# Patient Record
Sex: Female | Born: 1935
Health system: Southern US, Community
[De-identification: ages and names within clinical notes are randomized; demographics above are authoritative.]

## PROBLEM LIST (undated history)

## (undated) DIAGNOSIS — I4891 Unspecified atrial fibrillation: Secondary | ICD-10-CM

## (undated) DIAGNOSIS — M159 Polyosteoarthritis, unspecified: Secondary | ICD-10-CM

## (undated) DIAGNOSIS — F329 Major depressive disorder, single episode, unspecified: Secondary | ICD-10-CM

## (undated) DIAGNOSIS — M069 Rheumatoid arthritis, unspecified: Secondary | ICD-10-CM

## (undated) DIAGNOSIS — K8020 Calculus of gallbladder without cholecystitis without obstruction: Secondary | ICD-10-CM

## (undated) DIAGNOSIS — I10 Essential (primary) hypertension: Secondary | ICD-10-CM

## (undated) DIAGNOSIS — R9431 Abnormal electrocardiogram [ECG] [EKG]: Secondary | ICD-10-CM

## (undated) DIAGNOSIS — I350 Nonrheumatic aortic (valve) stenosis: Secondary | ICD-10-CM

## (undated) DIAGNOSIS — J189 Pneumonia, unspecified organism: Secondary | ICD-10-CM

## (undated) DIAGNOSIS — R7301 Impaired fasting glucose: Secondary | ICD-10-CM

## (undated) DIAGNOSIS — E785 Hyperlipidemia, unspecified: Secondary | ICD-10-CM

## (undated) DIAGNOSIS — R079 Chest pain, unspecified: Secondary | ICD-10-CM

## (undated) DIAGNOSIS — R609 Edema, unspecified: Secondary | ICD-10-CM

## (undated) DIAGNOSIS — F0390 Unspecified dementia without behavioral disturbance: Secondary | ICD-10-CM

## (undated) DIAGNOSIS — R42 Dizziness and giddiness: Secondary | ICD-10-CM

## (undated) HISTORY — DX: Nonrheumatic aortic (valve) stenosis: I35.0

## (undated) HISTORY — DX: Impaired fasting glucose: R73.01

## (undated) HISTORY — DX: Polyosteoarthritis, unspecified: M15.9

## (undated) HISTORY — PX: CHOLECYSTECTOMY: SHX55

## (undated) HISTORY — DX: Hyperlipidemia, unspecified: E78.5

## (undated) HISTORY — PX: BUNIONECTOMY: SHX129

## (undated) HISTORY — PX: OTHER SURGICAL HISTORY: SHX169

## (undated) HISTORY — DX: Essential (primary) hypertension: I10

## (undated) HISTORY — DX: Abnormal electrocardiogram (ECG) (EKG): R94.31

## (undated) HISTORY — DX: Unspecified atrial fibrillation: I48.91

## (undated) HISTORY — DX: Dizziness and giddiness: R42

## (undated) HISTORY — PX: APPENDECTOMY: SHX54

## (undated) HISTORY — DX: Edema, unspecified: R60.9

## (undated) HISTORY — DX: Chest pain, unspecified: R07.9

## (undated) HISTORY — PX: TONSILLECTOMY: SUR1361

---
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
## 1997-06-08 ENCOUNTER — Ambulatory Visit (HOSPITAL_COMMUNITY): Admission: RE | Admit: 1997-06-08 | Discharge: 1997-06-08 | Payer: Self-pay | Admitting: Family Medicine

## 1998-06-02 ENCOUNTER — Other Ambulatory Visit: Admission: RE | Admit: 1998-06-02 | Discharge: 1998-06-02 | Payer: Self-pay | Admitting: Family Medicine

## 1998-06-21 ENCOUNTER — Ambulatory Visit (HOSPITAL_COMMUNITY): Admission: RE | Admit: 1998-06-21 | Discharge: 1998-06-21 | Payer: Self-pay | Admitting: Family Medicine

## 1998-06-21 ENCOUNTER — Encounter: Payer: Self-pay | Admitting: Family Medicine

## 1999-06-22 ENCOUNTER — Encounter: Payer: Self-pay | Admitting: Family Medicine

## 1999-06-22 ENCOUNTER — Ambulatory Visit (HOSPITAL_COMMUNITY): Admission: RE | Admit: 1999-06-22 | Discharge: 1999-06-22 | Payer: Self-pay | Admitting: Family Medicine

## 1999-08-11 ENCOUNTER — Emergency Department (HOSPITAL_COMMUNITY): Admission: EM | Admit: 1999-08-11 | Discharge: 1999-08-11 | Payer: Self-pay | Admitting: *Deleted

## 1999-08-15 ENCOUNTER — Other Ambulatory Visit: Admission: RE | Admit: 1999-08-15 | Discharge: 1999-08-15 | Payer: Self-pay | Admitting: Family Medicine

## 2001-08-25 ENCOUNTER — Encounter: Payer: Self-pay | Admitting: Internal Medicine

## 2001-08-25 ENCOUNTER — Ambulatory Visit (HOSPITAL_COMMUNITY): Admission: RE | Admit: 2001-08-25 | Discharge: 2001-08-25 | Payer: Self-pay | Admitting: Internal Medicine

## 2002-10-09 ENCOUNTER — Encounter: Payer: Self-pay | Admitting: Internal Medicine

## 2002-10-09 ENCOUNTER — Ambulatory Visit (HOSPITAL_COMMUNITY): Admission: RE | Admit: 2002-10-09 | Discharge: 2002-10-09 | Payer: Self-pay | Admitting: Internal Medicine

## 2003-03-11 ENCOUNTER — Emergency Department (HOSPITAL_COMMUNITY): Admission: EM | Admit: 2003-03-11 | Discharge: 2003-03-11 | Payer: Self-pay | Admitting: Emergency Medicine

## 2003-10-11 ENCOUNTER — Ambulatory Visit (HOSPITAL_COMMUNITY): Admission: RE | Admit: 2003-10-11 | Discharge: 2003-10-11 | Payer: Self-pay | Admitting: Internal Medicine

## 2004-11-03 ENCOUNTER — Ambulatory Visit (HOSPITAL_COMMUNITY): Admission: RE | Admit: 2004-11-03 | Discharge: 2004-11-03 | Payer: Self-pay | Admitting: Internal Medicine

## 2005-11-14 ENCOUNTER — Ambulatory Visit (HOSPITAL_COMMUNITY): Admission: RE | Admit: 2005-11-14 | Discharge: 2005-11-14 | Payer: Self-pay | Admitting: Internal Medicine

## 2006-12-18 ENCOUNTER — Ambulatory Visit (HOSPITAL_COMMUNITY): Admission: RE | Admit: 2006-12-18 | Discharge: 2006-12-18 | Payer: Self-pay | Admitting: Internal Medicine

## 2007-01-24 ENCOUNTER — Encounter: Admission: RE | Admit: 2007-01-24 | Discharge: 2007-01-24 | Payer: Self-pay | Admitting: Internal Medicine

## 2007-02-05 ENCOUNTER — Ambulatory Visit: Payer: Self-pay | Admitting: Vascular Surgery

## 2007-04-03 ENCOUNTER — Inpatient Hospital Stay (HOSPITAL_COMMUNITY): Admission: EM | Admit: 2007-04-03 | Discharge: 2007-04-04 | Payer: Self-pay | Admitting: Emergency Medicine

## 2007-12-22 ENCOUNTER — Ambulatory Visit (HOSPITAL_COMMUNITY): Admission: RE | Admit: 2007-12-22 | Discharge: 2007-12-22 | Payer: Self-pay | Admitting: Internal Medicine

## 2009-01-12 ENCOUNTER — Ambulatory Visit (HOSPITAL_COMMUNITY): Admission: RE | Admit: 2009-01-12 | Discharge: 2009-01-12 | Payer: Self-pay | Admitting: Pediatrics

## 2010-01-13 ENCOUNTER — Ambulatory Visit (HOSPITAL_COMMUNITY)
Admission: RE | Admit: 2010-01-13 | Discharge: 2010-01-13 | Payer: Self-pay | Source: Home / Self Care | Admitting: Internal Medicine

## 2010-07-04 NOTE — H&P (Signed)
NAMEMAKINI, DEMAINE NO.:  [id number]   MEDICAL RECORD NO.:  [id number]          PATIENT TYPE:  INP   LOCATION:  [id number]                         FACILITY:  [facility]   PHYSICIAN:  Bartholomew Boards, MD      DATE OF BIRTH:  1928-12-07   DATE OF ADMISSION:  04/02/2007  DATE OF DISCHARGE:                              HISTORY & PHYSICAL   PRIMARY CARE PHYSICIAN:  Merrilee Seashore.   ADMITTING SERVICE:  [employer] [facility] Team [facility]   CHIEF COMPLAINT:  Chest pain.   HISTORY OF PRESENT ILLNESS:  Ms. Coverdale is a 77 year old African American  female with history of hypertension who presents to the [facility]  Emergency Department this morning complaining of chest pain.  She  reports the pain is a pressure-like pain of her left anterior chest.  She reports this is associated with exertion and resolves spontaneously  and at rest.  She says it also has occurred at rest and resolves  spontaneously.  When it does recur, it reports it has been moderate and  not associated with shortness of breath, nausea, vomiting, diaphoresis,  or paresthesia of her extremities.  She currently has no chest pain.  She denies ever having symptoms similar to this previously.  Furthermore, she denies having had any history of heart disease to her  knowledge.  Additionally, she denies fever, chills, dysuria, or cough  but she reports she has had left lower extremity edema for over 4 weeks  now since having broken out with a rash in her left leg that has been  followed by dermatology.  Currently, she has no rash but continued  edema.   PAST MEDICAL HISTORY:  1. Hypertension.  2. Gallstones.   FAMILY HISTORY:  Positive for hypertension.  Negative for coronary  artery disease.   SOCIAL HISTORY:  Patient lives alone in [place], [place].  Denies tobacco, alcohol, or drug use.   ALLERGIES:  CODEINE.   MEDICATIONS:  1. Hydrochlorothiazide 25 mg p.o. daily.  2. Fish oil.  3.  Os-Cal.  4. Aspirin 81 mg daily.   REVIEW OF SYSTEMS:  As per history of present illness.  Also, a 14-point  review of systems was obtained and negative.   PHYSICAL EXAM:  VITAL SIGNS:  Temperature 97.2.  Pulse 87.  Respirations  18.  Blood pressure 134/32.  GENERAL:  Well-nourished, well-developed [place] American female.  Appears stated age, no acute distress.  HEENT:  Extraocular movements intact.  Oropharynx clear.  Normocephalic,  atraumatic.  NECK:  Supple without jugular venous distention.  CHEST:  Clear to auscultation bilaterally without rales, rhonchi, or  wheeze.  HEART:  Regular rate and rhythm with systolic ejection murmur but no  rubs or gallops appreciated.  ABDOMEN:  Obese, nontender.  Positive bowel sounds.  No  hepatosplenomegaly.  No masses appreciated.  EXTREMITIES:  One-plus left lower extremity pretibial edema without  wound visualized on the left lower extremity.  No right lower extremity,  no other edema, normal range of motion.  SKIN:  No rashes apparent on exam.  NEUROLOGIC:  Grossly intact.  LABS AND STUDIES:  EKG shows normal sinus rhythm at 84 with occasional  PACs, but no ST or T-wave changes.   Chest x-ray shows shallow inspirations with left greater than right  bibasilar opacities concerning for atelectasis versus aspiration versus  pneumonia.  Chest x-ray shows cardiomegaly without pulmonary edema.  Metabolic panel and complete blood count blood tests are within normal  limits.  Troponin I is less than 0.05.   IMPRESSION:  A 77 year old African American female with chest pain.   PLAN:  We will admit the patient to [facility].  We will place on telemetry bed.  Rule out myocardial infarction with  serial cardiac enzymes.  Treat with primary prevention with baby  aspirin.  Check fasting lipid panel.  Repeat labs later this morning.  We will control blood pressure with her home hydrochlorothiazide dose  and add additional  medications as indicated.      Bartholomew Boards, MD  Electronically Signed     EWR/MEDQ  D:  04/03/2007  T:  04/04/2007  Job:  [id number]

## 2010-07-04 NOTE — Discharge Summary (Signed)
[name] [name], [name] [name]                ACCOUNT NO.:  [id number]   MEDICAL RECORD NO.:  [id number]          PATIENT TYPE:  INP   LOCATION:  [id number]                         FACILITY:  [facility]   PHYSICIAN:  Rexene Alberts, M.D.    DATE OF BIRTH:  07/04/1928   DATE OF ADMISSION:  04/02/2007  DATE OF DISCHARGE:  04/04/2007                               DISCHARGE SUMMARY   DISCHARGE DIAGNOSES:  1. Atypical chest pain.  Myocardial infarction ruled out.  2. Sinus bradycardia with a heart rate of 54 beats per minute.  3. Hypertension.   DISCHARGE MEDICATIONS:  1. Hydrochlorothiazide 25 mg daily.  2. Fish oil capsule once or twice daily.  3. Os-Cal 500 mg b.i.d.  4. Aspirin 81 mg daily.   DISCHARGE DISPOSITION:  The patient is being discharged to home in  improved and stable condition.  She was advised to follow up with her  primary care physician, Dr. Ashby Dawes, in 1 week.   CONSULTATIONS:  None.   PROCEDURE PERFORMED:  Chest x-ray on April 03, 2007.  The results  revealed shallow lung volumes with bibasilar opacity, left greater than  right.  Cardiomegaly without acute pulmonary edema.   HISTORY OF PRESENT ILLNESS:  The patient is a 77 year old woman with a  past medical history significant for hypertension who presented to the  emergency department on January 31, 2008 with a chief complaint of  chest pain.  The chest pain was not associated with shortness of breath,  nausea, vomiting, diaphoresis, or paresthesias of her extremities.  When  she was evaluated in the emergency department she had become chest pain  free.  Her EKG on admission revealed normal sinus rhythm with PAC and a  heart rate of 84 beats per minute.  Her initial cardiac markers were  negative.  The patient, however, was admitted for further evaluation and  management.   For additional details, please see the dictated history and physical.   HOSPITAL COURSE:  Problem 1.  CHEST PAIN.  The the patient was continued  on hydrochlorothiazide and aspirin.  Pain management was started with as-  needed Tylenol and as-needed morphine.  Prophylactic Protonix and  Lovenox were given as well.  Cardiac enzymes were ordered every 8 hours  x24 hours for further evaluation.  The patient's cardiac enzymes were  completely within normal limits during the hospital course.  A followup  EKG was ordered as well and revealed sinus bradycardia with a heart rate  of 54 beats per minute but no ST or T-wave abnormalities.  Her thyroid  function and fasting lipid panel were assessed as well.  The TSH was  within normal limits at 4.8.  Her fasting lipid panel revealed a total  cholesterol of 179, HDL of 46, LDL of 117, and triglycerides of 79.   The patient experienced no chest pain during the entire hospitalization.  I discussed her disposition with her primary care physician Dr.  Ashby Dawes.  I advised further evaluation with an outpatient  Cardiolite or Myoveiw stress test,  given that the patient is now asymptomatic and her cardiac enzymes are  normal.  He agreed.  I discussed this with the patient and the patient's  family, and they are all in agreement with further outpatient  evaluation.  The patient was advised to continue her chronic medications  including hydrochlorothiazide, fish oil, and aspirin.      Rexene Alberts, M.D.  Electronically Signed     DF/MEDQ  D:  04/04/2007  T:  04/06/2007  Job:  [id number]   cc:   Merrilee Seashore, M.D.  Fax: [phone]

## 2010-11-10 LAB — BASIC METABOLIC PANEL
BUN: 21
CO2: 28
Calcium: 8.9
Chloride: 98
Creatinine, Ser: 0.88
GFR calc Af Amer: >60
GFR calc non Af Amer: >60
Glucose, Bld: 117 — ABNORMAL HIGH
Potassium: 3.9
Sodium: 133 — ABNORMAL LOW

## 2010-11-10 LAB — TROPONIN I: Troponin I: 0.04

## 2010-11-10 LAB — COMPREHENSIVE METABOLIC PANEL
ALT: 15
AST: 21
Albumin: 3 — ABNORMAL LOW
Alkaline Phosphatase: 35 — ABNORMAL LOW
BUN: 17
CO2: 30
Calcium: 9.1
Chloride: 102
Creatinine, Ser: 0.97
GFR calc Af Amer: >60
GFR calc non Af Amer: 56 — ABNORMAL LOW
Glucose, Bld: 115 — ABNORMAL HIGH
Potassium: 4.1
Sodium: 138
Total Bilirubin: 0.6
Total Protein: 6.8

## 2010-11-10 LAB — CBC
HCT: 31.9 — ABNORMAL LOW
HCT: 32.6 — ABNORMAL LOW
Hemoglobin: 11.1 — ABNORMAL LOW
Hemoglobin: 11.4 — ABNORMAL LOW
MCHC: 34.9
MCHC: 34.9
MCV: 90.9
MCV: 91.3
Platelets: 291
Platelets: 301
RBC: 3.51 — ABNORMAL LOW
RBC: 3.57 — ABNORMAL LOW
RDW: 13.5
RDW: 13.6
WBC: 5.8
WBC: 7.4

## 2010-11-10 LAB — DIFFERENTIAL
Basophils Absolute: 0
Basophils Relative: 0
Eosinophils Absolute: 0.2
Eosinophils Relative: 3
Lymphocytes Relative: 25
Lymphs Abs: 1.9
Monocytes Absolute: 0.8
Monocytes Relative: 10
Neutro Abs: 4.5
Neutrophils Relative %: 61

## 2010-11-10 LAB — CARDIAC PANEL(CRET KIN+CKTOT+MB+TROPI)
CK, MB: 1.9
CK, MB: 2.3
Relative Index: 1
Relative Index: 1.4
Total CK: 159
Total CK: 198 — ABNORMAL HIGH
Troponin I: 0.03
Troponin I: 0.04

## 2010-11-10 LAB — LIPID PANEL
Cholesterol: 179
HDL: 46
LDL Cholesterol: 117 — ABNORMAL HIGH
Total CHOL/HDL Ratio: 3.9
Triglycerides: 79
VLDL: 16

## 2010-11-10 LAB — TSH: TSH: 4.831

## 2010-11-10 LAB — POCT CARDIAC MARKERS
CKMB, poc: 2.6
Myoglobin, poc: 88.5
Operator id: 4531
Troponin i, poc: <0.05

## 2010-11-10 LAB — CK TOTAL AND CKMB (NOT AT ARMC)
CK, MB: 2.8
Relative Index: 1.4
Total CK: 199 — ABNORMAL HIGH

## 2010-11-10 LAB — B-NATRIURETIC PEPTIDE (CONVERTED LAB): Pro B Natriuretic peptide (BNP): <30

## 2010-12-19 ENCOUNTER — Other Ambulatory Visit (HOSPITAL_COMMUNITY): Payer: Self-pay | Admitting: Internal Medicine

## 2010-12-19 DIAGNOSIS — Z1231 Encounter for screening mammogram for malignant neoplasm of breast: Secondary | ICD-10-CM

## 2011-01-17 ENCOUNTER — Ambulatory Visit (HOSPITAL_COMMUNITY)
Admission: RE | Admit: 2011-01-17 | Discharge: 2011-01-17 | Disposition: A | Discharge status: Home / Self Care | Payer: Medicare Other | Source: Ambulatory Visit | Attending: Internal Medicine | Admitting: Internal Medicine

## 2011-01-17 DIAGNOSIS — Z1231 Encounter for screening mammogram for malignant neoplasm of breast: Secondary | ICD-10-CM | POA: Insufficient documentation

## 2011-04-11 DIAGNOSIS — M069 Rheumatoid arthritis, unspecified: Secondary | ICD-10-CM | POA: Diagnosis not present

## 2011-06-05 DIAGNOSIS — R609 Edema, unspecified: Secondary | ICD-10-CM | POA: Diagnosis not present

## 2011-06-05 DIAGNOSIS — I10 Essential (primary) hypertension: Secondary | ICD-10-CM | POA: Diagnosis not present

## 2011-06-05 DIAGNOSIS — E669 Obesity, unspecified: Secondary | ICD-10-CM | POA: Diagnosis not present

## 2011-06-05 DIAGNOSIS — E785 Hyperlipidemia, unspecified: Secondary | ICD-10-CM | POA: Diagnosis not present

## 2011-06-05 DIAGNOSIS — M069 Rheumatoid arthritis, unspecified: Secondary | ICD-10-CM | POA: Diagnosis not present

## 2011-06-12 DIAGNOSIS — E782 Mixed hyperlipidemia: Secondary | ICD-10-CM | POA: Diagnosis not present

## 2011-06-12 DIAGNOSIS — M159 Polyosteoarthritis, unspecified: Secondary | ICD-10-CM | POA: Diagnosis not present

## 2011-06-12 DIAGNOSIS — I359 Nonrheumatic aortic valve disorder, unspecified: Secondary | ICD-10-CM | POA: Diagnosis not present

## 2011-06-12 DIAGNOSIS — H908 Mixed conductive and sensorineural hearing loss, unspecified: Secondary | ICD-10-CM | POA: Diagnosis not present

## 2011-06-12 DIAGNOSIS — I10 Essential (primary) hypertension: Secondary | ICD-10-CM | POA: Diagnosis not present

## 2011-06-12 DIAGNOSIS — R609 Edema, unspecified: Secondary | ICD-10-CM | POA: Diagnosis not present

## 2011-07-31 DIAGNOSIS — M069 Rheumatoid arthritis, unspecified: Secondary | ICD-10-CM | POA: Diagnosis not present

## 2011-07-31 DIAGNOSIS — M79609 Pain in unspecified limb: Secondary | ICD-10-CM | POA: Diagnosis not present

## 2011-07-31 DIAGNOSIS — M159 Polyosteoarthritis, unspecified: Secondary | ICD-10-CM | POA: Diagnosis not present

## 2011-10-30 DIAGNOSIS — M159 Polyosteoarthritis, unspecified: Secondary | ICD-10-CM | POA: Diagnosis not present

## 2011-10-30 DIAGNOSIS — M069 Rheumatoid arthritis, unspecified: Secondary | ICD-10-CM | POA: Diagnosis not present

## 2011-12-10 DIAGNOSIS — M159 Polyosteoarthritis, unspecified: Secondary | ICD-10-CM | POA: Diagnosis not present

## 2011-12-10 DIAGNOSIS — R609 Edema, unspecified: Secondary | ICD-10-CM | POA: Diagnosis not present

## 2011-12-10 DIAGNOSIS — E782 Mixed hyperlipidemia: Secondary | ICD-10-CM | POA: Diagnosis not present

## 2011-12-10 DIAGNOSIS — I10 Essential (primary) hypertension: Secondary | ICD-10-CM | POA: Diagnosis not present

## 2011-12-17 DIAGNOSIS — E782 Mixed hyperlipidemia: Secondary | ICD-10-CM | POA: Diagnosis not present

## 2011-12-17 DIAGNOSIS — M159 Polyosteoarthritis, unspecified: Secondary | ICD-10-CM | POA: Diagnosis not present

## 2011-12-17 DIAGNOSIS — R9431 Abnormal electrocardiogram [ECG] [EKG]: Secondary | ICD-10-CM | POA: Diagnosis not present

## 2011-12-17 DIAGNOSIS — I10 Essential (primary) hypertension: Secondary | ICD-10-CM | POA: Diagnosis not present

## 2011-12-17 DIAGNOSIS — Z23 Encounter for immunization: Secondary | ICD-10-CM | POA: Diagnosis not present

## 2011-12-17 DIAGNOSIS — H2510 Age-related nuclear cataract, unspecified eye: Secondary | ICD-10-CM | POA: Diagnosis not present

## 2011-12-28 ENCOUNTER — Other Ambulatory Visit (HOSPITAL_COMMUNITY): Payer: Self-pay | Admitting: Internal Medicine

## 2011-12-28 DIAGNOSIS — Z1231 Encounter for screening mammogram for malignant neoplasm of breast: Secondary | ICD-10-CM

## 2012-01-18 ENCOUNTER — Ambulatory Visit (HOSPITAL_COMMUNITY)
Admission: RE | Admit: 2012-01-18 | Discharge: 2012-01-18 | Disposition: A | Discharge status: Home / Self Care | Payer: Medicare Other | Source: Ambulatory Visit | Attending: Internal Medicine | Admitting: Internal Medicine

## 2012-01-18 DIAGNOSIS — Z1231 Encounter for screening mammogram for malignant neoplasm of breast: Secondary | ICD-10-CM | POA: Insufficient documentation

## 2012-01-29 DIAGNOSIS — M159 Polyosteoarthritis, unspecified: Secondary | ICD-10-CM | POA: Diagnosis not present

## 2012-01-29 DIAGNOSIS — M069 Rheumatoid arthritis, unspecified: Secondary | ICD-10-CM | POA: Diagnosis not present

## 2012-06-09 DIAGNOSIS — Z780 Asymptomatic menopausal state: Secondary | ICD-10-CM | POA: Diagnosis not present

## 2012-06-09 DIAGNOSIS — M159 Polyosteoarthritis, unspecified: Secondary | ICD-10-CM | POA: Diagnosis not present

## 2012-06-09 DIAGNOSIS — I10 Essential (primary) hypertension: Secondary | ICD-10-CM | POA: Diagnosis not present

## 2012-06-09 DIAGNOSIS — E782 Mixed hyperlipidemia: Secondary | ICD-10-CM | POA: Diagnosis not present

## 2012-06-09 DIAGNOSIS — Z0000 Encounter for general adult medical examination without abnormal findings: Secondary | ICD-10-CM | POA: Diagnosis not present

## 2012-06-17 DIAGNOSIS — M069 Rheumatoid arthritis, unspecified: Secondary | ICD-10-CM | POA: Diagnosis not present

## 2012-06-17 DIAGNOSIS — M159 Polyosteoarthritis, unspecified: Secondary | ICD-10-CM | POA: Diagnosis not present

## 2012-06-23 DIAGNOSIS — I359 Nonrheumatic aortic valve disorder, unspecified: Secondary | ICD-10-CM | POA: Diagnosis not present

## 2012-06-23 DIAGNOSIS — R7301 Impaired fasting glucose: Secondary | ICD-10-CM | POA: Diagnosis not present

## 2012-06-23 DIAGNOSIS — Z780 Asymptomatic menopausal state: Secondary | ICD-10-CM | POA: Diagnosis not present

## 2012-06-23 DIAGNOSIS — I10 Essential (primary) hypertension: Secondary | ICD-10-CM | POA: Diagnosis not present

## 2012-06-23 DIAGNOSIS — R9431 Abnormal electrocardiogram [ECG] [EKG]: Secondary | ICD-10-CM | POA: Diagnosis not present

## 2012-06-23 DIAGNOSIS — H908 Mixed conductive and sensorineural hearing loss, unspecified: Secondary | ICD-10-CM | POA: Diagnosis not present

## 2012-07-13 ENCOUNTER — Encounter: Payer: Self-pay | Admitting: *Deleted

## 2012-07-19 ENCOUNTER — Encounter: Payer: Self-pay | Admitting: Cardiovascular Disease

## 2012-07-21 ENCOUNTER — Ambulatory Visit (HOSPITAL_COMMUNITY)
Admission: RE | Admit: 2012-07-21 | Discharge: 2012-07-21 | Disposition: A | Discharge status: Home / Self Care | Payer: Medicare Other | Source: Ambulatory Visit | Attending: Cardiovascular Disease | Admitting: Cardiovascular Disease

## 2012-07-21 ENCOUNTER — Ambulatory Visit (HOSPITAL_BASED_OUTPATIENT_CLINIC_OR_DEPARTMENT_OTHER)
Admission: RE | Admit: 2012-07-21 | Discharge: 2012-07-21 | Disposition: A | Discharge status: Home / Self Care | Payer: Medicare Other | Source: Ambulatory Visit | Attending: Cardiovascular Disease | Admitting: Cardiovascular Disease

## 2012-07-21 ENCOUNTER — Encounter: Payer: Self-pay | Admitting: Cardiovascular Disease

## 2012-07-21 ENCOUNTER — Ambulatory Visit (INDEPENDENT_AMBULATORY_CARE_PROVIDER_SITE_OTHER): Payer: Medicare Other | Admitting: Cardiovascular Disease

## 2012-07-21 VITALS — BP 138/78 | HR 67 | Ht 66.0 in | Wt 227.0 lb

## 2012-07-21 DIAGNOSIS — I379 Nonrheumatic pulmonary valve disorder, unspecified: Secondary | ICD-10-CM | POA: Insufficient documentation

## 2012-07-21 DIAGNOSIS — R079 Chest pain, unspecified: Secondary | ICD-10-CM

## 2012-07-21 DIAGNOSIS — R0989 Other specified symptoms and signs involving the circulatory and respiratory systems: Secondary | ICD-10-CM

## 2012-07-21 DIAGNOSIS — I10 Essential (primary) hypertension: Secondary | ICD-10-CM

## 2012-07-21 DIAGNOSIS — I491 Atrial premature depolarization: Secondary | ICD-10-CM

## 2012-07-21 DIAGNOSIS — I359 Nonrheumatic aortic valve disorder, unspecified: Secondary | ICD-10-CM | POA: Insufficient documentation

## 2012-07-21 DIAGNOSIS — R011 Cardiac murmur, unspecified: Secondary | ICD-10-CM | POA: Insufficient documentation

## 2012-07-21 DIAGNOSIS — I079 Rheumatic tricuspid valve disease, unspecified: Secondary | ICD-10-CM | POA: Insufficient documentation

## 2012-07-21 DIAGNOSIS — I517 Cardiomegaly: Secondary | ICD-10-CM | POA: Diagnosis not present

## 2012-07-21 DIAGNOSIS — E785 Hyperlipidemia, unspecified: Secondary | ICD-10-CM

## 2012-07-21 DIAGNOSIS — I059 Rheumatic mitral valve disease, unspecified: Secondary | ICD-10-CM | POA: Diagnosis not present

## 2012-07-21 DIAGNOSIS — I350 Nonrheumatic aortic (valve) stenosis: Secondary | ICD-10-CM

## 2012-07-21 NOTE — Progress Notes (Signed)
Carotid Duplex Completed. Maddax Palinkas, RDMS, RVT  

## 2012-07-21 NOTE — Progress Notes (Signed)
2D Echo Performed 07/21/2012    Marygrace Drought, RCS

## 2012-07-21 NOTE — Patient Instructions (Signed)
Your physician has requested that you have an echocardiogram. Echocardiography is a painless test that uses sound waves to create images of your heart. It provides your doctor with information about the size and shape of your heart and how well your heart's chambers and valves are working. This procedure takes approximately one hour. There are no restrictions for this procedure.  Your physician has requested that you have a carotid duplex. This test is an ultrasound of the carotid arteries in your neck. It looks at blood flow through these arteries that supply the brain with blood. Allow one hour for this exam. There are no restrictions or special instructions.  Your physician recommends that you schedule a follow-up appointment in: 1-2 months, after tests are completed

## 2012-07-22 ENCOUNTER — Encounter: Payer: Self-pay | Admitting: Cardiovascular Disease

## 2012-07-22 DIAGNOSIS — R079 Chest pain, unspecified: Secondary | ICD-10-CM | POA: Insufficient documentation

## 2012-07-22 DIAGNOSIS — I491 Atrial premature depolarization: Secondary | ICD-10-CM | POA: Insufficient documentation

## 2012-07-22 DIAGNOSIS — R0989 Other specified symptoms and signs involving the circulatory and respiratory systems: Secondary | ICD-10-CM | POA: Insufficient documentation

## 2012-07-22 DIAGNOSIS — E785 Hyperlipidemia, unspecified: Secondary | ICD-10-CM | POA: Insufficient documentation

## 2012-07-22 DIAGNOSIS — I350 Nonrheumatic aortic (valve) stenosis: Secondary | ICD-10-CM | POA: Insufficient documentation

## 2012-07-22 DIAGNOSIS — I10 Essential (primary) hypertension: Secondary | ICD-10-CM | POA: Insufficient documentation

## 2012-07-22 HISTORY — DX: Atrial premature depolarization: I49.1

## 2012-07-22 HISTORY — DX: Other specified symptoms and signs involving the circulatory and respiratory systems: R09.89

## 2012-07-22 NOTE — Assessment & Plan Note (Signed)
Bilateral carotid bruits are very loud. Although they may be radiating from the chest I believe it is important to screen for significant carotid stenosis at least once. She scheduled for duplex ultrasonography.

## 2012-07-22 NOTE — Assessment & Plan Note (Signed)
These are asymptomatic and likely to be benign, but it is important to ensure that she has not developed progressive structural heart disease causing these. Recommendation of a followup echocardiogram. At least by physical exam her aortic stenosis sounds more than mild. It is hard to be very confident that she's truly asymptomatic because of her memory problems. We may have to confer with her family.

## 2012-07-22 NOTE — Assessment & Plan Note (Signed)
Physical exam this is at least moderate. She has not had an echocardiogram in over 5 years and I believe this should be reviewed. She will followup after undergoing the echocardiogram and carotid artery ultrasound.

## 2012-07-22 NOTE — Progress Notes (Signed)
Patient ID: [name] [name], female   DOB: 1928-09-29, 79 y.o.   MRN: [id number]  Reason for office visit [name] [name] is referred in consultation by Dr.Ramachandran for new onset very frequent premature atrial contractions and aortic stenosis  Mrs. history he is a very pleasant and cooperative but I believe she may have some memory problems. The history is obtained from the patient also from the chart. Actually her chart does document a history of mild dementia. I believe this etc. report regarding her symptoms and previous medical problems less reliable.  She saw her primary care physician couple of weeks ago and was noted to have very frequent premature short complexes. She is reasonably active and does not describe any complaints of chest pain, dyspnea or dizziness with activity. She denies problems of lower showed edema and is unaware of palpitations. She has treated hyperlipidemia hypertension and has an elevated hemoglobin A1c at 6.6%. She is not on any medications for diabetes. She takes chronic treatment with methotrexate (I believe for rheumatoid arthritis, but the patient cannot tell me the exact diagnosis). She does see Dr. Amil Amen.  In 2009 an echocardiogram was performed that showed normal left ventricular systolic function and mild aortic valve stenosis the aortic valve peak gradient was reported as 38 mm of mercury and the estimated rebound area was 1.1 cm square. Of note there was no evidence of left ventricular hypertrophy or left atrial enlargement. A nuclear stress test performed around the same time actually showed a reversible inferior and inferolateral defect but to my knowledge followup angiography was never performed. She denies chest pain.  Allergies  Allergen Reactions  . Codeine Nausea And Vomiting and Other (See Comments)    Dizziness    Current Outpatient Prescriptions  Medication Sig Dispense Refill  . calcium-vitamin D (OSCAL WITH D) 500-200 MG-UNIT per tablet Take 1 tablet  by mouth daily.      . fish oil-omega-3 fatty acids 1000 MG capsule Take 1 g by mouth daily.      . folic acid (FOLVITE) 1 MG tablet Take 1 mg by mouth daily.      . hydrochlorothiazide (HYDRODIURIL) 25 MG tablet Take 25 mg by mouth daily.      . methotrexate (RHEUMATREX) 2.5 MG tablet Take 2.5 mg by mouth once a week. 6 tablet all at once Caution:Chemotherapy. Protect from light.      . lovastatin (MEVACOR) 40 MG tablet Take 40 mg by mouth daily.       No current facility-administered medications for this visit.    Past Medical History  Diagnosis Date  . HTN (hypertension)   . Chest pain     myoview 04/24/07-mild-mod perfusion defect with mild-mod superimposed ischemiain the mid inferior, apicla inferior, basal inferolateral and mid inferolateral regions    Past Surgical History  Procedure Laterality Date  . Cholecystectomy  1970s    Family History  Problem Relation Age of Onset  . Cancer Sister   . Cancer Sister   . Diabetes Father     History   Social History  . Marital Status: Divorced    Spouse Name: N/A    Number of Children: N/A  . Years of Education: N/A   Occupational History  . Not on file.   Social History Main Topics  . Smoking status: Never Smoker   . Smokeless tobacco: Never Used  . Alcohol Use: No  . Drug Use: No  . Sexually Active: Not on file   Other Topics Concern  .  Not on file   Social History Narrative  . No narrative on file    Review of systems: The patient specifically denies any chest pain at rest or exertion, dyspnea at rest or with exertion, orthopnea, paroxysmal nocturnal dyspnea, syncope, palpitations, focal neurological deficits, intermittent claudication, lower extremity edema, unexplained weight gain, cough, hemoptysis or wheezing. She also denies fever chills night sweats or major weight changes. No complaints of abdominal pain, dysphagia, nausea, vomiting, diarrhea, constipation, melena or bright red blood per rectum. No  complaints of hematuria dysuria polyuria polydipsia, frequency, urgency, intolerance to heat or cold, change in mood, change in skin texture, rashes or allergic reactions. Denies easy bleeding or bruising  PHYSICAL EXAM BP 138/78  Pulse 67  Ht 5\' 6"  (1.676 m)  Wt 227 lb (102.967 kg)  BMI 36.66 kg/m2 General appearance: alert, cooperative and no distress Neck: no adenopathy, no JVD, supple, symmetrical, trachea midline, thyroid not enlarged, symmetric, no tenderness/mass/nodules and Very loud bilateral carotid bruits Lungs: clear to auscultation bilaterally Heart: normal apical impulse, regular rate and rhythm, S1, S2 normal, S4 present and systolic murmur: systolic ejection 3/6, crescendo and decrescendo at 2nd right intercostal space Abdomen: soft, non-tender; bowel sounds normal; no masses,  no organomegaly Extremities: extremities normal, atraumatic, no cyanosis or edema Pulses: 2+ and symmetric Skin: Skin color, texture, turgor normal. No rashes or lesions Neurologic: Grossly normal obvious short-term memory problems   EKG: Normal sinus rhythm with very frequent premature contractions and first-degree atrioventricular block no significant repolarization abnormalities, no signs of chamber enlargement    Lipid Panel per Dr. Mathis Fare notes cholesterol 155, triglycerides 99, HDL 56, LDL 79 Also noted are hemoglobin A1c of 6.6, fasting glucose of 110, creatinine of 1.2, BUN of 22, "liver function tests are within normal limits"   ASSESSMENT AND PLAN  Premature atrial contractions These are asymptomatic and likely to be benign, but it is important to ensure that she has not developed progressive structural heart disease causing these. Recommendation of a followup echocardiogram. At least by physical exam her aortic stenosis sounds more than mild. It is hard to be very confident that she's truly asymptomatic because of her memory problems. We may have to confer with her  family.  Carotid artery bruit Bilateral carotid bruits are very loud. Although they may be radiating from the chest I believe it is important to screen for significant carotid stenosis at least once. She scheduled for duplex ultrasonography.      Holli Humbles, MD, [facility] [phone] office [phone] pager

## 2012-09-16 DIAGNOSIS — M069 Rheumatoid arthritis, unspecified: Secondary | ICD-10-CM | POA: Diagnosis not present

## 2012-09-16 DIAGNOSIS — M159 Polyosteoarthritis, unspecified: Secondary | ICD-10-CM | POA: Diagnosis not present

## 2012-10-06 DIAGNOSIS — I10 Essential (primary) hypertension: Secondary | ICD-10-CM | POA: Diagnosis not present

## 2012-10-06 DIAGNOSIS — R7301 Impaired fasting glucose: Secondary | ICD-10-CM | POA: Diagnosis not present

## 2012-10-06 DIAGNOSIS — M069 Rheumatoid arthritis, unspecified: Secondary | ICD-10-CM | POA: Diagnosis not present

## 2012-10-13 DIAGNOSIS — E782 Mixed hyperlipidemia: Secondary | ICD-10-CM | POA: Diagnosis not present

## 2012-10-13 DIAGNOSIS — I10 Essential (primary) hypertension: Secondary | ICD-10-CM | POA: Diagnosis not present

## 2012-10-13 DIAGNOSIS — R7301 Impaired fasting glucose: Secondary | ICD-10-CM | POA: Diagnosis not present

## 2012-10-14 DIAGNOSIS — K5900 Constipation, unspecified: Secondary | ICD-10-CM | POA: Diagnosis not present

## 2012-10-14 DIAGNOSIS — Z800 Family history of malignant neoplasm of digestive organs: Secondary | ICD-10-CM | POA: Diagnosis not present

## 2012-10-14 DIAGNOSIS — Z1211 Encounter for screening for malignant neoplasm of colon: Secondary | ICD-10-CM | POA: Diagnosis not present

## 2012-10-14 DIAGNOSIS — R635 Abnormal weight gain: Secondary | ICD-10-CM | POA: Diagnosis not present

## 2012-10-27 DIAGNOSIS — Z86010 Personal history of colonic polyps: Secondary | ICD-10-CM | POA: Diagnosis not present

## 2012-10-27 DIAGNOSIS — Z800 Family history of malignant neoplasm of digestive organs: Secondary | ICD-10-CM | POA: Diagnosis not present

## 2012-10-27 DIAGNOSIS — Z1211 Encounter for screening for malignant neoplasm of colon: Secondary | ICD-10-CM | POA: Diagnosis not present

## 2012-10-27 DIAGNOSIS — D126 Benign neoplasm of colon, unspecified: Secondary | ICD-10-CM | POA: Diagnosis not present

## 2012-12-16 ENCOUNTER — Other Ambulatory Visit (HOSPITAL_COMMUNITY): Payer: Self-pay | Admitting: Internal Medicine

## 2012-12-16 DIAGNOSIS — Z1231 Encounter for screening mammogram for malignant neoplasm of breast: Secondary | ICD-10-CM

## 2013-01-12 DIAGNOSIS — H2510 Age-related nuclear cataract, unspecified eye: Secondary | ICD-10-CM | POA: Diagnosis not present

## 2013-01-13 ENCOUNTER — Emergency Department (INDEPENDENT_AMBULATORY_CARE_PROVIDER_SITE_OTHER)
Admission: EM | Admit: 2013-01-13 | Discharge: 2013-01-13 | Disposition: A | Discharge status: Nursing Home (Includes ICF) | Payer: Medicare Other | Source: Home / Self Care | Attending: Emergency Medicine | Admitting: Emergency Medicine

## 2013-01-13 ENCOUNTER — Encounter (HOSPITAL_COMMUNITY): Payer: Self-pay | Admitting: Emergency Medicine

## 2013-01-13 ENCOUNTER — Inpatient Hospital Stay (HOSPITAL_COMMUNITY)
Admission: EM | Admit: 2013-01-13 | Discharge: 2013-01-16 | DRG: 871 | Disposition: A | Discharge status: Home / Self Care | Payer: Medicare Other | Attending: Internal Medicine | Admitting: Internal Medicine

## 2013-01-13 ENCOUNTER — Emergency Department (HOSPITAL_COMMUNITY): Payer: Medicare Other

## 2013-01-13 DIAGNOSIS — J984 Other disorders of lung: Secondary | ICD-10-CM | POA: Diagnosis present

## 2013-01-13 DIAGNOSIS — M069 Rheumatoid arthritis, unspecified: Secondary | ICD-10-CM

## 2013-01-13 DIAGNOSIS — Z9089 Acquired absence of other organs: Secondary | ICD-10-CM | POA: Diagnosis not present

## 2013-01-13 DIAGNOSIS — R0902 Hypoxemia: Secondary | ICD-10-CM | POA: Diagnosis present

## 2013-01-13 DIAGNOSIS — J189 Pneumonia, unspecified organism: Secondary | ICD-10-CM

## 2013-01-13 DIAGNOSIS — I359 Nonrheumatic aortic valve disorder, unspecified: Secondary | ICD-10-CM | POA: Diagnosis not present

## 2013-01-13 DIAGNOSIS — M159 Polyosteoarthritis, unspecified: Secondary | ICD-10-CM | POA: Diagnosis not present

## 2013-01-13 DIAGNOSIS — N390 Urinary tract infection, site not specified: Secondary | ICD-10-CM | POA: Diagnosis present

## 2013-01-13 DIAGNOSIS — Z833 Family history of diabetes mellitus: Secondary | ICD-10-CM | POA: Diagnosis not present

## 2013-01-13 DIAGNOSIS — Z23 Encounter for immunization: Secondary | ICD-10-CM | POA: Diagnosis not present

## 2013-01-13 DIAGNOSIS — A419 Sepsis, unspecified organism: Secondary | ICD-10-CM | POA: Diagnosis not present

## 2013-01-13 DIAGNOSIS — R509 Fever, unspecified: Secondary | ICD-10-CM | POA: Diagnosis not present

## 2013-01-13 DIAGNOSIS — E878 Other disorders of electrolyte and fluid balance, not elsewhere classified: Secondary | ICD-10-CM

## 2013-01-13 DIAGNOSIS — E785 Hyperlipidemia, unspecified: Secondary | ICD-10-CM | POA: Diagnosis not present

## 2013-01-13 DIAGNOSIS — I10 Essential (primary) hypertension: Secondary | ICD-10-CM | POA: Diagnosis present

## 2013-01-13 DIAGNOSIS — J020 Streptococcal pharyngitis: Secondary | ICD-10-CM | POA: Diagnosis present

## 2013-01-13 DIAGNOSIS — E871 Hypo-osmolality and hyponatremia: Secondary | ICD-10-CM | POA: Diagnosis not present

## 2013-01-13 DIAGNOSIS — R112 Nausea with vomiting, unspecified: Secondary | ICD-10-CM

## 2013-01-13 DIAGNOSIS — I350 Nonrheumatic aortic (valve) stenosis: Secondary | ICD-10-CM

## 2013-01-13 HISTORY — DX: Other disorders of electrolyte and fluid balance, not elsewhere classified: E87.8

## 2013-01-13 HISTORY — DX: Calculus of gallbladder without cholecystitis without obstruction: K80.20

## 2013-01-13 HISTORY — DX: Pneumonia, unspecified organism: J18.9

## 2013-01-13 HISTORY — DX: Hypo-osmolality and hyponatremia: E87.1

## 2013-01-13 HISTORY — DX: Rheumatoid arthritis, unspecified: M06.9

## 2013-01-13 LAB — URINALYSIS, ROUTINE W REFLEX MICROSCOPIC
Bilirubin Urine: NEGATIVE
Glucose, UA: NEGATIVE mg/dL
Hgb urine dipstick: NEGATIVE
Ketones, ur: NEGATIVE mg/dL
Nitrite: NEGATIVE
Protein, ur: NEGATIVE mg/dL
Specific Gravity, Urine: 1.018 (ref 1.005–1.030)
Urobilinogen, UA: 0.2 mg/dL (ref 0.0–1.0)
pH: 6 (ref 5.0–8.0)

## 2013-01-13 LAB — CBC WITH DIFFERENTIAL/PLATELET
Basophils Absolute: 0 10*3/uL (ref 0.0–0.1)
Basophils Relative: 0 % (ref 0–1)
Eosinophils Absolute: 0 10*3/uL (ref 0.0–0.7)
Eosinophils Relative: 0 % (ref 0–5)
HCT: 34 % — ABNORMAL LOW (ref 36.0–46.0)
Hemoglobin: 11.7 g/dL — ABNORMAL LOW (ref 12.0–15.0)
Lymphocytes Relative: 8 % — ABNORMAL LOW (ref 12–46)
Lymphs Abs: 0.7 10*3/uL (ref 0.7–4.0)
MCH: 33.3 pg (ref 26.0–34.0)
MCHC: 34.4 g/dL (ref 30.0–36.0)
MCV: 96.9 fL (ref 78.0–100.0)
Monocytes Absolute: 0.7 10*3/uL (ref 0.1–1.0)
Monocytes Relative: 8 % (ref 3–12)
Neutro Abs: 7.8 10*3/uL — ABNORMAL HIGH (ref 1.7–7.7)
Neutrophils Relative %: 84 % — ABNORMAL HIGH (ref 43–77)
Platelets: 195 10*3/uL (ref 150–400)
RBC: 3.51 MIL/uL — ABNORMAL LOW (ref 3.87–5.11)
RDW: 14.3 % (ref 11.5–15.5)
WBC: 9.2 10*3/uL (ref 4.0–10.5)

## 2013-01-13 LAB — COMPREHENSIVE METABOLIC PANEL
ALT: 16 U/L (ref 0–35)
AST: 27 U/L (ref 0–37)
Albumin: 3.4 g/dL — ABNORMAL LOW (ref 3.5–5.2)
Alkaline Phosphatase: 35 U/L — ABNORMAL LOW (ref 39–117)
BUN: 17 mg/dL (ref 6–23)
CO2: 27 mEq/L (ref 19–32)
Calcium: 8.6 mg/dL (ref 8.4–10.5)
Chloride: 92 mEq/L — ABNORMAL LOW (ref 96–112)
Creatinine, Ser: 0.92 mg/dL (ref 0.50–1.10)
GFR calc Af Amer: 65 mL/min — ABNORMAL LOW (ref >90)
GFR calc non Af Amer: 56 mL/min — ABNORMAL LOW (ref >90)
Glucose, Bld: 132 mg/dL — ABNORMAL HIGH (ref 70–99)
Potassium: 4.1 mEq/L (ref 3.5–5.1)
Sodium: 129 mEq/L — ABNORMAL LOW (ref 135–145)
Total Bilirubin: 0.4 mg/dL (ref 0.3–1.2)
Total Protein: 6.9 g/dL (ref 6.0–8.3)

## 2013-01-13 LAB — URINE MICROSCOPIC-ADD ON

## 2013-01-13 LAB — RAPID STREP SCREEN (MED CTR MEBANE ONLY): Streptococcus, Group A Screen (Direct): POSITIVE — AB

## 2013-01-13 LAB — MAGNESIUM: Magnesium: 1.5 mg/dL (ref 1.5–2.5)

## 2013-01-13 LAB — TROPONIN I: Troponin I: <0.3 ng/mL (ref <0.30)

## 2013-01-13 MED ORDER — OMEGA-3 FATTY ACIDS 1000 MG PO CAPS: 1.0000 g | ORAL_CAPSULE | Freq: Every day | ORAL | Status: DC

## 2013-01-13 MED ORDER — PENICILLIN G BENZATHINE 1200000 UNIT/2ML IM SUSP
1.2000 10*6.[IU] | Freq: Once | INTRAMUSCULAR | Status: AC
  Administered 2013-01-13: 1.2 10*6.[IU] via INTRAMUSCULAR
  Filled 2013-01-13: qty 2

## 2013-01-13 MED ORDER — ONDANSETRON HCL 4 MG/2ML IJ SOLN
INTRAMUSCULAR | Status: AC
  Filled 2013-01-13: qty 2

## 2013-01-13 MED ORDER — ONDANSETRON HCL 4 MG/2ML IJ SOLN
4.0000 mg | Freq: Once | INTRAMUSCULAR | Status: AC
  Administered 2013-01-13: 4 mg via INTRAMUSCULAR

## 2013-01-13 MED ORDER — CALCIUM CARBONATE-VITAMIN D 500-200 MG-UNIT PO TABS
1.0000 | ORAL_TABLET | Freq: Every day | ORAL | Status: DC
  Administered 2013-01-14 – 2013-01-16 (×3): 1 via ORAL
  Filled 2013-01-13 (×3): qty 1

## 2013-01-13 MED ORDER — DEXTROSE 5 % IV SOLN
500.0000 mg | Freq: Once | INTRAVENOUS | Status: AC
  Administered 2013-01-13: 500 mg via INTRAVENOUS

## 2013-01-13 MED ORDER — SODIUM CHLORIDE 0.9 % IV BOLUS (SEPSIS)
1000.0000 mL | Freq: Once | INTRAVENOUS | Status: AC
  Administered 2013-01-13: 1000 mL via INTRAVENOUS

## 2013-01-13 MED ORDER — SODIUM CHLORIDE 0.9 % IV SOLN
INTRAVENOUS | Status: AC
  Administered 2013-01-13 (×2): via INTRAVENOUS

## 2013-01-13 MED ORDER — SODIUM CHLORIDE 0.9 % IV SOLN: INTRAVENOUS | Status: DC

## 2013-01-13 MED ORDER — SIMVASTATIN 20 MG PO TABS
20.0000 mg | ORAL_TABLET | Freq: Every day | ORAL | Status: DC
  Administered 2013-01-14 – 2013-01-15 (×2): 20 mg via ORAL
  Filled 2013-01-13 (×3): qty 1

## 2013-01-13 MED ORDER — ENOXAPARIN SODIUM 40 MG/0.4ML ~~LOC~~ SOLN
40.0000 mg | SUBCUTANEOUS | Status: DC
  Administered 2013-01-13 – 2013-01-15 (×3): 40 mg via SUBCUTANEOUS
  Filled 2013-01-13 (×4): qty 0.4

## 2013-01-13 MED ORDER — ACETAMINOPHEN 325 MG PO TABS
ORAL_TABLET | ORAL | Status: AC
  Filled 2013-01-13: qty 2

## 2013-01-13 MED ORDER — ONDANSETRON HCL 4 MG/2ML IJ SOLN: 4.0000 mg | Freq: Three times a day (TID) | INTRAMUSCULAR | Status: AC | PRN

## 2013-01-13 MED ORDER — FOLIC ACID 1 MG PO TABS
1.0000 mg | ORAL_TABLET | Freq: Every day | ORAL | Status: DC
  Administered 2013-01-14 – 2013-01-16 (×3): 1 mg via ORAL
  Filled 2013-01-13 (×4): qty 1

## 2013-01-13 MED ORDER — DEXTROSE 5 % IV SOLN
1.0000 g | INTRAVENOUS | Status: DC
  Administered 2013-01-14 – 2013-01-15 (×2): 1 g via INTRAVENOUS
  Filled 2013-01-13 (×3): qty 10

## 2013-01-13 MED ORDER — OMEGA-3-ACID ETHYL ESTERS 1 G PO CAPS
1.0000 g | ORAL_CAPSULE | Freq: Every day | ORAL | Status: DC
  Administered 2013-01-14 – 2013-01-16 (×3): 1 g via ORAL
  Filled 2013-01-13 (×3): qty 1

## 2013-01-13 MED ORDER — METHOTREXATE 2.5 MG PO TABS
15.0000 mg | ORAL_TABLET | ORAL | Status: DC
  Administered 2013-01-14: 15 mg via ORAL
  Filled 2013-01-13: qty 6

## 2013-01-13 MED ORDER — ACETAMINOPHEN 325 MG PO TABS
650.0000 mg | ORAL_TABLET | Freq: Once | ORAL | Status: AC
  Administered 2013-01-13: 650 mg via ORAL

## 2013-01-13 MED ORDER — DEXTROSE 5 % IV SOLN
500.0000 mg | INTRAVENOUS | Status: DC
  Administered 2013-01-13 – 2013-01-14 (×2): 500 mg via INTRAVENOUS
  Filled 2013-01-13 (×2): qty 500

## 2013-01-13 NOTE — ED Notes (Signed)
Family at bedside. 

## 2013-01-13 NOTE — ED Notes (Signed)
MD at bedside. 

## 2013-01-13 NOTE — ED Notes (Signed)
Pt presents via [facility] where she was transported from the Urgent Care on the campus of [facility].  Patient states she was her doctors office at 8:30 where she received a flu shot.  Pt c/o of nauseated and vomited at [facility] where she was sent to Urgent Care to be evaluated.  At Urgent Care pt had a temperature of 103 Oral and vomiting x2.  Pt given 2mg  Zogran and 2-325 PO Tylenol

## 2013-01-13 NOTE — ED Notes (Signed)
Notified [facility] 

## 2013-01-13 NOTE — H&P (Signed)
[facility] History and Physical  [name] [name] [id number] DOB: 03-Dec-1928 DOA: 01/13/2013  Referring physician:  PCP: Rama (Stacie Acres, MD  Specialists:   Chief Complaint: Nausea vomiting and fever  HPI: [name] [name] is a 79 y.o. female  With a history of hypertension, rheumatoid arthritis, hyperlipidemia that presents emergency department with complaints of fever, myalgias, nausea and vomiting. Patient states that this recently started. She also received her influenza vaccination on 01/12/2013. Patient was not feeling well this morning so she went to an urgent care and was sent to emergency department. She is found to be strep positive in the emergency department with an x-ray showing a left basilar infiltrate. Patient had been complaining of rhinorrhea as well as nausea vomiting with chills for approximately a day. Upon arrival to emergency department she was found be hypoxic. Patient also complains of some shortness of breath however no chest pain. She denies any dizziness. Patient denies any recent travel, sick contacts, she also denies any diarrhea. Patient also complains of some arthralgias and myalgias. Nothing has seemed to make her her symptoms better. Patient does deny cough at this time.  Review of Systems:  Constitutional: Denies diaphoresis.  Positive for fever, chills, appetite change and fatigue HEENT: Denies photophobia, eye pain, redness, hearing loss, ear pain, sore throat, sneezing, mouth sores, trouble swallowing, neck pain, neck stiffness and tinnitus. Complains of congestion and rhinorrhea.  Respiratory: Denies  DOE, cough, chest tightness,  and wheezing.   complains of shortness of breath. Cardiovascular: Denies chest pain, palpitations and leg swelling.  Gastrointestinal: Denies abdominal pain, diarrhea, constipation, blood in stool and abdominal distention.  complains of nausea and vomiting. Genitourinary: Denies dysuria, urgency, frequency,  hematuria, flank pain and difficulty urinating.  Musculoskeletal: Deniesback pain, joint swelling,and gait problem.  complains of myalgias and arthralgias. Skin: Denies pallor, rash and wound.  Neurological: Denies dizziness, seizures, syncope, weakness, light-headedness, numbness and headaches.  Hematological: Denies adenopathy. Easy bruising, personal or family bleeding history  Psychiatric/Behavioral: Denies suicidal ideation, mood changes, confusion, nervousness, sleep disturbance and agitation  Past Medical History  Diagnosis Date  . HTN (hypertension)   . Chest pain     myoview 04/24/07-mild-mod perfusion defect with mild-mod superimposed ischemiain the mid inferior, apicla inferior, basal inferolateral and mid inferolateral regions  . Rheumatoid arthritis of hand    Past Surgical History  Procedure Laterality Date  . Cholecystectomy  1970s   Social History:  reports that she has never smoked. She has never used smokeless tobacco. She reports that she does not drink alcohol or use illicit drugs. Patient lives at home is able to carry on her daily activities.  Allergies  Allergen Reactions  . Codeine Nausea And Vomiting and Other (See Comments)    Dizziness    Family History  Problem Relation Age of Onset  . Cancer Sister   . Cancer Sister   . Diabetes Father     Prior to Admission medications   Medication Sig Start Date End Date Taking? Authorizing Provider  calcium-vitamin D (OSCAL WITH D) 500-200 MG-UNIT per tablet Take 1 tablet by mouth daily.   Yes Historical Provider, MD  fish oil-omega-3 fatty acids 1000 MG capsule Take 1 g by mouth daily.   Yes Historical Provider, MD  folic acid (FOLVITE) 1 MG tablet Take 1 mg by mouth daily.   Yes Historical Provider, MD  hydrochlorothiazide (HYDRODIURIL) 25 MG tablet Take 25 mg by mouth daily.   Yes Historical Provider, MD  lovastatin (MEVACOR) 40 MG  tablet Take 40 mg by mouth daily. 05/01/12  Yes Historical Provider, MD   methotrexate (RHEUMATREX) 2.5 MG tablet Take 15 mg by mouth once a week. 6 tablet all at once Caution:Chemotherapy. Protect from light. Pt takes on Wednesday.   Yes Historical Provider, MD   Physical Exam: Filed Vitals:   01/13/13 1429  BP: 139/57  Pulse: 82  Temp: 99.8 F (37.7 C)     General: Well developed, well nourished, NAD, appears stated age  HEENT: NCAT, PERRLA, EOMI, Anicteic Sclera, mucous membranes moist. No pharyngeal erythema or exudates  Neck: Supple, no JVD, no masses  Cardiovascular: S1 S2 auscultated, 1/6 SEM. Regular rate and rhythm.  Respiratory: Clear to auscultation bilaterally with equal chest rise  Abdomen: Soft, nontender, nondistended, + bowel sounds  Extremities: warm dry without cyanosis clubbing or edema  Neuro: AAOx3, cranial nerves grossly intact. Strength 5/5 in patient's upper and lower extremities bilaterally  Skin: Without rashes exudates or nodules  Psych: Normal affect and demeanor with intact judgement and insight  Labs on Admission:  Basic Metabolic Panel:  Recent Labs Lab 01/13/13 1504  NA 129*  K 4.1  CL 92*  CO2 27  GLUCOSE 132*  BUN 17  CREATININE 0.92  CALCIUM 8.6  MG 1.5   Liver Function Tests:  Recent Labs Lab 01/13/13 1504  AST 27  ALT 16  ALKPHOS 35*  BILITOT 0.4  PROT 6.9  ALBUMIN 3.4*   No results found for this basename: LIPASE, AMYLASE,  in the last 168 hours No results found for this basename: AMMONIA,  in the last 168 hours CBC:  Recent Labs Lab 01/13/13 1504  WBC 9.2  NEUTROABS 7.8*  HGB 11.7*  HCT 34.0*  MCV 96.9  PLT 195   Cardiac Enzymes:  Recent Labs Lab 01/13/13 1504  TROPONINI <0.30    BNP (last 3 results) No results found for this basename: PROBNP,  in the last 8760 hours CBG: No results found for this basename: GLUCAP,  in the last 168 hours  Radiological Exams on Admission: Dg Chest 2 View  01/13/2013   CLINICAL DATA:  Nausea, vomiting, fever  EXAM: CHEST  2 VIEW   COMPARISON:  04/03/2007  FINDINGS: Cardiomegaly again noted. Central mild vascular congestion without convincing pulmonary edema. Mild elevation of the right hemidiaphragm. Hazy left basilar atelectasis or infiltrate.  IMPRESSION: No pulmonary edema.  Hazy left basilar atelectasis or infiltrate.   Electronically Signed   By: Lahoma Crocker M.D.   On: 01/13/2013 15:56   Dg Abd 1 View  01/13/2013   CLINICAL DATA:  Nausea, vomiting, fever, shortness of breath  EXAM: ABDOMEN - 1 VIEW  COMPARISON:  None.  FINDINGS: The bowel gas pattern is normal. No radio-opaque calculi or other significant radiographic abnormality are seen. Moderate to large amount of fecal retention is identified within the colon.  IMPRESSION: Nonobstructive bowel gas pattern. Moderate-to-large amount of fecal retention.   Electronically Signed   By: Margaree Mackintosh M.D.   On: 01/13/2013 16:03    EKG: Independently reviewed. Sinus rhythm, rate 88, normal axis, PR segment to a 206, first degree AV block  Assessment/Plan Principal Problem:   Sepsis Active Problems:   HTN (hypertension)   Hyperlipidemia   Community acquired pneumonia   CAP (community acquired pneumonia)   Sepsis secondary to community acquired pneumonia Patient will be admitted to inpatient. She'll be admitted to the medical floor and placed on IV azithromycin as well as Rocephin. Patient's strep positive. She also be  placed on continuous oxygen monitoring and on 2 L nasal cannula to maintain her saturations above 90%. Will also obtain a sputum culture and Gram stain. Patient currently has no leukocytosis however has been febrile with tachypnea and somewhat hypoxic.  Acute hypoxic respiratory insufficiency secondary to community-acquired pneumonia Patient was noted to be hypoxic at 89% on room air. Treatment as stated above.  Hypertension Currently stable.  Will hold her home medication of hydrochlorothiazide do to her low sodium. Will place on when necessary  medications.  Hyperlipidemia Will continue her statin as well as fish oil  Rheumatoid arthritis Will continue methotrexate.  Nausea/Vomiting Possibly secondary to pneumonia. Place patient on Zofran when necessary.  Hyponatremia and hypochloremia Secondary to nausea and vomiting. Will place patient on normal saline at 100 mL's per hour for 10 hours and follow BMPs.  Will also hold diuretics.  DVT prophylaxis: Lovenox  Code Status: Full  Condition: Guarded  Family Communication:  Son at bedside. Admission, patients condition and plan of care including tests being ordered have been discussed with the patient and son who indicate understanding and agree with the plan and Code Status.  Disposition Plan: Admitted.   Time spent: 45 minutes  Hiawatha Dressel D.O. [facility] Pager [phone]  If 7PM-7AM, please contact night-coverage [email or web address] Password [facility] 01/13/2013, 4:44 PM

## 2013-01-13 NOTE — ED Notes (Signed)
Patient had sudden onset of vomiting, fever, feeling weak this am.  Patient received a flu shot this am.

## 2013-01-13 NOTE — ED Provider Notes (Signed)
Chief Complaint:  No chief complaint on file.   History of Present Illness:   [name] [name] is an 79 year old female with hypertension and rheumatoid arthritis who has had a two-day history of upper respiratory symptoms with nasal congestion, rhinorrhea, and a croupy cough. She denies any sore throat or fever previously. Today around 8 AM she received the influenza vaccine. Within hours she felt a lot worse with generalized weakness, nausea, bilious vomiting, she felt hot, had a chill, felt dizzy, weak, and was noted to have tachypnea. She denies any chest pain or shortness of breath. She denies abdominal pain or diarrhea. Her vomitus has been green. She has a history of a cholecystectomy years ago. She's never had any kind of reaction to a flu vaccine in the past. Her temperature was 103.1 her oxygen saturation on room air was 88 %.  Review of Systems:  Other than noted above, the patient denies any of the following symptoms. Systemic:  No fever, chills, sweats, fatigue, myalgias, headache, or anorexia. Eye:  No redness, pain or drainage. ENT:  No earache, nasal congestion, rhinorrhea, sinus pressure, or sore throat. Lungs:  No cough, sputum production, wheezing, shortness of breath.  Cardiovascular:  No chest pain, palpitations, or syncope. GI:  No nausea, vomiting, abdominal pain or diarrhea. GU:  No dysuria, frequency, or hematuria. Skin:  No rash or pruritis.  Twain:  Past medical history, family history, social history, meds, and allergies were reviewed.  She takes Os-Cal, folic acid, hydrochlorothiazide, Mevacor, and methotrexate. She has a history of high blood pressure and rheumatoid arthritis. She is allergic to codeine.  Physical Exam:   Vital signs:  There were no vitals taken for this visit. General:  Alert, she appears very uncomfortable, she is tachypnea, and chilling continuously. She is vomiting intermittently with bright green vomitus without blood. She also has intermittent,  croupy, rattly sounding cough. Eye:  PERRL, full EOMs.  Lids and conjunctivas were normal. ENT:  TMs and canals were normal, without erythema or inflammation.  Nasal mucosa was clear and uncongested, without drainage.  Mucous membranes were moist.  Pharynx was clear, without exudate or drainage.  There were no oral ulcerations or lesions. Neck:  Supple, no adenopathy, tenderness or mass. Thyroid was normal. Lungs:  No respiratory distress.  Lungs were clear to auscultation, without wheezes, rales or rhonchi.  Breath sounds were clear and equal bilaterally. Heart:  Regular rhythm, without gallops, murmers or rubs. Abdomen:  Soft, flat, and non-tender to palpation.  No hepatosplenomagaly or mass. Skin:  Clear, warm, and dry, without rash or lesions.  Course in Urgent Care Center:   She was given Zofran 4 mg IM and started on IV normal saline at 50 mL per hour and given oxygen at 2 L per minute via nasal cannula.  Assessment:  The primary encounter diagnosis was Fever. A diagnosis of Nausea and vomiting was also pertinent to this visit.  This could be a reaction to the flu vaccine or an intercurrent illness such as a virus or bacterial infection with pneumonia and urinary tract infection being the 2 most likely culprits. She will need further workup.  Plan:  The patient was transferred to the ED via [facility] in stable condition.  Medical Decision Making:  79 year old female had mild URI Sx past 2 days.  Received flu vaccine at 8:00 a.m. Today.  Within hours felt weak all over, had shaking chills, nausea, vomited bilious fluid and noted to be tachypneic.  On exam she  appears  Uncomfortable, chilling and with tachypnea.  Had episode of green, bilious vomiting here.  We gave Zofran 4 mg IM and will transfer by [facility] with IV NS.  DDx is reaction to influenza vaccine, gastroenteritis, or other source of infection such as pneumonia or UTI.  Temp was 103.1.            Harden Mo, MD 01/13/13  [phone]

## 2013-01-13 NOTE — ED Notes (Signed)
Patient transported to X-ray 

## 2013-01-13 NOTE — ED Notes (Signed)
Ambulating pulse ox stayed around 93-94 % on RA. Dr. Wyvonnia Dusky made aware. Sats dropped to 89-91% initially after getting oob.

## 2013-01-13 NOTE — ED Provider Notes (Signed)
CSN: [id number]     Arrival date & time 01/13/13  1419 History   First MD Initiated Contact with Patient 01/13/13 1427     Chief Complaint  Patient presents with  . Nausea  . Fever   (Consider location/radiation/quality/duration/timing/severity/associated sxs/prior Treatment) HPI Comments: Patient presents from urgent care with upper respiratory symptoms including cough congestion and rhinorrhea. She received a flu shot this morning at 8 AM. She began to feel worse afterwards with generalized weakness, nausea, 2 episodes of vomiting and chills. No chest pain or shortness of breath. No documented fevers at home. No abdominal pain or diarrhea. Family states it was brown. She's had a previous cholecystectomy. No previous reaction to flu vaccine. She is febrile hypoxic at urgent care. She denies any chest pain or shortness of breath.  The history is provided by the patient and a caregiver.    Past Medical History  Diagnosis Date  . HTN (hypertension)   . Chest pain     myoview 04/24/07-mild-mod perfusion defect with mild-mod superimposed ischemiain the mid inferior, apicla inferior, basal inferolateral and mid inferolateral regions  . Rheumatoid arthritis of hand    Past Surgical History  Procedure Laterality Date  . Cholecystectomy  1970s   Family History  Problem Relation Age of Onset  . Cancer Sister   . Cancer Sister   . Diabetes Father    History  Substance Use Topics  . Smoking status: Never Smoker   . Smokeless tobacco: Never Used  . Alcohol Use: No   OB History   Grav Para Term Preterm Abortions TAB SAB Ect Mult Living                 Review of Systems  Constitutional: Positive for fever, chills, activity change and appetite change.  HENT: Positive for congestion, rhinorrhea and sore throat.   Eyes: Negative for visual disturbance.  Respiratory: Negative for cough and shortness of breath.   Cardiovascular: Negative for chest pain.  Gastrointestinal: Positive for  nausea and vomiting. Negative for abdominal pain and diarrhea.  Genitourinary: Negative for vaginal bleeding and vaginal discharge.  Musculoskeletal: Positive for arthralgias and myalgias.  Skin: Negative for rash.  Neurological: Positive for weakness. Negative for dizziness, light-headedness and headaches.  A complete 10 system review of systems was obtained and all systems are negative except as noted in the HPI and PMH.    Allergies  Codeine  Home Medications   No current outpatient prescriptions on file. BP 110/85  Pulse 74  Temp(Src) 98.8 F (37.1 C) (Oral)  Resp 20  Ht 5\' 7"  (1.702 m)  Wt 229 lb 11.5 oz (104.2 kg)  BMI 35.97 kg/m2  SpO2 94% Physical Exam  Constitutional: She is oriented to person, place, and time. She appears well-developed and well-nourished. No distress.  Dry cough  HENT:  Head: Normocephalic and atraumatic.  Mouth/Throat: Oropharynx is clear and moist. No oropharyngeal exudate.  Erythematous oropharynx without exudate or asymmetry  Eyes: Conjunctivae and EOM are normal. Pupils are equal, round, and reactive to light.  Neck: Normal range of motion. Neck supple.  No meningismus  Cardiovascular: Normal rate and regular rhythm.   No murmur heard. Pulmonary/Chest: Effort normal and breath sounds normal. No respiratory distress. She has no wheezes.  Abdominal: Soft. There is no tenderness. There is no rebound and no guarding.  Musculoskeletal: Normal range of motion. She exhibits no edema and no tenderness.  Neurological: She is alert and oriented to person, place, and time. No cranial nerve  deficit. She exhibits normal muscle tone. Coordination normal.  Skin: Skin is warm. No rash noted.    ED Course  Procedures (including critical care time) Labs Review Labs Reviewed  RAPID STREP SCREEN - Abnormal; Notable for the following:    Streptococcus, Group A Screen (Direct) POSITIVE (*)    All other components within normal limits  CBC WITH DIFFERENTIAL  - Abnormal; Notable for the following:    RBC 3.51 (*)    Hemoglobin 11.7 (*)    HCT 34.0 (*)    Neutrophils Relative % 84 (*)    Neutro Abs 7.8 (*)    Lymphocytes Relative 8 (*)    All other components within normal limits  COMPREHENSIVE METABOLIC PANEL - Abnormal; Notable for the following:    Sodium 129 (*)    Chloride 92 (*)    Glucose, Bld 132 (*)    Albumin 3.4 (*)    Alkaline Phosphatase 35 (*)    GFR calc non Af Amer 56 (*)    GFR calc Af Amer 65 (*)    All other components within normal limits  URINALYSIS, ROUTINE W REFLEX MICROSCOPIC - Abnormal; Notable for the following:    APPearance HAZY (*)    Leukocytes, UA MODERATE (*)    All other components within normal limits  URINE MICROSCOPIC-ADD ON - Abnormal; Notable for the following:    Squamous Epithelial / LPF FEW (*)    Bacteria, UA FEW (*)    All other components within normal limits  CULTURE, BLOOD (ROUTINE X 2)  CULTURE, BLOOD (ROUTINE X 2)  URINE CULTURE  CULTURE, BLOOD (ROUTINE X 2)  CULTURE, BLOOD (ROUTINE X 2)  CULTURE, EXPECTORATED SPUTUM-ASSESSMENT  GRAM STAIN  TROPONIN I  MAGNESIUM  LEGIONELLA ANTIGEN, URINE  STREP PNEUMONIAE URINARY ANTIGEN  CBC  CREATININE, SERUM  CBC  BASIC METABOLIC PANEL   Imaging Review Dg Chest 2 View  01/13/2013   CLINICAL DATA:  Nausea, vomiting, fever  EXAM: CHEST  2 VIEW  COMPARISON:  04/03/2007  FINDINGS: Cardiomegaly again noted. Central mild vascular congestion without convincing pulmonary edema. Mild elevation of the right hemidiaphragm. Hazy left basilar atelectasis or infiltrate.  IMPRESSION: No pulmonary edema.  Hazy left basilar atelectasis or infiltrate.   Electronically Signed   By: Lahoma Crocker M.D.   On: 01/13/2013 15:56   Dg Abd 1 View  01/13/2013   CLINICAL DATA:  Nausea, vomiting, fever, shortness of breath  EXAM: ABDOMEN - 1 VIEW  COMPARISON:  None.  FINDINGS: The bowel gas pattern is normal. No radio-opaque calculi or other significant radiographic  abnormality are seen. Moderate to large amount of fecal retention is identified within the colon.  IMPRESSION: Nonobstructive bowel gas pattern. Moderate-to-large amount of fecal retention.   Electronically Signed   By: Margaree Mackintosh M.D.   On: 01/13/2013 16:03    EKG Interpretation    Date/Time:  Tuesday January 13 2013 14:47:25 EST Ventricular Rate:  88 PR Interval:  206 QRS Duration: 75 QT Interval:  417 QTC Calculation: 505 R Axis:   45 Text Interpretation:  Sinus rhythm Borderline T abnormalities, anterior leads Prolonged QT interval Nonspecific T wave abnormality Confirmed by Wyvonnia Dusky  MD, Creston Klas ([id number]) on 01/13/2013 3:07:11 PM            MDM   1. CAP (community acquired pneumonia)   2. Hyponatremia   3. Community acquired pneumonia   4. HTN (hypertension)   5. Hyperlipidemia   6. Sepsis    2 day  history of upper respiratory symptoms with recent flu vaccine. Now with shaking chills, nausea and vomiting. Vomiting has since resolved. No abdominal pain or chest pain.  Rapid strep is positive.  Bicillin given. CXR with L basilar infiltrate, azithromycin added.  O2 saturations 100% on 2L. Temp down to 99. On ambulation, saturation decreased to 91%.   Patient with no increased work of breathing at rest.  Hyponatremia likely 2/2 dehydration. Will admit for IV antibiotics and IV fluids.  Ezequiel Essex, MD 01/13/13 [phone]

## 2013-01-14 DIAGNOSIS — A419 Sepsis, unspecified organism: Secondary | ICD-10-CM | POA: Diagnosis not present

## 2013-01-14 DIAGNOSIS — I10 Essential (primary) hypertension: Secondary | ICD-10-CM | POA: Diagnosis not present

## 2013-01-14 DIAGNOSIS — J189 Pneumonia, unspecified organism: Secondary | ICD-10-CM | POA: Diagnosis not present

## 2013-01-14 LAB — BASIC METABOLIC PANEL
BUN: 16 mg/dL (ref 6–23)
CO2: 29 mEq/L (ref 19–32)
Calcium: 8.3 mg/dL — ABNORMAL LOW (ref 8.4–10.5)
Chloride: 99 mEq/L (ref 96–112)
Creatinine, Ser: 1.08 mg/dL (ref 0.50–1.10)
GFR calc Af Amer: 54 mL/min — ABNORMAL LOW (ref >90)
GFR calc non Af Amer: 46 mL/min — ABNORMAL LOW (ref >90)
Glucose, Bld: 102 mg/dL — ABNORMAL HIGH (ref 70–99)
Potassium: 4.4 mEq/L (ref 3.5–5.1)
Sodium: 132 mEq/L — ABNORMAL LOW (ref 135–145)

## 2013-01-14 LAB — CBC
HCT: 32.1 % — ABNORMAL LOW (ref 36.0–46.0)
Hemoglobin: 10.8 g/dL — ABNORMAL LOW (ref 12.0–15.0)
MCH: 33.4 pg (ref 26.0–34.0)
MCHC: 33.6 g/dL (ref 30.0–36.0)
MCV: 99.4 fL (ref 78.0–100.0)
Platelets: 174 10*3/uL (ref 150–400)
RBC: 3.23 MIL/uL — ABNORMAL LOW (ref 3.87–5.11)
RDW: 14.6 % (ref 11.5–15.5)
WBC: 7.2 10*3/uL (ref 4.0–10.5)

## 2013-01-14 LAB — LEGIONELLA ANTIGEN, URINE: Legionella Antigen, Urine: NEGATIVE

## 2013-01-14 LAB — URINE CULTURE
Colony Count: NO GROWTH
Culture: NO GROWTH

## 2013-01-14 LAB — STREP PNEUMONIAE URINARY ANTIGEN: Strep Pneumo Urinary Antigen: NEGATIVE

## 2013-01-14 MED ORDER — PHENOL 1.4 % MT LIQD
1.0000 | OROMUCOSAL | Status: DC | PRN
  Filled 2013-01-14: qty 177

## 2013-01-14 MED ORDER — MENTHOL 3 MG MT LOZG
1.0000 | LOZENGE | OROMUCOSAL | Status: DC | PRN
  Administered 2013-01-14 (×2): 3 mg via ORAL
  Filled 2013-01-14: qty 9

## 2013-01-14 MED ORDER — DOCUSATE SODIUM 100 MG PO CAPS
100.0000 mg | ORAL_CAPSULE | Freq: Two times a day (BID) | ORAL | Status: DC
  Administered 2013-01-14 – 2013-01-16 (×5): 100 mg via ORAL
  Filled 2013-01-14 (×6): qty 1

## 2013-01-14 NOTE — Progress Notes (Signed)
Nutrition Brief Note  Patient identified on the Malnutrition Screening Tool (MST) Report  Wt Readings from Last 15 Encounters:  01/13/13 229 lb 11.5 oz (104.2 kg)  07/21/12 227 lb (102.967 kg)    Body mass index is 35.97 kg/(m^2). Patient meets criteria for Obesity, Class II based on current BMI.   Current diet order is Heart Healthy, patient is consuming approximately 75% of meals at this time. Labs and medications reviewed.   Patient reports a good appetite with no recent weight loss.   No nutrition interventions warranted at this time. If nutrition issues arise, please consult RD.   Larey Seat, RD, LDN Pager #: [phone] After-Hours Pager #: [phone]

## 2013-01-14 NOTE — Evaluation (Signed)
Physical Therapy Evaluation Patient Details Name: [name] [name] MRN: [id number] DOB: 1928-03-05 Today's Date: 01/14/2013 Time: [id number] PT Time Calculation (min): 20 min  PT Assessment / Plan / Recommendation History of Present Illness   Pt admitted w Nausea vomiting and fever   Clinical Impression  Admitted w above complaints.  Per pt and daughter pt ambulated at baseline level today with PT.  No further PT needs  Identified. Recommend home with family when medically ready.  O2 sats as below. Advised pt to ambulate with nursing several times a day and to not get up on her own.  Pt and daughter in agreement with plan.      PT Assessment  Patent does not need any further PT services    Follow Up Recommendations  No PT follow up    Does the patient have the potential to tolerate intense rehabilitation      Barriers to Discharge        Equipment Recommendations  None recommended by PT    Recommendations for Other Services     Frequency      Precautions / Restrictions     Pertinent Vitals/Pain Pt does not report any pain  SATURATION QUALIFICATIONS: (This note is used to comply with regulatory documentation for home oxygen)  Patient Saturations on Room Air at Rest = 95%  Patient Saturations on Room Air while Ambulating = 90%  Patient Saturations on Liters of oxygen while Ambulating = %  Not tested With about 1 minute rest after ambulation sats climbed to 94%.         Mobility  Bed Mobility Bed Mobility: Supine to Sit Supine to Sit: 4: Min assist;Other (comment) (daughter helped her sit up) Transfers Transfers: Sit to Stand;Stand to Sit Sit to Stand: 5: Supervision;From bed Stand to Sit: 5: Supervision;To bed;To chair/3-in-1 Details for Transfer Assistance: no cues needed Ambulation/Gait Ambulation/Gait Assistance: 5: Supervision Ambulation Distance (Feet): 100 Feet (also ambulated 150  On second attempt). Second attempt to test endurance. Pt does not report SOB  with ambulation.  Did spit up sputum after first walk.   Assistive device: None Gait Pattern: Step-through pattern;Wide base of support;Decreased stride length Gait velocity:  WFL for household ambulation    Exercises     PT Diagnosis:    PT Problem List:   PT Treatment Interventions:       PT Goals(Current goals can be found in the care plan section)    Visit Information  Last PT Received On: 01/14/13 Assistance Needed: +1       Prior [place] expects to be discharged to:: Private residence Living Arrangements: Children Available Help at Discharge: Available PRN/intermittently Type of Home: House Home Access: Level entry Home Layout: One [place]: None Prior Function Level of Independence: Independent Communication Communication: No difficulties    Cognition  Cognition Arousal/Alertness: Awake/alert Behavior During Therapy: WFL for tasks assessed/performed Overall Cognitive Status: Within Functional Limits for tasks assessed    Extremity/Trunk Assessment Upper Extremity Assessment Upper Extremity Assessment: Overall WFL for tasks assessed Lower Extremity Assessment Lower Extremity Assessment: Overall WFL for tasks assessed Cervical / Trunk Assessment Cervical / Trunk Assessment: Normal   Balance High Level Balance High Level Balance Comments: no balance losses with all of mobility  End of Session PT - End of Session Equipment Utilized During Treatment: Gait belt Activity Tolerance: Patient tolerated treatment well Patient left: in chair;with family/visitor present Nurse Communication: Mobility status;Other (comment) (oxygen saturation)  GP  Melvern Banker 01/14/2013, 12:42 PM Lavonia Dana, [place] 01/14/2013

## 2013-01-14 NOTE — Progress Notes (Signed)
Addendum  Patient seen and examined, chart and data base reviewed.  I agree with the above assessment and plan.  For full details please see Mrs. Imogene Burn PA note.  CAP and group A Strep pharyngitis on Azithromycin and Rocephin.   Birdie Hopes, MD [facility] Pager: [phone] 01/14/2013, 2:17 PM

## 2013-01-14 NOTE — Progress Notes (Signed)
[facility] PROGRESS NOTE  [name] [name] [id number] DOB: 1928/06/23 DOA: 01/13/2013 PCP: Rama (Merrilee Seashore) Chryl Heck, MD  Assessment/Plan:  Community acquired pneumonia Fever of 103.1 overnight. Urine for legionella and Strep antigen are negative Bld cx still pending Started on azith and rocephin at the time of admission Oxygen PRN.  Group A strep infection Tested positive.   Received Penicillin g injection on admission.   Supportive care  UTI U/A is border line.  Cultures pending Already on appropriate antibiotics.  Rheumatoid Arthritis Continue methotrexate  Hyperlipidemia Continue Mevacor  HTN HCTZ currently held. BP stable.  DVT Prophylaxis:  lovenox  Code Status: full Family Communication: daughter at bedside Disposition Plan: inpatient.  PT evaluated.  Patient has no PT needs.   HPI/Subjective: Feels tired.  Objective: Filed Vitals:   01/13/13 2225 01/14/13 0524 01/14/13 1032 01/14/13 1033  BP:  135/71    Pulse:  65    Temp:  98.8 F (37.1 C)    TempSrc:  Oral    Resp:  20    Height:      Weight:      SpO2: 99% 97% 91% 95%    Intake/Output Summary (Last 24 hours) at 01/14/13 1318 Last data filed at 01/14/13 0549  Gross per 24 hour  Intake   1162 ml  Output      0 ml  Net   1162 ml   Filed Weights   01/13/13 1429 01/13/13 1747  Weight: 102.967 kg (227 lb) 104.2 kg (229 lb 11.5 oz)    Exam:   General:  A&O, NAD, Appears fatigued, Lying comfortably in bed  Cardiovascular: RRR, no murmurs, rubs or gallops, no lower extremity edema  Respiratory:  Mild expiratory wheeze.  No increased work of breathing.  Abdomen: Soft, non-tender, non-distended, + bowel sounds, no masses  Musculoskeletal: Able to move all 4 extremities, 5/5 strength in each  Data Reviewed: Basic Metabolic Panel:  Recent Labs Lab 01/13/13 1504 01/14/13 0540  NA 129* 132*  K 4.1 4.4  CL 92* 99  CO2 27 29  GLUCOSE 132* 102*  BUN 17 16   CREATININE 0.92 1.08  CALCIUM 8.6 8.3*  MG 1.5  --    Liver Function Tests:  Recent Labs Lab 01/13/13 1504  AST 27  ALT 16  ALKPHOS 35*  BILITOT 0.4  PROT 6.9  ALBUMIN 3.4*     CBC:  Recent Labs Lab 01/13/13 1504 01/14/13 0540  WBC 9.2 7.2  NEUTROABS 7.8*  --   HGB 11.7* 10.8*  HCT 34.0* 32.1*  MCV 96.9 99.4  PLT 195 174   Cardiac Enzymes:  Recent Labs Lab 01/13/13 1504  TROPONINI <0.30       Recent Results (from the past 240 hour(s))  RAPID STREP SCREEN     Status: Abnormal   Collection Time    01/13/13  2:52 PM      Result Value Range Status   Streptococcus, Group A Screen (Direct) POSITIVE (*) NEGATIVE Final     Studies: Dg Chest 2 View  01/13/2013   CLINICAL DATA:  Nausea, vomiting, fever  EXAM: CHEST  2 VIEW  COMPARISON:  04/03/2007  FINDINGS: Cardiomegaly again noted. Central mild vascular congestion without convincing pulmonary edema. Mild elevation of the right hemidiaphragm. Hazy left basilar atelectasis or infiltrate.  IMPRESSION: No pulmonary edema.  Hazy left basilar atelectasis or infiltrate.   Electronically Signed   By: Lahoma Crocker M.D.   On: 01/13/2013 15:56   Dg Abd 1 View  01/13/2013   CLINICAL DATA:  Nausea, vomiting, fever, shortness of breath  EXAM: ABDOMEN - 1 VIEW  COMPARISON:  None.  FINDINGS: The bowel gas pattern is normal. No radio-opaque calculi or other significant radiographic abnormality are seen. Moderate to large amount of fecal retention is identified within the colon.  IMPRESSION: Nonobstructive bowel gas pattern. Moderate-to-large amount of fecal retention.   Electronically Signed   By: Margaree Mackintosh M.D.   On: 01/13/2013 16:03    Scheduled Meds: . azithromycin  500 mg Intravenous Q24H  . calcium-vitamin D  1 tablet Oral Daily  . cefTRIAXone (ROCEPHIN)  IV  1 g Intravenous Q24H  . docusate sodium  100 mg Oral BID  . enoxaparin (LOVENOX) injection  40 mg Subcutaneous Q24H  . folic acid  1 mg Oral Daily  .  methotrexate  15 mg Oral Q Wed  . omega-3 acid ethyl esters  1 g Oral Daily  . simvastatin  20 mg Oral q1800   Continuous Infusions:   Principal Problem:   Sepsis Active Problems:   HTN (hypertension)   Hyperlipidemia   Community acquired pneumonia   CAP (community acquired pneumonia)   Hyponatremia   Hypochloremia   Rheumatoid arthritis    Karen Kitchens  [facility] Pager [phone]. If 7PM-7AM, please contact night-coverage at [email or web address], password [facility] 01/14/2013, 1:18 PM  LOS: 1 day

## 2013-01-14 NOTE — Progress Notes (Signed)
Chaplain visited with pt and pt's daughter.  Daughter spoke of the grief from death of her nephew who died of a gunshot wound.  Chaplain offered compassionate presence, empathic listening and prayer.  Pt and pt's daughter expressed appreciation for chaplain's visit.     01/14/13 1000  Clinical Encounter Type  Visited With Patient and family together  Visit Type Initial;Spiritual support  Referral From Patient;Nurse  Spiritual Encounters  Spiritual Needs Prayer  Stress Factors  Patient Stress Factors Loss  Family Stress Factors Loss    [name] [name]

## 2013-01-14 NOTE — Progress Notes (Signed)
Utilization review completed. Aalyiah Camberos, RN, BSN. 

## 2013-01-14 NOTE — Care Management Note (Unsigned)
    Page 1 of 1   01/14/2013     4:01:47 PM   CARE MANAGEMENT NOTE 01/14/2013  Patient:  [name] [name]   Account Number:  [id number]  Date Initiated:  01/14/2013  Documentation initiated by:  Tomi Bamberger  Subjective/Objective Assessment:   dx sepsis  admit-- lives with daughter.     Action/Plan:   Anticipated DC Date:  01/17/2013   Anticipated DC Plan:  [name]         Choice offered to / List presented to:             Status of service:  In process, will continue to follow Medicare Important Message given?   (If response is "NO", the following Medicare IM given date fields will be blank) Date Medicare IM given:   Date Additional Medicare IM given:    Discharge Disposition:    Per UR Regulation:  Reviewed for med. necessity/level of care/duration of stay  If discussed at [place] of Stay Meetings, dates discussed:    Comments:  01/14/13 16:00 Tomi Bamberger RN, BSN [phone] patient lives with daughter, NCM will continue to follow for dc needs.

## 2013-01-15 DIAGNOSIS — M069 Rheumatoid arthritis, unspecified: Secondary | ICD-10-CM

## 2013-01-15 DIAGNOSIS — J189 Pneumonia, unspecified organism: Secondary | ICD-10-CM | POA: Diagnosis not present

## 2013-01-15 DIAGNOSIS — E871 Hypo-osmolality and hyponatremia: Secondary | ICD-10-CM

## 2013-01-15 DIAGNOSIS — I10 Essential (primary) hypertension: Secondary | ICD-10-CM | POA: Diagnosis not present

## 2013-01-15 LAB — CBC
HCT: 31 % — ABNORMAL LOW (ref 36.0–46.0)
Hemoglobin: 10.7 g/dL — ABNORMAL LOW (ref 12.0–15.0)
MCH: 33.5 pg (ref 26.0–34.0)
MCHC: 34.5 g/dL (ref 30.0–36.0)
MCV: 97.2 fL (ref 78.0–100.0)
Platelets: 170 10*3/uL (ref 150–400)
RBC: 3.19 MIL/uL — ABNORMAL LOW (ref 3.87–5.11)
RDW: 14.5 % (ref 11.5–15.5)
WBC: 6.5 10*3/uL (ref 4.0–10.5)

## 2013-01-15 MED ORDER — AZITHROMYCIN 500 MG PO TABS
500.0000 mg | ORAL_TABLET | Freq: Every day | ORAL | Status: DC
  Administered 2013-01-15 – 2013-01-16 (×2): 500 mg via ORAL
  Filled 2013-01-15 (×2): qty 1

## 2013-01-15 NOTE — Progress Notes (Signed)
[facility] PROGRESS NOTE  [name] [name] [id number] DOB: September 21, 1928 DOA: 01/13/2013 PCP: Rama Merrilee Seashore) Chryl Heck, MD   HPI/Subjective: Feels better has more energy but still not back to her baseline. 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 Urine for legionella and Strep antigen are negative Bld cx still pending Started on azith and rocephin at the time of admission Oxygen PRN.  Group A strep infection Tested positive.   Received Penicillin g injection on admission.  Now on Rocephin and azithromycin Supportive care  UTI U/A is border line.  Cultures pending Already on appropriate antibiotics.  Rheumatoid Arthritis Continue methotrexate  Hyperlipidemia Continue Mevacor  HTN HCTZ currently held. BP stable.  DVT Prophylaxis:  lovenox  Code Status: full Family Communication:  Disposition Plan: inpatient.  PT evaluated.  Patient has no PT needs.   Objective: Filed Vitals:   01/14/13 1430 01/14/13 1824 01/14/13 1953 01/15/13 0603  BP: 151/78  138/80 136/74  Pulse: 66  66 83  Temp: 99.3 F (37.4 C)  99.4 F (37.4 C) 98 F (36.7 C)  TempSrc: Oral  Oral Oral  Resp: 20  20 20   Height:      Weight:      SpO2: 94% 95% 93% 90%    Intake/Output Summary (Last 24 hours) at 01/15/13 1104 Last data filed at 01/15/13 0858  Gross per 24 hour  Intake   1440 ml  Output      0 ml  Net   1440 ml   Filed Weights   01/13/13 1429 01/13/13 1747  Weight: 102.967 kg (227 lb) 104.2 kg (229 lb 11.5 oz)    Exam:   General:  A&O, NAD, Appears fatigued, Lying comfortably in bed  Cardiovascular: RRR, no murmurs, rubs or gallops, no lower extremity edema  Respiratory:  Mild expiratory wheeze.  No increased work of breathing.  Abdomen: Soft, non-tender, non-distended, + bowel sounds, no masses  Musculoskeletal: Able to move all 4  extremities, 5/5 strength in each  Data Reviewed: Basic Metabolic Panel:  Recent Labs Lab 01/13/13 1504 01/14/13 0540  NA 129* 132*  K 4.1 4.4  CL 92* 99  CO2 27 29  GLUCOSE 132* 102*  BUN 17 16  CREATININE 0.92 1.08  CALCIUM 8.6 8.3*  MG 1.5  --    Liver Function Tests:  Recent Labs Lab 01/13/13 1504  AST 27  ALT 16  ALKPHOS 35*  BILITOT 0.4  PROT 6.9  ALBUMIN 3.4*     CBC:  Recent Labs Lab 01/13/13 1504 01/14/13 0540 01/15/13 0440  WBC 9.2 7.2 6.5  NEUTROABS 7.8*  --   --   HGB 11.7* 10.8* 10.7*  HCT 34.0* 32.1* 31.0*  MCV 96.9 99.4 97.2  PLT 195 174 170   Cardiac Enzymes:  Recent Labs Lab 01/13/13 1504  TROPONINI <0.30       Recent Results (from the past 240 hour(s))  RAPID STREP SCREEN     Status: Abnormal   Collection Time    01/13/13  2:52 PM      Result Value Range Status   Streptococcus, Group A Screen (Direct) POSITIVE (*) NEGATIVE Final  URINE CULTURE     Status: None   Collection Time    01/13/13  5:12 PM      Result Value Range Status   Specimen Description URINE, CLEAN CATCH   Final   Special Requests NONE  Final   Culture  Setup Time     Final   Value: 01/13/2013 21:56     Performed at [facility]     Final   Value: NO GROWTH     Performed at [facility]   Culture     Final   Value: NO GROWTH     Performed at [facility]   Report Status 01/14/2013 FINAL   Final  CULTURE, BLOOD (ROUTINE X 2)     Status: None   Collection Time    01/13/13  8:30 PM      Result Value Range Status   Specimen Description BLOOD RIGHT ARM   Final   Special Requests BOTTLES DRAWN AEROBIC AND ANAEROBIC 10CC   Final   Culture  Setup Time     Final   Value: 01/14/2013 01:08     Performed at [facility]   Culture     Final   Value:        BLOOD CULTURE RECEIVED NO GROWTH TO DATE CULTURE WILL BE HELD FOR 5 DAYS BEFORE ISSUING A FINAL NEGATIVE REPORT     Performed at [facility]    Report Status PENDING   Incomplete  CULTURE, BLOOD (ROUTINE X 2)     Status: None   Collection Time    01/13/13  8:40 PM      Result Value Range Status   Specimen Description BLOOD RIGHT HAND   Final   Special Requests BOTTLES DRAWN AEROBIC AND ANAEROBIC 10CC   Final   Culture  Setup Time     Final   Value: 01/14/2013 01:07     Performed at [facility]   Culture     Final   Value:        BLOOD CULTURE RECEIVED NO GROWTH TO DATE CULTURE WILL BE HELD FOR 5 DAYS BEFORE ISSUING A FINAL NEGATIVE REPORT     Performed at [facility]   Report Status PENDING   Incomplete     Studies: Dg Chest 2 View  01/13/2013   CLINICAL DATA:  Nausea, vomiting, fever  EXAM: CHEST  2 VIEW  COMPARISON:  04/03/2007  FINDINGS: Cardiomegaly again noted. Central mild vascular congestion without convincing pulmonary edema. Mild elevation of the right hemidiaphragm. Hazy left basilar atelectasis or infiltrate.  IMPRESSION: No pulmonary edema.  Hazy left basilar atelectasis or infiltrate.   Electronically Signed   By: Lahoma Crocker M.D.   On: 01/13/2013 15:56   Dg Abd 1 View  01/13/2013   CLINICAL DATA:  Nausea, vomiting, fever, shortness of breath  EXAM: ABDOMEN - 1 VIEW  COMPARISON:  None.  FINDINGS: The bowel gas pattern is normal. No radio-opaque calculi or other significant radiographic abnormality are seen. Moderate to large amount of fecal retention is identified within the colon.  IMPRESSION: Nonobstructive bowel gas pattern. Moderate-to-large amount of fecal retention.   Electronically Signed   By: Margaree Mackintosh M.D.   On: 01/13/2013 16:03    Scheduled Meds: . azithromycin  500 mg Intravenous Q24H  . calcium-vitamin D  1 tablet Oral Daily  . cefTRIAXone (ROCEPHIN)  IV  1 g Intravenous Q24H  . docusate sodium  100 mg Oral BID  . enoxaparin (LOVENOX) injection  40 mg Subcutaneous Q24H  . folic acid  1 mg Oral Daily  . methotrexate  15 mg Oral Q Wed  . omega-3 acid ethyl esters  1 g Oral Daily   .  simvastatin  20 mg Oral q1800   Continuous Infusions:   Principal Problem:   Sepsis Active Problems:   HTN (hypertension)   Hyperlipidemia   Community acquired pneumonia   CAP (community acquired pneumonia)   Hyponatremia   Hypochloremia   Rheumatoid arthritis    Arleigh Dicola A [facility] Pager [phone] If 7PM-7AM, please contact night-coverage at [email or web address], password [facility] 01/15/2013, 11:04 AM  LOS: 2 days

## 2013-01-16 DIAGNOSIS — I359 Nonrheumatic aortic valve disorder, unspecified: Secondary | ICD-10-CM

## 2013-01-16 DIAGNOSIS — J189 Pneumonia, unspecified organism: Secondary | ICD-10-CM | POA: Diagnosis not present

## 2013-01-16 DIAGNOSIS — I10 Essential (primary) hypertension: Secondary | ICD-10-CM | POA: Diagnosis not present

## 2013-01-16 DIAGNOSIS — M069 Rheumatoid arthritis, unspecified: Secondary | ICD-10-CM | POA: Diagnosis not present

## 2013-01-16 LAB — BASIC METABOLIC PANEL
BUN: 17 mg/dL (ref 6–23)
CO2: 30 mEq/L (ref 19–32)
Calcium: 8.6 mg/dL (ref 8.4–10.5)
Chloride: 98 mEq/L (ref 96–112)
Creatinine, Ser: 0.93 mg/dL (ref 0.50–1.10)
GFR calc Af Amer: 65 mL/min — ABNORMAL LOW (ref >90)
GFR calc non Af Amer: 56 mL/min — ABNORMAL LOW (ref >90)
Glucose, Bld: 112 mg/dL — ABNORMAL HIGH (ref 70–99)
Potassium: 4.2 mEq/L (ref 3.5–5.1)
Sodium: 136 mEq/L (ref 135–145)

## 2013-01-16 LAB — CBC
HCT: 31.2 % — ABNORMAL LOW (ref 36.0–46.0)
Hemoglobin: 10.6 g/dL — ABNORMAL LOW (ref 12.0–15.0)
MCH: 33.1 pg (ref 26.0–34.0)
MCHC: 34 g/dL (ref 30.0–36.0)
MCV: 97.5 fL (ref 78.0–100.0)
Platelets: 182 10*3/uL (ref 150–400)
RBC: 3.2 MIL/uL — ABNORMAL LOW (ref 3.87–5.11)
RDW: 14.4 % (ref 11.5–15.5)
WBC: 6 10*3/uL (ref 4.0–10.5)

## 2013-01-16 MED ORDER — GUAIFENESIN ER 600 MG PO TB12: 1200.0000 mg | ORAL_TABLET | Freq: Two times a day (BID) | ORAL | Status: DC

## 2013-01-16 MED ORDER — LEVOFLOXACIN 750 MG PO TABS: 750.0000 mg | ORAL_TABLET | Freq: Every day | ORAL | Status: DC

## 2013-01-16 NOTE — Discharge Summary (Signed)
Physician Discharge Summary  [name] [name] [id number] DOB: 05/27/1928 DOA: 01/13/2013  PCP: Rama Merrilee Seashore) Chryl Heck, MD  Admit date: 01/13/2013 Discharge date: 01/16/2013  Time spent: 40 minutes  Recommendations for Outpatient Follow-up:  1. Followup with primary care physician within one week  Discharge Diagnoses:  Principal Problem:   Sepsis Active Problems:   HTN (hypertension)   Hyperlipidemia   Community acquired pneumonia   CAP (community acquired pneumonia)   Hyponatremia   Hypochloremia   Rheumatoid arthritis   Discharge Condition: Stable  Diet recommendation: Heart healthy  Filed Weights   01/13/13 1429 01/13/13 1747  Weight: 102.967 kg (227 lb) 104.2 kg (229 lb 11.5 oz)    History of present illness:  [name] [name] is a 79 y.o. female  With a history of hypertension, rheumatoid arthritis, hyperlipidemia that presents emergency department with complaints of fever, myalgias, nausea and vomiting. Patient states that this recently started. She also received her influenza vaccination on 01/12/2013. Patient was not feeling well this morning so she went to an urgent care and was sent to emergency department. She is found to be strep positive in the emergency department with an x-ray showing a left basilar infiltrate. Patient had been complaining of rhinorrhea as well as nausea vomiting with chills for approximately a day. Upon arrival to emergency department she was found be hypoxic. Patient also complains of some shortness of breath however no chest pain. She denies any dizziness. Patient denies any recent travel, sick contacts, she also denies any diarrhea. Patient also complains of some arthralgias and myalgias. Nothing has seemed to make her her symptoms better. Patient does deny cough at this time.  Hospital Course:   1. Community acquired pneumonia: Patient presented to the hospital with fever of 103.1, chest x-ray showed left basilar infiltrate. Patient  started empirically on Rocephin and azithromycin. Urine is negative for Legionella and strep antigen. Blood culture no growth to date, oxygen was provided as when necessary as well as antitussives and mucolytics. Patient did very well and discharge on Levaquin for 5 more days.  2. Group A strep infection: Tested positive, patient received penicillin G injection in the emergency department, she was on Rocephin and azithromycin for her pneumonia and now Levaquin.  3. UTI: Urinalysis was consistent with UTI, patient was on Rocephin, cultures negative at the time of discharge.   4. Rheumatoid arthritis: Patient is on methotrexate, that was continued throughout the hospital stay. Patient to followup with her primary care physician.  5. Hypertension/hyperlipidemia: Chronic and stable medical conditions. Patient is on HCTZ and Mevacor. Medications continued throughout the hospital stay without any changes.    Procedures:  None  Consultations:  None  Discharge Exam: Filed Vitals:   01/16/13 0450  BP: 124/77  Pulse: 62  Temp: 97.1 F (36.2 C)  Resp: 18   General: Alert and awake, oriented x3, not in any acute distress. HEENT: anicteric sclera, pupils reactive to light and accommodation, EOMI CVS: S1-S2 clear, no murmur rubs or gallops Chest: clear to auscultation bilaterally, no wheezing, rales or rhonchi Abdomen: soft nontender, nondistended, normal bowel sounds, no organomegaly Extremities: no cyanosis, clubbing or edema noted bilaterally Neuro: Cranial nerves II-XII intact, no focal neurological deficits  Discharge Instructions  Discharge Orders   Future Appointments Provider Department Dept Phone   01/19/2013 11:45 AM [facility] [place] [phone]   Patient should wear two piece clothing and wear no powder or deodorant. Patient should arrive 15 minutes early.   Future  Orders Complete By Expires   Diet - low sodium heart healthy  As directed     Increase activity slowly  As directed        Medication List         calcium-vitamin D 500-200 MG-UNIT per tablet  Commonly known as:  OSCAL WITH D  Take 1 tablet by mouth daily.     fish oil-omega-3 fatty acids 1000 MG capsule  Take 1 g by mouth daily.     folic acid 1 MG tablet  Commonly known as:  FOLVITE  Take 1 mg by mouth daily.     guaiFENesin 600 MG 12 hr tablet  Commonly known as:  MUCINEX  Take 2 tablets (1,200 mg total) by mouth 2 (two) times daily.     hydrochlorothiazide 25 MG tablet  Commonly known as:  HYDRODIURIL  Take 25 mg by mouth daily.     levofloxacin 750 MG tablet  Commonly known as:  LEVAQUIN  Take 1 tablet (750 mg total) by mouth daily.     lovastatin 40 MG tablet  Commonly known as:  MEVACOR  Take 40 mg by mouth daily.     methotrexate 2.5 MG tablet  Commonly known as:  RHEUMATREX  Take 15 mg by mouth once a week. 6 tablet all at once Caution:Chemotherapy. Protect from light. Pt takes on Wednesday.       Allergies  Allergen Reactions  . Codeine Nausea And Vomiting and Other (See Comments)    Dizziness       Follow-up Information   Follow up with Rama Stacie Acres, MD.   Specialty:  Internal Medicine   Contact information:   [address] [place]  [address] [phone]        The results of significant diagnostics from this hospitalization (including imaging, microbiology, ancillary and laboratory) are listed below for reference.    Significant Diagnostic Studies: Dg Chest 2 View  01/13/2013   CLINICAL DATA:  Nausea, vomiting, fever  EXAM: CHEST  2 VIEW  COMPARISON:  04/03/2007  FINDINGS: Cardiomegaly again noted. Central mild vascular congestion without convincing pulmonary edema. Mild elevation of the right hemidiaphragm. Hazy left basilar atelectasis or infiltrate.  IMPRESSION: No pulmonary edema.  Hazy left basilar atelectasis or infiltrate.   Electronically Signed   By: Lahoma Crocker M.D.   On:  01/13/2013 15:56   Dg Abd 1 View  01/13/2013   CLINICAL DATA:  Nausea, vomiting, fever, shortness of breath  EXAM: ABDOMEN - 1 VIEW  COMPARISON:  None.  FINDINGS: The bowel gas pattern is normal. No radio-opaque calculi or other significant radiographic abnormality are seen. Moderate to large amount of fecal retention is identified within the colon.  IMPRESSION: Nonobstructive bowel gas pattern. Moderate-to-large amount of fecal retention.   Electronically Signed   By: Margaree Mackintosh M.D.   On: 01/13/2013 16:03    Microbiology: Recent Results (from the past 240 hour(s))  RAPID STREP SCREEN     Status: Abnormal   Collection Time    01/13/13  2:52 PM      Result Value Range Status   Streptococcus, Group A Screen (Direct) POSITIVE (*) NEGATIVE Final  URINE CULTURE     Status: None   Collection Time    01/13/13  5:12 PM      Result Value Range Status   Specimen Description URINE, CLEAN CATCH   Final   Special Requests NONE   Final   Culture  Setup Time  Final   Value: 01/13/2013 21:56     Performed at [facility]     Final   Value: NO GROWTH     Performed at [facility]   Culture     Final   Value: NO GROWTH     Performed at [facility]   Report Status 01/14/2013 FINAL   Final  CULTURE, BLOOD (ROUTINE X 2)     Status: None   Collection Time    01/13/13  8:30 PM      Result Value Range Status   Specimen Description BLOOD RIGHT ARM   Final   Special Requests BOTTLES DRAWN AEROBIC AND ANAEROBIC 10CC   Final   Culture  Setup Time     Final   Value: 01/14/2013 01:08     Performed at [facility]   Culture     Final   Value:        BLOOD CULTURE RECEIVED NO GROWTH TO DATE CULTURE WILL BE HELD FOR 5 DAYS BEFORE ISSUING A FINAL NEGATIVE REPORT     Performed at [facility]   Report Status PENDING   Incomplete  CULTURE, BLOOD (ROUTINE X 2)     Status: None   Collection Time    01/13/13  8:40 PM      Result Value Range  Status   Specimen Description BLOOD RIGHT HAND   Final   Special Requests BOTTLES DRAWN AEROBIC AND ANAEROBIC 10CC   Final   Culture  Setup Time     Final   Value: 01/14/2013 01:07     Performed at [facility]   Culture     Final   Value:        BLOOD CULTURE RECEIVED NO GROWTH TO DATE CULTURE WILL BE HELD FOR 5 DAYS BEFORE ISSUING A FINAL NEGATIVE REPORT     Performed at [facility]   Report Status PENDING   Incomplete     Labs: Basic Metabolic Panel:  Recent Labs Lab 01/13/13 1504 01/14/13 0540 01/16/13 0455  NA 129* 132* 136  K 4.1 4.4 4.2  CL 92* 99 98  CO2 27 29 30   GLUCOSE 132* 102* 112*  BUN 17 16 17   CREATININE 0.92 1.08 0.93  CALCIUM 8.6 8.3* 8.6  MG 1.5  --   --    Liver Function Tests:  Recent Labs Lab 01/13/13 1504  AST 27  ALT 16  ALKPHOS 35*  BILITOT 0.4  PROT 6.9  ALBUMIN 3.4*   No results found for this basename: LIPASE, AMYLASE,  in the last 168 hours No results found for this basename: AMMONIA,  in the last 168 hours CBC:  Recent Labs Lab 01/13/13 1504 01/14/13 0540 01/15/13 0440 01/16/13 0455  WBC 9.2 7.2 6.5 6.0  NEUTROABS 7.8*  --   --   --   HGB 11.7* 10.8* 10.7* 10.6*  HCT 34.0* 32.1* 31.0* 31.2*  MCV 96.9 99.4 97.2 97.5  PLT 195 174 170 182   Cardiac Enzymes:  Recent Labs Lab 01/13/13 1504  TROPONINI <0.30   BNP: BNP (last 3 results) No results found for this basename: PROBNP,  in the last 8760 hours CBG: No results found for this basename: GLUCAP,  in the last 168 hours     Signed:  Antonio Creswell A  [facility] 01/16/2013, 10:23 AM

## 2013-01-16 NOTE — Progress Notes (Signed)
01/16/13 Patient being discharged home today.Discharge instructions reviewed with patient.

## 2013-01-19 ENCOUNTER — Ambulatory Visit (HOSPITAL_COMMUNITY)
Admission: RE | Admit: 2013-01-19 | Discharge: 2013-01-19 | Disposition: A | Discharge status: Home / Self Care | Payer: Medicare Other | Source: Ambulatory Visit | Attending: Internal Medicine | Admitting: Internal Medicine

## 2013-01-19 DIAGNOSIS — Z1231 Encounter for screening mammogram for malignant neoplasm of breast: Secondary | ICD-10-CM | POA: Insufficient documentation

## 2013-01-20 LAB — CULTURE, BLOOD (ROUTINE X 2)
Culture: NO GROWTH
Culture: NO GROWTH

## 2013-01-21 DIAGNOSIS — R5381 Other malaise: Secondary | ICD-10-CM | POA: Diagnosis not present

## 2013-01-21 DIAGNOSIS — E782 Mixed hyperlipidemia: Secondary | ICD-10-CM | POA: Diagnosis not present

## 2013-01-21 DIAGNOSIS — I10 Essential (primary) hypertension: Secondary | ICD-10-CM | POA: Diagnosis not present

## 2013-01-21 DIAGNOSIS — J189 Pneumonia, unspecified organism: Secondary | ICD-10-CM | POA: Diagnosis not present

## 2013-04-06 DIAGNOSIS — M069 Rheumatoid arthritis, unspecified: Secondary | ICD-10-CM | POA: Diagnosis not present

## 2013-04-06 DIAGNOSIS — E782 Mixed hyperlipidemia: Secondary | ICD-10-CM | POA: Diagnosis not present

## 2013-04-06 DIAGNOSIS — R7301 Impaired fasting glucose: Secondary | ICD-10-CM | POA: Diagnosis not present

## 2013-04-06 DIAGNOSIS — I10 Essential (primary) hypertension: Secondary | ICD-10-CM | POA: Diagnosis not present

## 2013-04-13 DIAGNOSIS — R7301 Impaired fasting glucose: Secondary | ICD-10-CM | POA: Diagnosis not present

## 2013-04-13 DIAGNOSIS — E782 Mixed hyperlipidemia: Secondary | ICD-10-CM | POA: Diagnosis not present

## 2013-04-13 DIAGNOSIS — I10 Essential (primary) hypertension: Secondary | ICD-10-CM | POA: Diagnosis not present

## 2013-05-04 DIAGNOSIS — M069 Rheumatoid arthritis, unspecified: Secondary | ICD-10-CM | POA: Diagnosis not present

## 2013-05-04 DIAGNOSIS — M159 Polyosteoarthritis, unspecified: Secondary | ICD-10-CM | POA: Diagnosis not present

## 2013-07-06 DIAGNOSIS — M069 Rheumatoid arthritis, unspecified: Secondary | ICD-10-CM | POA: Diagnosis not present

## 2013-08-03 DIAGNOSIS — R7402 Elevation of levels of lactic acid dehydrogenase (LDH): Secondary | ICD-10-CM | POA: Diagnosis not present

## 2013-08-03 DIAGNOSIS — D509 Iron deficiency anemia, unspecified: Secondary | ICD-10-CM | POA: Diagnosis not present

## 2013-08-03 DIAGNOSIS — R109 Unspecified abdominal pain: Secondary | ICD-10-CM | POA: Diagnosis not present

## 2013-09-03 DIAGNOSIS — M069 Rheumatoid arthritis, unspecified: Secondary | ICD-10-CM | POA: Diagnosis not present

## 2013-09-03 DIAGNOSIS — M79609 Pain in unspecified limb: Secondary | ICD-10-CM | POA: Diagnosis not present

## 2013-09-03 DIAGNOSIS — M159 Polyosteoarthritis, unspecified: Secondary | ICD-10-CM | POA: Diagnosis not present

## 2013-10-05 DIAGNOSIS — I10 Essential (primary) hypertension: Secondary | ICD-10-CM | POA: Diagnosis not present

## 2013-10-05 DIAGNOSIS — Z0000 Encounter for general adult medical examination without abnormal findings: Secondary | ICD-10-CM | POA: Diagnosis not present

## 2013-10-05 DIAGNOSIS — R7301 Impaired fasting glucose: Secondary | ICD-10-CM | POA: Diagnosis not present

## 2013-10-05 DIAGNOSIS — E782 Mixed hyperlipidemia: Secondary | ICD-10-CM | POA: Diagnosis not present

## 2013-10-05 DIAGNOSIS — Z1331 Encounter for screening for depression: Secondary | ICD-10-CM | POA: Diagnosis not present

## 2013-10-12 DIAGNOSIS — I10 Essential (primary) hypertension: Secondary | ICD-10-CM | POA: Diagnosis not present

## 2013-10-12 DIAGNOSIS — F0390 Unspecified dementia without behavioral disturbance: Secondary | ICD-10-CM | POA: Diagnosis not present

## 2013-10-12 DIAGNOSIS — E782 Mixed hyperlipidemia: Secondary | ICD-10-CM | POA: Diagnosis not present

## 2013-10-12 DIAGNOSIS — I359 Nonrheumatic aortic valve disorder, unspecified: Secondary | ICD-10-CM | POA: Diagnosis not present

## 2014-01-11 DIAGNOSIS — M0589 Other rheumatoid arthritis with rheumatoid factor of multiple sites: Secondary | ICD-10-CM | POA: Diagnosis not present

## 2014-01-11 DIAGNOSIS — M150 Primary generalized (osteo)arthritis: Secondary | ICD-10-CM | POA: Diagnosis not present

## 2014-01-15 ENCOUNTER — Other Ambulatory Visit (HOSPITAL_COMMUNITY): Payer: Self-pay | Admitting: Internal Medicine

## 2014-01-15 DIAGNOSIS — Z1231 Encounter for screening mammogram for malignant neoplasm of breast: Secondary | ICD-10-CM

## 2014-01-21 ENCOUNTER — Ambulatory Visit (HOSPITAL_COMMUNITY)
Admission: RE | Admit: 2014-01-21 | Discharge: 2014-01-21 | Disposition: A | Discharge status: Home / Self Care | Payer: Medicare Other | Source: Ambulatory Visit | Attending: Internal Medicine | Admitting: Internal Medicine

## 2014-01-21 DIAGNOSIS — Z1231 Encounter for screening mammogram for malignant neoplasm of breast: Secondary | ICD-10-CM | POA: Diagnosis not present

## 2014-01-28 DIAGNOSIS — H2513 Age-related nuclear cataract, bilateral: Secondary | ICD-10-CM | POA: Diagnosis not present

## 2014-03-22 DIAGNOSIS — I10 Essential (primary) hypertension: Secondary | ICD-10-CM | POA: Diagnosis not present

## 2014-03-22 DIAGNOSIS — E782 Mixed hyperlipidemia: Secondary | ICD-10-CM | POA: Diagnosis not present

## 2014-03-29 DIAGNOSIS — E782 Mixed hyperlipidemia: Secondary | ICD-10-CM | POA: Diagnosis not present

## 2014-03-29 DIAGNOSIS — R7301 Impaired fasting glucose: Secondary | ICD-10-CM | POA: Diagnosis not present

## 2014-03-29 DIAGNOSIS — I10 Essential (primary) hypertension: Secondary | ICD-10-CM | POA: Diagnosis not present

## 2014-03-29 DIAGNOSIS — Z23 Encounter for immunization: Secondary | ICD-10-CM | POA: Diagnosis not present

## 2014-06-07 DIAGNOSIS — M150 Primary generalized (osteo)arthritis: Secondary | ICD-10-CM | POA: Diagnosis not present

## 2014-06-07 DIAGNOSIS — M0589 Other rheumatoid arthritis with rheumatoid factor of multiple sites: Secondary | ICD-10-CM | POA: Diagnosis not present

## 2014-09-06 DIAGNOSIS — M0589 Other rheumatoid arthritis with rheumatoid factor of multiple sites: Secondary | ICD-10-CM | POA: Diagnosis not present

## 2014-10-26 DIAGNOSIS — I10 Essential (primary) hypertension: Secondary | ICD-10-CM | POA: Diagnosis not present

## 2014-10-26 DIAGNOSIS — F0390 Unspecified dementia without behavioral disturbance: Secondary | ICD-10-CM | POA: Diagnosis not present

## 2014-10-26 DIAGNOSIS — M150 Primary generalized (osteo)arthritis: Secondary | ICD-10-CM | POA: Diagnosis not present

## 2014-10-26 DIAGNOSIS — I350 Nonrheumatic aortic (valve) stenosis: Secondary | ICD-10-CM | POA: Diagnosis not present

## 2014-10-26 DIAGNOSIS — E782 Mixed hyperlipidemia: Secondary | ICD-10-CM | POA: Diagnosis not present

## 2014-10-26 DIAGNOSIS — R9431 Abnormal electrocardiogram [ECG] [EKG]: Secondary | ICD-10-CM | POA: Diagnosis not present

## 2014-10-26 DIAGNOSIS — R7301 Impaired fasting glucose: Secondary | ICD-10-CM | POA: Diagnosis not present

## 2014-10-26 DIAGNOSIS — Z23 Encounter for immunization: Secondary | ICD-10-CM | POA: Diagnosis not present

## 2014-11-01 DIAGNOSIS — Z23 Encounter for immunization: Secondary | ICD-10-CM | POA: Diagnosis not present

## 2014-11-01 DIAGNOSIS — I350 Nonrheumatic aortic (valve) stenosis: Secondary | ICD-10-CM | POA: Diagnosis not present

## 2014-11-01 DIAGNOSIS — R7301 Impaired fasting glucose: Secondary | ICD-10-CM | POA: Diagnosis not present

## 2014-11-01 DIAGNOSIS — E782 Mixed hyperlipidemia: Secondary | ICD-10-CM | POA: Diagnosis not present

## 2014-11-01 DIAGNOSIS — I10 Essential (primary) hypertension: Secondary | ICD-10-CM | POA: Diagnosis not present

## 2014-12-21 DIAGNOSIS — M150 Primary generalized (osteo)arthritis: Secondary | ICD-10-CM | POA: Diagnosis not present

## 2014-12-21 DIAGNOSIS — M0589 Other rheumatoid arthritis with rheumatoid factor of multiple sites: Secondary | ICD-10-CM | POA: Diagnosis not present

## 2015-01-17 ENCOUNTER — Other Ambulatory Visit: Payer: Self-pay

## 2015-01-17 DIAGNOSIS — Z1231 Encounter for screening mammogram for malignant neoplasm of breast: Secondary | ICD-10-CM

## 2015-02-18 ENCOUNTER — Ambulatory Visit
Admission: RE | Admit: 2015-02-18 | Discharge: 2015-02-18 | Disposition: A | Discharge status: Home / Self Care | Payer: Medicare Other | Source: Ambulatory Visit

## 2015-02-18 DIAGNOSIS — Z1231 Encounter for screening mammogram for malignant neoplasm of breast: Secondary | ICD-10-CM | POA: Diagnosis not present

## 2015-03-07 DIAGNOSIS — H2513 Age-related nuclear cataract, bilateral: Secondary | ICD-10-CM | POA: Diagnosis not present

## 2015-03-14 ENCOUNTER — Ambulatory Visit (INDEPENDENT_AMBULATORY_CARE_PROVIDER_SITE_OTHER): Payer: Medicare Other

## 2015-03-14 ENCOUNTER — Ambulatory Visit (INDEPENDENT_AMBULATORY_CARE_PROVIDER_SITE_OTHER): Payer: Medicare Other | Admitting: Podiatry

## 2015-03-14 ENCOUNTER — Encounter: Payer: Self-pay | Admitting: Podiatry

## 2015-03-14 DIAGNOSIS — L24A9 Irritant contact dermatitis due friction or contact with other specified body fluids: Secondary | ICD-10-CM

## 2015-03-14 DIAGNOSIS — M79674 Pain in right toe(s): Secondary | ICD-10-CM | POA: Diagnosis not present

## 2015-03-14 DIAGNOSIS — E039 Hypothyroidism, unspecified: Secondary | ICD-10-CM | POA: Diagnosis not present

## 2015-03-14 DIAGNOSIS — R52 Pain, unspecified: Secondary | ICD-10-CM

## 2015-03-14 DIAGNOSIS — T148 Other injury of unspecified body region: Secondary | ICD-10-CM

## 2015-03-14 DIAGNOSIS — Q828 Other specified congenital malformations of skin: Secondary | ICD-10-CM

## 2015-03-14 DIAGNOSIS — M79675 Pain in left toe(s): Secondary | ICD-10-CM

## 2015-03-14 DIAGNOSIS — E782 Mixed hyperlipidemia: Secondary | ICD-10-CM | POA: Diagnosis not present

## 2015-03-14 DIAGNOSIS — I10 Essential (primary) hypertension: Secondary | ICD-10-CM | POA: Diagnosis not present

## 2015-03-14 DIAGNOSIS — T148XXA Other injury of unspecified body region, initial encounter: Secondary | ICD-10-CM

## 2015-03-14 DIAGNOSIS — B351 Tinea unguium: Secondary | ICD-10-CM

## 2015-03-14 DIAGNOSIS — R7301 Impaired fasting glucose: Secondary | ICD-10-CM | POA: Diagnosis not present

## 2015-03-14 MED ORDER — CEPHALEXIN 500 MG PO CAPS: 500.0000 mg | ORAL_CAPSULE | Freq: Three times a day (TID) | ORAL | Status: DC

## 2015-03-15 NOTE — Progress Notes (Signed)
Subjective:     Patient ID: [name] [name], female   DOB: 1928/07/09, 82 y.o.   MRN: [id number]  HPI 82 year old female presents the office today for thick, painful, elongated toenails which she cannot trim herself as well as for painful corn on the right second toe. She denies any redness or drainage from the toenail with a callus sites. Symptoms have been worsening over the last month.No recent injury or trauma. No other complaints.  Review of Systems  All other systems reviewed and are negative.      Objective:   Physical Exam General: AAO x3, NAD  Dermatological: nails hypertrophic, dystrophic, brittle, discolored, elongated 10. There is no swelling erythema or drainage. There is tenderness to palpation to the nails 1-5 bilaterally. There is a thick hyperkeratotic lesion to the distal aspect of the right second digit. Upon debridement there is a small modest serous drainage expressed have no pus. There is no edema, erythema, increase in warmth. Hyperkeratotic lesion left third distal toe. Upon debridement no underlying ulceration, drainage or other signs of infection.  Vascular: Dorsalis Pedis artery and Posterior Tibial artery pedal pulses are 2/4 bilateral with immedate capillary fill time. There is no pain with calf compression, swelling, warmth, erythema.   Neruologic: Grossly intact via light touch bilateral. Vibratory intact via tuning fork bilateral. Protective threshold with Semmes Wienstein monofilament intact to all pedal sites bilateral. Patellar and Achilles deep tendon reflexes 2+ bilateral. No Babinski or clonus noted bilateral.   Musculoskeletal: hammertoes are present.No pain, crepitus, or limitation noted with foot and ankle range of motion bilateral. Muscular strength 5/5 in all groups tested bilateral.  Gait: Unassisted, Nonantalgic.      Assessment:     Symptomatic onychomycosis, hyperkeratotic lesion with localized infection right second toe     Plan:      -Treatment options discussed including all alternatives, risks, and complications -Etiology of symptoms were discussed -X-rays were obtained and reviewed with the patient. No definitive evidence of acute fracture or stress fracture and there is no cortical destruction to suggest osteomyelitis - Nails debrided 10 without complications or bleeding  -hyperkeratotic lesions sharply debrided 2 without bleeding. Upon debridement of the right second toe the small amount of drainage present. We'll start Keflex in case of infection. X-rays negative. Continue antibiotic appointment dressing changes daily. -Monitor for any clinical signs or symptoms of infection and directed to call the office immediately should any occur or go to the ER. -Follow-up as scheduled to recheck right 2nd toe or sooner if any problems arise. In the meantime, encouraged to call the office with any questions, concerns, change in symptoms.   Celesta Gentile, DPM

## 2015-03-21 DIAGNOSIS — I10 Essential (primary) hypertension: Secondary | ICD-10-CM | POA: Diagnosis not present

## 2015-03-21 DIAGNOSIS — R7301 Impaired fasting glucose: Secondary | ICD-10-CM | POA: Diagnosis not present

## 2015-03-21 DIAGNOSIS — E782 Mixed hyperlipidemia: Secondary | ICD-10-CM | POA: Diagnosis not present

## 2015-03-21 DIAGNOSIS — M25572 Pain in left ankle and joints of left foot: Secondary | ICD-10-CM | POA: Diagnosis not present

## 2015-03-22 ENCOUNTER — Encounter: Payer: Self-pay | Admitting: Podiatry

## 2015-03-22 ENCOUNTER — Telehealth: Payer: Self-pay | Admitting: *Deleted

## 2015-03-22 ENCOUNTER — Ambulatory Visit (INDEPENDENT_AMBULATORY_CARE_PROVIDER_SITE_OTHER): Payer: Medicare Other

## 2015-03-22 ENCOUNTER — Ambulatory Visit (INDEPENDENT_AMBULATORY_CARE_PROVIDER_SITE_OTHER): Payer: Medicare Other | Admitting: Podiatry

## 2015-03-22 VITALS — BP 151/93 | HR 81 | Temp 97.1°F | Resp 18

## 2015-03-22 DIAGNOSIS — R52 Pain, unspecified: Secondary | ICD-10-CM

## 2015-03-22 DIAGNOSIS — M10172 Lead-induced gout, left ankle and foot: Secondary | ICD-10-CM | POA: Diagnosis not present

## 2015-03-22 DIAGNOSIS — T560X1A Toxic effect of lead and its compounds, accidental (unintentional), initial encounter: Secondary | ICD-10-CM | POA: Diagnosis not present

## 2015-03-22 DIAGNOSIS — M10072 Idiopathic gout, left ankle and foot: Secondary | ICD-10-CM | POA: Diagnosis not present

## 2015-03-22 DIAGNOSIS — M109 Gout, unspecified: Secondary | ICD-10-CM

## 2015-03-22 MED ORDER — METHYLPREDNISOLONE 4 MG PO TBPK: ORAL_TABLET | ORAL | Status: DC

## 2015-03-22 NOTE — Telephone Encounter (Signed)
Pt states she is scheduled to see Dr. Jacqualyn Posey 04/04/2015, but her ankle is so painful she would like an earlier appt.

## 2015-03-23 LAB — CBC WITH DIFFERENTIAL/PLATELET
Basophils Absolute: 0 10*3/uL (ref 0.0–0.1)
Basophils Relative: 0 % (ref 0–1)
Eosinophils Absolute: 0.1 10*3/uL (ref 0.0–0.7)
Eosinophils Relative: 1 % (ref 0–5)
HCT: 34.9 % — ABNORMAL LOW (ref 36.0–46.0)
Hemoglobin: 11.5 g/dL — ABNORMAL LOW (ref 12.0–15.0)
Lymphocytes Relative: 20 % (ref 12–46)
Lymphs Abs: 1.9 10*3/uL (ref 0.7–4.0)
MCH: 31.7 pg (ref 26.0–34.0)
MCHC: 33 g/dL (ref 30.0–36.0)
MCV: 96.1 fL (ref 78.0–100.0)
MPV: 8.8 fL (ref 8.6–12.4)
Monocytes Absolute: 1.1 10*3/uL — ABNORMAL HIGH (ref 0.1–1.0)
Monocytes Relative: 11 % (ref 3–12)
Neutro Abs: 6.6 10*3/uL (ref 1.7–7.7)
Neutrophils Relative %: 68 % (ref 43–77)
Platelets: 286 10*3/uL (ref 150–400)
RBC: 3.63 MIL/uL — ABNORMAL LOW (ref 3.87–5.11)
RDW: 13.8 % (ref 11.5–15.5)
WBC: 9.7 10*3/uL (ref 4.0–10.5)

## 2015-03-23 LAB — C-REACTIVE PROTEIN: CRP: 2.4 mg/dL — ABNORMAL HIGH (ref <0.60)

## 2015-03-23 LAB — URIC ACID: Uric Acid, Serum: 5.6 mg/dL (ref 2.4–7.0)

## 2015-03-23 LAB — SEDIMENTATION RATE: Sed Rate: 61 mm/hr — ABNORMAL HIGH (ref 0–30)

## 2015-03-24 NOTE — Progress Notes (Signed)
Patient ID: [name] [name], female   DOB: 11-04-1928, 82 y.o.   MRN: [id number]  Subjective: 82 year old female presents today for concerns  Of pain to her left foot and ankle along the medial aspect which started suddenly over the weekend. She states that she's had redness and swelling and warmth to the and support her ankle. She denies any recent injury or trauma. She denies a history of gout or any recent change in diet. Denies any open sores or drainage. She denies any systemic complaints such as fevers, chills, nausea, vomiting. There is no calf pain, chest pain, shortness of breath.  She has been on Keflex for infection right second toe which she states is doing well without any drainage or redness or swelling.  Objective: AAO 3, NAD DP/PT pulses 2/4, CRT less than 3 seconds  Protective sensation intact with Simms Weinstein monofilament Over the medial aspect of the left ankle there is a localized are of erythema and edema.  There is no fluctuance or crepitus. There is tenderness overlying this area directly. There is slight discomfort with ankle joint range of motion. There is no specific area pinpoint bony tenderness or pain the vibratory sensation. Subjective there is pain to this area weightbearing. No open lesions present lesions overlying this area. On the right second toe the area of the previous wound has healed and there is no ulceration, drainage, edema, erythema or increased warmth or other clinical signs of infection. No pain with calf compression, swelling, warmth, erythema.  Assessment: 82 year old female left ankle likely gout;  Resolved infection right second toe  Plan: -Treatment options discussed including all alternatives, risks, and complication -Etiology of symptoms were discussed -X-rays were obtained and reviewed with the patient.  -Will obtain blood work including ESR, CRP, uric acid, WBC -Medrol dose pack. If symptoms continue will start colchicine.  -Follow-up as  scheduled or sooner if any problems arise. In the meantime, encouraged to call the office with any questions, concerns, change in symptoms.   Celesta Gentile, DPM

## 2015-04-04 ENCOUNTER — Ambulatory Visit: Payer: Medicare Other | Admitting: Podiatry

## 2015-04-04 ENCOUNTER — Ambulatory Visit (INDEPENDENT_AMBULATORY_CARE_PROVIDER_SITE_OTHER): Payer: Medicare Other | Admitting: Podiatry

## 2015-04-04 ENCOUNTER — Encounter: Payer: Self-pay | Admitting: Podiatry

## 2015-04-04 VITALS — BP 144/67 | HR 70 | Resp 16

## 2015-04-04 DIAGNOSIS — M79674 Pain in right toe(s): Secondary | ICD-10-CM | POA: Diagnosis not present

## 2015-04-04 DIAGNOSIS — Q828 Other specified congenital malformations of skin: Secondary | ICD-10-CM | POA: Diagnosis not present

## 2015-04-04 DIAGNOSIS — M79675 Pain in left toe(s): Secondary | ICD-10-CM | POA: Diagnosis not present

## 2015-04-04 DIAGNOSIS — M10072 Idiopathic gout, left ankle and foot: Secondary | ICD-10-CM | POA: Diagnosis not present

## 2015-04-04 DIAGNOSIS — R609 Edema, unspecified: Secondary | ICD-10-CM

## 2015-04-04 DIAGNOSIS — M109 Gout, unspecified: Secondary | ICD-10-CM

## 2015-04-04 NOTE — Progress Notes (Signed)
Patient ID: [name] [name], female   DOB: 1928-08-27, 82 y.o.   MRN: [id number]  Subjective: 82 year old female presents the office for follow-up evaluation of likely gout the left ankle as well as pain to her right second toe. She states that the pain in the redness on the left ankle has significantly improved compared to last appointment. She is finished her steroids. She brought and the prescription of steroids into the office today that her primary care position, gave her the day before I gave her steroid. She did not take that prescription. She has a sister right second toe is been somewhat painful the tip of the toe. Denies any swelling or redness or any drainage. Denies any systemic complaints such as fevers, chills, nausea, vomiting. No acute changes since last appointment, and no other complaints at this time.   Objective: AAO x3, NAD DP/PT pulses palpable bilaterally, CRT less than 3 seconds There is mild edema residual on the medial aspect the left ankle however it disappeared be greatly improved compared to last opponent. There is no overlying erythema or increase in warmth. There is no significant tenderness to palpation overlying this area as well. Ankle, subtalar joint range of motion is intact. On the right side hyperkeratotic lesions present along the distal aspect of the second toe there is mild hammertoe contracture. Upon debridement there is no underlying ulceration, drainage or other signs of infection. No areas of pinpoint bony tenderness or pain with vibratory sensation. MMT 5/5, ROM WNL. No edema, erythema, increase in warmth to bilateral lower extremities.  No open lesions or pre-ulcerative lesions.  No pain with calf compression, swelling, warmth, erythema  Assessment: Resolving gout left ankle, pre-ulcerative callus right second toe  Plan: -All treatment options discussed with the patient including all alternatives, risks, complications.  -Bloodwork is reviewed today her  inflammation markers were elevated now we'll repeat the ESR and CRP today. -Compression sock to help control the residual swelling to the left ankle. -Pre-ulcerative callus of the right second toe debrided without complications or bleeding. Offloading pads dispensed. Monitor for any skin breakdown. -Follow-up as scheduled. -Patient encouraged to call the office with any questions, concerns, change in symptoms.   Celesta Gentile, DPM

## 2015-04-20 DIAGNOSIS — R609 Edema, unspecified: Secondary | ICD-10-CM | POA: Diagnosis not present

## 2015-04-20 DIAGNOSIS — M150 Primary generalized (osteo)arthritis: Secondary | ICD-10-CM | POA: Diagnosis not present

## 2015-04-20 DIAGNOSIS — M0589 Other rheumatoid arthritis with rheumatoid factor of multiple sites: Secondary | ICD-10-CM | POA: Diagnosis not present

## 2015-04-20 DIAGNOSIS — Z79899 Other long term (current) drug therapy: Secondary | ICD-10-CM | POA: Diagnosis not present

## 2015-04-21 ENCOUNTER — Telehealth: Payer: Self-pay | Admitting: *Deleted

## 2015-04-21 LAB — SEDIMENTATION RATE: Sed Rate: 26 mm/hr (ref 0–30)

## 2015-04-21 LAB — C-REACTIVE PROTEIN: CRP: <0.5 mg/dL (ref <0.60)

## 2015-04-21 NOTE — Telephone Encounter (Addendum)
-----   Message from Trula Slade, DPM sent at 04/21/2015  7:18 AM EST ----- Can you please let her know her blood work has improved. Thanks. 04/21/2015-LEFT MESSAGE ON PT'S MOBILE to call for lab results.

## 2015-05-02 ENCOUNTER — Telehealth: Payer: Self-pay | Admitting: *Deleted

## 2015-05-02 ENCOUNTER — Ambulatory Visit (INDEPENDENT_AMBULATORY_CARE_PROVIDER_SITE_OTHER): Payer: Medicare Other | Admitting: Podiatry

## 2015-05-02 ENCOUNTER — Encounter: Payer: Self-pay | Admitting: Podiatry

## 2015-05-02 VITALS — BP 127/54 | HR 68 | Resp 18

## 2015-05-02 DIAGNOSIS — M2040 Other hammer toe(s) (acquired), unspecified foot: Secondary | ICD-10-CM | POA: Diagnosis not present

## 2015-05-02 DIAGNOSIS — M10072 Idiopathic gout, left ankle and foot: Secondary | ICD-10-CM

## 2015-05-02 DIAGNOSIS — M109 Gout, unspecified: Secondary | ICD-10-CM

## 2015-05-02 DIAGNOSIS — L84 Corns and callosities: Secondary | ICD-10-CM

## 2015-05-02 DIAGNOSIS — R609 Edema, unspecified: Secondary | ICD-10-CM

## 2015-05-02 NOTE — Progress Notes (Signed)
Patient ID: [name] [name], female   DOB: 05-17-1928, 83 y.o.   MRN: [id number]   Subjective: 82 year old female presents the office for follow-up evaluation of swelling and pain to her left ankle. She states that since wearing the compression sock to help quite a bit. She has continued pain along calluses the tips of her toes. She's tried wearing off and padded she states that it was uncomfortable to wear. Denies any open sores denies any drainage or redness or any pus or swelling. Denies any systemic complaints such as fevers, chills, nausea, vomiting. No acute changes since last appointment, and no other complaints at this time.   Objective: AAO x3, NAD DP/PT pulses palpable bilaterally, CRT less than 3 seconds There is mild edema residual on the medial aspect the left ankle however it is greatly impaired what it was prior. There is no erythema or increase in warmth. There is no pain with ankle or subtalar joint range of motion. Rigid hammertoe contractures are present. There is hyperkeratotic lesions the distal aspect the left second and third of the right second toe. Upon debridement no underlying ulceration, drainage or other signs of infection. No edema, erythema, increase in warmth to bilateral lower extremities.  No open lesions or pre-ulcerative lesions.  No pain with calf compression, swelling, warmth, erythema  Assessment: Resolving gout left ankle, pre-ulcerative callus bilateral toes due to undergone hammertoe  Plan: -All treatment options discussed with the patient including all alternatives, risks, complications.  -Bloodwork has greatly improved. The [place] ankles also improved. Continue with ankle compression stocking.  -Pre-ulcerative calluses were debrided without complications or bleeding. -Offloading pads were further dispensed today.  -Follow-up in 3 months for routine care or sooner if it is any issues are any worsening symptoms. or sooner if any problems arise. In the  meantime, encouraged to call the office with any questions, concerns, change in symptoms.    Celesta Gentile, DPM

## 2015-05-04 NOTE — Telephone Encounter (Addendum)
-----   Message from Trula Slade, DPM sent at 05/02/2015  1:16 PM EDT ----- Can you please fax her lab work to her PCP.  05/04/2015-I spoke with Levander Campion and asked for pt's PCP, she states pt sees Dr. Merrilee Seashore at [phone].  Faxed 04/20/2015 SED rate and CRP results to [phone].

## 2015-08-09 DIAGNOSIS — F0390 Unspecified dementia without behavioral disturbance: Secondary | ICD-10-CM | POA: Diagnosis not present

## 2015-08-09 DIAGNOSIS — F411 Generalized anxiety disorder: Secondary | ICD-10-CM | POA: Diagnosis not present

## 2015-08-09 DIAGNOSIS — F429 Obsessive-compulsive disorder, unspecified: Secondary | ICD-10-CM | POA: Diagnosis not present

## 2015-08-12 ENCOUNTER — Encounter: Payer: Self-pay | Admitting: Podiatry

## 2015-08-12 ENCOUNTER — Ambulatory Visit (INDEPENDENT_AMBULATORY_CARE_PROVIDER_SITE_OTHER): Payer: Medicare Other | Admitting: Podiatry

## 2015-08-12 DIAGNOSIS — L84 Corns and callosities: Secondary | ICD-10-CM | POA: Diagnosis not present

## 2015-08-12 DIAGNOSIS — M79675 Pain in left toe(s): Secondary | ICD-10-CM

## 2015-08-12 DIAGNOSIS — B351 Tinea unguium: Secondary | ICD-10-CM | POA: Diagnosis not present

## 2015-08-12 DIAGNOSIS — M79674 Pain in right toe(s): Secondary | ICD-10-CM

## 2015-08-12 NOTE — Progress Notes (Signed)
Patient ID: [name] [name], female   DOB: 1929-01-04, 82 y.o.   MRN: [id number] Complaint:  Visit Type: Patient returns to my office for continued preventative foot care services. Complaint: Patient states" my nails have grown long and thick and become painful to walk and wear shoes" She also has developed painful corns on the tips of toes both feet.. The patient presents for preventative foot care services. No changes to ROS  Podiatric Exam: Vascular: dorsalis pedis and posterior tibial pulses are palpable bilateral. Capillary return is immediate. Temperature gradient is WNL. Skin turgor WNL  Sensorium: Normal Semmes Weinstein monofilament test. Normal tactile sensation bilaterally. Nail Exam: Pt has thick disfigured discolored nails with subungual debris noted bilateral entire nail hallux through fifth toenails Ulcer Exam: There is no evidence of ulcer or pre-ulcerative changes or infection. Orthopedic Exam: Muscle tone and strength are WNL. No limitations in general ROM. No crepitus or effusions noted. Foot type and digits show no abnormalities. Bony prominences are unremarkable. Skin: No Porokeratosis. No infection or ulcers.  Clavi noted tip second toe right foot and third toe left foot.  Diagnosis:  Onychomycosis, , Pain in right toe, pain in left toes,  Clavi  B/L  Treatment & Plan Procedures and Treatment: Consent by patient was obtained for treatment procedures. The patient understood the discussion of treatment and procedures well. All questions were answered thoroughly reviewed. Debridement of mycotic and hypertrophic toenails, 1 through 5 bilateral and clearing of subungual debris. No ulceration, no infection noted. Debride clavi. Return Visit-Office Procedure: Patient instructed to return to the office for a follow up visit 3 months for continued evaluation and treatment.    Gardiner Barefoot DPM

## 2015-09-12 DIAGNOSIS — Z79899 Other long term (current) drug therapy: Secondary | ICD-10-CM | POA: Diagnosis not present

## 2015-09-12 DIAGNOSIS — M150 Primary generalized (osteo)arthritis: Secondary | ICD-10-CM | POA: Diagnosis not present

## 2015-09-12 DIAGNOSIS — F429 Obsessive-compulsive disorder, unspecified: Secondary | ICD-10-CM | POA: Diagnosis not present

## 2015-09-12 DIAGNOSIS — M0589 Other rheumatoid arthritis with rheumatoid factor of multiple sites: Secondary | ICD-10-CM | POA: Diagnosis not present

## 2015-11-09 ENCOUNTER — Encounter: Payer: Self-pay | Admitting: Podiatry

## 2015-11-09 ENCOUNTER — Ambulatory Visit (INDEPENDENT_AMBULATORY_CARE_PROVIDER_SITE_OTHER): Payer: Medicare Other | Admitting: Podiatry

## 2015-11-09 DIAGNOSIS — Z23 Encounter for immunization: Secondary | ICD-10-CM | POA: Diagnosis not present

## 2015-11-09 DIAGNOSIS — M25572 Pain in left ankle and joints of left foot: Secondary | ICD-10-CM | POA: Diagnosis not present

## 2015-11-09 DIAGNOSIS — N390 Urinary tract infection, site not specified: Secondary | ICD-10-CM | POA: Diagnosis not present

## 2015-11-09 DIAGNOSIS — M79674 Pain in right toe(s): Secondary | ICD-10-CM

## 2015-11-09 DIAGNOSIS — M79675 Pain in left toe(s): Secondary | ICD-10-CM

## 2015-11-09 DIAGNOSIS — Z780 Asymptomatic menopausal state: Secondary | ICD-10-CM | POA: Diagnosis not present

## 2015-11-09 DIAGNOSIS — E782 Mixed hyperlipidemia: Secondary | ICD-10-CM | POA: Diagnosis not present

## 2015-11-09 DIAGNOSIS — L84 Corns and callosities: Secondary | ICD-10-CM | POA: Diagnosis not present

## 2015-11-09 DIAGNOSIS — B351 Tinea unguium: Secondary | ICD-10-CM | POA: Diagnosis not present

## 2015-11-09 DIAGNOSIS — Z0000 Encounter for general adult medical examination without abnormal findings: Secondary | ICD-10-CM | POA: Diagnosis not present

## 2015-11-09 DIAGNOSIS — I10 Essential (primary) hypertension: Secondary | ICD-10-CM | POA: Diagnosis not present

## 2015-11-09 DIAGNOSIS — R7301 Impaired fasting glucose: Secondary | ICD-10-CM | POA: Diagnosis not present

## 2015-11-09 NOTE — Progress Notes (Signed)
   Subjective:    Patient ID: [name] [name], female    DOB: Nov 24, 1928, 82 y.o.   MRN: [id number]  HPI     Patient presents complaining of uncomfortable toenails and keratoses and right and left feet. The nails and keratoses were last debrided on the visit of 08/12/2015 by Dr. Prudence Davidson    Review of Systems  All other systems reviewed and are negative.      Objective:   Physical Exam  Chart review dated 06/23/2017Dr. Marlena Clipper Podiatric Exam: Vascular: dorsalis pedis and posterior tibial pulses are palpable bilateral. Capillary return is immediate. Temperature gradient is WNL. Skin turgor WNL  Sensorium: Normal Semmes Weinstein monofilament test. Normal tactile sensation bilaterally. Nail Exam: Pt has thick disfigured discolored nails with subungual debris noted bilateral entire nail hallux through fifth toenails Ulcer Exam: There is no evidence of ulcer or pre-ulcerative changes or infection. Orthopedic Exam: Muscle tone and strength are WNL. No limitations in general ROM. No crepitus or effusions noted. Foot type and digits show no abnormalities. Bony prominences are unremarkable. Skin: No Porokeratosis. No infection or ulcers.  Clavi noted tip second toe right foot and third toe left foot.  Diagnosis:  Onychomycosis, , Pain in right toe, pain in left toes,  Clavi  B/L  Treatment & Plan Procedures and Treatment: Consent by patient was obtained for treatment procedures. The patient understood the discussion of treatment and procedures well. All questions were answered thoroughly reviewed. Debridement of mycotic and hypertrophic toenails, 1 through 5 bilateral and clearing of subungual debris. No ulceration, no infection noted. Debride clavi. Return Visit-Office Procedure: Patient instructed to return to the office for a follow up visit 3 months for continued evaluation and treatment.   Exam 11/09/2015 consistent with above exam      Assessment & Plan:    Assessment: Symptomatic onychomycoses 6-10 Keratoses 3 History of rheumatoid arthritis  Plan: Debridement of toenails 6-10 mechanically and electronically without any bleeding Debrided keratoses 3 without any bleeding Protective pad applied distal second right toe  Reappoint 3 month

## 2015-11-11 ENCOUNTER — Ambulatory Visit: Payer: Medicare Other | Admitting: Podiatry

## 2015-11-14 DIAGNOSIS — F0390 Unspecified dementia without behavioral disturbance: Secondary | ICD-10-CM | POA: Diagnosis not present

## 2015-11-14 DIAGNOSIS — R7301 Impaired fasting glucose: Secondary | ICD-10-CM | POA: Diagnosis not present

## 2015-11-14 DIAGNOSIS — I10 Essential (primary) hypertension: Secondary | ICD-10-CM | POA: Diagnosis not present

## 2015-11-14 DIAGNOSIS — E782 Mixed hyperlipidemia: Secondary | ICD-10-CM | POA: Diagnosis not present

## 2016-03-19 ENCOUNTER — Other Ambulatory Visit: Payer: Self-pay | Admitting: Internal Medicine

## 2016-03-19 DIAGNOSIS — H2513 Age-related nuclear cataract, bilateral: Secondary | ICD-10-CM | POA: Diagnosis not present

## 2016-03-19 DIAGNOSIS — Z1231 Encounter for screening mammogram for malignant neoplasm of breast: Secondary | ICD-10-CM

## 2016-03-19 DIAGNOSIS — H524 Presbyopia: Secondary | ICD-10-CM | POA: Diagnosis not present

## 2016-03-19 DIAGNOSIS — H52201 Unspecified astigmatism, right eye: Secondary | ICD-10-CM | POA: Diagnosis not present

## 2016-03-19 DIAGNOSIS — H5211 Myopia, right eye: Secondary | ICD-10-CM | POA: Diagnosis not present

## 2016-04-09 DIAGNOSIS — M150 Primary generalized (osteo)arthritis: Secondary | ICD-10-CM | POA: Diagnosis not present

## 2016-04-09 DIAGNOSIS — M25561 Pain in right knee: Secondary | ICD-10-CM | POA: Diagnosis not present

## 2016-04-09 DIAGNOSIS — M1712 Unilateral primary osteoarthritis, left knee: Secondary | ICD-10-CM | POA: Diagnosis not present

## 2016-04-09 DIAGNOSIS — M1711 Unilateral primary osteoarthritis, right knee: Secondary | ICD-10-CM | POA: Diagnosis not present

## 2016-04-09 DIAGNOSIS — M0589 Other rheumatoid arthritis with rheumatoid factor of multiple sites: Secondary | ICD-10-CM | POA: Diagnosis not present

## 2016-04-09 DIAGNOSIS — Z79899 Other long term (current) drug therapy: Secondary | ICD-10-CM | POA: Diagnosis not present

## 2016-04-13 ENCOUNTER — Ambulatory Visit
Admission: RE | Admit: 2016-04-13 | Discharge: 2016-04-13 | Disposition: A | Discharge status: Home / Self Care | Payer: Medicare Other | Source: Ambulatory Visit | Attending: Internal Medicine | Admitting: Internal Medicine

## 2016-04-13 DIAGNOSIS — Z1231 Encounter for screening mammogram for malignant neoplasm of breast: Secondary | ICD-10-CM

## 2016-04-23 ENCOUNTER — Ambulatory Visit (INDEPENDENT_AMBULATORY_CARE_PROVIDER_SITE_OTHER): Payer: Medicare Other | Admitting: Podiatry

## 2016-04-23 ENCOUNTER — Encounter: Payer: Self-pay | Admitting: Podiatry

## 2016-04-23 DIAGNOSIS — M79675 Pain in left toe(s): Secondary | ICD-10-CM | POA: Diagnosis not present

## 2016-04-23 DIAGNOSIS — M79674 Pain in right toe(s): Secondary | ICD-10-CM

## 2016-04-23 DIAGNOSIS — B351 Tinea unguium: Secondary | ICD-10-CM | POA: Diagnosis not present

## 2016-04-23 DIAGNOSIS — Q828 Other specified congenital malformations of skin: Secondary | ICD-10-CM | POA: Diagnosis not present

## 2016-04-29 NOTE — Progress Notes (Signed)
Patient ID: Jalesa Thien, female   DOB: 06/13/1928, 83 y.o.   MRN: [id number]  Subjective: 83 y.o. returns the office today for painful, elongated, thickened toenails which they cannot trim themself. Chest is hyperkeratotic lesions to both of her feet on the tips the toes. Denies any redness or drainage around the nails. Denies any acute changes since last appointment and no new complaints today. Denies any systemic complaints such as fevers, chills, nausea, vomiting.   Objective: AAO 3, NAD DP/PT pulses palpable, CRT less than 3 seconds Nails hypertrophic, dystrophic, elongated, brittle, discolored 10. There is tenderness overlying the nails 1-5 bilaterally. There is no surrounding erythema or drainage along the nail sites. Right 2nd and left 3rd distal digits hyperkeratotic tissue. Upon debridement there is no underlying ulceration, drainage or any signs of infection. No open lesions or pre-ulcerative lesions are identified. No other areas of tenderness bilateral lower extremities. No overlying edema, erythema, increased warmth. No pain with calf compression, swelling, warmth, erythema.  Assessment: Patient presents with symptomatic onychomycosis; hyperkeratotic lesions  Plan: -Treatment options including alternatives, risks, complications were discussed -Nails sharply debrided 10 without complication/bleeding. -hyperkeratotic lesion sharply debrided 2 without complications or bleeding. -Discussed daily foot inspection. If there are any changes, to call the office immediately.  -Follow-up in 3 months or sooner if any problems are to arise. In the meantime, encouraged to call the office with any questions, concerns, changes symptoms.  Celesta Gentile, DPM

## 2016-05-07 DIAGNOSIS — E782 Mixed hyperlipidemia: Secondary | ICD-10-CM | POA: Diagnosis not present

## 2016-05-07 DIAGNOSIS — R7301 Impaired fasting glucose: Secondary | ICD-10-CM | POA: Diagnosis not present

## 2016-05-14 DIAGNOSIS — R7301 Impaired fasting glucose: Secondary | ICD-10-CM | POA: Diagnosis not present

## 2016-05-14 DIAGNOSIS — E782 Mixed hyperlipidemia: Secondary | ICD-10-CM | POA: Diagnosis not present

## 2016-05-14 DIAGNOSIS — I10 Essential (primary) hypertension: Secondary | ICD-10-CM | POA: Diagnosis not present

## 2016-05-14 DIAGNOSIS — F0390 Unspecified dementia without behavioral disturbance: Secondary | ICD-10-CM | POA: Diagnosis not present

## 2016-05-20 HISTORY — PX: OTHER SURGICAL HISTORY: SHX169

## 2016-06-21 DIAGNOSIS — H25013 Cortical age-related cataract, bilateral: Secondary | ICD-10-CM | POA: Diagnosis not present

## 2016-06-21 DIAGNOSIS — H52203 Unspecified astigmatism, bilateral: Secondary | ICD-10-CM | POA: Diagnosis not present

## 2016-06-21 DIAGNOSIS — H2513 Age-related nuclear cataract, bilateral: Secondary | ICD-10-CM | POA: Diagnosis not present

## 2016-06-21 DIAGNOSIS — H5213 Myopia, bilateral: Secondary | ICD-10-CM | POA: Diagnosis not present

## 2016-06-21 DIAGNOSIS — H524 Presbyopia: Secondary | ICD-10-CM | POA: Diagnosis not present

## 2016-06-25 ENCOUNTER — Ambulatory Visit (INDEPENDENT_AMBULATORY_CARE_PROVIDER_SITE_OTHER): Payer: PRIVATE HEALTH INSURANCE | Admitting: Podiatry

## 2016-06-25 ENCOUNTER — Encounter: Payer: Self-pay | Admitting: Podiatry

## 2016-06-25 DIAGNOSIS — Q828 Other specified congenital malformations of skin: Secondary | ICD-10-CM

## 2016-06-25 DIAGNOSIS — B351 Tinea unguium: Secondary | ICD-10-CM

## 2016-06-25 DIAGNOSIS — M79675 Pain in left toe(s): Secondary | ICD-10-CM

## 2016-06-25 DIAGNOSIS — M79674 Pain in right toe(s): Secondary | ICD-10-CM

## 2016-06-25 NOTE — Progress Notes (Signed)
Patient ID: [name] [name], female   DOB: Dec 30, 1928, 83 y.o.   MRN: [id number]  Subjective: 83 y.o. returns the office today for painful, elongated, thickened toenails which they cannot trim themself. She also states that she has hyperkeratotic lesions to both of her feet on the tips the toes. Denies any redness or drainage around the nails. Denies any acute changes since last appointment and no new complaints today. Denies any systemic complaints such as fevers, chills, nausea, vomiting.   Objective: AAO 3, NAD DP/PT pulses palpable, CRT less than 3 seconds Nails hypertrophic, dystrophic, elongated, brittle, discolored 10. There is tenderness overlying the nails 1-5 bilaterally. There is no surrounding erythema or drainage along the nail sites. Bilateral 2nd and left 3rd distal digits hyperkeratotic tissue. Upon debridement there is no underlying ulceration, drainage or any signs of infection. No open lesions or pre-ulcerative lesions are identified. No other areas of tenderness bilateral lower extremities. No overlying edema, erythema, increased warmth. No pain with calf compression, swelling, warmth, erythema.  Assessment: Patient presents with symptomatic onychomycosis; hyperkeratotic lesions  Plan: -Treatment options including alternatives, risks, complications were discussed -Nails sharply debrided 10 without complication/bleeding. -hyperkeratotic lesion sharply debrided 4 without complications or bleeding. -Discussed daily foot inspection. If there are any changes, to call the office immediately.  -Follow-up in 3 months or sooner if any problems are to arise. In the meantime, encouraged to call the office with any questions, concerns, changes symptoms.  Celesta Gentile, DPM

## 2016-07-04 DIAGNOSIS — H2511 Age-related nuclear cataract, right eye: Secondary | ICD-10-CM | POA: Diagnosis not present

## 2016-07-04 DIAGNOSIS — H25012 Cortical age-related cataract, left eye: Secondary | ICD-10-CM | POA: Diagnosis not present

## 2016-07-04 DIAGNOSIS — H2512 Age-related nuclear cataract, left eye: Secondary | ICD-10-CM | POA: Diagnosis not present

## 2016-07-04 DIAGNOSIS — H25011 Cortical age-related cataract, right eye: Secondary | ICD-10-CM | POA: Diagnosis not present

## 2016-07-09 DIAGNOSIS — M0589 Other rheumatoid arthritis with rheumatoid factor of multiple sites: Secondary | ICD-10-CM | POA: Diagnosis not present

## 2016-07-09 DIAGNOSIS — Z79899 Other long term (current) drug therapy: Secondary | ICD-10-CM | POA: Diagnosis not present

## 2016-07-09 DIAGNOSIS — M150 Primary generalized (osteo)arthritis: Secondary | ICD-10-CM | POA: Diagnosis not present

## 2016-07-09 DIAGNOSIS — M25561 Pain in right knee: Secondary | ICD-10-CM | POA: Diagnosis not present

## 2016-07-11 DIAGNOSIS — H25012 Cortical age-related cataract, left eye: Secondary | ICD-10-CM | POA: Diagnosis not present

## 2016-07-11 DIAGNOSIS — H2512 Age-related nuclear cataract, left eye: Secondary | ICD-10-CM | POA: Diagnosis not present

## 2016-08-27 ENCOUNTER — Encounter: Payer: Self-pay | Admitting: Podiatry

## 2016-08-27 ENCOUNTER — Ambulatory Visit (INDEPENDENT_AMBULATORY_CARE_PROVIDER_SITE_OTHER): Payer: PPO | Admitting: Podiatry

## 2016-08-27 DIAGNOSIS — Q828 Other specified congenital malformations of skin: Secondary | ICD-10-CM | POA: Diagnosis not present

## 2016-08-27 DIAGNOSIS — M79674 Pain in right toe(s): Secondary | ICD-10-CM | POA: Diagnosis not present

## 2016-08-27 DIAGNOSIS — B351 Tinea unguium: Secondary | ICD-10-CM

## 2016-08-27 DIAGNOSIS — M79675 Pain in left toe(s): Secondary | ICD-10-CM | POA: Diagnosis not present

## 2016-08-31 NOTE — Progress Notes (Signed)
Patient ID: [name] [name], female   DOB: April 27, 1928, 83 y.o.   MRN: [id number]  Subjective: 83 y.o. returns the office today for painful, elongated, thickened toenails which they cannot trim themself. She also states that she has hyperkeratotic lesions to the ball of her right foot which hurts. Denies any redness or drainage around the nails. Denies any acute changes since last appointment and no new complaints today. Denies any systemic complaints such as fevers, chills, nausea, vomiting.   Objective: AAO 3, NAD DP/PT pulses palpable, CRT less than 3 seconds Nails hypertrophic, dystrophic, elongated, brittle, discolored 10. There is tenderness overlying the nails 1-5 bilaterally. There is no surrounding erythema or drainage along the nail sites. Hyperkeratotic lesion to the plantar aspect of the right foot submetatarsal. Upon debridement there is no underlying ulceration, drainage or any signs of infection. There is no significant hyperkeratotic lesions to the digits today. No open lesions or pre-ulcerative lesions are identified. No other areas of tenderness bilateral lower extremities. No overlying edema, erythema, increased warmth. No pain with calf compression, swelling, warmth, erythema.  Assessment: Patient presents with symptomatic onychomycosis; hyperkeratotic lesions  Plan: -Treatment options including alternatives, risks, complications were discussed -Nails sharply debrided 10 without complication/bleeding. -Hyperkeratotic lesion sharply debrided 1 without complications or bleeding. -Discussed daily foot inspection. If there are any changes, to call the office immediately.  -Follow-up in 3 months or sooner if any problems are to arise. In the meantime, encouraged to call the office with any questions, concerns, changes symptoms.  Celesta Gentile, DPM

## 2016-09-03 DIAGNOSIS — M0589 Other rheumatoid arthritis with rheumatoid factor of multiple sites: Secondary | ICD-10-CM | POA: Diagnosis not present

## 2016-09-03 DIAGNOSIS — M25561 Pain in right knee: Secondary | ICD-10-CM | POA: Diagnosis not present

## 2016-09-03 DIAGNOSIS — Z79899 Other long term (current) drug therapy: Secondary | ICD-10-CM | POA: Diagnosis not present

## 2016-09-03 DIAGNOSIS — M150 Primary generalized (osteo)arthritis: Secondary | ICD-10-CM | POA: Diagnosis not present

## 2016-10-29 ENCOUNTER — Ambulatory Visit (INDEPENDENT_AMBULATORY_CARE_PROVIDER_SITE_OTHER): Payer: PPO | Admitting: Podiatry

## 2016-10-29 ENCOUNTER — Encounter: Payer: Self-pay | Admitting: Podiatry

## 2016-10-29 DIAGNOSIS — B351 Tinea unguium: Secondary | ICD-10-CM | POA: Diagnosis not present

## 2016-10-29 DIAGNOSIS — Q828 Other specified congenital malformations of skin: Secondary | ICD-10-CM

## 2016-10-29 DIAGNOSIS — M79675 Pain in left toe(s): Secondary | ICD-10-CM | POA: Diagnosis not present

## 2016-10-29 DIAGNOSIS — M79674 Pain in right toe(s): Secondary | ICD-10-CM | POA: Diagnosis not present

## 2016-10-29 NOTE — Progress Notes (Signed)
Patient ID: Elin Fenley, female   DOB: [birth year], 83 y.o.   MRN: [id number]  Subjective: 83 y.o. returns the office today for painful, elongated, thickened toenails which they cannot trim themself. She also states that she has a callus to the ball of her right foot which hurts. Denies any redness or drainage around the nails. Denies any acute changes since last appointment and no new complaints today. Denies any systemic complaints such as fevers, chills, nausea, vomiting.   Objective: AAO 3, NAD DP/PT pulses palpable, CRT less than 3 seconds Nails hypertrophic, dystrophic, elongated, brittle, discolored 10. There is tenderness overlying the nails 1-5 bilaterally. There is no surrounding erythema or drainage along the nail sites. Hyperkeratotic lesion to the plantar aspect of the right foot submetatarsal. Upon debridement there is no underlying ulceration, drainage or any signs of infection. There is no significant hyperkeratotic lesions to the digits today. No open lesions or pre-ulcerative lesions are identified. No other areas of tenderness bilateral lower extremities. No overlying edema, erythema, increased warmth. No pain with calf compression, swelling, warmth, erythema.  Assessment: Patient presents with symptomatic onychomycosis; hyperkeratotic lesions  Plan: -Treatment options including alternatives, risks, complications were discussed -Nails sharply debrided 10 without complication/bleeding. -Hyperkeratotic lesion sharply debrided 1 without complications or bleeding. -Discussed daily foot inspection. If there are any changes, to call the office immediately.  -Follow-up in 3 months or sooner if any problems are to arise. In the meantime, encouraged to call the office with any questions, concerns, changes symptoms.  Celesta Gentile, DPM

## 2016-11-12 DIAGNOSIS — R7301 Impaired fasting glucose: Secondary | ICD-10-CM | POA: Diagnosis not present

## 2016-11-12 DIAGNOSIS — E782 Mixed hyperlipidemia: Secondary | ICD-10-CM | POA: Diagnosis not present

## 2016-11-12 DIAGNOSIS — Z23 Encounter for immunization: Secondary | ICD-10-CM | POA: Diagnosis not present

## 2016-11-12 DIAGNOSIS — Z0000 Encounter for general adult medical examination without abnormal findings: Secondary | ICD-10-CM | POA: Diagnosis not present

## 2016-11-19 DIAGNOSIS — E782 Mixed hyperlipidemia: Secondary | ICD-10-CM | POA: Diagnosis not present

## 2016-11-19 DIAGNOSIS — I10 Essential (primary) hypertension: Secondary | ICD-10-CM | POA: Diagnosis not present

## 2016-11-19 DIAGNOSIS — R7301 Impaired fasting glucose: Secondary | ICD-10-CM | POA: Diagnosis not present

## 2016-11-19 DIAGNOSIS — F0390 Unspecified dementia without behavioral disturbance: Secondary | ICD-10-CM | POA: Diagnosis not present

## 2016-11-19 DIAGNOSIS — E039 Hypothyroidism, unspecified: Secondary | ICD-10-CM | POA: Diagnosis not present

## 2016-11-19 DIAGNOSIS — I350 Nonrheumatic aortic (valve) stenosis: Secondary | ICD-10-CM | POA: Diagnosis not present

## 2016-11-19 DIAGNOSIS — R9431 Abnormal electrocardiogram [ECG] [EKG]: Secondary | ICD-10-CM | POA: Diagnosis not present

## 2017-01-07 DIAGNOSIS — Z79899 Other long term (current) drug therapy: Secondary | ICD-10-CM | POA: Diagnosis not present

## 2017-01-07 DIAGNOSIS — M150 Primary generalized (osteo)arthritis: Secondary | ICD-10-CM | POA: Diagnosis not present

## 2017-01-07 DIAGNOSIS — M25561 Pain in right knee: Secondary | ICD-10-CM | POA: Diagnosis not present

## 2017-01-07 DIAGNOSIS — M0589 Other rheumatoid arthritis with rheumatoid factor of multiple sites: Secondary | ICD-10-CM | POA: Diagnosis not present

## 2017-01-28 ENCOUNTER — Ambulatory Visit: Payer: PRIVATE HEALTH INSURANCE | Admitting: Podiatry

## 2017-03-25 DIAGNOSIS — Z79899 Other long term (current) drug therapy: Secondary | ICD-10-CM | POA: Diagnosis not present

## 2017-03-25 DIAGNOSIS — M150 Primary generalized (osteo)arthritis: Secondary | ICD-10-CM | POA: Diagnosis not present

## 2017-03-25 DIAGNOSIS — M25561 Pain in right knee: Secondary | ICD-10-CM | POA: Diagnosis not present

## 2017-03-25 DIAGNOSIS — M0589 Other rheumatoid arthritis with rheumatoid factor of multiple sites: Secondary | ICD-10-CM | POA: Diagnosis not present

## 2017-04-02 ENCOUNTER — Ambulatory Visit (INDEPENDENT_AMBULATORY_CARE_PROVIDER_SITE_OTHER): Payer: PPO | Admitting: Podiatry

## 2017-04-02 DIAGNOSIS — M79674 Pain in right toe(s): Secondary | ICD-10-CM

## 2017-04-02 DIAGNOSIS — B351 Tinea unguium: Secondary | ICD-10-CM

## 2017-04-02 DIAGNOSIS — Q828 Other specified congenital malformations of skin: Secondary | ICD-10-CM

## 2017-04-02 DIAGNOSIS — M79675 Pain in left toe(s): Secondary | ICD-10-CM | POA: Diagnosis not present

## 2017-04-07 NOTE — Progress Notes (Signed)
Patient ID: [name] [name], female   DOB: 08-Jun-1928, 84 y.o.   MRN: [id number]  Subjective: 84 y.o. returns the office today for painful, elongated, thickened toenails which they cannot trim themselves as well as for painful calluses.  She states that the toenails and calluses are with pressure in shoes.  No redness or drainage or swelling.  No other concerns today.Denies any systemic complaints such as fevers, chills, nausea, vomiting.   Objective: AAO 3, NAD DP/PT pulses palpable, CRT less than 3 seconds Nails hypertrophic, dystrophic, elongated, brittle, discolored 10. There is tenderness overlying the nails 1-5 bilaterally. There is no surrounding erythema or drainage along the nail sites. Hyperkeratotic lesion to the plantar aspect of the right foot submetatarsal. Upon debridement there is no underlying ulceration, drainage or any signs of infection. There is no significant hyperkeratotic lesions to the digits today. No open lesions or pre-ulcerative lesions are identified. No other areas of tenderness bilateral lower extremities. No overlying edema, erythema, increased warmth. No pain with calf compression, swelling, warmth, erythema.  Assessment: Patient presents with symptomatic onychomycosis; hyperkeratotic lesions  Plan: -Treatment options including alternatives, risks, complications were discussed -Nails sharply debrided 10 without complication/bleeding. -Hyperkeratotic lesion sharply debrided 1 without complications or bleeding. -Discussed daily foot inspection. If there are any changes, to call the office immediately.  -Follow-up in 3 months or sooner if any problems are to arise. In the meantime, encouraged to call the office with any questions, concerns, changes symptoms.  Celesta Gentile, DPM

## 2017-04-11 ENCOUNTER — Other Ambulatory Visit: Payer: Self-pay | Admitting: Internal Medicine

## 2017-04-11 DIAGNOSIS — Z1231 Encounter for screening mammogram for malignant neoplasm of breast: Secondary | ICD-10-CM

## 2017-04-15 ENCOUNTER — Ambulatory Visit: Payer: PRIVATE HEALTH INSURANCE

## 2017-04-22 ENCOUNTER — Ambulatory Visit
Admission: RE | Admit: 2017-04-22 | Discharge: 2017-04-22 | Disposition: A | Discharge status: Home / Self Care | Payer: PRIVATE HEALTH INSURANCE | Source: Ambulatory Visit | Attending: Internal Medicine | Admitting: Internal Medicine

## 2017-04-22 DIAGNOSIS — Z1231 Encounter for screening mammogram for malignant neoplasm of breast: Secondary | ICD-10-CM

## 2017-04-24 ENCOUNTER — Other Ambulatory Visit: Payer: Self-pay | Admitting: Internal Medicine

## 2017-04-24 DIAGNOSIS — R928 Other abnormal and inconclusive findings on diagnostic imaging of breast: Secondary | ICD-10-CM

## 2017-04-26 ENCOUNTER — Ambulatory Visit
Admission: RE | Admit: 2017-04-26 | Discharge: 2017-04-26 | Disposition: A | Discharge status: Home / Self Care | Payer: PPO | Source: Ambulatory Visit | Attending: Internal Medicine | Admitting: Internal Medicine

## 2017-04-26 DIAGNOSIS — R928 Other abnormal and inconclusive findings on diagnostic imaging of breast: Secondary | ICD-10-CM

## 2017-04-26 DIAGNOSIS — R921 Mammographic calcification found on diagnostic imaging of breast: Secondary | ICD-10-CM | POA: Diagnosis not present

## 2017-05-06 DIAGNOSIS — E039 Hypothyroidism, unspecified: Secondary | ICD-10-CM | POA: Diagnosis not present

## 2017-05-06 DIAGNOSIS — E782 Mixed hyperlipidemia: Secondary | ICD-10-CM | POA: Diagnosis not present

## 2017-05-13 DIAGNOSIS — Z23 Encounter for immunization: Secondary | ICD-10-CM | POA: Diagnosis not present

## 2017-05-13 DIAGNOSIS — F0390 Unspecified dementia without behavioral disturbance: Secondary | ICD-10-CM | POA: Diagnosis not present

## 2017-05-13 DIAGNOSIS — I10 Essential (primary) hypertension: Secondary | ICD-10-CM | POA: Diagnosis not present

## 2017-05-13 DIAGNOSIS — E782 Mixed hyperlipidemia: Secondary | ICD-10-CM | POA: Diagnosis not present

## 2017-05-13 DIAGNOSIS — R9431 Abnormal electrocardiogram [ECG] [EKG]: Secondary | ICD-10-CM | POA: Diagnosis not present

## 2017-05-13 DIAGNOSIS — R7301 Impaired fasting glucose: Secondary | ICD-10-CM | POA: Diagnosis not present

## 2017-05-13 DIAGNOSIS — I350 Nonrheumatic aortic (valve) stenosis: Secondary | ICD-10-CM | POA: Diagnosis not present

## 2017-06-26 DIAGNOSIS — M25561 Pain in right knee: Secondary | ICD-10-CM | POA: Diagnosis not present

## 2017-06-26 DIAGNOSIS — M150 Primary generalized (osteo)arthritis: Secondary | ICD-10-CM | POA: Diagnosis not present

## 2017-06-26 DIAGNOSIS — M79642 Pain in left hand: Secondary | ICD-10-CM | POA: Diagnosis not present

## 2017-06-26 DIAGNOSIS — Z79899 Other long term (current) drug therapy: Secondary | ICD-10-CM | POA: Diagnosis not present

## 2017-06-26 DIAGNOSIS — M79641 Pain in right hand: Secondary | ICD-10-CM | POA: Diagnosis not present

## 2017-06-26 DIAGNOSIS — M0589 Other rheumatoid arthritis with rheumatoid factor of multiple sites: Secondary | ICD-10-CM | POA: Diagnosis not present

## 2017-07-02 ENCOUNTER — Ambulatory Visit: Payer: PRIVATE HEALTH INSURANCE | Admitting: Podiatry

## 2017-07-03 ENCOUNTER — Ambulatory Visit (INDEPENDENT_AMBULATORY_CARE_PROVIDER_SITE_OTHER): Payer: PPO | Admitting: Podiatry

## 2017-07-03 DIAGNOSIS — Q828 Other specified congenital malformations of skin: Secondary | ICD-10-CM | POA: Diagnosis not present

## 2017-07-03 DIAGNOSIS — M79674 Pain in right toe(s): Secondary | ICD-10-CM

## 2017-07-03 DIAGNOSIS — B351 Tinea unguium: Secondary | ICD-10-CM | POA: Diagnosis not present

## 2017-07-03 DIAGNOSIS — M79675 Pain in left toe(s): Secondary | ICD-10-CM | POA: Diagnosis not present

## 2017-07-03 NOTE — Progress Notes (Signed)
Patient ID: [name] [name], female   DOB: 1928/03/30, 84 y.o.   MRN: [id number]  Subjective: 84 y.o. returns the office today for painful, elongated, thickened toenails which they cannot trim themselves as well as for painful calluses to her feet.  She states that the toenails and calluses are with pressure in shoes.  No redness or drainage or swelling to the nail or callus sites.  No other concerns today.Denies any systemic complaints such as fevers, chills, nausea, vomiting.   Objective: AAO 3, NAD DP/PT pulses palpable, CRT less than 3 seconds Nails hypertrophic, dystrophic, elongated, brittle, discolored 10. There is tenderness overlying the nails 1-5 bilaterally. There is no surrounding erythema or drainage along the nail sites. Hyperkeratotic lesion to the plantar aspect of the right foot submetatarsal 1 and along the distal 3rd toes bilaterally and right 2nd toe. There is dried blood under the calluses but there is no open lesions. Upon debridement there is no underlying ulceration, drainage or any signs of infection. No open lesions or pre-ulcerative lesions are identified. No other areas of tenderness bilateral lower extremities. No overlying edema, erythema, increased warmth. No pain with calf compression, swelling, warmth, erythema.  Assessment: Patient presents with symptomatic onychomycosis; hyperkeratotic lesions  Plan: -Treatment options including alternatives, risks, complications were discussed -Nails sharply debrided 10 without complication/bleeding. -Hyperkeratotic lesion sharply debrided 4 without complications or bleeding. -Discussed daily foot inspection. If there are any changes, to call the office immediately.  -Follow-up in 9 weeks or sooner if any problems are to arise. In the meantime, encouraged to call the office with any questions, concerns, changes symptoms.  Celesta Gentile, DPM

## 2017-08-07 DIAGNOSIS — R7301 Impaired fasting glucose: Secondary | ICD-10-CM | POA: Diagnosis not present

## 2017-08-07 DIAGNOSIS — F0390 Unspecified dementia without behavioral disturbance: Secondary | ICD-10-CM | POA: Diagnosis not present

## 2017-08-07 DIAGNOSIS — E782 Mixed hyperlipidemia: Secondary | ICD-10-CM | POA: Diagnosis not present

## 2017-08-07 DIAGNOSIS — I10 Essential (primary) hypertension: Secondary | ICD-10-CM | POA: Diagnosis not present

## 2017-09-04 DIAGNOSIS — F0390 Unspecified dementia without behavioral disturbance: Secondary | ICD-10-CM | POA: Diagnosis not present

## 2017-09-10 ENCOUNTER — Encounter: Payer: Self-pay | Admitting: Podiatry

## 2017-09-10 ENCOUNTER — Ambulatory Visit (INDEPENDENT_AMBULATORY_CARE_PROVIDER_SITE_OTHER): Payer: PPO | Admitting: Podiatry

## 2017-09-10 DIAGNOSIS — M79674 Pain in right toe(s): Secondary | ICD-10-CM | POA: Diagnosis not present

## 2017-09-10 DIAGNOSIS — Q828 Other specified congenital malformations of skin: Secondary | ICD-10-CM | POA: Diagnosis not present

## 2017-09-10 DIAGNOSIS — B351 Tinea unguium: Secondary | ICD-10-CM

## 2017-09-10 DIAGNOSIS — M79675 Pain in left toe(s): Secondary | ICD-10-CM | POA: Diagnosis not present

## 2017-09-11 NOTE — Progress Notes (Signed)
Subjective: 84 y.o. returns the office today for painful, elongated, thickened toenails which they cannot trim herself as well as for painful calluses to her feet.  Denies any redness or drainage or swelling to the nail or callus sites.  No other concerns today.Denies any systemic complaints such as fevers, chills, nausea, vomiting.   Objective: AAO 3, NAD DP/PT pulses palpable, CRT less than 3 seconds Nails hypertrophic, dystrophic, elongated, brittle, discolored 10. There is tenderness overlying the nails 1-5 bilaterally. There is no surrounding erythema or drainage along the nail sites. Hyperkeratotic lesion to the plantar aspect of the right foot submetatarsal 1 and along the distal 3rd toes bilaterally and right 2nd toe. There is still dried blood under the calluses but there is no open lesions.  No open lesions or pre-ulcerative lesions are identified. No other areas of tenderness bilateral lower extremities. No overlying edema, erythema, increased warmth. No pain with calf compression, swelling, warmth, erythema.  Assessment: Patient presents with symptomatic onychomycosis; hyperkeratotic lesions  Plan: -Treatment options including alternatives, risks, complications were discussed -Nails sharply debrided 10 without complication/bleeding. -Hyperkeratotic lesion sharply debrided 4 without complications or bleeding. -Discussed daily foot inspection. If there are any changes, to call the office immediately.  -Follow-up in 9 weeks or sooner if any problems are to arise. In the meantime, encouraged to call the office with any questions, concerns, changes symptoms.  Celesta Gentile, DPM

## 2017-09-24 DIAGNOSIS — M150 Primary generalized (osteo)arthritis: Secondary | ICD-10-CM | POA: Diagnosis not present

## 2017-09-24 DIAGNOSIS — M0589 Other rheumatoid arthritis with rheumatoid factor of multiple sites: Secondary | ICD-10-CM | POA: Diagnosis not present

## 2017-09-26 ENCOUNTER — Other Ambulatory Visit: Payer: Self-pay | Admitting: Internal Medicine

## 2017-09-26 DIAGNOSIS — R928 Other abnormal and inconclusive findings on diagnostic imaging of breast: Secondary | ICD-10-CM

## 2017-10-03 ENCOUNTER — Ambulatory Visit: Payer: PPO | Admitting: Podiatry

## 2017-10-28 ENCOUNTER — Other Ambulatory Visit: Payer: Self-pay | Admitting: Internal Medicine

## 2017-10-28 ENCOUNTER — Ambulatory Visit
Admission: RE | Admit: 2017-10-28 | Discharge: 2017-10-28 | Disposition: A | Discharge status: Home / Self Care | Payer: PPO | Source: Ambulatory Visit | Attending: Internal Medicine | Admitting: Internal Medicine

## 2017-10-28 DIAGNOSIS — R921 Mammographic calcification found on diagnostic imaging of breast: Secondary | ICD-10-CM

## 2017-10-28 DIAGNOSIS — R928 Other abnormal and inconclusive findings on diagnostic imaging of breast: Secondary | ICD-10-CM

## 2017-11-19 ENCOUNTER — Ambulatory Visit (INDEPENDENT_AMBULATORY_CARE_PROVIDER_SITE_OTHER): Payer: PPO | Admitting: Podiatry

## 2017-11-19 ENCOUNTER — Encounter: Payer: Self-pay | Admitting: Podiatry

## 2017-11-19 DIAGNOSIS — M79675 Pain in left toe(s): Secondary | ICD-10-CM

## 2017-11-19 DIAGNOSIS — M79674 Pain in right toe(s): Secondary | ICD-10-CM

## 2017-11-19 DIAGNOSIS — Q828 Other specified congenital malformations of skin: Secondary | ICD-10-CM

## 2017-11-19 DIAGNOSIS — B351 Tinea unguium: Secondary | ICD-10-CM

## 2017-11-19 NOTE — Progress Notes (Signed)
Subjective: 84 y.o. returns the office today for painful, elongated, thickened toenails which they cannot trim herself as well as for painful calluses to her feet.  Denies any redness or drainage or swelling to the nail or callus sites.  No other concerns today.Denies any systemic complaints such as fevers, chills, nausea, vomiting.   Objective: AAO 3, NAD DP/PT pulses palpable, CRT less than 3 seconds Nails hypertrophic, dystrophic, elongated, brittle, discolored 10. There is tenderness overlying the nails 1-5 bilaterally. There is no surrounding erythema or drainage along the nail sites. Hyperkeratotic lesion to the plantar aspect of the right foot submetatarsal 1 and along the distal 3rd toes bilaterally. There is still dried blood under the calluses but there is no open lesions.  No open lesions or pre-ulcerative lesions are identified. No pain with calf compression, swelling, warmth, erythema.  Assessment: Patient presents with symptomatic onychomycosis; hyperkeratotic lesions  Plan: -Treatment options including alternatives, risks, complications were discussed -Nails sharply debrided 10 without complication/bleeding. -Hyperkeratotic lesion sharply debrided 3 without complications or bleeding. -Discussed daily foot inspection. If there are any changes, to call the office immediately.  -Follow-up in 9 weeks or sooner if any problems are to arise. In the meantime, encouraged to call the office with any questions, concerns, changes symptoms.  Celesta Gentile, DPM

## 2017-12-02 DIAGNOSIS — Z0000 Encounter for general adult medical examination without abnormal findings: Secondary | ICD-10-CM | POA: Diagnosis not present

## 2017-12-02 DIAGNOSIS — F0390 Unspecified dementia without behavioral disturbance: Secondary | ICD-10-CM | POA: Diagnosis not present

## 2017-12-02 DIAGNOSIS — I10 Essential (primary) hypertension: Secondary | ICD-10-CM | POA: Diagnosis not present

## 2017-12-02 DIAGNOSIS — E782 Mixed hyperlipidemia: Secondary | ICD-10-CM | POA: Diagnosis not present

## 2017-12-02 DIAGNOSIS — Z780 Asymptomatic menopausal state: Secondary | ICD-10-CM | POA: Diagnosis not present

## 2017-12-02 DIAGNOSIS — Z23 Encounter for immunization: Secondary | ICD-10-CM | POA: Diagnosis not present

## 2017-12-02 DIAGNOSIS — R7301 Impaired fasting glucose: Secondary | ICD-10-CM | POA: Diagnosis not present

## 2017-12-09 DIAGNOSIS — F423 Hoarding disorder: Secondary | ICD-10-CM | POA: Diagnosis not present

## 2017-12-09 DIAGNOSIS — F0390 Unspecified dementia without behavioral disturbance: Secondary | ICD-10-CM | POA: Diagnosis not present

## 2017-12-09 DIAGNOSIS — I10 Essential (primary) hypertension: Secondary | ICD-10-CM | POA: Diagnosis not present

## 2017-12-09 DIAGNOSIS — I350 Nonrheumatic aortic (valve) stenosis: Secondary | ICD-10-CM | POA: Diagnosis not present

## 2017-12-09 DIAGNOSIS — E782 Mixed hyperlipidemia: Secondary | ICD-10-CM | POA: Diagnosis not present

## 2017-12-09 DIAGNOSIS — M059 Rheumatoid arthritis with rheumatoid factor, unspecified: Secondary | ICD-10-CM | POA: Diagnosis not present

## 2017-12-09 DIAGNOSIS — E039 Hypothyroidism, unspecified: Secondary | ICD-10-CM | POA: Diagnosis not present

## 2017-12-09 DIAGNOSIS — Z0000 Encounter for general adult medical examination without abnormal findings: Secondary | ICD-10-CM | POA: Diagnosis not present

## 2017-12-09 DIAGNOSIS — R7301 Impaired fasting glucose: Secondary | ICD-10-CM | POA: Diagnosis not present

## 2017-12-12 DIAGNOSIS — I350 Nonrheumatic aortic (valve) stenosis: Secondary | ICD-10-CM | POA: Diagnosis not present

## 2018-01-21 ENCOUNTER — Other Ambulatory Visit: Payer: Self-pay | Admitting: Podiatry

## 2018-01-21 ENCOUNTER — Ambulatory Visit (INDEPENDENT_AMBULATORY_CARE_PROVIDER_SITE_OTHER): Payer: PPO

## 2018-01-21 ENCOUNTER — Ambulatory Visit (INDEPENDENT_AMBULATORY_CARE_PROVIDER_SITE_OTHER): Payer: PPO | Admitting: Podiatry

## 2018-01-21 DIAGNOSIS — M779 Enthesopathy, unspecified: Secondary | ICD-10-CM | POA: Diagnosis not present

## 2018-01-21 DIAGNOSIS — M19071 Primary osteoarthritis, right ankle and foot: Secondary | ICD-10-CM

## 2018-01-21 DIAGNOSIS — M79674 Pain in right toe(s): Secondary | ICD-10-CM

## 2018-01-21 DIAGNOSIS — M216X1 Other acquired deformities of right foot: Secondary | ICD-10-CM

## 2018-01-21 DIAGNOSIS — Q828 Other specified congenital malformations of skin: Secondary | ICD-10-CM | POA: Diagnosis not present

## 2018-01-21 DIAGNOSIS — B351 Tinea unguium: Secondary | ICD-10-CM | POA: Diagnosis not present

## 2018-01-21 DIAGNOSIS — M778 Other enthesopathies, not elsewhere classified: Secondary | ICD-10-CM

## 2018-01-21 DIAGNOSIS — M79675 Pain in left toe(s): Secondary | ICD-10-CM

## 2018-01-21 DIAGNOSIS — M79671 Pain in right foot: Secondary | ICD-10-CM

## 2018-01-28 NOTE — Progress Notes (Signed)
Subjective: 84 y.o. returns the office today for painful, elongated, thickened toenails which they cannot trim herself as well as for painful calluses to her feet.  Denies any redness or drainage or swelling to the nail or callus sites.  He states the callus in the right foot is very tender.  She is asked if there is any surgery that can be performed to help limit some of the pain.  This is been ongoing issue for her.  No other concerns today.Denies any systemic complaints such as fevers, chills, nausea, vomiting.   Objective: AAO 3, NAD DP/PT pulses palpable, CRT less than 3 seconds Nails hypertrophic, dystrophic, elongated, brittle, discolored 10. There is tenderness overlying the nails 1-5 bilaterally. There is no surrounding erythema or drainage along the nail sites. Hyperkeratotic lesion to the plantar aspect of the right foot submetatarsal 1 and along the distal 3rd toes bilaterally. There is still dried blood under the calluses but there is no open lesions.  No open lesions or pre-ulcerative lesions are identified. No pain with calf compression, swelling, warmth, erythema.  Assessment: Patient presents with symptomatic onychomycosis; hyperkeratotic lesions; plantarflexed metatarsal  Plan: -Treatment options including alternatives, risks, complications were discussed -Nails sharply debrided 10 without complication/bleeding. -Hyperkeratotic lesion sharply debrided 3 without complications or bleeding. -X-rays were obtained and reviewed of the right foot appears to be arthritic changes present of the right first MPJ.  No evidence of acute fracture.  Midfoot arthritis present.  We discussed various treatment options.  Although she gets relief with periodic debridement is not lasting as long as it was and she is asking about surgical intervention.  Discussed the possible plantar exostectomy however given her age hesitant to put her through surgery.  She would like to least proceed with it.  I  will talk to her primary care physician regards to clearance for surgery. -Discussed daily foot inspection. If there are any changes, to call the office immediately.  -Follow-up in 9 weeks or sooner if any problems are to arise. In the meantime, encouraged to call the office with any questions, concerns, changes symptoms.  Celesta Gentile, DPM

## 2018-03-06 ENCOUNTER — Encounter: Payer: Self-pay | Admitting: *Deleted

## 2018-03-06 NOTE — Progress Notes (Signed)
Per Dr. Jacqualyn Posey, I sent a medical clearance request letter to Dr. Ashby Dawes regarding Ms. Azpeitia.

## 2018-03-11 ENCOUNTER — Telehealth: Payer: Self-pay | Admitting: *Deleted

## 2018-03-11 NOTE — Telephone Encounter (Signed)
"  My name is Francis Yardley.  I'm calling in reference to my mother, [name] [name].  I think she's scheduled to have surgery.  Please give me a call."

## 2018-03-12 DIAGNOSIS — Z01818 Encounter for other preprocedural examination: Secondary | ICD-10-CM | POA: Diagnosis not present

## 2018-03-12 NOTE — Telephone Encounter (Signed)
"  Thanks for calling me back.  My mother is saying that she is having surgery for her foot.  I don't know when she is scheduled.  Where it's going to be.  I don't know what she's having."  We don't have her scheduled at this time.  Dr. Jacqualyn Posey wanted medical clearance before scheduling her due to her age.  The procedure is a Metatarsal Head Resection due to Arthritis.  He's going to remove the bone spur that has developed.  Once we get clearance, she's going to need a consultation appointment with Dr. Jacqualyn Posey.  You will probably need to come in with her to that appointment because he will go over the surgery in detail.  "Okay, that sounds good because mama was telling me that no one would have to go with her and everything.  She has a little touch of dementia."

## 2018-03-12 NOTE — Telephone Encounter (Signed)
I attempted to return her call.  I left her a message to return my call.   I need to let her know that we are still waiting on medical clearance from Dr. Ashby Dawes.  [name] [name] is not scheduled for surgery at this time.

## 2018-03-18 DIAGNOSIS — Z01818 Encounter for other preprocedural examination: Secondary | ICD-10-CM | POA: Diagnosis not present

## 2018-03-18 DIAGNOSIS — M059 Rheumatoid arthritis with rheumatoid factor, unspecified: Secondary | ICD-10-CM | POA: Diagnosis not present

## 2018-03-18 DIAGNOSIS — I10 Essential (primary) hypertension: Secondary | ICD-10-CM | POA: Diagnosis not present

## 2018-03-20 NOTE — Telephone Encounter (Signed)
I left a message for [name] [name] to call and schedule her mother an appointment with Dr. Jacqualyn Posey for a consultation.  We received medical clearance from her primary care physician.  "I just received a call from you."  Yes, I left you a message that we got clearance from he primary care physician.  So, your mother needs an appointment for a consultation with Dr. Jacqualyn Posey.  Would you like me to transfer you to a scheduler?  "Yes, I would please."  I transferred her to [place].

## 2018-03-26 DIAGNOSIS — M150 Primary generalized (osteo)arthritis: Secondary | ICD-10-CM | POA: Diagnosis not present

## 2018-03-26 DIAGNOSIS — F0390 Unspecified dementia without behavioral disturbance: Secondary | ICD-10-CM | POA: Diagnosis not present

## 2018-03-26 DIAGNOSIS — I10 Essential (primary) hypertension: Secondary | ICD-10-CM | POA: Diagnosis not present

## 2018-03-26 DIAGNOSIS — M0589 Other rheumatoid arthritis with rheumatoid factor of multiple sites: Secondary | ICD-10-CM | POA: Diagnosis not present

## 2018-03-26 DIAGNOSIS — E785 Hyperlipidemia, unspecified: Secondary | ICD-10-CM | POA: Diagnosis not present

## 2018-04-01 ENCOUNTER — Encounter: Payer: Self-pay | Admitting: Podiatry

## 2018-04-01 ENCOUNTER — Ambulatory Visit (INDEPENDENT_AMBULATORY_CARE_PROVIDER_SITE_OTHER): Payer: PPO | Admitting: Podiatry

## 2018-04-01 DIAGNOSIS — M79675 Pain in left toe(s): Secondary | ICD-10-CM | POA: Diagnosis not present

## 2018-04-01 DIAGNOSIS — B351 Tinea unguium: Secondary | ICD-10-CM | POA: Diagnosis not present

## 2018-04-01 DIAGNOSIS — M79674 Pain in right toe(s): Secondary | ICD-10-CM

## 2018-04-01 DIAGNOSIS — M216X1 Other acquired deformities of right foot: Secondary | ICD-10-CM | POA: Diagnosis not present

## 2018-04-01 DIAGNOSIS — Q828 Other specified congenital malformations of skin: Secondary | ICD-10-CM

## 2018-04-03 NOTE — Progress Notes (Signed)
Subjective: 85 y.o. returns the office today for painful, elongated, thickened toenails which she cannot trim herself as well as for continuation of a painful callus on the ball of the right foot.  She presents today with her daughter to discuss possible surgical intervention.  Her daughter wants to discuss other options besides surgery.. Denies any redness or drainage around the nails. Denies any acute changes since last appointment and no new complaints today. Denies any systemic complaints such as fevers, chills, nausea, vomiting.   PCP: Chryl Heck, Rama Merrilee Seashore), MD (Inactive)    Objective: AAO 3, NAD DP/PT pulses palpable, CRT less than 3 seconds Nails hypertrophic, dystrophic, elongated, brittle, discolored 10. There is tenderness overlying the nails 1-5 bilaterally. There is no surrounding erythema or drainage along the nail sites. There is a hyperkeratotic lesion right foot submetatarsal 1.  Upon debridement there is no underlying ulceration drainage or any signs of infection.  Prominence of metatarsal head plantarly. No open lesions or pre-ulcerative lesions are identified. No other areas of tenderness bilateral lower extremities. No overlying edema, erythema, increased warmth. No pain with calf compression, swelling, warmth, erythema.  Assessment: Patient presents with symptomatic onychomycosis; symptomatic hyperkeratotic lesion  Plan: -Treatment options including alternatives, risks, complications were discussed -Nails sharply debrided 10 without complication/bleeding.  This was done as a courtesy today for her. -Hyperkeratotic lesion was sharply debrided x1 without any complications or bleeding.  We discussed multiple treatment options both conservative as well as surgical options.  Would hold off any surgery at this point we will continue with conservative care.  We will try to get her an orthotic made to help offload the area.  I will have her follow-up with Liliane Channel for  this and I will put in prior authorization through her insurance. -Discussed daily foot inspection. If there are any changes, to call the office immediately.   Celesta Gentile, DPM

## 2018-04-08 ENCOUNTER — Ambulatory Visit: Payer: PPO | Admitting: Orthotics

## 2018-04-08 DIAGNOSIS — M216X1 Other acquired deformities of right foot: Secondary | ICD-10-CM

## 2018-04-08 DIAGNOSIS — M216X2 Other acquired deformities of left foot: Secondary | ICD-10-CM | POA: Diagnosis not present

## 2018-04-08 NOTE — Progress Notes (Signed)
Patient came into today to be cast for Custom Foot Orthotics. Upon recommendation of Dr. Jacqualyn Posey Patient presents with 1st MPJ sub keratoma Goals are forefoot offload of painful area Plan vendor [place]

## 2018-05-01 ENCOUNTER — Other Ambulatory Visit: Payer: Self-pay

## 2018-05-01 ENCOUNTER — Ambulatory Visit: Payer: PPO | Admitting: Orthotics

## 2018-05-01 DIAGNOSIS — M79674 Pain in right toe(s): Secondary | ICD-10-CM

## 2018-05-01 DIAGNOSIS — Q828 Other specified congenital malformations of skin: Secondary | ICD-10-CM

## 2018-05-01 DIAGNOSIS — M216X1 Other acquired deformities of right foot: Secondary | ICD-10-CM

## 2018-05-01 DIAGNOSIS — M79675 Pain in left toe(s): Secondary | ICD-10-CM

## 2018-05-01 DIAGNOSIS — B351 Tinea unguium: Secondary | ICD-10-CM

## 2018-05-01 NOTE — Progress Notes (Signed)
Patient came in today to pick up custom made foot orthotics.  The goals were accomplished and the patient reported no dissatisfaction with said orthotics.  Patient was advised of breakin period and how to report any issues. 

## 2018-05-05 ENCOUNTER — Ambulatory Visit: Payer: PPO

## 2018-05-16 ENCOUNTER — Ambulatory Visit
Admission: RE | Admit: 2018-05-16 | Discharge: 2018-05-16 | Disposition: A | Discharge status: Home / Self Care | Payer: PPO | Source: Ambulatory Visit | Attending: Internal Medicine | Admitting: Internal Medicine

## 2018-05-16 ENCOUNTER — Other Ambulatory Visit: Payer: Self-pay

## 2018-05-16 DIAGNOSIS — R921 Mammographic calcification found on diagnostic imaging of breast: Secondary | ICD-10-CM

## 2018-06-23 DIAGNOSIS — R7301 Impaired fasting glucose: Secondary | ICD-10-CM | POA: Diagnosis not present

## 2018-06-23 DIAGNOSIS — E782 Mixed hyperlipidemia: Secondary | ICD-10-CM | POA: Diagnosis not present

## 2018-06-23 DIAGNOSIS — I10 Essential (primary) hypertension: Secondary | ICD-10-CM | POA: Diagnosis not present

## 2018-06-30 DIAGNOSIS — E782 Mixed hyperlipidemia: Secondary | ICD-10-CM | POA: Diagnosis not present

## 2018-06-30 DIAGNOSIS — R7301 Impaired fasting glucose: Secondary | ICD-10-CM | POA: Diagnosis not present

## 2018-06-30 DIAGNOSIS — Z7189 Other specified counseling: Secondary | ICD-10-CM | POA: Diagnosis not present

## 2018-06-30 DIAGNOSIS — I10 Essential (primary) hypertension: Secondary | ICD-10-CM | POA: Diagnosis not present

## 2018-08-26 ENCOUNTER — Inpatient Hospital Stay (HOSPITAL_COMMUNITY)
Admission: EM | Admit: 2018-08-26 | Discharge: 2018-08-28 | DRG: 558 | Disposition: A | Discharge status: Home Health | Payer: PPO | Attending: Internal Medicine | Admitting: Internal Medicine

## 2018-08-26 ENCOUNTER — Emergency Department (HOSPITAL_COMMUNITY): Payer: PPO

## 2018-08-26 ENCOUNTER — Other Ambulatory Visit: Payer: Self-pay

## 2018-08-26 ENCOUNTER — Encounter (HOSPITAL_COMMUNITY): Payer: Self-pay | Admitting: Emergency Medicine

## 2018-08-26 DIAGNOSIS — M6282 Rhabdomyolysis: Secondary | ICD-10-CM | POA: Diagnosis not present

## 2018-08-26 DIAGNOSIS — E876 Hypokalemia: Secondary | ICD-10-CM | POA: Diagnosis not present

## 2018-08-26 DIAGNOSIS — N19 Unspecified kidney failure: Secondary | ICD-10-CM | POA: Diagnosis not present

## 2018-08-26 DIAGNOSIS — N289 Disorder of kidney and ureter, unspecified: Secondary | ICD-10-CM

## 2018-08-26 DIAGNOSIS — I350 Nonrheumatic aortic (valve) stenosis: Secondary | ICD-10-CM | POA: Diagnosis not present

## 2018-08-26 DIAGNOSIS — Z885 Allergy status to narcotic agent status: Secondary | ICD-10-CM | POA: Diagnosis not present

## 2018-08-26 DIAGNOSIS — I493 Ventricular premature depolarization: Secondary | ICD-10-CM | POA: Diagnosis not present

## 2018-08-26 DIAGNOSIS — S3993XA Unspecified injury of pelvis, initial encounter: Secondary | ICD-10-CM | POA: Diagnosis not present

## 2018-08-26 DIAGNOSIS — S299XXA Unspecified injury of thorax, initial encounter: Secondary | ICD-10-CM | POA: Diagnosis not present

## 2018-08-26 DIAGNOSIS — F0390 Unspecified dementia without behavioral disturbance: Secondary | ICD-10-CM | POA: Diagnosis not present

## 2018-08-26 DIAGNOSIS — I480 Paroxysmal atrial fibrillation: Secondary | ICD-10-CM | POA: Diagnosis not present

## 2018-08-26 DIAGNOSIS — Z79899 Other long term (current) drug therapy: Secondary | ICD-10-CM | POA: Diagnosis not present

## 2018-08-26 DIAGNOSIS — T796XXD Traumatic ischemia of muscle, subsequent encounter: Secondary | ICD-10-CM | POA: Diagnosis not present

## 2018-08-26 DIAGNOSIS — M069 Rheumatoid arthritis, unspecified: Secondary | ICD-10-CM | POA: Diagnosis present

## 2018-08-26 DIAGNOSIS — S7011XA Contusion of right thigh, initial encounter: Secondary | ICD-10-CM | POA: Diagnosis present

## 2018-08-26 DIAGNOSIS — D649 Anemia, unspecified: Secondary | ICD-10-CM | POA: Diagnosis not present

## 2018-08-26 DIAGNOSIS — R0902 Hypoxemia: Secondary | ICD-10-CM | POA: Diagnosis not present

## 2018-08-26 DIAGNOSIS — E870 Hyperosmolality and hypernatremia: Secondary | ICD-10-CM

## 2018-08-26 DIAGNOSIS — R239 Unspecified skin changes: Secondary | ICD-10-CM | POA: Diagnosis not present

## 2018-08-26 DIAGNOSIS — W1811XA Fall from or off toilet without subsequent striking against object, initial encounter: Secondary | ICD-10-CM | POA: Diagnosis present

## 2018-08-26 DIAGNOSIS — R748 Abnormal levels of other serum enzymes: Secondary | ICD-10-CM

## 2018-08-26 DIAGNOSIS — E785 Hyperlipidemia, unspecified: Secondary | ICD-10-CM | POA: Diagnosis present

## 2018-08-26 DIAGNOSIS — I4891 Unspecified atrial fibrillation: Secondary | ICD-10-CM | POA: Diagnosis not present

## 2018-08-26 DIAGNOSIS — R079 Chest pain, unspecified: Secondary | ICD-10-CM | POA: Diagnosis not present

## 2018-08-26 DIAGNOSIS — R2689 Other abnormalities of gait and mobility: Secondary | ICD-10-CM | POA: Diagnosis not present

## 2018-08-26 DIAGNOSIS — I491 Atrial premature depolarization: Secondary | ICD-10-CM | POA: Diagnosis present

## 2018-08-26 DIAGNOSIS — R55 Syncope and collapse: Secondary | ICD-10-CM | POA: Diagnosis not present

## 2018-08-26 DIAGNOSIS — I119 Hypertensive heart disease without heart failure: Secondary | ICD-10-CM | POA: Diagnosis present

## 2018-08-26 DIAGNOSIS — R102 Pelvic and perineal pain: Secondary | ICD-10-CM | POA: Diagnosis not present

## 2018-08-26 DIAGNOSIS — Z7982 Long term (current) use of aspirin: Secondary | ICD-10-CM | POA: Diagnosis not present

## 2018-08-26 DIAGNOSIS — Z03818 Encounter for observation for suspected exposure to other biological agents ruled out: Secondary | ICD-10-CM | POA: Diagnosis not present

## 2018-08-26 DIAGNOSIS — Z1159 Encounter for screening for other viral diseases: Secondary | ICD-10-CM | POA: Diagnosis not present

## 2018-08-26 DIAGNOSIS — L8990 Pressure ulcer of unspecified site, unspecified stage: Secondary | ICD-10-CM | POA: Diagnosis present

## 2018-08-26 DIAGNOSIS — R262 Difficulty in walking, not elsewhere classified: Secondary | ICD-10-CM | POA: Diagnosis not present

## 2018-08-26 HISTORY — DX: Disorder of kidney and ureter, unspecified: N28.9

## 2018-08-26 HISTORY — DX: Rhabdomyolysis: M62.82

## 2018-08-26 HISTORY — DX: Anemia, unspecified: D64.9

## 2018-08-26 LAB — COMPREHENSIVE METABOLIC PANEL
ALT: 91 U/L — ABNORMAL HIGH (ref 0–44)
AST: 157 U/L — ABNORMAL HIGH (ref 15–41)
Albumin: 3.3 g/dL — ABNORMAL LOW (ref 3.5–5.0)
Alkaline Phosphatase: 41 U/L (ref 38–126)
Anion gap: 14 (ref 5–15)
BUN: 50 mg/dL — ABNORMAL HIGH (ref 8–23)
CO2: 23 mmol/L (ref 22–32)
Calcium: 9.9 mg/dL (ref 8.9–10.3)
Chloride: 115 mmol/L — ABNORMAL HIGH (ref 98–111)
Creatinine, Ser: 1.14 mg/dL — ABNORMAL HIGH (ref 0.44–1.00)
GFR calc Af Amer: 50 mL/min — ABNORMAL LOW (ref >60)
GFR calc non Af Amer: 43 mL/min — ABNORMAL LOW (ref >60)
Glucose, Bld: 98 mg/dL (ref 70–99)
Potassium: 3.2 mmol/L — ABNORMAL LOW (ref 3.5–5.1)
Sodium: 152 mmol/L — ABNORMAL HIGH (ref 135–145)
Total Bilirubin: 1.1 mg/dL (ref 0.3–1.2)
Total Protein: 7.5 g/dL (ref 6.5–8.1)

## 2018-08-26 LAB — CBC WITH DIFFERENTIAL/PLATELET
Abs Immature Granulocytes: 0.07 10*3/uL (ref 0.00–0.07)
Basophils Absolute: 0 10*3/uL (ref 0.0–0.1)
Basophils Relative: 0 %
Eosinophils Absolute: 0 10*3/uL (ref 0.0–0.5)
Eosinophils Relative: 0 %
HCT: 36.9 % (ref 36.0–46.0)
Hemoglobin: 11.6 g/dL — ABNORMAL LOW (ref 12.0–15.0)
Immature Granulocytes: 1 %
Lymphocytes Relative: 17 %
Lymphs Abs: 1.8 10*3/uL (ref 0.7–4.0)
MCH: 30.2 pg (ref 26.0–34.0)
MCHC: 31.4 g/dL (ref 30.0–36.0)
MCV: 96.1 fL (ref 80.0–100.0)
Monocytes Absolute: 1.1 10*3/uL — ABNORMAL HIGH (ref 0.1–1.0)
Monocytes Relative: 11 %
Neutro Abs: 7.5 10*3/uL (ref 1.7–7.7)
Neutrophils Relative %: 71 %
Platelets: 342 10*3/uL (ref 150–400)
RBC: 3.84 MIL/uL — ABNORMAL LOW (ref 3.87–5.11)
RDW: 13.4 % (ref 11.5–15.5)
WBC: 10.4 10*3/uL (ref 4.0–10.5)
nRBC: 0 % (ref 0.0–0.2)

## 2018-08-26 LAB — CK: Total CK: 2014 U/L — ABNORMAL HIGH (ref 38–234)

## 2018-08-26 LAB — TROPONIN I (HIGH SENSITIVITY)
Troponin I (High Sensitivity): 95 ng/L — ABNORMAL HIGH (ref <18)
Troponin I (High Sensitivity): 103 ng/L (ref <18)

## 2018-08-26 LAB — CBG MONITORING, ED: Glucose-Capillary: 85 mg/dL (ref 70–99)

## 2018-08-26 LAB — SARS CORONAVIRUS 2 BY RT PCR (HOSPITAL ORDER, PERFORMED IN ~~LOC~~ HOSPITAL LAB): SARS Coronavirus 2: NEGATIVE

## 2018-08-26 MED ORDER — ENOXAPARIN SODIUM 40 MG/0.4ML ~~LOC~~ SOLN
40.0000 mg | SUBCUTANEOUS | Status: DC
  Administered 2018-08-26 – 2018-08-27 (×2): 40 mg via SUBCUTANEOUS
  Filled 2018-08-26: qty 0.4

## 2018-08-26 MED ORDER — DONEPEZIL HCL 10 MG PO TABS
10.0000 mg | ORAL_TABLET | Freq: Every day | ORAL | Status: DC
  Administered 2018-08-26 – 2018-08-27 (×2): 10 mg via ORAL
  Filled 2018-08-26 (×2): qty 1

## 2018-08-26 MED ORDER — SODIUM CHLORIDE 0.9% FLUSH
3.0000 mL | Freq: Two times a day (BID) | INTRAVENOUS | Status: DC
  Administered 2018-08-26 – 2018-08-28 (×3): 3 mL via INTRAVENOUS

## 2018-08-26 MED ORDER — POTASSIUM CHLORIDE CRYS ER 20 MEQ PO TBCR
40.0000 meq | EXTENDED_RELEASE_TABLET | ORAL | Status: AC
  Administered 2018-08-26: 40 meq via ORAL
  Filled 2018-08-26: qty 2

## 2018-08-26 MED ORDER — ONDANSETRON HCL 4 MG/2ML IJ SOLN: 4.0000 mg | Freq: Four times a day (QID) | INTRAMUSCULAR | Status: DC | PRN

## 2018-08-26 MED ORDER — SODIUM CHLORIDE 0.9 % IV BOLUS
1000.0000 mL | Freq: Once | INTRAVENOUS | Status: AC
  Administered 2018-08-26: 1000 mL via INTRAVENOUS

## 2018-08-26 MED ORDER — SODIUM CHLORIDE 0.9 % IV BOLUS
1000.0000 mL | Freq: Once | INTRAVENOUS | Status: AC
  Administered 2018-08-26: 16:00:00 1000 mL via INTRAVENOUS

## 2018-08-26 MED ORDER — SODIUM CHLORIDE 0.9 % IV SOLN
INTRAVENOUS | Status: DC
  Administered 2018-08-26 – 2018-08-27 (×2): via INTRAVENOUS

## 2018-08-26 MED ORDER — ASPIRIN EC 81 MG PO TBEC
81.0000 mg | DELAYED_RELEASE_TABLET | Freq: Every day | ORAL | Status: DC
  Administered 2018-08-26 – 2018-08-28 (×3): 81 mg via ORAL
  Filled 2018-08-26 (×2): qty 1

## 2018-08-26 MED ORDER — SERTRALINE HCL 100 MG PO TABS
100.0000 mg | ORAL_TABLET | Freq: Every day | ORAL | Status: DC
  Administered 2018-08-26 – 2018-08-28 (×3): 100 mg via ORAL
  Filled 2018-08-26 (×3): qty 1

## 2018-08-26 MED ORDER — ONDANSETRON HCL 4 MG PO TABS: 4.0000 mg | ORAL_TABLET | Freq: Four times a day (QID) | ORAL | Status: DC | PRN

## 2018-08-26 NOTE — ED Provider Notes (Signed)
[place] EMERGENCY DEPARTMENT Provider Note   CSN: [id number] Arrival date & time: 08/26/18  1231    History   Chief Complaint No chief complaint on file.   HPI [name] [name] is a 85 y.o. female.     HPI   Level 5 caveat due to dementia.  [name] [name] is a 85 y.o. female, with a history of HTN and dementia, presenting to the ED for evaluation following a fall.  Patient states she "slid off the commode and could not get back up."  She was able to drive herself to the bedroom, but her phone battery was dead.  She is unsure about LOC.  Patient's daughter had been out of town and was not able to reach her.  Daughter sent someone to check on the patient, they heard her say through the door that she could not get up, and EMS was called.  Newspapers outside the door were dated as far back as July 2.  They would typically be collected every day.  Patient denies fever/chills, N/V/D, chest pain, abdominal pain, neck/back pain, numbness, weakness, hip pain, complaint of injuries, urinary symptoms, or any other complaints.       Past Medical History:  Diagnosis Date   Chest pain    myoview 04/24/07-mild-mod perfusion defect with mild-mod superimposed ischemiain the mid inferior, apicla inferior, basal inferolateral and mid inferolateral regions   Dementia ([place])    Gallstone    HTN (hypertension)    Pneumonia    "just now" (01/13/2013)   Rheumatoid arthritis of hand [facility])     Patient Active Problem List   Diagnosis Date Noted   Community acquired pneumonia 01/13/2013   Sepsis ([place]) 01/13/2013   CAP (community acquired pneumonia) 01/13/2013   Hyponatremia 01/13/2013   Hypochloremia 01/13/2013   Rheumatoid arthritis ([place]) 01/13/2013   Premature atrial contractions 07/22/2012   Hyperlipidemia 07/22/2012   Carotid artery bruit 07/22/2012   Aortic stenosis 07/22/2012   Chest pain    HTN (hypertension)     Past Surgical History:  Procedure  Laterality Date   APPENDECTOMY     CHOLECYSTECTOMY  1970s   TONSILLECTOMY       OB History   No obstetric history on file.      Home Medications    Prior to Admission medications   Medication Sig Start Date End Date Taking? Authorizing Provider  aspirin EC 81 MG tablet Take 81 mg by mouth daily.   Yes [provider]  calcium-vitamin D (OSCAL WITH D) 500-200 MG-UNIT per tablet Take 1 tablet by mouth daily.   Yes [provider]  donepezil (ARICEPT) 10 MG tablet Take 10 mg by mouth at bedtime. 12/04/17  Yes [provider]  fish oil-omega-3 fatty acids 1000 MG capsule Take 1 g by mouth daily.   Yes [provider]  folic acid (FOLVITE) 1 MG tablet Take 1 mg by mouth daily.   Yes [provider]  hydrochlorothiazide (HYDRODIURIL) 25 MG tablet Take 25 mg by mouth daily.   Yes [provider]  lovastatin (MEVACOR) 40 MG tablet Take 40 mg by mouth daily. 05/01/12  Yes [provider]  sertraline (ZOLOFT) 100 MG tablet Take 100 mg by mouth daily.  12/04/17  Yes [provider]    Family History Family History  Problem Relation Age of Onset   Diabetes Father    Cancer Sister    Breast cancer Sister    Cancer Sister     Social  History Social History   Tobacco Use   Smoking status: Never Smoker   Smokeless tobacco: Never Used  Substance Use Topics   Alcohol use: No   Drug use: No     Allergies   Codeine   Review of Systems Review of Systems  Unable to perform ROS: Dementia  Constitutional: Negative for chills and fever.  Respiratory: Negative for cough and shortness of breath.   Cardiovascular: Negative for chest pain.  Gastrointestinal: Negative for abdominal pain, diarrhea, nausea and vomiting.  Musculoskeletal: Negative for back pain and neck pain.  Neurological: Negative for dizziness, weakness, light-headedness, numbness and headaches.  All other systems reviewed and are  negative.    Physical Exam Updated Vital Signs BP (!) 166/86    Pulse 62    Temp 98.2 F (36.8 C) (Rectal)    Resp 16    Ht 5\' 6"  (1.676 m)    Wt 86.2 kg    SpO2 97%    BMI 30.67 kg/m   Physical Exam Vitals signs and nursing note reviewed.  Constitutional:      General: She is not in acute distress.    Appearance: She is well-developed. She is not diaphoretic.  HENT:     Head: Normocephalic.     Mouth/Throat:     Mouth: Mucous membranes are dry.     Pharynx: Oropharynx is clear.  Eyes:     Extraocular Movements: Extraocular movements intact.     Conjunctiva/sclera: Conjunctivae normal.     Pupils: Pupils are equal, round, and reactive to light.     Comments: Bruising to the right upper eyelid.  Neck:     Musculoskeletal: Neck supple.   Cardiovascular:     Rate and Rhythm: Normal rate and regular rhythm.     Pulses: Normal pulses.          Radial pulses are 2+ on the right side and 2+ on the left side.       Posterior tibial pulses are 2+ on the right side and 2+ on the left side.     Heart sounds: Normal heart sounds.     Comments: Tactile temperature in the extremities appropriate and equal bilaterally. Pulmonary:     Effort: Pulmonary effort is normal. No respiratory distress.     Breath sounds: Normal breath sounds.  Abdominal:     Palpations: Abdomen is soft.     Tenderness: There is no abdominal tenderness. There is no guarding.  Musculoskeletal:     Right lower leg: No edema.     Left lower leg: No edema.       Legs:     Comments: Patient has a large bruise to the right lateral upper leg covering almost the entire right lateral hip and right thigh.  She has full range of motion in the right hip and knee without noted pain.  No noted tenderness, deformity, or instability.  Bruising in patches along the right upper extremity.  No tenderness, deformity, or instability noted.  Full range of motion in the right hand, wrist, elbow, and shoulder without pain.   Lymphadenopathy:     Cervical: No cervical adenopathy.  Skin:    General: Skin is warm and dry.  Neurological:     Mental Status: She is alert.     Comments: Patient knows who she is, that she is in the hospital, and that she fell.  Patient's daughter states this is pretty accurate orientation for the patient's baseline.  Sensation grossly intact to  light touch in the extremities.  Grip strengths equal bilaterally.  Strength 5/5 in all extremities. Coordination intact. Cranial nerves III-XII grossly intact. No facial droop.   Psychiatric:        Mood and Affect: Mood and affect normal.        Speech: Speech normal.        Behavior: Behavior normal.      ED Treatments / Results  Labs (all labs ordered are listed, but only abnormal results are displayed) Labs Reviewed  COMPREHENSIVE METABOLIC PANEL - Abnormal; Notable for the following components:      Result Value   Sodium 152 (*)    Potassium 3.2 (*)    Chloride 115 (*)    BUN 50 (*)    Creatinine, Ser 1.14 (*)    Albumin 3.3 (*)    AST 157 (*)    ALT 91 (*)    GFR calc non Af Amer 43 (*)    GFR calc Af Amer 50 (*)    All other components within normal limits  TROPONIN I (HIGH SENSITIVITY) - Abnormal; Notable for the following components:   Troponin I (High Sensitivity) 95 (*)    All other components within normal limits  CBC WITH DIFFERENTIAL/PLATELET - Abnormal; Notable for the following components:   RBC 3.84 (*)    Hemoglobin 11.6 (*)    Monocytes Absolute 1.1 (*)    All other components within normal limits  CK - Abnormal; Notable for the following components:   Total CK 2,014 (*)    All other components within normal limits  URINE CULTURE  SARS CORONAVIRUS 2 (HOSPITAL ORDER, [facility])  URINALYSIS, ROUTINE W REFLEX MICROSCOPIC  TROPONIN I (HIGH SENSITIVITY)  CBG MONITORING, ED   Hemoglobin  Date Value Ref Range Status  08/26/2018 11.6 (L) 12.0 - 15.0 g/dL Final  03/22/2015 11.5  (L) 12.0 - 15.0 g/dL Final  01/16/2013 10.6 (L) 12.0 - 15.0 g/dL Final  01/15/2013 10.7 (L) 12.0 - 15.0 g/dL Final    EKG EKG Interpretation  Date/Time:  Tuesday August 26 2018 12:54:49 EDT Ventricular Rate:  69 PR Interval:    QRS Duration: 96 QT Interval:  423 QTC Calculation: 454 R Axis:   -30 Text Interpretation:  Sinus rhythm Probable left atrial enlargement Left ventricular hypertrophy Anterior Q waves, possibly due to LVH Nonspecific T abnormalities, lateral leads Confirmed by Blanchie Dessert [id number]) on 08/26/2018 1:15:32 PM   Radiology Ct Head Wo Contrast  Result Date: 08/26/2018 CLINICAL DATA:  Brought in by EMS after neighbors and family had not heard from her in several days. Pt was found in floor by EMS. EXAM: CT HEAD WITHOUT CONTRAST CT CERVICAL SPINE WITHOUT CONTRAST TECHNIQUE: Multidetector CT imaging of the head and cervical spine was performed following the standard protocol without intravenous contrast. Multiplanar CT image reconstructions of the cervical spine were also generated. COMPARISON:  03/11/2003 FINDINGS: CT HEAD FINDINGS Brain: No evidence of acute infarction, hemorrhage, hydrocephalus, extra-axial collection or mass lesion/mass effect. Mild periventricular white matter hypoattenuation is noted consistent with chronic microvascular ischemic change. Vascular: No hyperdense vessel or unexpected calcification. Skull: Normal. Negative for fracture or focal lesion. Sinuses/Orbits: Globes and orbits are unremarkable. Visualized sinuses and mastoid air cells are clear. Other: None. CT CERVICAL SPINE FINDINGS Alignment: Normal. Skull base and vertebrae: No acute fracture. No primary bone lesion or focal pathologic process. Soft tissues and spinal canal: No prevertebral fluid or swelling. No visible canal hematoma. No masses.  No enlarged lymph nodes. Disc levels: Discs are well maintained in height. Minor spondylotic disc bulging at C5-C6 and C6-C7. No evidence of a disc  herniation. There are facet degenerative changes bilaterally most notable on the left at C4-C5. Upper chest: No acute findings. Other: None. IMPRESSION: HEAD CT 1. No acute intracranial abnormalities. 2. Mild chronic microvascular ischemic change. CERVICAL CT 1. No fracture or acute finding. Electronically Signed   By: Lajean Manes M.D.   On: 08/26/2018 15:23   Ct Cervical Spine Wo Contrast  Result Date: 08/26/2018 CLINICAL DATA:  Brought in by EMS after neighbors and family had not heard from her in several days. Pt was found in floor by EMS. EXAM: CT HEAD WITHOUT CONTRAST CT CERVICAL SPINE WITHOUT CONTRAST TECHNIQUE: Multidetector CT imaging of the head and cervical spine was performed following the standard protocol without intravenous contrast. Multiplanar CT image reconstructions of the cervical spine were also generated. COMPARISON:  03/11/2003 FINDINGS: CT HEAD FINDINGS Brain: No evidence of acute infarction, hemorrhage, hydrocephalus, extra-axial collection or mass lesion/mass effect. Mild periventricular white matter hypoattenuation is noted consistent with chronic microvascular ischemic change. Vascular: No hyperdense vessel or unexpected calcification. Skull: Normal. Negative for fracture or focal lesion. Sinuses/Orbits: Globes and orbits are unremarkable. Visualized sinuses and mastoid air cells are clear. Other: None. CT CERVICAL SPINE FINDINGS Alignment: Normal. Skull base and vertebrae: No acute fracture. No primary bone lesion or focal pathologic process. Soft tissues and spinal canal: No prevertebral fluid or swelling. No visible canal hematoma. No masses.  No enlarged lymph nodes. Disc levels: Discs are well maintained in height. Minor spondylotic disc bulging at C5-C6 and C6-C7. No evidence of a disc herniation. There are facet degenerative changes bilaterally most notable on the left at C4-C5. Upper chest: No acute findings. Other: None. IMPRESSION: HEAD CT 1. No acute intracranial  abnormalities. 2. Mild chronic microvascular ischemic change. CERVICAL CT 1. No fracture or acute finding. Electronically Signed   By: Lajean Manes M.D.   On: 08/26/2018 15:23   Dg Pelvis Portable  Result Date: 08/26/2018 CLINICAL DATA:  Pelvic pain after fall today.  Patient found down. EXAM: PORTABLE PELVIS 1-2 VIEWS COMPARISON:  None. FINDINGS: There is no evidence of pelvic fracture or diastasis. No pelvic bone lesions are seen. Lower lumbar degenerative disease noted. IMPRESSION: No acute abnormality. Electronically Signed   By: Inge Rise M.D.   On: 08/26/2018 13:22   Dg Chest Portable 1 View  Result Date: 08/26/2018 CLINICAL DATA:  Found down.  Pain after falling. EXAM: PORTABLE CHEST 1 VIEW COMPARISON:  Radiographs 01/13/2013. FINDINGS: 1300 hours. The heart size and mediastinal contours are stable. There is aortic atherosclerosis. The lungs are clear. There is no pleural effusion or pneumothorax. No acute osseous findings are identified. Telemetry leads overlie the chest. IMPRESSION: Stable chest.  No evidence of acute cardiopulmonary process. Electronically Signed   By: Richardean Sale M.D.   On: 08/26/2018 13:24    Procedures Procedures (including critical care time)  Medications Ordered in ED Medications  sodium chloride 0.9 % bolus 1,000 mL (has no administration in time range)  sodium chloride 0.9 % bolus 1,000 mL (0 mLs Intravenous Stopped 08/26/18 1619)     Initial Impression / Assessment and Plan / ED Course  I have reviewed the triage vital signs and the nursing notes.  Pertinent labs & imaging results that were available during my care of the patient were reviewed by me and considered in my medical decision making (see chart  for details).  Clinical Course as of Aug 26 1623  Tue Aug 26, 2018  1305 Spoke with patient's daughter and [place], Sunday Spillers "[name]" Santa Claus.  Patient lives in an independent living facility. Daughter had been out of town, but unable to reach the  patient via phone.  She sent a family friend to go check on the patient.  Friend heard the patient through the door saying she could not get up. Newspapers outside the patient's door were dated back to Thursday last week.  Does not ambulate with a walker or other assistance device.  Daughter warns that sometimes patient will minimize her symptoms out of fear of losing her independence.   [SJ]  66 Updated patient's daughter.  Also discussed the possibility that patient may be unsafe to continue living alone.   [SJ]    Clinical Course User Index [SJ] Desarai Barrack C, PA-C       Patient presents for evaluation following spending several days on the floor after a fall. Patient is nontoxic appearing, afebrile, not tachycardic, not tachypneic, not hypotensive, maintains excellent SPO2 on room air, and is in no apparent distress.  She is quite dry on exam.  Elevated CK likely due to the patient lying floor for several days.  She also has elevated BUN and creatinine, suspected to be due to dehydration.  Hyponatremia also noted. Troponin was ordered because I was unsure whether patient's account of how she ended up on the floor was actually accurate.  EKG also had possible Q waves which were not present on previous EKG in 2014.  Her troponin was elevated to 95, however, patient is having no symptoms of chest pain, shortness of breath, unexplained weakness, etc.   Findings and plan of care discussed with Blanchie Dessert, MD.   End of shift patient care handoff report given to Langston Masker, PA-C. Plan: Admit to medicine service.    Final Clinical Impressions(s) / ED Diagnoses   Final diagnoses:  Elevated CK  Hypernatremia  Uremia    ED Discharge Orders    None       Layla Maw 08/26/18 1625    Blanchie Dessert, MD 08/29/18 2216

## 2018-08-26 NOTE — ED Triage Notes (Signed)
Brought in by EMS after neighbors and family had not heard from her in several days. Pt was found in floor by EMS, alert and oriented. Pt is normally ambulatory and takes care of herself. EMS reports strong urine smell. Pt states she was getting into bed, slipped into floor and was unable to get up or get to phone. Denies pain

## 2018-08-26 NOTE — H&P (Signed)
History and Physical    [name] [name] SAY:[id number] DOB: July 31, 1928 DOA: 08/26/2018  Referring MD/NP/PA:Shawn Caryl Asp, PA-C PCP: Chryl Heck, Rama Merrilee Seashore), MD (Inactive)  Patient coming from: [place] living apartment via EMS  Chief Complaint: Unable to get up  I have personally briefly reviewed patient's old medical records in [place]   HPI: [name] [name] is a 85 y.o. female with medical history significant of hypertension, rheumatoid arthritis, premature atrial contractions, mild aortic stenosis, and dementia; who presented after sliding down from her toilet seat and being unable to get up.  Patient reports that she did not fall or have a loss of consciousness.  She was able to crawl to the other side of the apartment and get her cell phone to call for help, but reports that it had died.  Patient reports that her daughter normally comes to check on her daily, but had gone out of town recently. Yesterday, the manger of the apartment complex and the patient had stated that she was fine after someone had noted and not seen her recently.  Daughter notes that her mother is very stubborn and does not like people in her business.  Today when the daughter was unable to get a hold of her she sent someone over to the house to check on her.  The friend had went to the door and patient reported that she was unable to get to the door to open it.  Her daughter at that time called EMS.  Newspapers were reported to have been at the door since July 2.  Patient currently denies having any pain.  She reports having previous history of irregular heart rhythm.  Review of records shows patient was previously evaluated for frequent premature atrial contractions by Dr. Sallyanne Kuster in 07/2012, but was thought to be benign at that time.   ED Course: Upon admission into the emergency department patient was noted to be afebrile, blood pressure 134/89-168/65, and all other vital signs maintained.  Labs revealed WBC  10.4, hemoglobin 11.6, sodium 152 potassium 3.2, BUN 50, creatinine 1.14, AST 157, ALT 91, CK 2014, and troponin 95->103.  X-rays chest and pelvis showed no acute abnormalities.  CT scan of the head and cervical spine only noting mild chronic microvascular ischemic changes but no acute abnormality.  Patient was given 1 L normal saline IV fluids.  Review of Systems  Constitutional: Negative for chills and fever.  HENT: Negative for congestion and hearing loss.   Eyes: Negative for double vision and photophobia.  Respiratory: Positive for shortness of breath. Negative for cough.   Cardiovascular: Negative for chest pain and leg swelling.  Gastrointestinal: Negative for abdominal pain, nausea and vomiting.  Genitourinary: Negative for dysuria and hematuria.  Musculoskeletal: Positive for falls.  Neurological: Negative for loss of consciousness.    Past Medical History:  Diagnosis Date   Chest pain    myoview 04/24/07-mild-mod perfusion defect with mild-mod superimposed ischemiain the mid inferior, apicla inferior, basal inferolateral and mid inferolateral regions   Dementia ([place])    Gallstone    HTN (hypertension)    Pneumonia    "just now" (01/13/2013)   Rheumatoid arthritis of hand ([place])     Past Surgical History:  Procedure Laterality Date   APPENDECTOMY     CHOLECYSTECTOMY  1970s   TONSILLECTOMY       reports that she has never smoked. She has never used smokeless tobacco. She reports that she does not drink alcohol or use drugs.  Allergies  Allergen  Reactions   Codeine Nausea And Vomiting and Other (See Comments)    Dizziness    Family History  Problem Relation Age of Onset   Diabetes Father    Cancer Sister    Breast cancer Sister    Cancer Sister     Prior to Admission medications   Medication Sig Start Date End Date Taking? Authorizing Provider  aspirin EC 81 MG tablet Take 81 mg by mouth daily.   Yes [provider]  calcium-vitamin D  (OSCAL WITH D) 500-200 MG-UNIT per tablet Take 1 tablet by mouth daily.   Yes [provider]  donepezil (ARICEPT) 10 MG tablet Take 10 mg by mouth at bedtime. 12/04/17  Yes [provider]  fish oil-omega-3 fatty acids 1000 MG capsule Take 1 g by mouth daily.   Yes [provider]  folic acid (FOLVITE) 1 MG tablet Take 1 mg by mouth daily.   Yes [provider]  hydrochlorothiazide (HYDRODIURIL) 25 MG tablet Take 25 mg by mouth daily.   Yes [provider]  lovastatin (MEVACOR) 40 MG tablet Take 40 mg by mouth daily. 05/01/12  Yes [provider]  sertraline (ZOLOFT) 100 MG tablet Take 100 mg by mouth daily.  12/04/17  Yes [provider]    Physical Exam:  Constitutional: Elderly female in no acute distress at this time Vitals:   08/26/18 1316 08/26/18 1317 08/26/18 1400 08/26/18 1415  BP: 134/89  (!) 168/65 (!) 166/86  Pulse: 63  62   Resp: 18  (!) 22 16  Temp:      TempSrc:      SpO2: 98%  97%   Weight:  86.2 kg    Height:  5\' 6"  (1.676 m)     Eyes: PERRL, lids and conjunctivae normal ENMT: Mucous membranes are dry. Posterior pharynx clear of any exudate or lesions.  Neck: normal, supple, no masses, no thyromegaly Respiratory: clear to auscultation bilaterally, no wheezing, no crackles. Normal respiratory effort. No accessory muscle use.  Cardiovascular: Regular heart rhythm with premature beat, systolic ejection murmur present.  No extremity edema. 2+ pedal pulses. No carotid bruits.  Abdomen: no tenderness, no masses palpated. No hepatosplenomegaly. Bowel sounds positive.  Musculoskeletal: no clubbing / cyanosis. No joint deformity upper and lower extremities. Good ROM, no contractures. Normal muscle tone.  Skin: Bruising of the right buttock and right shoulder. Neurologic: CN 2-12 grossly intact. Sensation intact, DTR normal. Strength 5/5 in all 4.  Psychiatric: Normal judgment and insight. Alert and oriented x 3.  Normal mood.     Labs on Admission: I have personally reviewed following labs and imaging studies  CBC: Recent Labs  Lab 08/26/18 1415  WBC 10.4  NEUTROABS 7.5  HGB 11.6*  HCT 36.9  MCV 96.1  PLT 161   Basic Metabolic Panel: Recent Labs  Lab 08/26/18 1415  NA 152*  K 3.2*  CL 115*  CO2 23  GLUCOSE 98  BUN 50*  CREATININE 1.14*  CALCIUM 9.9   GFR: Estimated Creatinine Clearance: 37.7 mL/min (A) (by C-G formula based on SCr of 1.14 mg/dL (H)). Liver Function Tests: Recent Labs  Lab 08/26/18 1415  AST 157*  ALT 91*  ALKPHOS 41  BILITOT 1.1  PROT 7.5  ALBUMIN 3.3*   No results for input(s): LIPASE, AMYLASE in the last 168 hours. No results for input(s): AMMONIA in the last 168 hours. Coagulation Profile: No results for input(s): INR, PROTIME in the last 168 hours. Cardiac Enzymes: Recent  Labs  Lab 08/26/18 1415  CKTOTAL 2,014*   BNP (last 3 results) No results for input(s): PROBNP in the last 8760 hours. HbA1C: No results for input(s): HGBA1C in the last 72 hours. CBG: Recent Labs  Lab 08/26/18 1404  GLUCAP 85   Lipid Profile: No results for input(s): CHOL, HDL, LDLCALC, TRIG, CHOLHDL, LDLDIRECT in the last 72 hours. Thyroid Function Tests: No results for input(s): TSH, T4TOTAL, FREET4, T3FREE, THYROIDAB in the last 72 hours. Anemia Panel: No results for input(s): VITAMINB12, FOLATE, FERRITIN, TIBC, IRON, RETICCTPCT in the last 72 hours. Urine analysis:    Component Value Date/Time   COLORURINE YELLOW 01/13/2013 1712   APPEARANCEUR HAZY (A) 01/13/2013 1712   LABSPEC 1.018 01/13/2013 1712   PHURINE 6.0 01/13/2013 1712   GLUCOSEU NEGATIVE 01/13/2013 1712   HGBUR NEGATIVE 01/13/2013 1712   BILIRUBINUR NEGATIVE 01/13/2013 1712   KETONESUR NEGATIVE 01/13/2013 1712   PROTEINUR NEGATIVE 01/13/2013 1712   UROBILINOGEN 0.2 01/13/2013 1712   NITRITE NEGATIVE 01/13/2013 1712   LEUKOCYTESUR MODERATE (A) 01/13/2013 1712   Sepsis Labs: No results  found for this or any previous visit (from the past 240 hour(s)).   Radiological Exams on Admission: Ct Head Wo Contrast  Result Date: 08/26/2018 CLINICAL DATA:  Brought in by EMS after neighbors and family had not heard from her in several days. Pt was found in floor by EMS. EXAM: CT HEAD WITHOUT CONTRAST CT CERVICAL SPINE WITHOUT CONTRAST TECHNIQUE: Multidetector CT imaging of the head and cervical spine was performed following the standard protocol without intravenous contrast. Multiplanar CT image reconstructions of the cervical spine were also generated. COMPARISON:  03/11/2003 FINDINGS: CT HEAD FINDINGS Brain: No evidence of acute infarction, hemorrhage, hydrocephalus, extra-axial collection or mass lesion/mass effect. Mild periventricular white matter hypoattenuation is noted consistent with chronic microvascular ischemic change. Vascular: No hyperdense vessel or unexpected calcification. Skull: Normal. Negative for fracture or focal lesion. Sinuses/Orbits: Globes and orbits are unremarkable. Visualized sinuses and mastoid air cells are clear. Other: None. CT CERVICAL SPINE FINDINGS Alignment: Normal. Skull base and vertebrae: No acute fracture. No primary bone lesion or focal pathologic process. Soft tissues and spinal canal: No prevertebral fluid or swelling. No visible canal hematoma. No masses.  No enlarged lymph nodes. Disc levels: Discs are well maintained in height. Minor spondylotic disc bulging at C5-C6 and C6-C7. No evidence of a disc herniation. There are facet degenerative changes bilaterally most notable on the left at C4-C5. Upper chest: No acute findings. Other: None. IMPRESSION: HEAD CT 1. No acute intracranial abnormalities. 2. Mild chronic microvascular ischemic change. CERVICAL CT 1. No fracture or acute finding. Electronically Signed   By: Lajean Manes M.D.   On: 08/26/2018 15:23   Ct Cervical Spine Wo Contrast  Result Date: 08/26/2018 CLINICAL DATA:  Brought in by EMS after  neighbors and family had not heard from her in several days. Pt was found in floor by EMS. EXAM: CT HEAD WITHOUT CONTRAST CT CERVICAL SPINE WITHOUT CONTRAST TECHNIQUE: Multidetector CT imaging of the head and cervical spine was performed following the standard protocol without intravenous contrast. Multiplanar CT image reconstructions of the cervical spine were also generated. COMPARISON:  03/11/2003 FINDINGS: CT HEAD FINDINGS Brain: No evidence of acute infarction, hemorrhage, hydrocephalus, extra-axial collection or mass lesion/mass effect. Mild periventricular white matter hypoattenuation is noted consistent with chronic microvascular ischemic change. Vascular: No hyperdense vessel or unexpected calcification. Skull: Normal. Negative for fracture or focal lesion. Sinuses/Orbits: Globes and orbits are unremarkable. Visualized sinuses and  mastoid air cells are clear. Other: None. CT CERVICAL SPINE FINDINGS Alignment: Normal. Skull base and vertebrae: No acute fracture. No primary bone lesion or focal pathologic process. Soft tissues and spinal canal: No prevertebral fluid or swelling. No visible canal hematoma. No masses.  No enlarged lymph nodes. Disc levels: Discs are well maintained in height. Minor spondylotic disc bulging at C5-C6 and C6-C7. No evidence of a disc herniation. There are facet degenerative changes bilaterally most notable on the left at C4-C5. Upper chest: No acute findings. Other: None. IMPRESSION: HEAD CT 1. No acute intracranial abnormalities. 2. Mild chronic microvascular ischemic change. CERVICAL CT 1. No fracture or acute finding. Electronically Signed   By: Lajean Manes M.D.   On: 08/26/2018 15:23   Dg Pelvis Portable  Result Date: 08/26/2018 CLINICAL DATA:  Pelvic pain after fall today.  Patient found down. EXAM: PORTABLE PELVIS 1-2 VIEWS COMPARISON:  None. FINDINGS: There is no evidence of pelvic fracture or diastasis. No pelvic bone lesions are seen. Lower lumbar degenerative  disease noted. IMPRESSION: No acute abnormality. Electronically Signed   By: Inge Rise M.D.   On: 08/26/2018 13:22   Dg Chest Portable 1 View  Result Date: 08/26/2018 CLINICAL DATA:  Found down.  Pain after falling. EXAM: PORTABLE CHEST 1 VIEW COMPARISON:  Radiographs 01/13/2013. FINDINGS: 1300 hours. The heart size and mediastinal contours are stable. There is aortic atherosclerosis. The lungs are clear. There is no pleural effusion or pneumothorax. No acute osseous findings are identified. Telemetry leads overlie the chest. IMPRESSION: Stable chest.  No evidence of acute cardiopulmonary process. Electronically Signed   By: Richardean Sale M.D.   On: 08/26/2018 13:24    EKG: Independently reviewed.  Sinus rhythm at 69 bpm  Assessment/Plan Rhabdomyolysis: Acute.  Patient presents after sliding down from the toilet seat unable to get back up.  Question possible fall with bruising to the right hip and shoulder patient possibly have been on the floor since July 2.  Creatinine kinase 2010.  Patient received a total of 1 L normal saline IV fluids in the ED. -Admit to a medical telemetry bed -Strict intake and output -Check urinalysis in and out cath if needed -Continue normal saline IV fluids bolusing additional 1 L, then placed on rate 100 mL/h -Recheck CK in a.m. -Will benefit from evaluation by physical therapy once rhabdomyolysis has resolved  Hypokalemia: Acute.  Potassium 3.2 on admission.  Patient on diuretics which likely could account for this. -Give 40 mEq of potassium x1 dose -Continue to monitor and replace as needed  Premature atrial contractions, history of aortic stenosis: Patient noted to have frequent PACs.  Evaluated previously by Dr. Sallyanne Kuster cardiology in 2014.  Last echocardiogram from 2014 was reported to be similar to 2009. -Check echocardiogram in a.m. -EKG as needed -Follow-up telemetry overnight  Renal insufficiency: Patient's creatinine just mildly elevated at  1.14 on admission.  However, only available baseline creatinine noted to be 0.9 was from back in 2014. -Hold nephrotoxic agents -Recheck creatinine in a.m. -Continue IV fluids as seen above  Essential hypertension: Blood pressures currently stable. -Hold hydrochlorothiazide due to acute kidney -hydralazine IV as needed for elevated systolic blood pressure  Normocytic normochromic anemia: Chronic.  Hemoglobin 11.6 on admission which appears similar to previous. -Continue to monitor  Dementia: Patient with mild dementia. -Continue Aricept -Social work consulted for possible need to be moved from independent living  Elevated liver enzymes: Acute.  AST 157 and ALT 91.  Liver enzymes can commonly  be elevated and rhabdomyolysis with AST elevated greater than ALT. -Recheck CMP in a.m.  Rheumatoid arthritis: Patient previously used to be on methotrexate, but does not appear in her current medication list.  -Will likely need to follow-up with primary care provider to see if this medication is still prescribed as office visits not visible  Hyperlipidemia -Hold lovastatin for now given elevated liver enzymes  DVT prophylaxis: Lovenox Code Status: Full Family Communication: Discussed plan of care with the patient's daughter over the phone Disposition Plan: To be determined Consults called: none  Admission status: observation  Norval Morton MD [facility] Pager [phone]   If 7PM-7AM, please contact night-coverage [email or web address] Password [email or web address]  08/26/2018, 5:00 PM

## 2018-08-26 NOTE — ED Provider Notes (Signed)
1600 Received signout from [employer].  Briefly, patient is an 85 year old female with a past medical history of rheumatoid arthritis, hyperlipidemia, hypertension who was sitting on the floor for approximately 5 days after a fall.  No traumatic injuries.  She does have what appears to be hypovolemic hypernatremia.  CK also elevated suggestive of rhabdomyolysis.  She will be admitted to hospital medicine.  1659 Received call from Dr. Tamala Julian of [facility] will admit the patient.  I appreciate his involvement.   Albesa Seen, PA-C 08/26/18 1712    Valarie Merino, MD 08/31/18 1102

## 2018-08-27 ENCOUNTER — Inpatient Hospital Stay (HOSPITAL_COMMUNITY): Payer: PPO

## 2018-08-27 DIAGNOSIS — Z1159 Encounter for screening for other viral diseases: Secondary | ICD-10-CM | POA: Diagnosis not present

## 2018-08-27 DIAGNOSIS — I119 Hypertensive heart disease without heart failure: Secondary | ICD-10-CM | POA: Diagnosis present

## 2018-08-27 DIAGNOSIS — I350 Nonrheumatic aortic (valve) stenosis: Secondary | ICD-10-CM | POA: Diagnosis present

## 2018-08-27 DIAGNOSIS — I493 Ventricular premature depolarization: Secondary | ICD-10-CM | POA: Diagnosis present

## 2018-08-27 DIAGNOSIS — W1811XA Fall from or off toilet without subsequent striking against object, initial encounter: Secondary | ICD-10-CM | POA: Diagnosis present

## 2018-08-27 DIAGNOSIS — D649 Anemia, unspecified: Secondary | ICD-10-CM | POA: Diagnosis present

## 2018-08-27 DIAGNOSIS — L8990 Pressure ulcer of unspecified site, unspecified stage: Secondary | ICD-10-CM | POA: Diagnosis present

## 2018-08-27 DIAGNOSIS — M6282 Rhabdomyolysis: Secondary | ICD-10-CM | POA: Diagnosis present

## 2018-08-27 DIAGNOSIS — Z885 Allergy status to narcotic agent status: Secondary | ICD-10-CM | POA: Diagnosis not present

## 2018-08-27 DIAGNOSIS — I4891 Unspecified atrial fibrillation: Secondary | ICD-10-CM | POA: Diagnosis present

## 2018-08-27 DIAGNOSIS — Z7982 Long term (current) use of aspirin: Secondary | ICD-10-CM | POA: Diagnosis not present

## 2018-08-27 DIAGNOSIS — S7011XA Contusion of right thigh, initial encounter: Secondary | ICD-10-CM | POA: Diagnosis present

## 2018-08-27 DIAGNOSIS — M069 Rheumatoid arthritis, unspecified: Secondary | ICD-10-CM | POA: Diagnosis present

## 2018-08-27 DIAGNOSIS — E785 Hyperlipidemia, unspecified: Secondary | ICD-10-CM | POA: Diagnosis present

## 2018-08-27 DIAGNOSIS — T796XXD Traumatic ischemia of muscle, subsequent encounter: Secondary | ICD-10-CM | POA: Diagnosis not present

## 2018-08-27 DIAGNOSIS — F0390 Unspecified dementia without behavioral disturbance: Secondary | ICD-10-CM | POA: Diagnosis present

## 2018-08-27 DIAGNOSIS — R748 Abnormal levels of other serum enzymes: Secondary | ICD-10-CM | POA: Diagnosis present

## 2018-08-27 DIAGNOSIS — Z79899 Other long term (current) drug therapy: Secondary | ICD-10-CM | POA: Diagnosis not present

## 2018-08-27 DIAGNOSIS — E870 Hyperosmolality and hypernatremia: Secondary | ICD-10-CM | POA: Diagnosis present

## 2018-08-27 DIAGNOSIS — E876 Hypokalemia: Secondary | ICD-10-CM | POA: Diagnosis present

## 2018-08-27 DIAGNOSIS — I491 Atrial premature depolarization: Secondary | ICD-10-CM | POA: Diagnosis present

## 2018-08-27 DIAGNOSIS — I480 Paroxysmal atrial fibrillation: Secondary | ICD-10-CM | POA: Diagnosis not present

## 2018-08-27 LAB — ECHOCARDIOGRAM COMPLETE
Height: 66 in
Weight: 3040 oz

## 2018-08-27 LAB — URINALYSIS, ROUTINE W REFLEX MICROSCOPIC
Bilirubin Urine: NEGATIVE
Glucose, UA: NEGATIVE mg/dL
Ketones, ur: 20 mg/dL — AB
Leukocytes,Ua: NEGATIVE
Nitrite: NEGATIVE
Protein, ur: 30 mg/dL — AB
Specific Gravity, Urine: 1.024 (ref 1.005–1.030)
pH: 6 (ref 5.0–8.0)

## 2018-08-27 LAB — COMPREHENSIVE METABOLIC PANEL
ALT: 64 U/L — ABNORMAL HIGH (ref 0–44)
AST: 99 U/L — ABNORMAL HIGH (ref 15–41)
Albumin: 2.4 g/dL — ABNORMAL LOW (ref 3.5–5.0)
Alkaline Phosphatase: 34 U/L — ABNORMAL LOW (ref 38–126)
Anion gap: 9 (ref 5–15)
BUN: 41 mg/dL — ABNORMAL HIGH (ref 8–23)
CO2: 22 mmol/L (ref 22–32)
Calcium: 8.3 mg/dL — ABNORMAL LOW (ref 8.9–10.3)
Chloride: 111 mmol/L (ref 98–111)
Creatinine, Ser: 0.98 mg/dL (ref 0.44–1.00)
GFR calc Af Amer: 60 mL/min — ABNORMAL LOW (ref >60)
GFR calc non Af Amer: 52 mL/min — ABNORMAL LOW (ref >60)
Glucose, Bld: 137 mg/dL — ABNORMAL HIGH (ref 70–99)
Potassium: 3.1 mmol/L — ABNORMAL LOW (ref 3.5–5.1)
Sodium: 142 mmol/L (ref 135–145)
Total Bilirubin: 1.1 mg/dL (ref 0.3–1.2)
Total Protein: 5.9 g/dL — ABNORMAL LOW (ref 6.5–8.1)

## 2018-08-27 LAB — CBC
HCT: 33.5 % — ABNORMAL LOW (ref 36.0–46.0)
Hemoglobin: 10.8 g/dL — ABNORMAL LOW (ref 12.0–15.0)
MCH: 30.2 pg (ref 26.0–34.0)
MCHC: 32.2 g/dL (ref 30.0–36.0)
MCV: 93.6 fL (ref 80.0–100.0)
Platelets: 298 10*3/uL (ref 150–400)
RBC: 3.58 MIL/uL — ABNORMAL LOW (ref 3.87–5.11)
RDW: 13.4 % (ref 11.5–15.5)
WBC: 10.1 10*3/uL (ref 4.0–10.5)
nRBC: 0 % (ref 0.0–0.2)

## 2018-08-27 LAB — TSH: TSH: 1.606 u[IU]/mL (ref 0.350–4.500)

## 2018-08-27 LAB — MAGNESIUM: Magnesium: 2.1 mg/dL (ref 1.7–2.4)

## 2018-08-27 LAB — CK: Total CK: 931 U/L — ABNORMAL HIGH (ref 38–234)

## 2018-08-27 MED ORDER — SODIUM CHLORIDE 0.9 % IV SOLN
INTRAVENOUS | Status: AC
  Administered 2018-08-27: 12:00:00 via INTRAVENOUS

## 2018-08-27 MED ORDER — POTASSIUM CHLORIDE CRYS ER 20 MEQ PO TBCR
40.0000 meq | EXTENDED_RELEASE_TABLET | Freq: Once | ORAL | Status: AC
  Administered 2018-08-27: 40 meq via ORAL
  Filled 2018-08-27: qty 2

## 2018-08-27 NOTE — Progress Notes (Signed)
Updated daughter Sunday Spillers with patient progress.

## 2018-08-27 NOTE — Progress Notes (Signed)
Pt received to 5W rm 38. This pt is a resident of [facility]. Pt was oriented to unit and how to call for assistance. Skin assessed with following concerns noted-   --Large bruise on Right Lateral  Upper leg - Stage 2 to R buttocks - Stahe 2 to Right uppper thigh  Bruising   to Right elbow   To treat and monitor pt per   MD and nursing orders

## 2018-08-27 NOTE — Progress Notes (Signed)
  Echocardiogram 2D Echocardiogram has been performed.  [name] [name] 08/27/2018, 3:18 PM

## 2018-08-27 NOTE — Progress Notes (Signed)
Spoke with daughter-in-law Remo Lipps. Got the okay from patient to provide an update.

## 2018-08-27 NOTE — Evaluation (Signed)
Physical Therapy Evaluation Patient Details Name: [name] [name] MRN: [id number] DOB: 04-13-1928 Today's Date: 08/27/2018   History of Present Illness  85 y.o. female admitted on 08/26/18 for fall.  Dx with rhabdomyolysis, hypokalemia, premature atrial contractions, renal insufficiency, and elevated liver enzymes.  Pt with significant PMH of RA, HTN, dementia.    Clinical Impression  Pt is sore, requiring min assist for all mobility to get up and move around with the aide of a RW.  She required less assist the more we moved and reported being sore on her right side and buttocks.  She is adamant that she will not go to SNF for rehab and wants to go home.  I recommend she have a RW for gait stability and she says she thinks she has a BSC that can be placed over her commode which she reports is too low.   PT to follow acutely for deficits listed below.      Follow Up Recommendations SNF(pt is refusing SNF, so max HH PT/OT aide )    Equipment Recommendations  Rolling walker with 5" wheels    Recommendations for Other Services   NA     Precautions / Restrictions Precautions Precautions: Fall Precaution Comments: h/o falls, unsteady      Mobility  Bed Mobility Overal bed mobility: Needs Assistance Bed Mobility: Supine to Sit     Supine to sit: Min assist     General bed mobility comments: Min assist to help support trunk to get to sitting EOB.  Pt needed extra time to attempt and ultimately needed assist.   Transfers Overall transfer level: Needs assistance Equipment used: Rolling walker (2 wheeled);None Transfers: Sit to/from Stand Sit to Stand: Min assist         General transfer comment: Min assist to support trunk during transitions.  Verbal cues for safe RW use.   Ambulation/Gait Ambulation/Gait assistance: Min guard Gait Distance (Feet): 100 Feet Assistive device: Rolling walker (2 wheeled) Gait Pattern/deviations: Step-through pattern;Antalgic Gait velocity:  decreased Gait velocity interpretation: 1.31 - 2.62 ft/sec, indicative of limited community ambulator General Gait Details: pt with mildly antalgic gait pattern favoring her right, more painful leg.          Balance Overall balance assessment: Needs assistance Sitting-balance support: Feet supported;No upper extremity supported Sitting balance-Leahy Scale: Good     Standing balance support: Bilateral upper extremity supported;Single extremity supported;No upper extremity supported Standing balance-Leahy Scale: Fair Standing balance comment: able to pull up her underwear and wipe herself with both hands unsupported at [facility].                              Pertinent Vitals/Pain Pain Assessment: Faces Faces Pain Scale: Hurts even more Pain Location: right leg and bottom Pain Descriptors / Indicators: Grimacing;Guarding Pain Intervention(s): Limited activity within patient's tolerance;Monitored during session;Repositioned    Home Living Family/patient expects to be discharged to:: Private residence Living Arrangements: Alone Available Help at Discharge: Family;Available PRN/intermittently Type of Home: Independent living facility(independent retirement apartment)       Home Layout: One level Home Equipment: Bedside commode;Tub bench;Cane - single point Additional Comments: Pt reports she still drives, bathes and dresses herself and does what cleaning/housework is to be done.      Prior Function Level of Independence: Independent with assistive device(s)         Comments: at times used cane     Hand Dominance   Dominant Hand:  Right    Extremity/Trunk Assessment   Upper Extremity Assessment Upper Extremity Assessment: Defer to OT evaluation    Lower Extremity Assessment Lower Extremity Assessment: Generalized weakness;RLE deficits/detail RLE Deficits / Details: right leg is weaker than left leg, but I believe due to pain (she has some wounds from being on  this side longer.     Cervical / Trunk Assessment Cervical / Trunk Assessment: Normal  Communication   Communication: No difficulties  Cognition Arousal/Alertness: Awake/alert Behavior During Therapy: WFL for tasks assessed/performed Overall Cognitive Status: No family/caregiver present to determine baseline cognitive functioning                                 General Comments: Not specifically tested, conversation normal, h/o dementia listed in chart             Assessment/Plan    PT Assessment Patient needs continued PT services  PT Problem List Decreased strength;Decreased activity tolerance;Decreased balance;Decreased mobility;Decreased knowledge of use of DME;Decreased knowledge of precautions;Pain;Decreased skin integrity       PT Treatment Interventions DME instruction;Gait training;Functional mobility training;Stair training;Therapeutic activities;Therapeutic exercise;Balance training;Cognitive remediation;Patient/family education    PT Goals (Current goals can be found in the Care Plan section)  Acute Rehab PT Goals Patient Stated Goal: to go back home PT Goal Formulation: With patient Time For Goal Achievement: 09/10/18 Potential to Achieve Goals: Good    Frequency Min 3X/week           AM-PAC PT "6 Clicks" Mobility  Outcome Measure Help needed turning from your back to your side while in a flat bed without using bedrails?: A Little Help needed moving from lying on your back to sitting on the side of a flat bed without using bedrails?: A Little Help needed moving to and from a bed to a chair (including a wheelchair)?: A Little Help needed standing up from a chair using your arms (e.g., wheelchair or bedside chair)?: A Little Help needed to walk in hospital room?: A Little Help needed climbing 3-5 steps with a railing? : A Lot 6 Click Score: 17    End of Session Equipment Utilized During Treatment: Gait belt Activity Tolerance: Patient  limited by pain Patient left: in chair;with call bell/phone within reach;with chair alarm set   PT Visit Diagnosis: Muscle weakness (generalized) (M62.81);Difficulty in walking, not elsewhere classified (R26.2);Pain Pain - Right/Left: Right Pain - part of body: Leg    Time: 1335-1404 PT Time Calculation (min) (ACUTE ONLY): 29 min   Charges:      Wells Guiles B. Iyanna Drummer, PT, DPT  Acute Rehabilitation [phone] pager [phone]) [phone] office  @ [facility]: [phone]   PT Evaluation $PT Eval Moderate Complexity: 1 Mod PT Treatments $Gait Training: 8-22 mins       08/27/2018, 2:14 PM

## 2018-08-27 NOTE — Progress Notes (Addendum)
[facility] PROGRESS NOTE  Tarika Mckethan EVO:[id number] DOB: 03/02/1928 DOA: 08/26/2018 PCP: Wynetta Emery Merrilee Seashore), MD (Inactive)  Assessment/Plan:  Rhabdomyolysis: Acute.  Patient presented after sliding down from the toilet seat unable to get back up. Possibly on the floor since July 2.  Creatinine kinase 2010 on admission. Today 931. Urinalysis with few bacteria no leuks 0-5 WBC. Afebrile and non-toxic appearing. Patient received a total of 1 L normal saline IV fluids in the ED. -continue IV fluids at 100/hr. -Strict intake and output --Recheck CK in a.m. -await PT eval for possible placement  Hypokalemia: Acute.  Potassium 3.2 on admission and 3.1 today. Mag level within limits of normal.   Patient on diuretics which likely could account for this. -Repeat 40 mEq of potassium x1 dose -Continue to monitor and replace as needed  Premature atrial contractions, history of aortic stenosis: Patient noted to have frequent PACs.  Evaluated previously by Dr. Sallyanne Kuster cardiology in 2014.  Last echocardiogram from 2014 was reported to be similar to 2009. -Check echocardiogram in a.m. -obtain TSH -EKG as needed -Follow-up telemetry overnight  Atrial fibrillation. No documented hx of same. Hr 133. ekg with SR with PVC's. Converted back to SR spontaneously -obtain TSH -monitor on tele -follow echo  Renal insufficiency: Patient's creatinine just mildly elevated at 1.14 on admission. This am 0.98.  However, only available baseline creatinine noted to be 0.9 was from back in 2014. -Hold nephrotoxic agents -Continue IV fluids as seen above -monitor urine output  Essential hypertension: only fair control. SBP range 107-160. Home meds include HCTZ.  -Hold hydrochlorothiazide due to acute kidney -hydralazine IV as needed for elevated systolic blood pressure  Normocytic normochromic anemia: Chronic.  Hemoglobin 11.6 on admission which appears similar to previous. -Continue to  monitor  Dementia: Patient with mild dementia. -Continue Aricept -Social work consulted for possible need to be moved from independent living  Elevated liver enzymes: Acute.  Likely related to #1. trending down this am.  -Recheck CMP in a.m.  Rheumatoid arthritis: Patient previously used to be on methotrexate, but does not appear in her current medication list.  -Will likely need to follow-up with primary care provider to see if this medication is still prescribed as office visits not visible  Hyperlipidemia -Hold lovastatin for now given elevated liver enzymes  Fall. Patient denies fall and states "I could not get up from commode so slid off". CT head without acute abnormality. Troponin elevated but likely related to #1. ekg as noted above.  Does not use cane or walker. -PT eval -orthostatic VS -echo   Code Status: full Family Communication: Daughter on phone. Willing to consider assisted living and/or snf if recommended Disposition Plan: to be determined   Consultants:    Procedures:  echo  Antibiotics:    HPI/Subjective: 85 yo hx mild dementia, htn, rheumatoid arthritis   Objective: Vitals:   08/26/18 1415 08/26/18 2026  BP: (!) 166/86 107/65  Pulse:  80  Resp: 16 18  Temp:  98.2 F (36.8 C)  SpO2:  97%    Intake/Output Summary (Last 24 hours) at 08/27/2018 1110 Last data filed at 08/27/2018 2993 Gross per 24 hour  Intake 1723.03 ml  Output 500 ml  Net 1223.03 ml   Filed Weights   08/26/18 1317  Weight: 86.2 kg    Exam:   General:  Sitting in chair. Alert oriented to self and place no acute distress  Cardiovascular: irregularly irregualr no mgr no LE edema  Respiratory: normal effort  BS clear bilaterally no wheeze  Abdomen: obese soft +BS no guarding or rebounding  Musculoskeletal: bruising  Left hip and shoulder  Data Reviewed: Basic Metabolic Panel: Recent Labs  Lab 08/26/18 1415 08/27/18 0224  NA 152* 142  K 3.2* 3.1*  CL  115* 111  CO2 23 22  GLUCOSE 98 137*  BUN 50* 41*  CREATININE 1.14* 0.98  CALCIUM 9.9 8.3*  MG  --  2.1   Liver Function Tests: Recent Labs  Lab 08/26/18 1415 08/27/18 0224  AST 157* 99*  ALT 91* 64*  ALKPHOS 41 34*  BILITOT 1.1 1.1  PROT 7.5 5.9*  ALBUMIN 3.3* 2.4*   No results for input(s): LIPASE, AMYLASE in the last 168 hours. No results for input(s): AMMONIA in the last 168 hours. CBC: Recent Labs  Lab 08/26/18 1415 08/27/18 0224  WBC 10.4 10.1  NEUTROABS 7.5  --   HGB 11.6* 10.8*  HCT 36.9 33.5*  MCV 96.1 93.6  PLT 342 298   Cardiac Enzymes: Recent Labs  Lab 08/26/18 1415 08/27/18 0224  CKTOTAL 2,014* 931*   BNP (last 3 results) No results for input(s): BNP in the last 8760 hours.  ProBNP (last 3 results) No results for input(s): PROBNP in the last 8760 hours.  CBG: Recent Labs  Lab 08/26/18 1404  GLUCAP 85    Recent Results (from the past 240 hour(s))  SARS Coronavirus 2 (CEPHEID - Performed in [place] hospital lab), Hosp Order     Status: None   Collection Time: 08/26/18  4:23 PM   Specimen: Nasopharyngeal Swab  Result Value Ref Range Status   SARS Coronavirus 2 NEGATIVE NEGATIVE Final    Comment: (NOTE) If result is NEGATIVE SARS-CoV-2 target nucleic acids are NOT DETECTED. The SARS-CoV-2 RNA is generally detectable in upper and lower  respiratory specimens during the acute phase of infection. The lowest  concentration of SARS-CoV-2 viral copies this assay can detect is 250  copies / mL. A negative result does not preclude SARS-CoV-2 infection  and should not be used as the sole basis for treatment or other  patient management decisions.  A negative result may occur with  improper specimen collection / handling, submission of specimen other  than nasopharyngeal swab, presence of viral mutation(s) within the  areas targeted by this assay, and inadequate number of viral copies  (<250 copies / mL). A negative result must be combined  with clinical  observations, patient history, and epidemiological information. If result is POSITIVE SARS-CoV-2 target nucleic acids are DETECTED. The SARS-CoV-2 RNA is generally detectable in upper and lower  respiratory specimens dur ing the acute phase of infection.  Positive  results are indicative of active infection with SARS-CoV-2.  Clinical  correlation with patient history and other diagnostic information is  necessary to determine patient infection status.  Positive results do  not rule out bacterial infection or co-infection with other viruses. If result is PRESUMPTIVE POSTIVE SARS-CoV-2 nucleic acids MAY BE PRESENT.   A presumptive positive result was obtained on the submitted specimen  and confirmed on repeat testing.  While 2019 novel coronavirus  (SARS-CoV-2) nucleic acids may be present in the submitted sample  additional confirmatory testing may be necessary for epidemiological  and / or clinical management purposes  to differentiate between  SARS-CoV-2 and other Sarbecovirus currently known to infect humans.  If clinically indicated additional testing with an alternate test  methodology [phone]) is advised. The SARS-CoV-2 RNA is generally  detectable in upper and lower  respiratory sp ecimens during the acute  phase of infection. The expected result is Negative. Fact Sheet for Patients:  StrictlyIdeas.no Fact Sheet for Healthcare Providers: BankingDealers.co.za This test is not yet approved or cleared by the [place] FDA and has been authorized for detection and/or diagnosis of SARS-CoV-2 by FDA under an Emergency Use Authorization (EUA).  This EUA will remain in effect (meaning this test can be used) for the duration of the COVID-19 declaration under Section 564(b)(1) of the Act, 21 U.S.C. section 360bbb-3(b)(1), unless the authorization is terminated or revoked sooner. Performed at [facility], [place] [address] [place], [place] [address]      Studies: Ct Head Wo Contrast  Result Date: 08/26/2018 CLINICAL DATA:  Brought in by EMS after neighbors and family had not heard from her in several days. Pt was found in floor by EMS. EXAM: CT HEAD WITHOUT CONTRAST CT CERVICAL SPINE WITHOUT CONTRAST TECHNIQUE: Multidetector CT imaging of the head and cervical spine was performed following the standard protocol without intravenous contrast. Multiplanar CT image reconstructions of the cervical spine were also generated. COMPARISON:  03/11/2003 FINDINGS: CT HEAD FINDINGS Brain: No evidence of acute infarction, hemorrhage, hydrocephalus, extra-axial collection or mass lesion/mass effect. Mild periventricular white matter hypoattenuation is noted consistent with chronic microvascular ischemic change. Vascular: No hyperdense vessel or unexpected calcification. Skull: Normal. Negative for fracture or focal lesion. Sinuses/Orbits: Globes and orbits are unremarkable. Visualized sinuses and mastoid air cells are clear. Other: None. CT CERVICAL SPINE FINDINGS Alignment: Normal. Skull base and vertebrae: No acute fracture. No primary bone lesion or focal pathologic process. Soft tissues and spinal canal: No prevertebral fluid or swelling. No visible canal hematoma. No masses.  No enlarged lymph nodes. Disc levels: Discs are well maintained in height. Minor spondylotic disc bulging at C5-C6 and C6-C7. No evidence of a disc herniation. There are facet degenerative changes bilaterally most notable on the left at C4-C5. Upper chest: No acute findings. Other: None. IMPRESSION: HEAD CT 1. No acute intracranial abnormalities. 2. Mild chronic microvascular ischemic change. CERVICAL CT 1. No fracture or acute finding. Electronically Signed   By: Lajean Manes M.D.   On: 08/26/2018 15:23   Ct Cervical Spine Wo Contrast  Result Date: 08/26/2018 CLINICAL DATA:  Brought in by EMS after neighbors and family had not heard from her in several  days. Pt was found in floor by EMS. EXAM: CT HEAD WITHOUT CONTRAST CT CERVICAL SPINE WITHOUT CONTRAST TECHNIQUE: Multidetector CT imaging of the head and cervical spine was performed following the standard protocol without intravenous contrast. Multiplanar CT image reconstructions of the cervical spine were also generated. COMPARISON:  03/11/2003 FINDINGS: CT HEAD FINDINGS Brain: No evidence of acute infarction, hemorrhage, hydrocephalus, extra-axial collection or mass lesion/mass effect. Mild periventricular white matter hypoattenuation is noted consistent with chronic microvascular ischemic change. Vascular: No hyperdense vessel or unexpected calcification. Skull: Normal. Negative for fracture or focal lesion. Sinuses/Orbits: Globes and orbits are unremarkable. Visualized sinuses and mastoid air cells are clear. Other: None. CT CERVICAL SPINE FINDINGS Alignment: Normal. Skull base and vertebrae: No acute fracture. No primary bone lesion or focal pathologic process. Soft tissues and spinal canal: No prevertebral fluid or swelling. No visible canal hematoma. No masses.  No enlarged lymph nodes. Disc levels: Discs are well maintained in height. Minor spondylotic disc bulging at C5-C6 and C6-C7. No evidence of a disc herniation. There are facet degenerative changes bilaterally most notable on the left at C4-C5. Upper chest: No acute findings.  Other: None. IMPRESSION: HEAD CT 1. No acute intracranial abnormalities. 2. Mild chronic microvascular ischemic change. CERVICAL CT 1. No fracture or acute finding. Electronically Signed   By: Lajean Manes M.D.   On: 08/26/2018 15:23   Dg Pelvis Portable  Result Date: 08/26/2018 CLINICAL DATA:  Pelvic pain after fall today.  Patient found down. EXAM: PORTABLE PELVIS 1-2 VIEWS COMPARISON:  None. FINDINGS: There is no evidence of pelvic fracture or diastasis. No pelvic bone lesions are seen. Lower lumbar degenerative disease noted. IMPRESSION: No acute abnormality.  Electronically Signed   By: Inge Rise M.D.   On: 08/26/2018 13:22   Dg Chest Portable 1 View  Result Date: 08/26/2018 CLINICAL DATA:  Found down.  Pain after falling. EXAM: PORTABLE CHEST 1 VIEW COMPARISON:  Radiographs 01/13/2013. FINDINGS: 1300 hours. The heart size and mediastinal contours are stable. There is aortic atherosclerosis. The lungs are clear. There is no pleural effusion or pneumothorax. No acute osseous findings are identified. Telemetry leads overlie the chest. IMPRESSION: Stable chest.  No evidence of acute cardiopulmonary process. Electronically Signed   By: Richardean Sale M.D.   On: 08/26/2018 13:24    Scheduled Meds: . aspirin EC  81 mg Oral Daily  . donepezil  10 mg Oral QHS  . enoxaparin (LOVENOX) injection  40 mg Subcutaneous Q24H  . potassium chloride  40 mEq Oral Once  . sertraline  100 mg Oral Daily  . sodium chloride flush  3 mL Intravenous Q12H   Continuous Infusions: . sodium chloride 100 mL/hr at 08/27/18 0744    Principal Problem:   Rhabdomyolysis Active Problems:   Renal insufficiency   Hyperlipidemia   Aortic stenosis   Rheumatoid arthritis (HCC)   Normocytic anemia   Pressure injury of skin    Time spent: 40 minutes     [place] NP  [facility]  If 7PM-7AM, please contact night-coverage at [email or web address], password [facility] 08/27/2018, 11:10 AM  LOS: 0 days

## 2018-08-28 DIAGNOSIS — T796XXD Traumatic ischemia of muscle, subsequent encounter: Secondary | ICD-10-CM

## 2018-08-28 DIAGNOSIS — R748 Abnormal levels of other serum enzymes: Secondary | ICD-10-CM

## 2018-08-28 LAB — COMPREHENSIVE METABOLIC PANEL
ALT: 58 U/L — ABNORMAL HIGH (ref 0–44)
AST: 66 U/L — ABNORMAL HIGH (ref 15–41)
Albumin: 2.7 g/dL — ABNORMAL LOW (ref 3.5–5.0)
Alkaline Phosphatase: 39 U/L (ref 38–126)
Anion gap: 8 (ref 5–15)
BUN: 30 mg/dL — ABNORMAL HIGH (ref 8–23)
CO2: 22 mmol/L (ref 22–32)
Calcium: 8.6 mg/dL — ABNORMAL LOW (ref 8.9–10.3)
Chloride: 109 mmol/L (ref 98–111)
Creatinine, Ser: 0.9 mg/dL (ref 0.44–1.00)
GFR calc Af Amer: >60 mL/min (ref >60)
GFR calc non Af Amer: 57 mL/min — ABNORMAL LOW (ref >60)
Glucose, Bld: 120 mg/dL — ABNORMAL HIGH (ref 70–99)
Potassium: 3.9 mmol/L (ref 3.5–5.1)
Sodium: 139 mmol/L (ref 135–145)
Total Bilirubin: 0.6 mg/dL (ref 0.3–1.2)
Total Protein: 6.3 g/dL — ABNORMAL LOW (ref 6.5–8.1)

## 2018-08-28 LAB — URINE CULTURE: Culture: <10000 — AB

## 2018-08-28 LAB — CK: Total CK: 459 U/L — ABNORMAL HIGH (ref 38–234)

## 2018-08-28 NOTE — TOC Transition Note (Addendum)
Transition of Care [facility]) - CM/SW Discharge Note   Patient Details  Name: [name] [name] MRN: [id number] Date of Birth: 04/03/1928  [facility]) CM/SW Contact:  Sharin Mons, RN Phone Number: 08/28/2018, 4:43 PM   Clinical Narrative:    Admitted for fall. Resides alone. Pt will transition to home with home health services.   Pt declined SNF placement. Agreeable to home health services. Choice provided. Pt daughter states family will help assist with supervision. Pt selected [facility].DME : rolling walker will be delivered to bedside prior to d/c. Pt states has transportation to home.  Sunday Spillers "[name]" DavRovena Hearld (Son)       [phone]        PCP: Wynetta Emery  Final next level of care: Home w Home Health Services(Pt /family declined SNF placement) Barriers to Discharge: No Barriers Identified   Patient Goals and CMS Choice Patient states their goals for this hospitalization and ongoing recovery are:: to go home CMS Medicare.gov Compare Post Acute Care list provided to:: Patient Choice offered to / list presented to : Patient  Discharge Placement                       Discharge Plan and Services                DME Arranged: Walker rolling DME Agency: [employer] Date DME Agency Contacted: 08/28/18 Time DME Agency Contacted: [phone] Representative spoke with at DME Agency: [place]: RN, PT, OT [place] Agency: [facility] Date [place]: 08/28/18 Time [place]: 1642 Representative spoke with at [place]: [place] ([place]) Interventions     Readmission Risk Interventions No flowsheet data found.

## 2018-08-28 NOTE — Care Management Important Message (Signed)
Important Message  Patient Details  Name: [name] [name] MRN: [id number] Date of Birth: 02/25/1928   Medicare Important Message Given:  Yes     [name] 08/28/2018, 12:46 PM

## 2018-08-28 NOTE — Progress Notes (Signed)
Occupational Therapy Evaluation Patient Details Name: [name] [name] MRN: [id number] DOB: [birth year] Today's Date: 08/28/2018    History of Present Illness 85 y.o. female admitted on 08/26/18 for fall.  Dx with rhabdomyolysis, hypokalemia, premature atrial contractions, renal insufficiency, and elevated liver enzymes.  Pt with significant PMH of RA, HTN, dementia.     Clinical Impression   PTA, pt was living at home alone, and reports independence with ADL/IADL and modified independence with ADL. Pt currently requires minguard-minA for ADL completion and functional mobility at RW level. Pt demonstrates cognitive limitations and physical limitation impacting safety and independence with ADL/IADL and functional mobility. Spoke with pt's daughter, [name] [name], via phone and discussed pt's current level of functioning and level of assistance required.  Due to decline in current level of function, pt would benefit from acute OT to address established goals to facilitate safe D/C to venue listed below. At this time, recommend SNF follow-up. Pt non-agreeable to SNF level therapy and prefers to continue home with HHOT/HHPT. Will continue to follow acutely.     Follow Up Recommendations  SNF (pt non agreeable to SNF, will require HHOT/HHPT aide if returning home)   Equipment Recommendations  None recommended by OT    Recommendations for Other Services       Precautions / Restrictions Precautions Precautions: Fall Precaution Comments: h/o falls, unsteady Restrictions Weight Bearing Restrictions: No      Mobility Bed Mobility               General bed mobility comments: sitting in recliner upon arrival  Transfers Overall transfer level: Needs assistance Equipment used: Rolling walker (2 wheeled);None Transfers: Sit to/from Stand Sit to Stand: Min assist         General transfer comment: Min assist to support trunk during transitions.  Verbal cues for safe RW use.     Balance Overall  balance assessment: Needs assistance Sitting-balance support: Feet supported;No upper extremity supported Sitting balance-Leahy Scale: Good     Standing balance support: Bilateral upper extremity supported;Single extremity supported;No upper extremity supported Standing balance-Leahy Scale: Fair Standing balance comment: able to perform posterior pericare with minA for stability and single UE support                           ADL either performed or assessed with clinical judgement   ADL Overall ADL's : Needs assistance/impaired Eating/Feeding: Set up;Sitting   Grooming: Min guard;Wash/dry hands;Standing   Upper Body Bathing: Min guard   Lower Body Bathing: Min guard   Upper Body Dressing : Min guard   Lower Body Dressing: Min guard   Toilet Transfer: Min guard;Minimal assistance;Ambulation   Toileting- Clothing Manipulation and Hygiene: Minimal assistance;Min guard;Sit to/from stand       Functional mobility during ADLs: Min guard;Minimal assistance;Rolling walker General ADL Comments: minguard-minA for stability during functional mobility;increased level of assistance for more dynamic level balance     Vision Baseline Vision/History: Wears glasses Patient Visual Report: No change from baseline       Perception     Praxis      Pertinent Vitals/Pain Faces Pain Scale: Hurts even more Pain Location: right leg and bottom Pain Descriptors / Indicators: Grimacing;Guarding     Hand Dominance Right   Extremity/Trunk Assessment Upper Extremity Assessment Upper Extremity Assessment: Defer to OT evaluation   Lower Extremity Assessment Lower Extremity Assessment: Defer to PT evaluation RLE Deficits / Details: right leg is weaker than left leg, but I  believe due to pain (she has some wounds from being on this side longer.    Cervical / Trunk Assessment Cervical / Trunk Assessment: Normal   Communication Communication Communication: No difficulties    Cognition Arousal/Alertness: Awake/alert Behavior During Therapy: WFL for tasks assessed/performed Overall Cognitive Status: No family/caregiver present to determine baseline cognitive functioning                                 General Comments: completed the medication transfer screen from the medi-cog, pt scored 1/5;discussed medication management with pt and importance of having family assist and check with adherence to medication management, discussed this as well with pt's daughter [name] [name], who states she will be monitoring medication management;scored 9 on Short Blessed Test indicating questionable impairment, pt has h/o dementia listed in chart   General Comments  2 wounds on buttocks, RN present to place protective pad;educated pt on importance of keeping areas clean and dry;discussed session with Pt's daughter [name] [name] via telephone at [phone], answered daughter's questions regarding pt's mobility and safety and compensatory strategies to maximize safety and independence with return home    Exercises     Shoulder Instructions      Home Living Family/patient expects to be discharged to:: Private residence Living Arrangements: Alone Available Help at Discharge: Family;Available PRN/intermittently Type of Home: Independent living facility(independent retirement apartment) Home Access: Level entry     Home Layout: One level     Bathroom Shower/Tub: Teacher, early years/pre: Standard     Home Equipment: Bedside commode;Tub bench;Cane - single point          Prior Functioning/Environment Level of Independence: Independent with assistive device(s)        Comments: at times used cane;Pt reports she still drives, bathes and dresses herself and does what cleaning/housework is to be done.          OT Problem List: Decreased activity tolerance;Impaired balance (sitting and/or standing);Decreased cognition;Decreased safety awareness;Decreased  knowledge of use of DME or AE;Decreased knowledge of precautions      OT Treatment/Interventions: Self-care/ADL training;Therapeutic exercise;Energy conservation;DME and/or AE instruction;Therapeutic activities;Cognitive remediation/compensation;Patient/family education;Balance training    OT Goals(Current goals can be found in the care plan section) Acute Rehab OT Goals Patient Stated Goal: to go back home OT Goal Formulation: With patient Time For Goal Achievement: 09/11/18 Potential to Achieve Goals: Good ADL Goals Pt Will Perform Grooming: with modified independence;standing;sitting Pt Will Perform Upper Body Dressing: with modified independence Pt Will Perform Lower Body Dressing: with modified independence;sit to/from stand Pt Will Transfer to Toilet: with modified independence;ambulating Additional ADL Goal #1: Pt will demonstrate independence with 3 compensatory strategies for short term memory during ADL/IADL completion.  OT Frequency: Min 2X/week   Barriers to D/C: Decreased caregiver support  pt lives alone       Co-evaluation              AM-PAC OT "6 Clicks" Daily Activity     Outcome Measure Help from another person eating meals?: None Help from another person taking care of personal grooming?: A Little Help from another person toileting, which includes using toliet, bedpan, or urinal?: A Little Help from another person bathing (including washing, rinsing, drying)?: A Little Help from another person to put on and taking off regular upper body clothing?: A Little Help from another person to put on and taking off regular lower body clothing?: A Little 6 Click  Score: 19   End of Session Equipment Utilized During Treatment: Gait belt;Rolling walker Nurse Communication: Mobility status  Activity Tolerance: Patient tolerated treatment well Patient left: in chair;with call bell/phone within reach;with chair alarm set  OT Visit Diagnosis: Unsteadiness on feet  (R26.81);Other abnormalities of gait and mobility (R26.89);Muscle weakness (generalized) (M62.81);Other symptoms and signs involving cognitive function                Time: 1020-1100 OT Time Calculation (min): 40 min Charges:  OT General Charges $OT Visit: 1 Visit OT Evaluation $OT Eval Moderate Complexity: 1 Mod OT Treatments $Self Care/Home Management : 8-22 mins $Cognitive Funtion inital: Initial 15 mins  Dorinda Hill OTR/L Acute Rehabilitation Services Office: [phone]   Wyn Forster 08/28/2018, 11:16 AM

## 2018-08-28 NOTE — Discharge Summary (Signed)
Physician Discharge Summary  [name] BZJ:[id number] DOB: November 11, 1928 DOA: 08/26/2018  PCP: Wynetta Emery Merrilee Seashore), MD (Inactive)  Admit date: 08/26/2018 Discharge date: 08/28/2018  Time spent: 45 minutes  Recommendations for Outpatient Follow-up:  1. Follow up with PCP 1-2 weeks for evaluation of symptoms. Evaluate need for diuretics.  Recommend CMET to track electrolytes and monitor kidney function and LFT 2. Follow up with Dr Sallyanne Kuster 1-2 weeks for evaluation of heart rate and rhythm 3. HH PT/OT, RN, Aid Social worker requested for evaluation/assessment of strength, endurance, gait stability, functional status, safety.    Discharge Diagnoses:  Principal Problem:   Rhabdomyolysis Active Problems:   Renal insufficiency   Atrial fibrillation (HCC)   Hypernatremia   Hyperlipidemia   Aortic stenosis   Rheumatoid arthritis (HCC)   Normocytic anemia   Pressure injury of skin   Discharge Condition: stable  Diet recommendation: heart healthy  Filed Weights   08/26/18 1317  Weight: 86.2 kg    History of present illness:  [name] [name] is a very pleasant slightly demented 85 y.o. female with medical history significant of hypertension, rheumatoid arthritis, premature atrial contractions, mild aortic stenosis, and dementia; who presented 7/7 after sliding down from her toilet seat and being unable to get up.  Patient reported that she did not fall or have a loss of consciousness.  She was able to crawl to the other side of the apartment and get her cell phone to call for help, but reported that it had died.  Patient reported that her daughter normally comes to check on her daily, but had gone out of town recently. The manger of the apartment complex said patient had stated that she was fine after someone had noted and not seen her recently.  Daughter noted that her mother  very stubborn and does not like people in her business.  Today when the daughter was unable to get a hold of  her she sent someone over to the house to check on her.  The friend went to the door and patient reported that she was unable to get to the door to open it.  Her daughter at that time called EMS.  Newspapers were reported to have been at the door since July 2.  Patient denied having any pain.  She reported having previous history of irregular heart rhythm.  Review of records showed patient was previously evaluated for frequent premature atrial contractions by Dr. Sallyanne Kuster in 07/2012, but was thought to be benign at that time.  Hospital Course:  Rhabdomyolysis: Acute. Resolving at discharge.  Patient presented after sliding down from the toilet seat as she was unable to rise off of it.  Possibly on the floor since July 2. Creatinine kinase 2010 on admission. At discharge CK 459. Urinalysis with few bacteria no leuks 0-5 WBC. Afebrile and non-toxic appearing. Patient received vigorous IV fluids.  Hypokalemia:Acute. Resolved at discharge.  Potassium 3.2 on admission. Mag level within limits of normal.  Patient on diuretics which likely could account for this.  Premature atrial contractions, history of aortic stenosis: Patient noted to have frequent PACs. Evaluated previously by Dr. Croitorucardiology in 2014. echo reveals EF 55% with normal cavity size, moderate asymmetric left ventricular hypertrophy. TSH 1.6. follow up with Dr. Sallyanne Kuster 1-2 weeks  Atrial fibrillation. No documented hx of same. Hr 133. ekg with SR with PVC's. Converted back to SR spontaneously. No further episodes. tsh 1.6. recommend follow up with cardiology  Renal insufficiency: Patient's creatinine just mildly elevated at  1.14 on admission. On discharge 0.9o. However, only available baseline creatinine noted to be 0.9 was from back in 2014.  Essential hypertension:  fair control.  Home meds include HCTZ. Resume home meds. Follow up with PCP 1-2 weeks for evaluation of BP control  Normocytic normochromic anemia: Chronic.  Hemoglobin 11.6 on admission which appears similar to previous.  Dementia: Patient with mild dementia. Continue Aricept -Social work consulted for possible need to be moved from independent living. Patient refused. HH ordered. Advised patient and daughter to formulate a plan for the time when patient no longer able to manage at home safely  Elevated liver enzymes: Acute.  Likely related to #1. Resolving at discharge.   Rheumatoid arthritis: Patient previously used to be on methotrexate, but does not appear in her current medication list.   Hyperlipidemia. Resume home meds.   Hypernatremia: resolved at discharge.   Fall. Patient denies fall and states "I could not get up from commode so slid off". CT head without acute abnormality. Troponin elevated but likely related to #1. ekg as noted above.  Does not use cane or walker. PT recommends snf. Patient refuses. HH oredered.   Procedures: echo Consultations:    Discharge Exam: Vitals:   08/27/18 2151 08/28/18 0525  BP: 112/76 (!) 150/70  Pulse: 61 (!) 51  Resp: 18 16  Temp: 97.9 F (36.6 C) (!) 97.4 F (36.3 C)  SpO2: 100% 100%    General: sitting in chair. Alert and oriented to self and place Cardiovascular: rrr no mgr trace LE edema Respiratory: normal effort BS clear bilaterally no wheeze  Discharge Instructions   Discharge Instructions    Call MD for:  difficulty breathing, headache or visual disturbances   Complete by: As directed    Call MD for:  persistant dizziness or light-headedness   Complete by: As directed    Call MD for:  temperature >100.4   Complete by: As directed    Diet - low sodium heart healthy   Complete by: As directed    Discharge instructions   Complete by: As directed    Take medications as prescribed Maintain hydration Follow up with PCP 1-2 weeks for evaluation of BP control, functional status. HH PT/OT, RN, aid, social worker to evaluate/assess functional status, gait, strength  and endurence   Increase activity slowly   Complete by: As directed      Allergies as of 08/28/2018      Reactions   Codeine Nausea And Vomiting, Other (See Comments)   Dizziness      Medication List    STOP taking these medications   levofloxacin 750 MG tablet Commonly known as: Levaquin     TAKE these medications   aspirin EC 81 MG tablet Take 81 mg by mouth daily.   calcium-vitamin D 500-200 MG-UNIT tablet Commonly known as: OSCAL WITH D Take 1 tablet by mouth daily.   donepezil 10 MG tablet Commonly known as: ARICEPT Take 10 mg by mouth at bedtime.   fish oil-omega-3 fatty acids 1000 MG capsule Take 1 g by mouth daily.   folic acid 1 MG tablet Commonly known as: FOLVITE Take 1 mg by mouth daily.   hydrochlorothiazide 25 MG tablet Commonly known as: HYDRODIURIL Take 25 mg by mouth daily.   lovastatin 40 MG tablet Commonly known as: MEVACOR Take 40 mg by mouth daily.   sertraline 100 MG tablet Commonly known as: ZOLOFT Take 100 mg by mouth daily.  Durable Medical Equipment  (From admission, onward)         Start     Ordered   08/28/18 1207  For home use only DME Walker rolling  Once    Question:  Patient needs a walker to treat with the following condition  Answer:  Physical deconditioning   08/28/18 1207         Allergies  Allergen Reactions  . Codeine Nausea And Vomiting and Other (See Comments)    Dizziness      The results of significant diagnostics from this hospitalization (including imaging, microbiology, ancillary and laboratory) are listed below for reference.    Significant Diagnostic Studies: Ct Head Wo Contrast  Result Date: 08/26/2018 CLINICAL DATA:  Brought in by EMS after neighbors and family had not heard from her in several days. Pt was found in floor by EMS. EXAM: CT HEAD WITHOUT CONTRAST CT CERVICAL SPINE WITHOUT CONTRAST TECHNIQUE: Multidetector CT imaging of the head and cervical spine was performed  following the standard protocol without intravenous contrast. Multiplanar CT image reconstructions of the cervical spine were also generated. COMPARISON:  03/11/2003 FINDINGS: CT HEAD FINDINGS Brain: No evidence of acute infarction, hemorrhage, hydrocephalus, extra-axial collection or mass lesion/mass effect. Mild periventricular white matter hypoattenuation is noted consistent with chronic microvascular ischemic change. Vascular: No hyperdense vessel or unexpected calcification. Skull: Normal. Negative for fracture or focal lesion. Sinuses/Orbits: Globes and orbits are unremarkable. Visualized sinuses and mastoid air cells are clear. Other: None. CT CERVICAL SPINE FINDINGS Alignment: Normal. Skull base and vertebrae: No acute fracture. No primary bone lesion or focal pathologic process. Soft tissues and spinal canal: No prevertebral fluid or swelling. No visible canal hematoma. No masses.  No enlarged lymph nodes. Disc levels: Discs are well maintained in height. Minor spondylotic disc bulging at C5-C6 and C6-C7. No evidence of a disc herniation. There are facet degenerative changes bilaterally most notable on the left at C4-C5. Upper chest: No acute findings. Other: None. IMPRESSION: HEAD CT 1. No acute intracranial abnormalities. 2. Mild chronic microvascular ischemic change. CERVICAL CT 1. No fracture or acute finding. Electronically Signed   By: Lajean Manes M.D.   On: 08/26/2018 15:23   Ct Cervical Spine Wo Contrast  Result Date: 08/26/2018 CLINICAL DATA:  Brought in by EMS after neighbors and family had not heard from her in several days. Pt was found in floor by EMS. EXAM: CT HEAD WITHOUT CONTRAST CT CERVICAL SPINE WITHOUT CONTRAST TECHNIQUE: Multidetector CT imaging of the head and cervical spine was performed following the standard protocol without intravenous contrast. Multiplanar CT image reconstructions of the cervical spine were also generated. COMPARISON:  03/11/2003 FINDINGS: CT HEAD FINDINGS  Brain: No evidence of acute infarction, hemorrhage, hydrocephalus, extra-axial collection or mass lesion/mass effect. Mild periventricular white matter hypoattenuation is noted consistent with chronic microvascular ischemic change. Vascular: No hyperdense vessel or unexpected calcification. Skull: Normal. Negative for fracture or focal lesion. Sinuses/Orbits: Globes and orbits are unremarkable. Visualized sinuses and mastoid air cells are clear. Other: None. CT CERVICAL SPINE FINDINGS Alignment: Normal. Skull base and vertebrae: No acute fracture. No primary bone lesion or focal pathologic process. Soft tissues and spinal canal: No prevertebral fluid or swelling. No visible canal hematoma. No masses.  No enlarged lymph nodes. Disc levels: Discs are well maintained in height. Minor spondylotic disc bulging at C5-C6 and C6-C7. No evidence of a disc herniation. There are facet degenerative changes bilaterally most notable on the left at C4-C5. Upper chest: No acute findings.  Other: None. IMPRESSION: HEAD CT 1. No acute intracranial abnormalities. 2. Mild chronic microvascular ischemic change. CERVICAL CT 1. No fracture or acute finding. Electronically Signed   By: Lajean Manes M.D.   On: 08/26/2018 15:23   Dg Pelvis Portable  Result Date: 08/26/2018 CLINICAL DATA:  Pelvic pain after fall today.  Patient found down. EXAM: PORTABLE PELVIS 1-2 VIEWS COMPARISON:  None. FINDINGS: There is no evidence of pelvic fracture or diastasis. No pelvic bone lesions are seen. Lower lumbar degenerative disease noted. IMPRESSION: No acute abnormality. Electronically Signed   By: Inge Rise M.D.   On: 08/26/2018 13:22   Dg Chest Portable 1 View  Result Date: 08/26/2018 CLINICAL DATA:  Found down.  Pain after falling. EXAM: PORTABLE CHEST 1 VIEW COMPARISON:  Radiographs 01/13/2013. FINDINGS: 1300 hours. The heart size and mediastinal contours are stable. There is aortic atherosclerosis. The lungs are clear. There is no  pleural effusion or pneumothorax. No acute osseous findings are identified. Telemetry leads overlie the chest. IMPRESSION: Stable chest.  No evidence of acute cardiopulmonary process. Electronically Signed   By: Richardean Sale M.D.   On: 08/26/2018 13:24    Microbiology: Recent Results (from the past 240 hour(s))  SARS Coronavirus 2 (CEPHEID - Performed in [facility] hospital lab), Hosp Order     Status: None   Collection Time: 08/26/18  4:23 PM   Specimen: Nasopharyngeal Swab  Result Value Ref Range Status   SARS Coronavirus 2 NEGATIVE NEGATIVE Final    Comment: (NOTE) If result is NEGATIVE SARS-CoV-2 target nucleic acids are NOT DETECTED. The SARS-CoV-2 RNA is generally detectable in upper and lower  respiratory specimens during the acute phase of infection. The lowest  concentration of SARS-CoV-2 viral copies this assay can detect is 250  copies / mL. A negative result does not preclude SARS-CoV-2 infection  and should not be used as the sole basis for treatment or other  patient management decisions.  A negative result may occur with  improper specimen collection / handling, submission of specimen other  than nasopharyngeal swab, presence of viral mutation(s) within the  areas targeted by this assay, and inadequate number of viral copies  (<250 copies / mL). A negative result must be combined with clinical  observations, patient history, and epidemiological information. If result is POSITIVE SARS-CoV-2 target nucleic acids are DETECTED. The SARS-CoV-2 RNA is generally detectable in upper and lower  respiratory specimens dur ing the acute phase of infection.  Positive  results are indicative of active infection with SARS-CoV-2.  Clinical  correlation with patient history and other diagnostic information is  necessary to determine patient infection status.  Positive results do  not rule out bacterial infection or co-infection with other viruses. If result is PRESUMPTIVE  POSTIVE SARS-CoV-2 nucleic acids MAY BE PRESENT.   A presumptive positive result was obtained on the submitted specimen  and confirmed on repeat testing.  While 2019 novel coronavirus  (SARS-CoV-2) nucleic acids may be present in the submitted sample  additional confirmatory testing may be necessary for epidemiological  and / or clinical management purposes  to differentiate between  SARS-CoV-2 and other Sarbecovirus currently known to infect humans.  If clinically indicated additional testing with an alternate test  methodology [phone]) is advised. The SARS-CoV-2 RNA is generally  detectable in upper and lower respiratory sp ecimens during the acute  phase of infection. The expected result is Negative. Fact Sheet for Patients:  StrictlyIdeas.no Fact Sheet for Healthcare Providers: BankingDealers.co.za This test is  not yet approved or cleared by the [place] and has been authorized for detection and/or diagnosis of SARS-CoV-2 by FDA under an Emergency Use Authorization (EUA).  This EUA will remain in effect (meaning this test can be used) for the duration of the COVID-19 declaration under Section 564(b)(1) of the Act, 21 U.S.C. section 360bbb-3(b)(1), unless the authorization is terminated or revoked sooner. Performed at [facility], [place] [address], [place], [place] [address]   Urine culture     Status: Abnormal   Collection Time: 08/27/18  2:51 AM   Specimen: Urine, Clean Catch  Result Value Ref Range Status   Specimen Description URINE, CLEAN CATCH  Final   Special Requests NONE  Final   Culture (A)  Final    <10,000 COLONIES/mL INSIGNIFICANT GROWTH Performed at [facility], [place] [address], [place], [place] [address]    Report Status 08/28/2018 FINAL  Final     Labs: Basic Metabolic Panel: Recent Labs  Lab 08/26/18 1415 08/27/18 0224 08/28/18 0343  NA 152* 142 139  K 3.2* 3.1* 3.9  CL 115* 111 109   CO2 23 22 22   GLUCOSE 98 137* 120*  BUN 50* 41* 30*  CREATININE 1.14* 0.98 0.90  CALCIUM 9.9 8.3* 8.6*  MG  --  2.1  --    Liver Function Tests: Recent Labs  Lab 08/26/18 1415 08/27/18 0224 08/28/18 0343  AST 157* 99* 66*  ALT 91* 64* 58*  ALKPHOS 41 34* 39  BILITOT 1.1 1.1 0.6  PROT 7.5 5.9* 6.3*  ALBUMIN 3.3* 2.4* 2.7*   No results for input(s): LIPASE, AMYLASE in the last 168 hours. No results for input(s): AMMONIA in the last 168 hours. CBC: Recent Labs  Lab 08/26/18 1415 08/27/18 0224  WBC 10.4 10.1  NEUTROABS 7.5  --   HGB 11.6* 10.8*  HCT 36.9 33.5*  MCV 96.1 93.6  PLT 342 298   Cardiac Enzymes: Recent Labs  Lab 08/26/18 1415 08/27/18 0224 08/28/18 0343  CKTOTAL 2,014* 931* 459*   BNP: BNP (last 3 results) No results for input(s): BNP in the last 8760 hours.  ProBNP (last 3 results) No results for input(s): PROBNP in the last 8760 hours.  CBG: Recent Labs  Lab 08/26/18 1404  GLUCAP 85       Signed:  Radene Gunning NP [facility] 08/28/2018, 12:12 PM

## 2018-08-28 NOTE — Progress Notes (Signed)
Patient awaiting daughter for transportation. Patient has rolling walker to take with her. [place] set up.  Nsg Discharge Note  Admit Date:  08/26/2018 Discharge date: 08/28/2018   [name] [name] to be D/C'd Home per MD order.  AVS completed.  Copy for chart, and copy for patient signed, and dated. Patient/caregiver able to verbalize understanding.  Discharge Medication: Allergies as of 08/28/2018      Reactions   Codeine Nausea And Vomiting, Other (See Comments)   Dizziness      Medication List    STOP taking these medications   levofloxacin 750 MG tablet Commonly known as: Levaquin     TAKE these medications   aspirin EC 81 MG tablet Take 81 mg by mouth daily.   calcium-vitamin D 500-200 MG-UNIT tablet Commonly known as: OSCAL WITH D Take 1 tablet by mouth daily.   donepezil 10 MG tablet Commonly known as: ARICEPT Take 10 mg by mouth at bedtime.   fish oil-omega-3 fatty acids 1000 MG capsule Take 1 g by mouth daily.   folic acid 1 MG tablet Commonly known as: FOLVITE Take 1 mg by mouth daily.   hydrochlorothiazide 25 MG tablet Commonly known as: HYDRODIURIL Take 25 mg by mouth daily.   lovastatin 40 MG tablet Commonly known as: MEVACOR Take 40 mg by mouth daily.   sertraline 100 MG tablet Commonly known as: ZOLOFT Take 100 mg by mouth daily.            Durable Medical Equipment  (From admission, onward)         Start     Ordered   08/28/18 1207  For home use only DME Walker rolling  Once    Question:  Patient needs a walker to treat with the following condition  Answer:  Physical deconditioning   08/28/18 1207          Discharge Assessment: Vitals:   08/28/18 0525 08/28/18 1413  BP: (!) 150/70 (!) 144/66  Pulse: (!) 51 63  Resp: 16 20  Temp: (!) 97.4 F (36.3 C) 98.7 F (37.1 C)  SpO2: 100% 100%   Skin clean, dry and intact without evidence of skin break down, no evidence of skin tears noted. IV catheter discontinued intact. Site without signs  and symptoms of complications - no redness or edema noted at insertion site, patient denies c/o pain - only slight tenderness at site.  Dressing with slight pressure applied.  D/c Instructions-Education: Discharge instructions given to patient/family with verbalized understanding. D/c education completed with patient/family including follow up instructions, medication list, d/c activities limitations if indicated, with other d/c instructions as indicated by MD - patient able to verbalize understanding, all questions fully answered. Patient instructed to return to ED, call 911, or call MD for any changes in condition.  Patient escorted via [place], and D/C home via private auto.  Erasmo Leventhal, RN 08/28/2018 4:31 PM

## 2018-08-31 DIAGNOSIS — I10 Essential (primary) hypertension: Secondary | ICD-10-CM | POA: Diagnosis not present

## 2018-08-31 DIAGNOSIS — I350 Nonrheumatic aortic (valve) stenosis: Secondary | ICD-10-CM | POA: Diagnosis not present

## 2018-08-31 DIAGNOSIS — Z8701 Personal history of pneumonia (recurrent): Secondary | ICD-10-CM | POA: Diagnosis not present

## 2018-08-31 DIAGNOSIS — Z9181 History of falling: Secondary | ICD-10-CM | POA: Diagnosis not present

## 2018-08-31 DIAGNOSIS — Z7982 Long term (current) use of aspirin: Secondary | ICD-10-CM | POA: Diagnosis not present

## 2018-08-31 DIAGNOSIS — L89311 Pressure ulcer of right buttock, stage 1: Secondary | ICD-10-CM | POA: Diagnosis not present

## 2018-08-31 DIAGNOSIS — I4891 Unspecified atrial fibrillation: Secondary | ICD-10-CM | POA: Diagnosis not present

## 2018-08-31 DIAGNOSIS — L89321 Pressure ulcer of left buttock, stage 1: Secondary | ICD-10-CM | POA: Diagnosis not present

## 2018-08-31 DIAGNOSIS — N289 Disorder of kidney and ureter, unspecified: Secondary | ICD-10-CM | POA: Diagnosis not present

## 2018-08-31 DIAGNOSIS — E785 Hyperlipidemia, unspecified: Secondary | ICD-10-CM | POA: Diagnosis not present

## 2018-08-31 DIAGNOSIS — D649 Anemia, unspecified: Secondary | ICD-10-CM | POA: Diagnosis not present

## 2018-08-31 DIAGNOSIS — F0390 Unspecified dementia without behavioral disturbance: Secondary | ICD-10-CM | POA: Diagnosis not present

## 2018-08-31 DIAGNOSIS — M06041 Rheumatoid arthritis without rheumatoid factor, right hand: Secondary | ICD-10-CM | POA: Diagnosis not present

## 2018-09-02 ENCOUNTER — Other Ambulatory Visit: Payer: Self-pay

## 2018-09-02 ENCOUNTER — Ambulatory Visit (INDEPENDENT_AMBULATORY_CARE_PROVIDER_SITE_OTHER): Payer: PPO | Admitting: Podiatry

## 2018-09-02 ENCOUNTER — Encounter: Payer: Self-pay | Admitting: Podiatry

## 2018-09-02 VITALS — Temp 97.8°F

## 2018-09-02 DIAGNOSIS — Q828 Other specified congenital malformations of skin: Secondary | ICD-10-CM

## 2018-09-02 DIAGNOSIS — M79674 Pain in right toe(s): Secondary | ICD-10-CM | POA: Diagnosis not present

## 2018-09-02 DIAGNOSIS — M79675 Pain in left toe(s): Secondary | ICD-10-CM

## 2018-09-02 DIAGNOSIS — L84 Corns and callosities: Secondary | ICD-10-CM | POA: Diagnosis not present

## 2018-09-02 DIAGNOSIS — B351 Tinea unguium: Secondary | ICD-10-CM

## 2018-09-02 DIAGNOSIS — M19071 Primary osteoarthritis, right ankle and foot: Secondary | ICD-10-CM

## 2018-09-02 HISTORY — DX: Other specified congenital malformations of skin: Q82.8

## 2018-09-02 NOTE — Progress Notes (Signed)
This patient presents the office for preventative foot care services.  She says that the nails have become long and thick and are painful walking wearing her shoes.  She also has developed painful callus on the bottom of her right foot and on the tip of her third toe.  These calluses are painful walking and wearing her shoes.  She presents the office today with her daughter for an evaluation and treatment.  Patient states that she has had previous bunion surgery on both feet.   She presents for preventative foot care services.She presents to the office with her daughter.  Vascular  Dorsalis pedis and posterior tibial  pulses are palpable  bilaterally.  Capillary return is within normal limits  bilaterally. Temperature is within normal limits  bilaterally.  Neurologic  Senn-Weinstein monofilament wire test within normal limits  bilaterally. Muscle power within normal limits bilaterally.  Nails Thick disfigured discolored nails with subungual debris  from hallux to fifth toes bilaterally. No evidence of bacterial infection or drainage bilaterally.  Orthopedic  No limitations of motion  feet .  No crepitus or effusions noted.  No bony pathology or digital deformities noted.Contracted digits  B/L.  HAV with limited ROM  B/L.  Skin  normotropic skin noted bilaterally.  No signs of infections or ulcers noted.  Porokeratosis sub 1 right foot.  Hemmorrhagic pre-ulcerous lesions hallux distally  B/L.  Onychomycosis  X 10.  Debride callus  B/l.    Debride nails  X 10.  Debride callus right foot.  RTC 3 months.   Gardiner Barefoot DPM

## 2018-09-03 DIAGNOSIS — E785 Hyperlipidemia, unspecified: Secondary | ICD-10-CM | POA: Diagnosis not present

## 2018-09-03 DIAGNOSIS — L22 Diaper dermatitis: Secondary | ICD-10-CM | POA: Diagnosis not present

## 2018-09-03 DIAGNOSIS — N289 Disorder of kidney and ureter, unspecified: Secondary | ICD-10-CM | POA: Diagnosis not present

## 2018-09-03 DIAGNOSIS — Z7189 Other specified counseling: Secondary | ICD-10-CM | POA: Diagnosis not present

## 2018-09-03 DIAGNOSIS — R32 Unspecified urinary incontinence: Secondary | ICD-10-CM | POA: Diagnosis not present

## 2018-09-03 DIAGNOSIS — I10 Essential (primary) hypertension: Secondary | ICD-10-CM | POA: Diagnosis not present

## 2018-09-03 DIAGNOSIS — R945 Abnormal results of liver function studies: Secondary | ICD-10-CM | POA: Diagnosis not present

## 2018-09-03 DIAGNOSIS — M6282 Rhabdomyolysis: Secondary | ICD-10-CM | POA: Diagnosis not present

## 2018-09-08 DIAGNOSIS — R2681 Unsteadiness on feet: Secondary | ICD-10-CM | POA: Diagnosis not present

## 2018-09-17 DIAGNOSIS — L89311 Pressure ulcer of right buttock, stage 1: Secondary | ICD-10-CM | POA: Diagnosis not present

## 2018-09-17 DIAGNOSIS — I10 Essential (primary) hypertension: Secondary | ICD-10-CM | POA: Diagnosis not present

## 2018-09-17 DIAGNOSIS — F0390 Unspecified dementia without behavioral disturbance: Secondary | ICD-10-CM | POA: Diagnosis not present

## 2018-09-17 DIAGNOSIS — L89321 Pressure ulcer of left buttock, stage 1: Secondary | ICD-10-CM | POA: Diagnosis not present

## 2018-09-22 DIAGNOSIS — I4891 Unspecified atrial fibrillation: Secondary | ICD-10-CM | POA: Diagnosis not present

## 2018-09-22 DIAGNOSIS — I350 Nonrheumatic aortic (valve) stenosis: Secondary | ICD-10-CM | POA: Diagnosis not present

## 2018-09-22 DIAGNOSIS — E785 Hyperlipidemia, unspecified: Secondary | ICD-10-CM | POA: Diagnosis not present

## 2018-09-22 DIAGNOSIS — Z7982 Long term (current) use of aspirin: Secondary | ICD-10-CM | POA: Diagnosis not present

## 2018-09-22 DIAGNOSIS — Z9181 History of falling: Secondary | ICD-10-CM | POA: Diagnosis not present

## 2018-09-22 DIAGNOSIS — I10 Essential (primary) hypertension: Secondary | ICD-10-CM | POA: Diagnosis not present

## 2018-09-22 DIAGNOSIS — Z8701 Personal history of pneumonia (recurrent): Secondary | ICD-10-CM | POA: Diagnosis not present

## 2018-09-22 DIAGNOSIS — F0390 Unspecified dementia without behavioral disturbance: Secondary | ICD-10-CM | POA: Diagnosis not present

## 2018-09-22 DIAGNOSIS — N289 Disorder of kidney and ureter, unspecified: Secondary | ICD-10-CM | POA: Diagnosis not present

## 2018-09-22 DIAGNOSIS — M06041 Rheumatoid arthritis without rheumatoid factor, right hand: Secondary | ICD-10-CM | POA: Diagnosis not present

## 2018-09-22 DIAGNOSIS — L89311 Pressure ulcer of right buttock, stage 1: Secondary | ICD-10-CM | POA: Diagnosis not present

## 2018-09-22 DIAGNOSIS — D649 Anemia, unspecified: Secondary | ICD-10-CM | POA: Diagnosis not present

## 2018-09-22 DIAGNOSIS — L89321 Pressure ulcer of left buttock, stage 1: Secondary | ICD-10-CM | POA: Diagnosis not present

## 2018-09-30 ENCOUNTER — Encounter: Payer: Self-pay | Admitting: Cardiovascular Disease

## 2018-09-30 ENCOUNTER — Ambulatory Visit (INDEPENDENT_AMBULATORY_CARE_PROVIDER_SITE_OTHER): Payer: PPO | Admitting: Cardiovascular Disease

## 2018-09-30 ENCOUNTER — Other Ambulatory Visit: Payer: Self-pay

## 2018-09-30 DIAGNOSIS — I480 Paroxysmal atrial fibrillation: Secondary | ICD-10-CM | POA: Diagnosis not present

## 2018-09-30 DIAGNOSIS — I350 Nonrheumatic aortic (valve) stenosis: Secondary | ICD-10-CM | POA: Diagnosis not present

## 2018-09-30 DIAGNOSIS — I10 Essential (primary) hypertension: Secondary | ICD-10-CM | POA: Diagnosis not present

## 2018-09-30 DIAGNOSIS — E782 Mixed hyperlipidemia: Secondary | ICD-10-CM

## 2018-09-30 NOTE — Assessment & Plan Note (Signed)
History of essential hypertension with blood pressure measured at 138/62.  She is on hydrochlorothiazide.

## 2018-09-30 NOTE — Assessment & Plan Note (Signed)
History of moderate moderate aortic stenosis with recent 2D echo performed 08/26/2018 revealing normal LV systolic function, diastolic dysfunction and a valve area of 1.55 cm.  She has no significant symptoms of AS

## 2018-09-30 NOTE — Assessment & Plan Note (Signed)
History of hyperlipidemia on statin therapy with lipid profile performed 06/23/2018 revealing total cholesterol 178, LDL 92 and HDL 77

## 2018-09-30 NOTE — Patient Instructions (Signed)
Medication Instructions:  Your physician recommends that you continue on your current medications as directed. Please refer to the Current Medication list given to you today.  If you need a refill on your cardiac medications before your next appointment, please call your pharmacy.   Lab work: NONE If you have labs (blood work) drawn today and your tests are completely normal, you will receive your results only by: Marland Kitchen MyChart Message (if you have MyChart) OR . A paper copy in the mail If you have any lab test that is abnormal or we need to change your treatment, we will call you to review the results.  Testing/Procedures: NONE  Follow-Up: At [facility], you and your health needs are our priority.  As part of our continuing mission to provide you with exceptional heart care, we have created designated Provider Care Teams.  These Care Teams include your primary Cardiologist (physician) and Advanced Practice Providers (APPs -  Physician Assistants and Nurse Practitioners) who all work together to provide you with the care you need, when you need it. . You will need a follow up appointment AS NEEDED. You may see Dr. Gwenlyn Found or one of the following Advanced Practice Providers on your designated Care Team:   . Kerin Ransom, [place] . Almyra Deforest, PA-C . Fabian Sharp, PA-C . Jory Sims, DNP . Rosaria Ferries, PA-C . Roby Lofts, PA-C

## 2018-09-30 NOTE — Progress Notes (Signed)
09/30/2018 Chonita Gadea   1928-04-29  [id number]  Primary Physician Chryl Heck, Rama (Merrilee Seashore), MD (Inactive) Primary Cardiologist: Lorretta Harp MD Renae Gloss  HPI:  [name] [name] is a 85 y.o. moderately overweight divorced African-American female mother of 9, grandmother of 80 grandchildren is accompanied by her daughter [name] today.  She was referred by the hospitalist during her recent hospitalization for an episode of PAF.  She has seen Dr. Sallyanne Kuster O remotely back in 2014.  She has a history of treated hypertension, and hyperlipidemia as well as moderate aortic stenosis which has not progressed in the last 6 years by recent 2D echo.  She apparently fell in her bathroom at home and was on the ground for 6 days before coming to the hospital.  She was dehydrated and was in rhabdomyolysis.  She had a brief episode of PAF and was found on telemetry but otherwise had sinus rhythm with PACs   Current Meds  Medication Sig  . aspirin EC 81 MG tablet Take 81 mg by mouth daily.  . calcium-vitamin D (OSCAL WITH D) 500-200 MG-UNIT per tablet Take 1 tablet by mouth daily.  Marland Kitchen donepezil (ARICEPT) 10 MG tablet Take 10 mg by mouth at bedtime.  . fish oil-omega-3 fatty acids 1000 MG capsule Take 1 g by mouth daily.  . folic acid (FOLVITE) 1 MG tablet Take 1 mg by mouth daily.  . hydrochlorothiazide (HYDRODIURIL) 25 MG tablet Take 25 mg by mouth daily.  Marland Kitchen lovastatin (MEVACOR) 40 MG tablet Take 40 mg by mouth daily.  . sertraline (ZOLOFT) 100 MG tablet Take 100 mg by mouth daily.      Allergies  Allergen Reactions  . Codeine Nausea And Vomiting and Other (See Comments)    Dizziness    Social History   Socioeconomic History  . Marital status: Divorced    Spouse name: Not on file  . Number of children: Not on file  . Years of education: Not on file  . Highest education level: Not on file  Occupational History  . Not on file  Social Needs  . Financial resource strain:  Not on file  . Food insecurity    Worry: Not on file    Inability: Not on file  . Transportation needs    Medical: Not on file    Non-medical: Not on file  Tobacco Use  . Smoking status: Never Smoker  . Smokeless tobacco: Never Used  Substance and Sexual Activity  . Alcohol use: No  . Drug use: No  . Sexual activity: Never  Lifestyle  . Physical activity    Days per week: Not on file    Minutes per session: Not on file  . Stress: Not on file  Relationships  . Social Herbalist on phone: Not on file    Gets together: Not on file    Attends religious service: Not on file    Active member of club or organization: Not on file    Attends meetings of clubs or organizations: Not on file    Relationship status: Not on file  . Intimate partner violence    Fear of current or ex partner: Not on file    Emotionally abused: Not on file    Physically abused: Not on file    Forced sexual activity: Not on file  Other Topics Concern  . Not on file  Social History Narrative  . Not on file  Review of Systems: General: negative for chills, fever, night sweats or weight changes.  Cardiovascular: negative for chest pain, dyspnea on exertion, edema, orthopnea, palpitations, paroxysmal nocturnal dyspnea or shortness of breath Dermatological: negative for rash Respiratory: negative for cough or wheezing Urologic: negative for hematuria Abdominal: negative for nausea, vomiting, diarrhea, bright red blood per rectum, melena, or hematemesis Neurologic: negative for visual changes, syncope, or dizziness All other systems reviewed and are otherwise negative except as noted above.    Blood pressure 138/62, pulse 72, temperature (!) 97.3 F (36.3 C), height 5\' 6"  (1.676 m), weight 187 lb (84.8 kg).  General appearance: alert and no distress Neck: no adenopathy, no carotid bruit, no JVD, supple, symmetrical, trachea midline and thyroid not enlarged, symmetric, no  tenderness/mass/nodules Lungs: clear to auscultation bilaterally Heart: regular rate and rhythm, S1, S2 normal, no murmur, click, rub or gallop Extremities: extremities normal, atraumatic, no cyanosis or edema Pulses: 2+ and symmetric Skin: Skin color, texture, turgor normal. No rashes or lesions Neurologic: Alert and oriented X 3, normal strength and tone. Normal symmetric reflexes. Normal coordination and gait  EKG not performed today  ASSESSMENT AND PLAN:   HTN (hypertension) History of essential hypertension with blood pressure measured at 138/62.  She is on hydrochlorothiazide.  Hyperlipidemia History of hyperlipidemia on statin therapy with lipid profile performed 06/23/2018 revealing total cholesterol 178, LDL 92 and HDL 77  Aortic stenosis History of moderate moderate aortic stenosis with recent 2D echo performed 08/26/2018 revealing normal LV systolic function, diastolic dysfunction and a valve area of 1.55 cm.  She has no significant symptoms of AS  Atrial fibrillation (HCC) History of a recent episode of PAF during a 2-day hospitalization 08/26/2018 in the setting of dehydration and rhabdomyolysis.  She has had no recurrence.  I do not think this necessitates oral anticoagulation.      Lorretta Harp MD FACP,FACC,FAHA, [facility] 09/30/2018 12:13 PM

## 2018-09-30 NOTE — Assessment & Plan Note (Signed)
History of a recent episode of PAF during a 2-day hospitalization 08/26/2018 in the setting of dehydration and rhabdomyolysis.  She has had no recurrence.  I do not think this necessitates oral anticoagulation.

## 2018-10-01 DIAGNOSIS — R350 Frequency of micturition: Secondary | ICD-10-CM | POA: Diagnosis not present

## 2018-10-01 DIAGNOSIS — N3941 Urge incontinence: Secondary | ICD-10-CM | POA: Diagnosis not present

## 2018-10-01 DIAGNOSIS — R351 Nocturia: Secondary | ICD-10-CM | POA: Diagnosis not present

## 2018-10-14 DIAGNOSIS — N3944 Nocturnal enuresis: Secondary | ICD-10-CM | POA: Diagnosis not present

## 2018-10-14 DIAGNOSIS — R350 Frequency of micturition: Secondary | ICD-10-CM | POA: Diagnosis not present

## 2018-10-14 DIAGNOSIS — Z23 Encounter for immunization: Secondary | ICD-10-CM | POA: Diagnosis not present

## 2018-10-14 DIAGNOSIS — N3941 Urge incontinence: Secondary | ICD-10-CM | POA: Diagnosis not present

## 2018-10-14 DIAGNOSIS — L84 Corns and callosities: Secondary | ICD-10-CM | POA: Diagnosis not present

## 2018-10-14 DIAGNOSIS — R0989 Other specified symptoms and signs involving the circulatory and respiratory systems: Secondary | ICD-10-CM | POA: Diagnosis not present

## 2018-10-14 DIAGNOSIS — F0390 Unspecified dementia without behavioral disturbance: Secondary | ICD-10-CM | POA: Diagnosis not present

## 2018-10-14 DIAGNOSIS — Z7189 Other specified counseling: Secondary | ICD-10-CM | POA: Diagnosis not present

## 2018-10-14 DIAGNOSIS — N139 Obstructive and reflux uropathy, unspecified: Secondary | ICD-10-CM | POA: Diagnosis not present

## 2018-10-29 DIAGNOSIS — R0989 Other specified symptoms and signs involving the circulatory and respiratory systems: Secondary | ICD-10-CM | POA: Diagnosis not present

## 2018-10-29 DIAGNOSIS — I739 Peripheral vascular disease, unspecified: Secondary | ICD-10-CM | POA: Diagnosis not present

## 2018-11-05 ENCOUNTER — Encounter: Payer: Self-pay | Admitting: Podiatry

## 2018-11-05 ENCOUNTER — Ambulatory Visit (INDEPENDENT_AMBULATORY_CARE_PROVIDER_SITE_OTHER): Payer: PPO | Admitting: Podiatry

## 2018-11-05 ENCOUNTER — Other Ambulatory Visit: Payer: Self-pay

## 2018-11-05 DIAGNOSIS — L84 Corns and callosities: Secondary | ICD-10-CM

## 2018-11-05 DIAGNOSIS — B351 Tinea unguium: Secondary | ICD-10-CM | POA: Diagnosis not present

## 2018-11-05 NOTE — Progress Notes (Signed)
This patient presents the office for preventative foot care services.  She says that the nails have become long and thick and are painful walking wearing her shoes.  She also has developed painful callus on the bottom of her right foot .    This  callus is  painful walking and wearing her shoes.  She presents the office today with her daughter for an evaluation and treatment.  Patient states that she has had previous bunion surgery on both feet.   She presents for preventative foot care services.    Vascular  Dorsalis pedis  are palpable  Bilaterally. Posterior tibial pulses are weak/absent  B/L.  Capillary return is within normal limits  bilaterally. Temperature is within normal limits  bilaterally.  Neurologic  Senn-Weinstein monofilament wire test within normal limits  bilaterally. Muscle power within normal limits bilaterally.  Nails Thick disfigured discolored nails with subungual debris  from hallux to fifth toes bilaterally. No evidence of bacterial infection or drainage bilaterally.  Orthopedic  No limitations of motion  feet .  No crepitus or effusions noted.  No bony pathology or digital deformities noted.Contracted digits  B/L.  HAV with limited ROM  B/L.  Skin  normotropic skin noted bilaterally.  No signs of infections or ulcers noted.  Hemorrhagic callus sub 1 right foot.  Onychomycosis  X 10.  Debride callus  B/l.    Debride nails  X 10.  Debride callus right foot.  RTC 3 months.   Gardiner Barefoot DPM

## 2018-11-06 DIAGNOSIS — I739 Peripheral vascular disease, unspecified: Secondary | ICD-10-CM | POA: Diagnosis not present

## 2018-11-06 DIAGNOSIS — Z7189 Other specified counseling: Secondary | ICD-10-CM | POA: Diagnosis not present

## 2018-12-05 ENCOUNTER — Ambulatory Visit: Payer: PPO | Admitting: Podiatry

## 2018-12-09 DIAGNOSIS — I10 Essential (primary) hypertension: Secondary | ICD-10-CM | POA: Diagnosis not present

## 2018-12-09 DIAGNOSIS — Z7189 Other specified counseling: Secondary | ICD-10-CM | POA: Diagnosis not present

## 2018-12-09 DIAGNOSIS — E782 Mixed hyperlipidemia: Secondary | ICD-10-CM | POA: Diagnosis not present

## 2018-12-09 DIAGNOSIS — Z0000 Encounter for general adult medical examination without abnormal findings: Secondary | ICD-10-CM | POA: Diagnosis not present

## 2018-12-09 DIAGNOSIS — R7301 Impaired fasting glucose: Secondary | ICD-10-CM | POA: Diagnosis not present

## 2018-12-15 NOTE — Progress Notes (Signed)
[facility]    Provider:  Dr Jaynee Eagles Requesting Provider: Merrilee Seashore, MD Primary Care Provider:  Merrilee Seashore, MD  CC:  Dementia  HPI:  [name] [name] is a 85 y.o. female here as requested by Merrilee Seashore, MD for dementia.  Past medical history "senile dementia, without behavioral disturbances". PMHx afib, HTN, Rhabdo, RA, renal insufficiency. Here with her daughter who provides most information. Patient lives alone. She had fallen and in her apartment for 6 days, unsure why, they found her in [place] and some sores.Her sister has dementia, patient denies she has dementia. Daughter noticed changes 3-4 years ago, slowly progressive, patient is irritable, agitated, patient blames daughter, she accused daughter of taking her blow dryer (it was found), she loses things, started with short-term memory loss, losing things, repeating the same stories over and over, forgetting appointments and losing hr wallet and other items. She takes her own medications. Not missing any bills, paying all the bills. Doctor stopped her from driving. She is on Aricept. She is weaker due to the fall. She has to walk with a cane. They used tohave a nurse come in from insurance. She declined when leaving the hospital going to a facility, they tried.   Reviewed notes, labs and imaging from outside physicians, which showed:  I reviewed Dr. Rockne Menghini Chandra's notes.  Apparently a CT scan done in the hospital was abnormal and doctor suggested that she see a neurologist.  The last time Dr. Loreen Freud saw her was in August at which time he was ruling out vascular causes for venous hyperpigmentation and hyperkeratotic lesions of the tips of her big toes bilaterally, more likely a callus than lesion caused by vascular insufficiency, she has been diagnosed with senile dementia in the past without behavioral disturbance, he is recommended that she not drive because of the degree of her dementia.  She is  on aspirin 81 mg, sertraline, donepezil.  Examination showed a normal, alert, general appearance, extraocular movements intact, neck thyroid normal, heart with normal rate rhythm S1-S2 normal, lungs good air movement, chest normal, abdomen normal, nonfocal neurologic exam cranial nerves II through XII grossly intact, alert and oriented, Babinski absent.  She is incontinent and uses a adult diaper, she is also had nontraumatic rhabdomyolysis, abnormal liver function tests, renal insufficiency, hyperlipidemia.  I reviewed CT of the head completed July 2020 after patient being found down for several days with resultant rhabdomyolysis and renal insufficiency, CT of the head showed mild chronic microvascular ischemic changes, atrophy, no acute findings.  I could not review the images, I reviewed the report.   TSH nml 08/2018   Review of Systems: Patient complains of symptoms per HPI as well as the following symptoms: joint pain, swelling, murmur. Pertinent negatives and positives per HPI. All others negative.   Social History   Socioeconomic History  . Marital status: Divorced    Spouse name: Not on file  . Number of children: 4  . Years of education: 18  . Highest education level: Some college, no degree  Occupational History  . Not on file  Social Needs  . Financial resource strain: Not on file  . Food insecurity    Worry: Not on file    Inability: Not on file  . Transportation needs    Medical: Not on file    Non-medical: Not on file  Tobacco Use  . Smoking status: Never Smoker  . Smokeless tobacco: Never Used  Substance and Sexual Activity  . Alcohol use:  No  . Drug use: No  . Sexual activity: Never  Lifestyle  . Physical activity    Days per week: Not on file    Minutes per session: Not on file  . Stress: Not on file  Relationships  . Social Herbalist on phone: Not on file    Gets together: Not on file    Attends religious service: Not on file    Active member  of club or organization: Not on file    Attends meetings of clubs or organizations: Not on file    Relationship status: Not on file  . Intimate partner violence    Fear of current or ex partner: Not on file    Emotionally abused: Not on file    Physically abused: Not on file    Forced sexual activity: Not on file  Other Topics Concern  . Not on file  Social History Narrative   Lives at home alone   Right handed   Caffeine: sometimes    Family History  Problem Relation Age of Onset  . Diabetes Father   . Seizures Brother   . Alzheimer's disease Sister   . Cancer Sister   . Breast cancer Sister   . Cancer Sister   . Seizures Daughter   . Seizures Son   . Seizures Granddaughter   . Ovarian cancer Granddaughter   . Lung cancer Nephew   . Breast cancer Niece   . Breast cancer Niece   . Brain cancer Niece     Past Medical History:  Diagnosis Date  . Aortic stenosis   . Atrial fibrillation ([place])   . Chest pain    myoview 04/24/07-mild-mod perfusion defect with mild-mod superimposed ischemiain the mid inferior, apicla inferior, basal inferolateral and mid inferolateral regions  . Dementia ([place])   . Depression   . Edema   . Gallstone   . Generalized osteoarthritis   . HTN (hypertension)   . Hyperlipidemia   . IFG (impaired fasting glucose)   . Nonspecific abnormal electrocardiogram (ECG) (EKG)   . Pneumonia    "just now" (01/13/2013)  . Rheumatoid arthritis of hand ([place])   . Vertigo    as late effect of cerebrovascular disease    Patient Active Problem List   Diagnosis Date Noted  . Dementia with behavioral disturbance ([place]) 12/16/2018  . Porokeratosis 09/02/2018  . Corns and callosities 09/02/2018  . Elevated CK   . Pressure injury of skin 08/27/2018  . Atrial fibrillation ([place]) 08/27/2018  . Hypernatremia   . Rhabdomyolysis 08/26/2018  . Normocytic anemia 08/26/2018  . Renal insufficiency 08/26/2018  . Community acquired pneumonia 01/13/2013  . Sepsis ([place])  01/13/2013  . CAP (community acquired pneumonia) 01/13/2013  . Hyponatremia 01/13/2013  . Hypochloremia 01/13/2013  . Rheumatoid arthritis ([place]) 01/13/2013  . Premature atrial contractions 07/22/2012  . Hyperlipidemia 07/22/2012  . Carotid artery bruit 07/22/2012  . Aortic stenosis 07/22/2012  . Chest pain   . HTN (hypertension)     Past Surgical History:  Procedure Laterality Date  . APPENDECTOMY    . BUNIONECTOMY Bilateral   . cataract surgery Bilateral 05/2016  . gallstones removal    . TONSILLECTOMY      Current Outpatient Medications  Medication Sig Dispense Refill  . aspirin EC 81 MG tablet Take 81 mg by mouth daily.    . calcium-vitamin D (OSCAL WITH D) 500-200 MG-UNIT per tablet Take 1 tablet by mouth daily.    Marland Kitchen donepezil (ARICEPT)  10 MG tablet Take 10 mg by mouth at bedtime.  4  . hydrochlorothiazide (HYDRODIURIL) 25 MG tablet Take 25 mg by mouth daily.    . mirabegron ER (MYRBETRIQ) 25 MG TB24 tablet Take 25 mg by mouth daily.    . rosuvastatin (CRESTOR) 20 MG tablet Take 20 mg by mouth daily.    . sertraline (ZOLOFT) 100 MG tablet Take 100 mg by mouth daily.   3   No current facility-administered medications for this visit.     Allergies as of 12/16/2018 - Review Complete 12/16/2018  Allergen Reaction Noted  . Codeine Nausea And Vomiting and Other (See Comments) 07/13/2012    Vitals: BP (!) 162/78 (BP Location: Right Arm, Patient Position: Sitting)   Pulse 60   Temp 97.6 F (36.4 C) Comment: daughter 97.8, both taken at front door  Ht 5\' 6"  (1.676 m)   Wt 179 lb (81.2 kg)   BMI 28.89 kg/m  Last Weight:  Wt Readings from Last 1 Encounters:  12/16/18 179 lb (81.2 kg)   Last Height:   Ht Readings from Last 1 Encounters:  12/16/18 5\' 6"  (1.676 m)     Physical exam: Exam: Gen: NAD, not conversant           CV: RRR. No peripheral edema, warm, nontender Eyes: Conjunctivae clear without exudates or hemorrhage  Neuro: Detailed Neurologic Exam   Speech:    Speech is normal; fluent and spontaneous with normal comprehension.  Cognition:   MMSE - Mini Mental State Exam 12/16/2018  Orientation to time 5  Orientation to Place 4  Registration 3  Attention/ Calculation 5  Recall 2  Language- name 2 objects 2  Language- repeat 1  Language- follow 3 step command 3  Language- read & follow direction 1  Write a sentence 1  Copy design 1  Total score 28        The patient is oriented to person, place, and time;     recent and remote memory grossly intact;     language fluent;     Impaired attention, concentration, fund of knowledge Cranial Nerves:    The pupils are equal, round, and reactive to light. Attempted fundoscopy, pupils too small . Visual fields are full to finger confrontation. Extraocular movements are intact. Trigeminal sensation is intact and the muscles of mastication are normal. The face is symmetric. The palate elevates in the midline. Hearing intact. Voice is normal. Shoulder shrug is normal. The tongue has normal motion without fasciculations.   Coordination:    No dysmetria   Gait:    Not ataxic, not shuffling, uses a walking aid, slightly stooped  Motor Observation:    No asymmetry, no atrophy, and no involuntary movements noted. Tone:    Normal muscle tone.      Strength:    Strength is symmetrical and non focal in the upper and lower limbs.      Sensation: intact to LT     Reflex Exam:  DTR's:    Deep tendon reflexes in the upper and lower extremities are symmetrical bilaterally.   Toes:    The toes are withdrawal bilaterally.   Clonus:    Clonus is absent.    Assessment/Plan:  85 y.o. female here as requested by Merrilee Seashore, MD for dementia.  Past medical history "senile dementia, without behavioral disturbances". PMHx afib, HTN, Rhabdo, RA, renal insufficiency. Here with her daughter who provides most information. MMSE is 28/30, technically normal however depends on prior levels of  intelligence, education.  - Patient lives alone, recent fall and rhabdo, I do not think it is safe for patient to live at home alone recommend assisted living, discussed with both - Needs a thorough eval for reversible causes of dementia - continue Donepezil  Orders Placed This Encounter  Procedures  . MR BRAIN W WO CONTRAST  . CBC with Differential/Platelets  . Comprehensive metabolic panel  . Homocysteine  . B12 and Folate Panel  . Methylmalonic acid, serum  . Vitamin B1  . Ambulatory referral to Neuropsychology    Cc: Merrilee Seashore, MD  Sarina Ill, MD  [facility] [address] [place], [place] [address]  Phone [phone] Fax [phone]

## 2018-12-16 ENCOUNTER — Encounter: Payer: Self-pay | Admitting: Neurology

## 2018-12-16 ENCOUNTER — Other Ambulatory Visit: Payer: Self-pay

## 2018-12-16 ENCOUNTER — Telehealth: Payer: Self-pay | Admitting: Neurology

## 2018-12-16 ENCOUNTER — Ambulatory Visit: Payer: PPO | Admitting: Neurology

## 2018-12-16 VITALS — BP 162/78 | HR 60 | Temp 97.6°F | Ht 66.0 in | Wt 179.0 lb

## 2018-12-16 DIAGNOSIS — M059 Rheumatoid arthritis with rheumatoid factor, unspecified: Secondary | ICD-10-CM | POA: Diagnosis not present

## 2018-12-16 DIAGNOSIS — F0391 Unspecified dementia with behavioral disturbance: Secondary | ICD-10-CM

## 2018-12-16 DIAGNOSIS — Z79899 Other long term (current) drug therapy: Secondary | ICD-10-CM

## 2018-12-16 DIAGNOSIS — I739 Peripheral vascular disease, unspecified: Secondary | ICD-10-CM | POA: Diagnosis not present

## 2018-12-16 DIAGNOSIS — F0390 Unspecified dementia without behavioral disturbance: Secondary | ICD-10-CM | POA: Diagnosis not present

## 2018-12-16 DIAGNOSIS — E039 Hypothyroidism, unspecified: Secondary | ICD-10-CM | POA: Diagnosis not present

## 2018-12-16 DIAGNOSIS — E782 Mixed hyperlipidemia: Secondary | ICD-10-CM | POA: Diagnosis not present

## 2018-12-16 DIAGNOSIS — R410 Disorientation, unspecified: Secondary | ICD-10-CM | POA: Diagnosis not present

## 2018-12-16 DIAGNOSIS — I10 Essential (primary) hypertension: Secondary | ICD-10-CM | POA: Diagnosis not present

## 2018-12-16 DIAGNOSIS — Z7189 Other specified counseling: Secondary | ICD-10-CM | POA: Diagnosis not present

## 2018-12-16 DIAGNOSIS — Z0000 Encounter for general adult medical examination without abnormal findings: Secondary | ICD-10-CM | POA: Diagnosis not present

## 2018-12-16 DIAGNOSIS — G309 Alzheimer's disease, unspecified: Secondary | ICD-10-CM

## 2018-12-16 DIAGNOSIS — R7301 Impaired fasting glucose: Secondary | ICD-10-CM | POA: Diagnosis not present

## 2018-12-16 DIAGNOSIS — I350 Nonrheumatic aortic (valve) stenosis: Secondary | ICD-10-CM | POA: Diagnosis not present

## 2018-12-16 NOTE — Patient Instructions (Signed)
MRI of the brain Blood work today Formal memory testing   Dementia Dementia is a condition that affects the way the brain functions. It often affects memory and thinking. Usually, dementia gets worse with time and cannot be reversed (progressive dementia). There are many types of dementia, including:  Alzheimer's disease. This type is the most common.  Vascular dementia. This type may happen as the result of a stroke.  Lewy body dementia. This type may happen to people who have Parkinson's disease.  Frontotemporal dementia. This type is caused by damage to nerve cells (neurons) in certain parts of the brain. Some people may be affected by more than one type of dementia. This is called mixed dementia. What are the causes? Dementia is caused by damage to cells in the brain. The area of the brain and the types of cells damaged determine the type of dementia. Usually, this damage is irreversible or cannot be undone. Some examples of irreversible causes include:  Conditions that affect the blood vessels of the brain, such as diabetes, heart disease, or blood vessel disease.  Genetic mutations. In some cases, changes in the brain may be caused by another condition and can be reversed or slowed. Some examples of reversible causes include:  Injury to the brain.  Certain medicines.  Infection, such as meningitis.  Metabolic problems, such as vitamin B12 deficiency or thyroid disease.  Pressure on the brain, such as from a tumor or blood clot. What are the signs or symptoms? Symptoms of dementia depend on the type of dementia. Common signs of dementia include problems with remembering, thinking, problem solving, decision making, and communicating. These signs develop slowly or get worse with time. This may include:  Problems remembering things.  Having trouble taking a bath or putting clothes on.  Forgetting appointments.  Forgetting to pay bills.  Difficulty planning and preparing  meals.  Having trouble speaking.  Getting lost easily. How is this diagnosed? This condition is diagnosed by a specialist (neurologist). It is diagnosed based on the history of your symptoms, your medical history, a physical exam, and tests. Tests may include:  Tests to evaluate brain function, such as memory tests, cognitive tests, and other tests.  Lab tests, such as blood or urine tests.  Imaging tests, such as a CT scan, a PET scan, or an MRI.  Genetic testing. This may be done if other family members have a diagnosis of certain types of dementia. Your health care provider will talk with you and your family, friends, or caregivers about your history and symptoms. How is this treated?  Treatment for this condition depends on the cause of the dementia. Progressive dementias, such as Alzheimer's disease, cannot be cured, but there may be treatments that help to manage symptoms. Treatment might involve taking medicines that may help to:  Control the dementia.  Slow down the progression of the dementia.  Manage symptoms. In some cases, treating the cause of your dementia can improve symptoms, reverse symptoms, or slow down how quickly your dementia becomes worse. Your health care provider can direct you to support groups, organizations, and other health care providers who can help with decisions about your care. Follow these instructions at home: Medicines  Take over-the-counter and prescription medicines only as told by your health care provider.  Use a pill organizer or pill reminder to help you manage your medicines.  Avoid taking medicines that can affect thinking, such as pain medicines or sleeping medicines. Lifestyle  Make healthy lifestyle choices. ?  Be physically active as told by your health care provider. ? Do not use any products that contain nicotine or tobacco, such as cigarettes, e-cigarettes, and chewing tobacco. If you need help quitting, ask your health care  provider. ? Do not drink alcohol. ? Practice stress-management techniques when you get stressed. ? Spend time with other people.  Make sure to get quality sleep. These tips can help you get a good night's rest: ? Avoid napping during the day. ? Keep your sleeping area dark and cool. ? Avoid exercising during the few hours before you go to bed. ? Avoid caffeine products in the evening. Eating and drinking  Drink enough fluid to keep your urine pale yellow.  Eat a healthy diet. General instructions   Work with your health care provider to determine what you need help with and what your safety needs are.  Talk with your health care provider about whether it is safe for you to drive.  If you were given a bracelet that identifies you as a person with memory loss or tracks your location, make sure to wear it at all times.  Work with your family to make important decisions, such as advance directives, medical power of attorney, or a living will.  Keep all follow-up visits as told by your health care provider. This is important. Where to find more information  Alzheimer's Association: CapitalMile.co.nz  National Institute on Aging: DVDEnthusiasts.nl  World Health Organization: RoleLink.com.br Contact a health care provider if:  You have any new or worsening symptoms.  You have problems with choking or swallowing. Get help right away if:  You feel depressed or sad, or feel that you want to harm yourself.  Your family members become concerned for your safety. If you ever feel like you may hurt yourself or others, or have thoughts about taking your own life, get help right away. You can go to your nearest emergency department or call:  Your local emergency services (911 in the [place]  A suicide crisis helpline, such as the [place] at 240-054-1625. This is open 24 hours a day. Summary  Dementia is a condition that affects the way the brain  functions. Dementia often affects memory and thinking.  Usually, dementia gets worse with time and cannot be reversed (progressive dementia).  Treatment for this condition depends on the cause of the dementia.  Work with your health care provider to determine what you need help with and what your safety needs are.  Your health care provider can direct you to support groups, organizations, and other health care providers who can help with decisions about your care. This information is not intended to replace advice given to you by your health care provider. Make sure you discuss any questions you have with your health care provider. Document Released: 08/01/2000 Document Revised: 04/22/2018 Document Reviewed: 04/22/2018 Elsevier Patient Education  2020 [employer].

## 2018-12-16 NOTE — Telephone Encounter (Signed)
health team order sent to GI. No auth they will reach out to the patient to schedule.  

## 2018-12-19 DIAGNOSIS — R351 Nocturia: Secondary | ICD-10-CM | POA: Diagnosis not present

## 2018-12-19 DIAGNOSIS — N3944 Nocturnal enuresis: Secondary | ICD-10-CM | POA: Diagnosis not present

## 2018-12-21 LAB — CBC WITH DIFFERENTIAL/PLATELET
Basophils Absolute: 0 10*3/uL (ref 0.0–0.2)
Basos: 0 %
EOS (ABSOLUTE): 0.2 10*3/uL (ref 0.0–0.4)
Eos: 3 %
Hematocrit: 33.2 % — ABNORMAL LOW (ref 34.0–46.6)
Hemoglobin: 10.6 g/dL — ABNORMAL LOW (ref 11.1–15.9)
Immature Grans (Abs): 0 10*3/uL (ref 0.0–0.1)
Immature Granulocytes: 0 %
Lymphocytes Absolute: 2 10*3/uL (ref 0.7–3.1)
Lymphs: 26 %
MCH: 29.2 pg (ref 26.6–33.0)
MCHC: 31.9 g/dL (ref 31.5–35.7)
MCV: 92 fL (ref 79–97)
Monocytes Absolute: 0.7 10*3/uL (ref 0.1–0.9)
Monocytes: 10 %
Neutrophils Absolute: 4.7 10*3/uL (ref 1.4–7.0)
Neutrophils: 61 %
Platelets: 282 10*3/uL (ref 150–450)
RBC: 3.63 x10E6/uL — ABNORMAL LOW (ref 3.77–5.28)
RDW: 13.8 % (ref 11.7–15.4)
WBC: 7.7 10*3/uL (ref 3.4–10.8)

## 2018-12-21 LAB — COMPREHENSIVE METABOLIC PANEL
ALT: 13 IU/L (ref 0–32)
AST: 21 IU/L (ref 0–40)
Albumin/Globulin Ratio: 1.3 (ref 1.2–2.2)
Albumin: 4.1 g/dL (ref 3.6–4.6)
Alkaline Phosphatase: 48 IU/L (ref 39–117)
BUN/Creatinine Ratio: 27 (ref 12–28)
BUN: 27 mg/dL (ref 8–27)
Bilirubin Total: 0.3 mg/dL (ref 0.0–1.2)
CO2: 25 mmol/L (ref 20–29)
Calcium: 9.8 mg/dL (ref 8.7–10.3)
Chloride: 101 mmol/L (ref 96–106)
Creatinine, Ser: 1 mg/dL (ref 0.57–1.00)
GFR calc Af Amer: 58 mL/min/{1.73_m2} — ABNORMAL LOW (ref >59)
GFR calc non Af Amer: 50 mL/min/{1.73_m2} — ABNORMAL LOW (ref >59)
Globulin, Total: 3.2 g/dL (ref 1.5–4.5)
Glucose: 99 mg/dL (ref 65–99)
Potassium: 3.6 mmol/L (ref 3.5–5.2)
Sodium: 139 mmol/L (ref 134–144)
Total Protein: 7.3 g/dL (ref 6.0–8.5)

## 2018-12-21 LAB — B12 AND FOLATE PANEL
Folate: 9 ng/mL (ref >3.0)
Vitamin B-12: 248 pg/mL (ref 232–1245)

## 2018-12-21 LAB — METHYLMALONIC ACID, SERUM: Methylmalonic Acid: 289 nmol/L (ref 0–378)

## 2018-12-21 LAB — HOMOCYSTEINE: Homocysteine: 18.4 umol/L (ref 0.0–21.3)

## 2018-12-21 LAB — VITAMIN B1: Thiamine: 91.6 nmol/L (ref 66.5–200.0)

## 2018-12-24 ENCOUNTER — Telehealth: Payer: Self-pay | Admitting: *Deleted

## 2018-12-24 NOTE — Telephone Encounter (Signed)
Called pt's main number on DPR and spoke with daughter Akita Maxim (on [place]). I discussed the lab results from Dr. Jaynee Eagles (anemia and some mild reduced kidney function which is chronic and stable, other labs normal). She verbalized understanding and appreciation. Her questions were answered.

## 2018-12-24 NOTE — Telephone Encounter (Signed)
-----   Message from Melvenia Beam, MD sent at 12/21/2018 10:48 PM EST ----- Labs are unremarkable, she has anemia and some mild reduced kidney function which is chronic and stable.Other labs are normal thanks

## 2019-01-10 ENCOUNTER — Other Ambulatory Visit: Payer: Self-pay

## 2019-01-10 ENCOUNTER — Ambulatory Visit
Admission: RE | Admit: 2019-01-10 | Discharge: 2019-01-10 | Disposition: A | Discharge status: Home / Self Care | Payer: PPO | Source: Ambulatory Visit | Attending: Neurology | Admitting: Neurology

## 2019-01-10 DIAGNOSIS — G309 Alzheimer's disease, unspecified: Secondary | ICD-10-CM

## 2019-01-10 DIAGNOSIS — F0391 Unspecified dementia with behavioral disturbance: Secondary | ICD-10-CM

## 2019-01-10 DIAGNOSIS — R410 Disorientation, unspecified: Secondary | ICD-10-CM

## 2019-01-10 MED ORDER — GADOBENATE DIMEGLUMINE 529 MG/ML IV SOLN
15.0000 mL | Freq: Once | INTRAVENOUS | Status: AC | PRN
  Administered 2019-01-10: 15 mL via INTRAVENOUS

## 2019-01-13 ENCOUNTER — Telehealth: Payer: Self-pay | Admitting: Neurology

## 2019-01-13 NOTE — Telephone Encounter (Signed)
I called the daughter.  The MRI of the brain does show some mild small vessel disease, most prominent in the left deep parietal white matter.  The degree of white matter changes is not significant enough to result in memory problems in of itself.   MRI brain 01/10/19:  IMPRESSION:   MRI brain (with and without) demonstrating: - Mild chronic small vessel ischemic disease.  - No acute findings.

## 2019-01-20 ENCOUNTER — Encounter: Payer: Self-pay | Admitting: Podiatry

## 2019-01-20 ENCOUNTER — Ambulatory Visit (INDEPENDENT_AMBULATORY_CARE_PROVIDER_SITE_OTHER): Payer: PPO | Admitting: Podiatry

## 2019-01-20 ENCOUNTER — Other Ambulatory Visit: Payer: Self-pay

## 2019-01-20 DIAGNOSIS — L84 Corns and callosities: Secondary | ICD-10-CM

## 2019-01-20 DIAGNOSIS — B351 Tinea unguium: Secondary | ICD-10-CM | POA: Diagnosis not present

## 2019-01-20 NOTE — Progress Notes (Signed)
This patient presents the office for preventative foot care services.  She says that the nails have become long and thick and are painful walking wearing her shoes.  She also has developed painful callus under the big toe joint right foot and at the tip of both third toes.  These calluses are painful walking and wearing her shoes.  She presents the office today with her daughter for preventative foot care services.  Patient states that she has had previous bunion surgery on both feet.     Vascular  Dorsalis pedis  are palpable  bilaterally. Posterior tibial pulses are weak/absent  B/L. Capillary return is within normal limits  bilaterally. Temperature is within normal limits  bilaterally.  Neurologic  Senn-Weinstein monofilament wire test within normal limits  bilaterally. Muscle power within normal limits bilaterally.  Nails Thick disfigured discolored nails with subungual debris  from second  to fifth toes bilaterally. No evidence of bacterial infection or drainage bilaterally.  Orthopedic  No limitations of motion  feet .  No crepitus or effusions noted.  No bony pathology or digital deformities noted.Contracted digits  B/L.  HAV with limited ROM  B/L.  Skin  normotropic skin noted bilaterally.  No signs of infections or ulcers noted.  Porokeratosis sub 1 right foot.  Hemmorrhagic pre-ulcerous lesions hallux left foot.  Distal clavi third toes  B/L.  Onychomycosis  X 8.  Debride callus and clavi  B/l.    Debride nails  X 8.  Debride callus B/L.   Padding was placed on her orthoses to help prevent pain sub 1st MPJ  Right foot.  .   RTC 9 weeks.Gardiner Barefoot DPM

## 2019-02-04 ENCOUNTER — Encounter: Payer: PPO | Admitting: Psychology

## 2019-02-18 DIAGNOSIS — R351 Nocturia: Secondary | ICD-10-CM | POA: Diagnosis not present

## 2019-02-25 ENCOUNTER — Other Ambulatory Visit: Payer: Self-pay

## 2019-02-25 ENCOUNTER — Ambulatory Visit (INDEPENDENT_AMBULATORY_CARE_PROVIDER_SITE_OTHER): Payer: PPO | Admitting: Psychology

## 2019-02-25 ENCOUNTER — Ambulatory Visit: Payer: PPO | Admitting: Psychology

## 2019-02-25 ENCOUNTER — Encounter: Payer: Self-pay | Admitting: Psychology

## 2019-02-25 DIAGNOSIS — G3184 Mild cognitive impairment, so stated: Secondary | ICD-10-CM | POA: Diagnosis not present

## 2019-02-25 DIAGNOSIS — F0391 Unspecified dementia with behavioral disturbance: Secondary | ICD-10-CM

## 2019-02-25 NOTE — Progress Notes (Signed)
   Psychometrician Note   [name] [name] completed 80 minutes of neuropsychological testing with technician, Milana Kidney, B.S., under the supervision of Dr. Christia Reading, Ph.D., licensed psychologist. The patient did not appear overtly distressed by the testing session, per behavioral observation or via self-report to the technician. Rest breaks were offered.    In considering the patient's current level of functioning, level of presumed impairment, nature of symptoms, emotional and behavioral responses during the interview, level of literacy, and observed level of motivation/effort, a battery of tests was selected and communicated to the psychometrician.   Communication between the psychologist and technician was ongoing throughout the testing session and changes were made as deemed necessary based on patient performance on testing, technician observations and additional pertinent factors such as those listed above.   [name] [name] will return within approximately two weeks for an interactive feedback session with Dr. Melvyn Novas at which time his test performances, clinical impressions, and treatment recommendations will be reviewed in detail. The patient understands she can contact our office should she require our assistance before this time.  80 minutes were spent face-to-face with patient administering standardized tests and 15 minutes were spent scoring (technician). [CPT [id number], [id number]]  This note reflects time spent with the psychometrician and does not include test scores or any clinical interpretations made by Dr. Melvyn Novas. The full report will follow in a separate note.

## 2019-02-25 NOTE — Progress Notes (Signed)
NEUROPSYCHOLOGICAL EVALUATION [name] [name]. [name] Department of Neurology  Reason for Referral:   [name] [name] is a 86 y.o. African-American female referred by [name] [name], M.D., to characterize her current cognitive functioning and assist with diagnostic clarity and treatment planning in the context of suspected cognitive decline and several medical comorbidities.  Assessment and Plan:   Clinical Impression(s): [name] [name]' pattern of performance is suggestive of prominent difficulties with both encoding (i.e., learning) and retrieval aspects of memory. Additional weaknesses and performance variability were exhibited across domains of processing speed, executive functioning (namely response inhibition and problem solving/hypothesis testing), confrontation naming, and visuospatial functioning. Domains of attention/concentration, cognitive flexibility, nonverbal abstract reasoning, receptive language, and verbal fluency were within normal limits. Despite evidence for cognitive dysfunction, both [name] [name] and her daughter largely denied difficulties performing instrumental ADLs. As such, criteria for dementia has not been met. Rather, [name] [name] meets criteria for a Mild Neurocognitive Disorder (formerly "mild cognitive impairment"), moderate severity, at the present time.  The etiology of her deficits is difficult to discern. Prominent difficulties with encoding and retrieval aspects of memory, coupled with weaknesses across confrontation naming, executive functioning, and visuospatial abilities can be concerning for a late-onset Alzheimer's disease presentation. However, [name] [name] scored appropriately across recognition trials of 2/3 memory measures, which is inconsistent with this presentation as it does not support the presence of rapid forgetting. Cognitive deficits could also be due to her history of various cardiovascular ailments and small vessel disease as seen on recent  neuroimaging. This would also place her at an increased risk for a "mixed dementia" presentation should her cognitive deficits progress further and impact her functional status. Continued medical monitoring will be important moving forward.  Recommendations: A repeat neuropsychological evaluation in 12-18 months (or sooner if functional decline is noted) is recommended to assess the trajectory of future cognitive decline should it occur. This will also aid in future efforts towards improved diagnostic clarity.  I do have concerns regarding [name] [name] living alone. These are largely due to her history of seemingly frequent falls, especially her event in July where she remained on the ground for up to 6 days without the ability to call for assistance. Her memory deficits also raise the risk for poor medication adherence, as well as leaving the stove on or other unsafe behaviors. If not already done, [name] [name] and her family should discuss future plans for caretaking and seek out community options for in home/residential care should they become necessary.  Should there be a progression of her current deficits over time, [name] [name] is unlikely to regain any independent living skills lost. Therefore, it is recommended that she remain as involved as possible in all aspects of household chores, finances, and medication management, with supervision to ensure adequate performance. She will likely benefit from the establishment and maintenance of a routine in order to maximize her functional abilities over time.  It will be important for [name] [name] to have another person with her when in situations where she may need to process information, weigh the pros and cons of different options, and make decisions, in order to ensure that she fully understands and recalls all information to be considered.  When learning new information, she would benefit from information being broken up into small, manageable pieces. She may  also find it helpful to articulate the material in her own words and in a context to promote encoding at the onset of a new task. Important information should  also be provided in a written format in all instances and placed in a highly visible or frequented location in her home.  Review of Records:   [name] [name] was seen by [facility] [name] [name], M.D.) on 12/16/2018 for follow-up for dementia. Dr. Cathren Laine note stated that [name] [name] had recently fallen in her apartment and remained on the ground for 6 days; the reason for this fall was unclear. Her daughter expressed concerns regarding dementia, stating that [name] [name] has exhibited slowly progressive cognitive changes over the past 3-4 years, as well as some personality changes surrounding increased irritability and agitation. She was said to misplace objects often, repeat herself frequently, and has trouble remembering upcoming appointments. Trouble performing instrumental ADLs (e.g., medication and financial management) were denied. She does not currently drive; records suggest that a "doctor stopped her from driving." Performance on a brief cognitive screening instrument (MMSE) was 28/30. Ultimately, [name] [name] was referred for a comprehensive neuropsychological evaluation to characterize her cognitive abilities and to assist with diagnostic clarity and treatment planning.   Head CT on 08/26/2018 revealed mild chronic microvascular ischemic change. Brain MRI on 01/11/2019 revealed the same.   Past Medical History:  Diagnosis Date  . Aortic stenosis   . Atrial fibrillation ([place])   . Carotid artery bruit 07/22/2012  . Chest pain    myoview 04/24/07-mild-mod perfusion defect with mild-mod superimposed ischemiain the mid inferior, apicla inferior, basal inferolateral and mid inferolateral regions  . Community acquired pneumonia 01/13/2013  . Edema   . Gallstone   . Generalized osteoarthritis   . HTN (hypertension)   . Hyperlipidemia    . Hypochloremia 01/13/2013  . Hyponatremia 01/13/2013  . IFG (impaired fasting glucose)   . Nonspecific abnormal electrocardiogram (ECG) (EKG)   . Normocytic anemia 08/26/2018  . Porokeratosis 09/02/2018  . Premature atrial contractions 07/22/2012  . Renal insufficiency 08/26/2018  . Rhabdomyolysis 08/26/2018  . Rheumatoid arthritis ([place]) 01/13/2013  . Vertigo    as late effect of cerebrovascular disease    Past Surgical History:  Procedure Laterality Date  . APPENDECTOMY    . BUNIONECTOMY Bilateral   . cataract surgery Bilateral 05/2016  . gallstones removal    . TONSILLECTOMY      Family History  Problem Relation Age of Onset  . Diabetes Father   . Seizures Brother   . Alzheimer's disease Sister   . Cancer Sister   . Breast cancer Sister   . Cancer Sister   . Seizures Daughter   . Seizures Son   . Seizures Granddaughter   . Ovarian cancer Granddaughter   . Lung cancer Nephew   . Breast cancer Niece   . Breast cancer Niece   . Brain cancer Niece      Current Outpatient Medications:  .  aspirin EC 81 MG tablet, Take 81 mg by mouth daily., Disp: , Rfl:  .  calcium-vitamin D (OSCAL WITH D) 500-200 MG-UNIT per tablet, Take 1 tablet by mouth daily., Disp: , Rfl:  .  donepezil (ARICEPT) 10 MG tablet, Take 10 mg by mouth at bedtime., Disp: , Rfl: 4 .  hydrochlorothiazide (HYDRODIURIL) 25 MG tablet, Take 25 mg by mouth daily., Disp: , Rfl:  .  mirabegron ER (MYRBETRIQ) 25 MG TB24 tablet, Take 25 mg by mouth daily., Disp: , Rfl:  .  oxybutynin (DITROPAN-XL) 10 MG 24 hr tablet, Take 10 mg by mouth daily., Disp: , Rfl:  .  rosuvastatin (CRESTOR) 20 MG tablet, Take 20 mg by mouth  daily., Disp: , Rfl:  .  sertraline (ZOLOFT) 100 MG tablet, Take 100 mg by mouth daily. , Disp: , Rfl: 3  Clinical Interview:   Cognitive Symptoms: Decreased short-term memory: Denied. However, her daughter described seemingly progressive memory difficulties present for the past 3-4 years.  Decreased  long-term memory: Denied. Decreased attention/concentration: Denied. Her daughter noted difficulties with Ms. Fojtik maintaining her focus, being more easily distracted, and losing her train of thought.  Reduced processing speed: Denied. Difficulties with executive functions: Denied. Difficulties with emotion regulation: Denied. However, her daughter described notable personality changes, including Ms. Rynders being more agitated and irritable. She also noted that Ms. Span has become increasingly accusatory and argumentative, especially surrounding items which she has misplaced (i.e., she thinks that they have been stolen from her by family members at times).  Difficulties with receptive language: Denied. Difficulties with word finding: Denied. Decreased visuoperceptual ability: Denied.  Difficulties completing ADLs: Largely denied, Ms. Will noted no trouble managing her medications or personal finances; her daughter was in agreement with this. Ms. Gaultney did acknowledge that she stopped driving due to a unspecified doctor instructing her to. The reasoning for this was unknown. However, her daughter mentioned that it was potentially related to her incident in July where she fell and was on the floor for a total of 6 days.   Additional Medical History: History of traumatic brain injury/concussion: Denied. History of stroke: Denied. History of seizure activity: Denied. History of known exposure to toxins: Denied. Symptoms of chronic pain: Endorsed. Manageable levels of pain were localized to her feet due to the presence of "black" calloses.  Experience of frequent headaches/migraines: Denied. Frequent instances of dizziness/vertigo: Denied.  Sensory changes: Denied. Balance/coordination difficulties: Endorsed. Ms. Wygant' daughter noted prominent balance difficulties and that she commonly ambulates with either a walker or cane for assistance. She also reported frequent falls, including one  immediately prior to the current evaluation.  Other motor difficulties: Denied.  Sleep History: Estimated hours obtained each night: 8 hours. Difficulties falling asleep: Denied. Difficulties staying asleep: Denied. Feels rested and refreshed upon awakening: Endorsed.  History of snoring: Denied. History of waking up gasping for air: Denied. Witnessed breath cessation while asleep: Denied.  History of vivid dreaming: Denied. Excessive movement while asleep: Denied. Instances of acting out her dreams: Denied.  Psychiatric/Behavioral Health History: Depression: Denied. Ms. Soldo denied prior symptoms of depression, as well as prior mental health diagnoses. Her daughter noted that she does take mood-related medications currently. Ms. Newhall described her current mood as "good" and denied current or remote suicidal ideation, intent, or plan. Anxiety: Denied. Mania: Denied. Trauma History: Denied. Visual/auditory hallucinations: Denied. Delusional thoughts: Denied.  Tobacco: Denied. Alcohol: Ms. Taha denied current alcohol consumption, as well as a history of problematic alcohol abuse or dependence. Recreational drugs: Denied. Caffeine: Rare caffeine consumption.  Academic/Vocational History: Highest level of educational attainment: 14 years. Ms. Sanseverino completed high school, as well as an additional 2 years of college. 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 Mathematics was noted as a relative weakness.  History of developmental delay: Denied. History of grade repetition: Denied. Enrollment in special education courses: Denied. History of diagnosed specific learning disability: Denied. History of ADHD: Denied.  Employment: Retired. She previously worked as a Chief Financial Officer for [employer], retiring in 1996.  Evaluation Results:   Behavioral Observations: Ms. Delawder was accompanied by her daughter and was appropriately dressed and groomed. She was  approximately 30 minutes late to  the current appointment due to her experiencing a fall immediately beforehand while at home. She ambulated with the assistance of a cane, but moved at an appropriate speed and did not require other sources of support. Gross motor functioning appeared intact upon informal observation and no abnormal movements (e.g., tremors) were noted. Her affect was restricted and she was unhappy to be present during the initial interview. This did appear to fade and she became more relaxed as the interview progressed. Spontaneous speech was limited to generally single word or short phrase responses. Word finding difficulties were not observed. Sustained attention was appropriate throughout. Thought processes were coherent, organized, and normal in content. Her affect improved throughout testing procedures, task engagement was adequate, and she persisted when challenged. Overall, Ms. Kriegel was cooperative with the clinical interview and subsequent testing procedures.   Adequacy of Effort: The validity of neuropsychological testing is limited by the extent to which the individual being tested may be assumed to have exerted adequate effort during testing. Ms. Lindenberger expressed her intention to perform to the best of her abilities and exhibited adequate task engagement and persistence. Scores across stand-alone and embedded performance validity measures were within expectation. As such, the results of the current evaluation are believed to be a valid representation of Ms. Deike' current cognitive functioning.  Test Results: Ms. Wamser was largely oriented at the time of the current evaluation. Points were lost for her stating the incorrect year ("2001").  Intellectual abilities based upon educational and vocational attainment were estimated to be in the average range. Premorbid abilities were estimated to be within the below average range based upon a single-word reading test.   Processing speed  was variable, ranging from the well below average to above average normative ranges. Basic attention was below average. More complex attention (e.g., working memory) was average. Executive functioning was variable. Cognitive flexibility and nonverbal abstract reasoning were within normal limits, while weaknesses were exhibited across response inhibition and problem solving/hypothesis testing.  While not directly assessed, receptive language abilities are believed to be functionally intact. Ms. Shanon Brow did exhibit mild difficulties understanding task instructions as the complexity of these instructions increased. Assessed expressive language (e.g., verbal fluency and confrontation naming) was variable. Verbal fluency was within normal limits, while confrontation naming was variable, but largely below expectation.     Basic visuoconstructional abilities were largely within normal limits. However, more complex visuospatial/visuoconstructional abilities were often exceptionally low.    Learning (i.e., encoding) of novel verbal and visual information was exceptionally low. Spontaneous delayed recall (i.e., retrieval) of previously learned information was also exceptionally low. Retention rates were very poor across memory measures, with Ms. Nipper exhibiting an amnestic profile across 2/3 memory measures. Performance across recognition tasks was variable, suggesting limited evidence for information consolidation.   Results of emotional screening instruments suggested that recent symptoms of generalized anxiety were in the minimal range, while symptoms of depression were within normal limits. A screening instrument assessing recent sleep quality suggested the presence of minimal sleep dysfunction.  Tables of Scores:   Note: This summary of test scores accompanies the interpretive report and should not be considered in isolation without reference to the appropriate sections in the text. Descriptors are based on  appropriate normative data and may be adjusted based on clinical judgment. The terms "impaired" and "within normal limits (WNL)" are used when a more specific level of functioning cannot be determined.       Effort Testing:   DESCRIPTOR  Dot Counting Test: --- --- Within Expectation  RBANS Effort Index: --- --- Within Expectation  WAIS-IV Reliable Digit Span: --- --- Within Expectation       Orientation:      Raw Score Percentile   NAB Orientation, Form 1 27/29 --- ---       Cognitive Screening:          RBANS, Form A: Standard Score/ Scaled Score Percentile   Total Score 66 1 Exceptionally Low  Immediate Memory 57 <1 Exceptionally Low    List Learning 3 1 Exceptionally Low    Story Memory 3 1 Exceptionally Low  Visuospatial/Constructional 60 <1 Exceptionally Low    Figure Copy 3 1 Exceptionally Low    Line Orientation 7/20 <2 Exceptionally Low  Language 100 50 Average    Picture Naming 8/10 3-9 Well Below Average    Semantic Fluency 14 91 Above Average  Attention 94 34 Average    Digit Span 13 84 Above Average    Coding 5 5 Well Below Average  Delayed Memory 56 <1 Exceptionally Low    List Recall 0/10 <2 Exceptionally Low    List Recognition 14/20 <2 Exceptionally Low    Story Recall 1 <1 Exceptionally Low    Story Recognition 7/12 7-13 Below Average    Figure Recall 6 9 Below Average    Figure Recognition 5/8 39-57 Average       Intellectual Functioning:           Standard Score Percentile   Test of Premorbid Functioning: 82 12 Below Average       Attention/Executive Function:          Trail Making Test (TMT): Raw Score (T Score) Percentile     Part A 44 secs.,  0 errors (54) 66 Average    Part B 252 secs.,  1 error (43) 25 Average        Scaled Score Percentile   WAIS-IV Digit Span: 8 25 Average    Forward 6 9 Below Average    Backward 11 63 Average    Sequencing 9 37 Average       D-KEFS Color-Word Interference Test: Raw Score (Scaled Score)  Percentile     Color Naming 36 secs. (10) 50 Average    Word Reading 22 secs. (12) 75 Above Average    Inhibition 67 secs. (13) 84 Above Average      Total Errors 10 errors (5) 5 Well Below Average    Inhibition/Switching 112 secs. (8) 25 Average      Total Errors 13 errors (1) <1 Exceptionally Low       D-KEFS Verbal Fluency Test: Raw Score (Scaled Score) Percentile     Letter Total Correct 40 (12) 75 Above Average    Category Total Correct 27 (9) 37 Average    Category Switching Total Correct 10 (9) 37 Average    Category Switching Accuracy 8 (9) 37 Average      Total Set Loss Errors 3 (9) 37 Average      Total Repetition Errors 8 (6) 9 Below Average       D-KEFS 20 Questions Test: Scaled Score Percentile     Total Weighted Achievement Score Discontinued --- Impaired    Initial Abstraction Score --- --- ---       Language:          Verbal Fluency Test: Raw Score (T Score) Percentile     Phonemic Fluency (FAS) 40 (60) 84 Above Average  Animal Fluency 11 (46) 34 Average        NAB Language Module, Form 1: T Score Percentile     Naming 20/31 (23) <1 Exceptionally Low       Visuospatial/Visuoconstruction:      Raw Score Percentile   Clock Drawing: 8/10 --- Within Normal Limits        Scaled Score Percentile   WAIS-IV Matrix Reasoning: 7 16 Below Average       Mood and Personality:      Raw Score Percentile   Geriatric Depression Scale: 4 --- Within Normal Limits  Geriatric Anxiety Scale: 4 --- Minimal    Somatic 0 --- Minimal    Cognitive 0 --- Minimal    Affective 4 --- Mild       Additional Questionnaires:      Raw Score Percentile   PROMIS Sleep Disturbance Questionnaire: 12 --- None to Slight   Informed Consent and Coding/Compliance:   Ms. Porcaro was provided with a verbal description of the nature and purpose of the present neuropsychological evaluation. Also reviewed were the foreseeable risks and/or discomforts and benefits of the procedure, limits of  confidentiality, and mandatory reporting requirements of this provider. The patient was given the opportunity to ask questions and receive answers about the evaluation. Oral consent to participate was provided by the patient.   This evaluation was conducted by Christia Reading, Ph.D., licensed clinical neuropsychologist. Ms. Khurana completed a 30-minute clinical interview, billed as one unit [phone], and 95 minutes of cognitive testing, billed as one unit [phone] and two additional units 96139. Psychometrist Milana Kidney, B.S., assisted Dr. Melvyn Novas with test administration and scoring procedures. As a separate and discrete service, Dr. Melvyn Novas spent a total of 180 minutes in interpretation and report writing, billed as one unit 96132 and two units 96133.

## 2019-02-26 ENCOUNTER — Encounter: Payer: Self-pay | Admitting: Psychology

## 2019-02-26 DIAGNOSIS — G3184 Mild cognitive impairment, so stated: Secondary | ICD-10-CM | POA: Insufficient documentation

## 2019-02-26 HISTORY — DX: Mild cognitive impairment of uncertain or unknown etiology: G31.84

## 2019-03-04 DIAGNOSIS — F0280 Dementia in other diseases classified elsewhere without behavioral disturbance: Secondary | ICD-10-CM | POA: Diagnosis not present

## 2019-03-04 DIAGNOSIS — Z7982 Long term (current) use of aspirin: Secondary | ICD-10-CM | POA: Diagnosis not present

## 2019-03-04 DIAGNOSIS — E785 Hyperlipidemia, unspecified: Secondary | ICD-10-CM | POA: Diagnosis not present

## 2019-03-04 DIAGNOSIS — I350 Nonrheumatic aortic (valve) stenosis: Secondary | ICD-10-CM | POA: Diagnosis not present

## 2019-03-04 DIAGNOSIS — M059 Rheumatoid arthritis with rheumatoid factor, unspecified: Secondary | ICD-10-CM | POA: Diagnosis not present

## 2019-03-04 DIAGNOSIS — I739 Peripheral vascular disease, unspecified: Secondary | ICD-10-CM | POA: Diagnosis not present

## 2019-03-04 DIAGNOSIS — Z9181 History of falling: Secondary | ICD-10-CM | POA: Diagnosis not present

## 2019-03-04 DIAGNOSIS — I10 Essential (primary) hypertension: Secondary | ICD-10-CM | POA: Diagnosis not present

## 2019-03-04 DIAGNOSIS — E039 Hypothyroidism, unspecified: Secondary | ICD-10-CM | POA: Diagnosis not present

## 2019-03-04 DIAGNOSIS — R7303 Prediabetes: Secondary | ICD-10-CM | POA: Diagnosis not present

## 2019-03-06 ENCOUNTER — Ambulatory Visit (INDEPENDENT_AMBULATORY_CARE_PROVIDER_SITE_OTHER): Payer: PPO | Admitting: Psychology

## 2019-03-06 ENCOUNTER — Ambulatory Visit: Payer: Self-pay | Admitting: Cardiology

## 2019-03-06 ENCOUNTER — Other Ambulatory Visit: Payer: Self-pay

## 2019-03-06 DIAGNOSIS — G3184 Mild cognitive impairment, so stated: Secondary | ICD-10-CM | POA: Diagnosis not present

## 2019-03-06 NOTE — Progress Notes (Signed)
   Neuropsychology Feedback Session [name] [name]. [place] Department of Neurology  Reason for Referral:   [name] [name] a 86 y.o. African-American female referred by Sarina Ill, M.D.,to characterize hercurrent cognitive functioning and assist with diagnostic clarity and treatment planning in the context of suspected cognitive decline and several medical comorbidities.  Feedback:   [name] [name] completed a comprehensive neuropsychological evaluation on 02/25/2019. Please refer to that encounter for the full report and recommendations. Briefly, results suggested prominent difficulties with both encoding (i.e., learning) and retrieval aspects of memory. Additional weaknesses and performance variability were exhibited across domains of processing speed, executive functioning (namely response inhibition and problem solving/hypothesis testing), confrontation naming, and visuospatial functioning. Despite evidence for cognitive dysfunction, both [name] [name] and her daughter largely denied difficulties performing instrumental ADLs. As such, criteria for dementia has not been met. Rather, [name] [name] meets criteria for a mild neurocognitive disorder (formerly "mild cognitive impairment"), moderate severity, at the present time. The etiology of her deficits is difficult to discern. Prominent difficulties with encoding and retrieval aspects of memory, coupled with weaknesses across confrontation naming, executive functioning, and visuospatial abilities can be concerning for a late-onset Alzheimer's disease presentation. However, [name] [name] scored appropriately across recognition trials of 2/3 memory measures, which is inconsistent with this presentation as it does not support the presence of rapid forgetting. Continued medical monitoring will be important moving forward.  [name] [name] was accompanied by several members of her family via a conference call. Content of the current session focused on the  results of her neuropsychological evaluation. [name] [name] and her family were given the opportunity to ask questions and their questions were answered. They were also encouraged to reach out should additional questions arise.     Between 30 and 60 minutes were spent preparing for and conducting the current feedback session with [name] [name], billed as one unit [phone].

## 2019-03-11 DIAGNOSIS — I739 Peripheral vascular disease, unspecified: Secondary | ICD-10-CM | POA: Diagnosis not present

## 2019-03-11 DIAGNOSIS — F0280 Dementia in other diseases classified elsewhere without behavioral disturbance: Secondary | ICD-10-CM | POA: Diagnosis not present

## 2019-03-11 DIAGNOSIS — E039 Hypothyroidism, unspecified: Secondary | ICD-10-CM | POA: Diagnosis not present

## 2019-03-11 DIAGNOSIS — M059 Rheumatoid arthritis with rheumatoid factor, unspecified: Secondary | ICD-10-CM | POA: Diagnosis not present

## 2019-03-11 DIAGNOSIS — Z9181 History of falling: Secondary | ICD-10-CM | POA: Diagnosis not present

## 2019-03-11 DIAGNOSIS — I10 Essential (primary) hypertension: Secondary | ICD-10-CM | POA: Diagnosis not present

## 2019-03-11 DIAGNOSIS — Z7982 Long term (current) use of aspirin: Secondary | ICD-10-CM | POA: Diagnosis not present

## 2019-03-11 DIAGNOSIS — R7303 Prediabetes: Secondary | ICD-10-CM | POA: Diagnosis not present

## 2019-03-11 DIAGNOSIS — E785 Hyperlipidemia, unspecified: Secondary | ICD-10-CM | POA: Diagnosis not present

## 2019-03-11 DIAGNOSIS — I350 Nonrheumatic aortic (valve) stenosis: Secondary | ICD-10-CM | POA: Diagnosis not present

## 2019-03-12 ENCOUNTER — Other Ambulatory Visit: Payer: Self-pay

## 2019-03-12 ENCOUNTER — Ambulatory Visit (INDEPENDENT_AMBULATORY_CARE_PROVIDER_SITE_OTHER): Payer: PPO | Admitting: Podiatry

## 2019-03-12 DIAGNOSIS — M79674 Pain in right toe(s): Secondary | ICD-10-CM | POA: Diagnosis not present

## 2019-03-12 DIAGNOSIS — M79675 Pain in left toe(s): Secondary | ICD-10-CM

## 2019-03-12 DIAGNOSIS — B351 Tinea unguium: Secondary | ICD-10-CM

## 2019-03-12 DIAGNOSIS — Q828 Other specified congenital malformations of skin: Secondary | ICD-10-CM

## 2019-03-13 ENCOUNTER — Encounter: Payer: Self-pay | Admitting: Cardiovascular Disease

## 2019-03-13 ENCOUNTER — Ambulatory Visit: Payer: PPO | Admitting: Cardiovascular Disease

## 2019-03-13 VITALS — BP 158/77 | HR 60 | Temp 97.0°F | Ht 66.0 in | Wt 172.6 lb

## 2019-03-13 DIAGNOSIS — I480 Paroxysmal atrial fibrillation: Secondary | ICD-10-CM

## 2019-03-13 DIAGNOSIS — R001 Bradycardia, unspecified: Secondary | ICD-10-CM | POA: Diagnosis not present

## 2019-03-13 DIAGNOSIS — I350 Nonrheumatic aortic (valve) stenosis: Secondary | ICD-10-CM | POA: Diagnosis not present

## 2019-03-13 DIAGNOSIS — I10 Essential (primary) hypertension: Secondary | ICD-10-CM

## 2019-03-13 DIAGNOSIS — E782 Mixed hyperlipidemia: Secondary | ICD-10-CM

## 2019-03-13 NOTE — Assessment & Plan Note (Signed)
History of hyperlipidemia on statin therapy with lipid profile performed 10/20/20000000 total cholesterol of 146, LDL of 82 and HDL of 69.

## 2019-03-13 NOTE — Progress Notes (Signed)
03/13/2019 [name] [name]   [birth year]  [id number]  Primary Physician Merrilee Seashore, MD Primary Cardiologist: Lorretta Harp MD Renae Gloss  HPI:  [name] [name] is a 86 y.o.  moderately overweight divorced African-American female mother of 25, grandmother of 85 grandchildren is accompanied by her daughter [name] [name] today.  She was referred by the hospitalist during her recent hospitalization for an episode of PAF.  She has seen Dr. Sallyanne Kuster remotely back in 2014.    I last saw her in the office 09/30/2018.  She has a history of treated hypertension, and hyperlipidemia as well as moderate aortic stenosis which has not progressed in the last 6 years by recent 2D echo.  She apparently fell in her bathroom at home and was on the ground for 6 days before coming to the hospital.  She was dehydrated and was in rhabdomyolysis.  She had a brief episode of PAF and was found on telemetry but otherwise had sinus rhythm with PACs  Since I saw her 6 months ago she has had recurrent episode of syncope where she was found down by her daughter going to the front door.  She was unaware of how this occurred.  She does live alone but does have some home health care.  She denies chest pain or shortness of breath.  She does have moderate aortic stenosis by 2D echocardiogram performed 08/26/2018 with a valve area of 1.2 cm.   Current Meds  Medication Sig  . aspirin EC 81 MG tablet Take 81 mg by mouth daily.  . calcium-vitamin D (OSCAL WITH D) 500-200 MG-UNIT per tablet Take 1 tablet by mouth daily.  Marland Kitchen donepezil (ARICEPT) 10 MG tablet Take 10 mg by mouth at bedtime.  . hydrochlorothiazide (HYDRODIURIL) 25 MG tablet Take 25 mg by mouth daily.  . mirabegron ER (MYRBETRIQ) 25 MG TB24 tablet Take 25 mg by mouth daily.  Marland Kitchen oxybutynin (DITROPAN-XL) 10 MG 24 hr tablet Take 10 mg by mouth daily.  . rosuvastatin (CRESTOR) 20 MG tablet Take 20 mg by mouth daily.  . sertraline (ZOLOFT) 100 MG tablet Take 100 mg by  mouth daily.      Allergies  Allergen Reactions  . Codeine Nausea And Vomiting and Other (See Comments)    Dizziness    Social History   Socioeconomic History  . Marital status: Divorced    Spouse name: Not on file  . Number of children: 4  . Years of education: 75  . Highest education level: Some college, no degree  Occupational History  . Not on file  Tobacco Use  . Smoking status: Never Smoker  . Smokeless tobacco: Never Used  Substance and Sexual Activity  . Alcohol use: No  . Drug use: No  . Sexual activity: Never  Other Topics Concern  . Not on file  Social History Narrative   Lives at home alone   Right handed   Caffeine: sometimes   Social Determinants of Health   Financial Resource Strain:   . Difficulty of Paying Living Expenses: Not on file  Food Insecurity:   . Worried About Charity fundraiser in the Last Year: Not on file  . Ran Out of Food in the Last Year: Not on file  Transportation Needs:   . Lack of Transportation (Medical): Not on file  . Lack of Transportation (Non-Medical): Not on file  Physical Activity:   . Days of Exercise per Week: Not on file  . Minutes of Exercise  per Session: Not on file  Stress:   . Feeling of Stress : Not on file  Social Connections:   . Frequency of Communication with Friends and Family: Not on file  . Frequency of Social Gatherings with Friends and Family: Not on file  . Attends Religious Services: Not on file  . Active Member of Clubs or Organizations: Not on file  . Attends Archivist Meetings: Not on file  . Marital Status: Not on file  Intimate Partner Violence:   . Fear of Current or Ex-Partner: Not on file  . Emotionally Abused: Not on file  . Physically Abused: Not on file  . Sexually Abused: Not on file     Review of Systems: General: negative for chills, fever, night sweats or weight changes.  Cardiovascular: negative for chest pain, dyspnea on exertion, edema, orthopnea, palpitations,  paroxysmal nocturnal dyspnea or shortness of breath Dermatological: negative for rash Respiratory: negative for cough or wheezing Urologic: negative for hematuria Abdominal: negative for nausea, vomiting, diarrhea, bright red blood per rectum, melena, or hematemesis Neurologic: negative for visual changes, syncope, or dizziness All other systems reviewed and are otherwise negative except as noted above.    Blood pressure (!) 158/77, pulse 60, temperature (!) 97 F (36.1 C), height 5\' 6"  (1.676 m), weight 172 lb 9.6 oz (78.3 kg), SpO2 96 %.  General appearance: alert and no distress Neck: no adenopathy, no carotid bruit, no JVD, supple, symmetrical, trachea midline and thyroid not enlarged, symmetric, no tenderness/mass/nodules Lungs: clear to auscultation bilaterally Heart: Soft outflow tract murmur Extremities: extremities normal, atraumatic, no cyanosis or edema Pulses: 2+ and symmetric Skin: Skin color, texture, turgor normal. No rashes or lesions Neurologic: Alert and oriented X 3, normal strength and tone. Normal symmetric reflexes. Normal coordination and gait  EKG sinus bradycardia 52 with PA ACs and borderline LVH with nonspecific ST and T wave changes.  I personally reviewed this EKG.  ASSESSMENT AND PLAN:   HTN (hypertension) History of essential hypertension with blood pressure measured today 158/77.  She is on HydroDIURIL.  Hyperlipidemia History of hyperlipidemia on statin therapy with lipid profile performed 10/20/20000000 total cholesterol of 146, LDL of 82 and HDL of 69.  Aortic stenosis History of moderate aortic stenosis 2D echo performed 08/27/2018 with a valve area of 1.2 cm.  The patient is minimally ambulatory, she lives alone she has had several episodes of syncope of unclear etiology.  She is also bradycardic.  She does not want any invasive procedures such as aortic valve replacement with TAVR or permanent transvenous pacemaker insertion.  We had a discussion  during the visit and her daughter was present.  Slow heart rate Ms. Enge is bradycardic today with a heart rate of 53 with PACs.  She is on no negative chronotropic drugs.  Certainly possible that her syncope was multifactorial from bradycardia/pauses plus or minus moderate aortic stenosis.  Patient does not wish any invasive procedures to be performed and therefore there is no utility in performing event monitoring.      Lorretta Harp MD FACP,FACC,FAHA, [facility] 03/13/2019 3:56 PM

## 2019-03-13 NOTE — Patient Instructions (Signed)
Medication Instructions:  Your physician recommends that you continue on your current medications as directed. Please refer to the Current Medication list given to you today.  If you need a refill on your cardiac medications before your next appointment, please call your pharmacy.   Lab work: NONE  Testing/Procedures: NONE  Follow-Up: At [facility], you and your health needs are our priority.  As part of our continuing mission to provide you with exceptional heart care, we have created designated Provider Care Teams.  These Care Teams include your primary Cardiologist (physician) and Advanced Practice Providers (APPs -  Physician Assistants and Nurse Practitioners) who all work together to provide you with the care you need, when you need it. You may see Dr. Gwenlyn Found or one of the following Advanced Practice Providers on your designated Care Team:    Kerin Ransom, PA-C  [place], [place]  Coletta Memos, [place]  Your physician wants you to follow-up in: 1 year with Dr. Gwenlyn Found

## 2019-03-13 NOTE — Assessment & Plan Note (Signed)
[name] [name] is bradycardic today with a heart rate of 53 with PACs.  She is on no negative chronotropic drugs.  Certainly possible that her syncope was multifactorial from bradycardia/pauses plus or minus moderate aortic stenosis.  Patient does not wish any invasive procedures to be performed and therefore there is no utility in performing event monitoring.

## 2019-03-13 NOTE — Assessment & Plan Note (Signed)
History of moderate aortic stenosis 2D echo performed 08/27/2018 with a valve area of 1.2 cm.  The patient is minimally ambulatory, she lives alone she has had several episodes of syncope of unclear etiology.  She is also bradycardic.  She does not want any invasive procedures such as aortic valve replacement with TAVR or permanent transvenous pacemaker insertion.  We had a discussion during the visit and her daughter was present.

## 2019-03-13 NOTE — Assessment & Plan Note (Signed)
History of essential hypertension with blood pressure measured today 158/77.  She is on HydroDIURIL.

## 2019-03-16 NOTE — Progress Notes (Signed)
Subjective: 86 y.o. returns the office today for painful, elongated, thickened toenails which they cannot trim herself as well as for painful calluses to her feet.  Denies any redness or drainage or swelling to the nail or callus sites. No other concerns today.Denies any systemic complaints such as fevers, chills, nausea, vomiting.   Objective: AAO 3, NAD DP/PT pulses palpable, CRT less than 3 seconds Nails hypertrophic, dystrophic, elongated, brittle, discolored 10. There is tenderness overlying the nails 1-5 bilaterally. There is no surrounding erythema or drainage along the nail sites. Hyperkeratotic lesion to the plantar aspect of the right foot submetatarsal 1 and along the distal 3rd toes bilaterally. There is still dried blood under the calluses but there is no open lesions.  Also a newer lesion on the left hallux without any ulceration drainage or signs of infection. No open lesions or pre-ulcerative lesions are identified. No pain with calf compression, swelling, warmth, erythema.  Assessment: Patient presents with symptomatic onychomycosis; hyperkeratotic lesions; plantarflexed metatarsal  Plan: -Treatment options including alternatives, risks, complications were discussed -Nails sharply debrided 10 without complication/bleeding. -Hyperkeratotic lesion sharply debrided 4 without complications or bleeding.  Continue moisturizer and offloading. -Discussed daily foot inspection. If there are any changes, to call the office immediately.  -Follow-up in as scheduled or sooner if any problems are to arise. In the meantime, encouraged to call the office with any questions, concerns, changes symptoms.  Celesta Gentile, DPM

## 2019-03-25 ENCOUNTER — Ambulatory Visit: Payer: PPO | Admitting: Podiatry

## 2019-03-27 DIAGNOSIS — Z7982 Long term (current) use of aspirin: Secondary | ICD-10-CM | POA: Diagnosis not present

## 2019-03-27 DIAGNOSIS — I10 Essential (primary) hypertension: Secondary | ICD-10-CM | POA: Diagnosis not present

## 2019-03-27 DIAGNOSIS — M059 Rheumatoid arthritis with rheumatoid factor, unspecified: Secondary | ICD-10-CM | POA: Diagnosis not present

## 2019-03-27 DIAGNOSIS — I739 Peripheral vascular disease, unspecified: Secondary | ICD-10-CM | POA: Diagnosis not present

## 2019-03-27 DIAGNOSIS — I350 Nonrheumatic aortic (valve) stenosis: Secondary | ICD-10-CM | POA: Diagnosis not present

## 2019-03-27 DIAGNOSIS — Z9181 History of falling: Secondary | ICD-10-CM | POA: Diagnosis not present

## 2019-03-27 DIAGNOSIS — E785 Hyperlipidemia, unspecified: Secondary | ICD-10-CM | POA: Diagnosis not present

## 2019-03-27 DIAGNOSIS — E039 Hypothyroidism, unspecified: Secondary | ICD-10-CM | POA: Diagnosis not present

## 2019-03-27 DIAGNOSIS — R7303 Prediabetes: Secondary | ICD-10-CM | POA: Diagnosis not present

## 2019-03-27 DIAGNOSIS — F0280 Dementia in other diseases classified elsewhere without behavioral disturbance: Secondary | ICD-10-CM | POA: Diagnosis not present

## 2019-03-31 DIAGNOSIS — H524 Presbyopia: Secondary | ICD-10-CM | POA: Diagnosis not present

## 2019-03-31 DIAGNOSIS — Z961 Presence of intraocular lens: Secondary | ICD-10-CM | POA: Diagnosis not present

## 2019-03-31 DIAGNOSIS — H353131 Nonexudative age-related macular degeneration, bilateral, early dry stage: Secondary | ICD-10-CM | POA: Diagnosis not present

## 2019-03-31 DIAGNOSIS — H5212 Myopia, left eye: Secondary | ICD-10-CM | POA: Diagnosis not present

## 2019-04-01 DIAGNOSIS — M0589 Other rheumatoid arthritis with rheumatoid factor of multiple sites: Secondary | ICD-10-CM | POA: Diagnosis not present

## 2019-04-01 DIAGNOSIS — F0390 Unspecified dementia without behavioral disturbance: Secondary | ICD-10-CM | POA: Diagnosis not present

## 2019-04-01 DIAGNOSIS — E785 Hyperlipidemia, unspecified: Secondary | ICD-10-CM | POA: Diagnosis not present

## 2019-04-01 DIAGNOSIS — I10 Essential (primary) hypertension: Secondary | ICD-10-CM | POA: Diagnosis not present

## 2019-04-01 DIAGNOSIS — M150 Primary generalized (osteo)arthritis: Secondary | ICD-10-CM | POA: Diagnosis not present

## 2019-04-09 ENCOUNTER — Ambulatory Visit: Payer: PPO | Admitting: Podiatry

## 2019-04-10 DIAGNOSIS — E039 Hypothyroidism, unspecified: Secondary | ICD-10-CM | POA: Diagnosis not present

## 2019-04-10 DIAGNOSIS — F0280 Dementia in other diseases classified elsewhere without behavioral disturbance: Secondary | ICD-10-CM | POA: Diagnosis not present

## 2019-04-10 DIAGNOSIS — I739 Peripheral vascular disease, unspecified: Secondary | ICD-10-CM | POA: Diagnosis not present

## 2019-04-10 DIAGNOSIS — I10 Essential (primary) hypertension: Secondary | ICD-10-CM | POA: Diagnosis not present

## 2019-04-16 ENCOUNTER — Other Ambulatory Visit: Payer: Self-pay

## 2019-04-16 ENCOUNTER — Ambulatory Visit: Payer: PPO | Admitting: Podiatry

## 2019-04-16 DIAGNOSIS — M79675 Pain in left toe(s): Secondary | ICD-10-CM | POA: Diagnosis not present

## 2019-04-16 DIAGNOSIS — Q828 Other specified congenital malformations of skin: Secondary | ICD-10-CM | POA: Diagnosis not present

## 2019-04-16 DIAGNOSIS — M79674 Pain in right toe(s): Secondary | ICD-10-CM

## 2019-04-16 DIAGNOSIS — B351 Tinea unguium: Secondary | ICD-10-CM | POA: Diagnosis not present

## 2019-04-20 ENCOUNTER — Other Ambulatory Visit: Payer: Self-pay | Admitting: Internal Medicine

## 2019-04-20 DIAGNOSIS — R921 Mammographic calcification found on diagnostic imaging of breast: Secondary | ICD-10-CM

## 2019-04-22 ENCOUNTER — Ambulatory Visit: Payer: PPO | Admitting: Family Medicine

## 2019-04-23 ENCOUNTER — Ambulatory Visit: Payer: PPO | Attending: Family

## 2019-04-23 ENCOUNTER — Encounter: Payer: Self-pay | Admitting: Family Medicine

## 2019-04-23 ENCOUNTER — Other Ambulatory Visit: Payer: Self-pay

## 2019-04-23 ENCOUNTER — Ambulatory Visit: Payer: PPO | Admitting: Family Medicine

## 2019-04-23 VITALS — BP 175/69 | HR 51 | Ht 66.0 in | Wt 172.0 lb

## 2019-04-23 DIAGNOSIS — G3184 Mild cognitive impairment, so stated: Secondary | ICD-10-CM

## 2019-04-23 DIAGNOSIS — Z23 Encounter for immunization: Secondary | ICD-10-CM | POA: Insufficient documentation

## 2019-04-23 MED ORDER — MEMANTINE HCL 5 MG PO TABS: 5.0000 mg | ORAL_TABLET | Freq: Two times a day (BID) | ORAL | 5 refills | Status: DC

## 2019-04-23 NOTE — Progress Notes (Signed)
   Covid-19 Vaccination Clinic  Name:  [name] [name]    MRN: [id number] DOB: 1928/10/17  04/23/2019  [name] was observed post Covid-19 immunization for 15 minutes without incident. She was provided with Vaccine Information Sheet and instruction to access the V-Safe system.   [name] was instructed to call 911 with any severe reactions post vaccine: Marland Kitchen Difficulty breathing  . Swelling of face and throat  . A fast heartbeat  . A bad rash all over body  . Dizziness and weakness   Immunizations Administered    Name Date Dose VIS Date Route   Moderna COVID-19 Vaccine 04/23/2019  3:49 PM 0.5 mL 01/20/2019 Intramuscular   Manufacturer: Moderna   Lot: [id number]   [place]: [id number]

## 2019-04-23 NOTE — Progress Notes (Addendum)
PATIENT: [name] [name] DOB: 1929-01-08  REASON FOR VISIT: follow up HISTORY FROM: patient  Chief Complaint  Patient presents with  . Follow-up    RM 2, with daughter. Reports that she has been stable.     HISTORY OF PRESENT ILLNESS: Today 04/23/19 [name] [name] is a 86 y.o. female here today for follow up for memory loss. She was evaluated by neuropsychology who felt that she had a moderate level of MCI. She is living in independent living. She is now using a life alert necklace.  She did have one small trip since last being seen but fortunately there were no injuries.  Family initiated life alert following that fall.  She does live alone.  Her daughter comes by to see her at least 3-4 times a week.  Someone calls to check on her every day.  She is not driving at this time.  She is able to dose her own medications.  She is able to perform all activities of daily living independently.  HISTORY: (copied from Dr Cathren Laine note on 12/16/2018)  HPI:  [name] [name] is a 86 y.o. female here as requested by Merrilee Seashore, MD for dementia.  Past medical history "senile dementia, without behavioral disturbances". PMHx afib, HTN, Rhabdo, RA, renal insufficiency. Here with her daughter who provides most information. Patient lives alone. She had fallen and in her apartment for 6 days, unsure why, they found her in [place] and some sores.Her sister has dementia, patient denies she has dementia. Daughter noticed changes 3-4 years ago, slowly progressive, patient is irritable, agitated, patient blames daughter, she accused daughter of taking her blow dryer (it was found), she loses things, started with short-term memory loss, losing things, repeating the same stories over and over, forgetting appointments and losing hr wallet and other items. She takes her own medications. Not missing any bills, paying all the bills. Doctor stopped her from driving. She is on Aricept. She is weaker due to the fall. She has to  walk with a cane. They used tohave a nurse come in from insurance. She declined when leaving the hospital going to a facility, they tried.   Reviewed notes, labs and imaging from outside physicians, which showed:  I reviewed Dr. Rockne Menghini Chandra's notes.  Apparently a CT scan done in the hospital was abnormal and doctor suggested that she see a neurologist.  The last time Dr. Loreen Freud saw her was in August at which time he was ruling out vascular causes for venous hyperpigmentation and hyperkeratotic lesions of the tips of her big toes bilaterally, more likely a callus than lesion caused by vascular insufficiency, she has been diagnosed with senile dementia in the past without behavioral disturbance, he is recommended that she not drive because of the degree of her dementia.  She is on aspirin 81 mg, sertraline, donepezil.  Examination showed a normal, alert, general appearance, extraocular movements intact, neck thyroid normal, heart with normal rate rhythm S1-S2 normal, lungs good air movement, chest normal, abdomen normal, nonfocal neurologic exam cranial nerves II through XII grossly intact, alert and oriented, Babinski absent.  She is incontinent and uses a adult diaper, she is also had nontraumatic rhabdomyolysis, abnormal liver function tests, renal insufficiency, hyperlipidemia.  I reviewed CT of the head completed July 2020 after patient being found down for several days with resultant rhabdomyolysis and renal insufficiency, CT of the head showed mild chronic microvascular ischemic changes, atrophy, no acute findings.  I could not review the images, I reviewed  the report.   TSH nml 08/2018   Review of Systems: Patient complains of symptoms per HPI as well as the following symptoms: joint pain, swelling, murmur. Pertinent negatives and positives per HPI. All others negative  REVIEW OF SYSTEMS: Out of a complete 14 system review of symptoms, the patient complains only of the following  symptoms, and all other reviewed systems are negative.  ALLERGIES: Allergies  Allergen Reactions  . Codeine Nausea And Vomiting and Other (See Comments)    Dizziness    HOME MEDICATIONS: Outpatient Medications Prior to Visit  Medication Sig Dispense Refill  . aspirin EC 81 MG tablet Take 81 mg by mouth daily.    . calcium-vitamin D (OSCAL WITH D) 500-200 MG-UNIT per tablet Take 1 tablet by mouth daily.    Marland Kitchen donepezil (ARICEPT) 10 MG tablet Take 10 mg by mouth at bedtime.  4  . hydrochlorothiazide (HYDRODIURIL) 25 MG tablet Take 25 mg by mouth daily.    . mirabegron ER (MYRBETRIQ) 25 MG TB24 tablet Take 25 mg by mouth daily.    Marland Kitchen oxybutynin (DITROPAN-XL) 10 MG 24 hr tablet Take 10 mg by mouth daily.    . rosuvastatin (CRESTOR) 20 MG tablet Take 20 mg by mouth daily.    . sertraline (ZOLOFT) 100 MG tablet Take 100 mg by mouth daily.   3   No facility-administered medications prior to visit.    PAST MEDICAL HISTORY: Past Medical History:  Diagnosis Date  . Aortic stenosis   . Atrial fibrillation ([place])   . Carotid artery bruit 07/22/2012  . Chest pain    myoview 04/24/07-mild-mod perfusion defect with mild-mod superimposed ischemiain the mid inferior, apicla inferior, basal inferolateral and mid inferolateral regions  . Community acquired pneumonia 01/13/2013  . Edema   . Gallstone   . Generalized osteoarthritis   . HTN (hypertension)   . Hyperlipidemia   . Hypochloremia 01/13/2013  . Hyponatremia 01/13/2013  . IFG (impaired fasting glucose)   . Mild neurocognitive disorder 02/26/2019  . Nonspecific abnormal electrocardiogram (ECG) (EKG)   . Normocytic anemia 08/26/2018  . Porokeratosis 09/02/2018  . Premature atrial contractions 07/22/2012  . Renal insufficiency 08/26/2018  . Rhabdomyolysis 08/26/2018  . Rheumatoid arthritis ([place]) 01/13/2013  . Vertigo    as late effect of cerebrovascular disease    PAST SURGICAL HISTORY: Past Surgical History:  Procedure Laterality Date  .  APPENDECTOMY    . BUNIONECTOMY Bilateral   . cataract surgery Bilateral 05/2016  . gallstones removal    . TONSILLECTOMY      FAMILY HISTORY: Family History  Problem Relation Age of Onset  . Diabetes Father   . Seizures Brother   . Alzheimer's disease Sister   . Cancer Sister   . Breast cancer Sister   . Cancer Sister   . Seizures Daughter   . Seizures Son   . Seizures Granddaughter   . Ovarian cancer Granddaughter   . Lung cancer Nephew   . Breast cancer Niece   . Breast cancer Niece   . Brain cancer Niece     SOCIAL HISTORY: Social History   Socioeconomic History  . Marital status: Divorced    Spouse name: Not on file  . Number of children: 4  . Years of education: 78  . Highest education level: Some college, no degree  Occupational History  . Not on file  Tobacco Use  . Smoking status: Never Smoker  . Smokeless tobacco: Never Used  Substance and Sexual Activity  . Alcohol  use: No  . Drug use: No  . Sexual activity: Never  Other Topics Concern  . Not on file  Social History Narrative   Lives at home alone   Right handed   Caffeine: sometimes   Social Determinants of Health   Financial Resource Strain:   . Difficulty of Paying Living Expenses: Not on file  Food Insecurity:   . Worried About Charity fundraiser in the Last Year: Not on file  . Ran Out of Food in the Last Year: Not on file  Transportation Needs:   . Lack of Transportation (Medical): Not on file  . Lack of Transportation (Non-Medical): Not on file  Physical Activity:   . Days of Exercise per Week: Not on file  . Minutes of Exercise per Session: Not on file  Stress:   . Feeling of Stress : Not on file  Social Connections:   . Frequency of Communication with Friends and Family: Not on file  . Frequency of Social Gatherings with Friends and Family: Not on file  . Attends Religious Services: Not on file  . Active Member of Clubs or Organizations: Not on file  . Attends Theatre manager Meetings: Not on file  . Marital Status: Not on file  Intimate Partner Violence:   . Fear of Current or Ex-Partner: Not on file  . Emotionally Abused: Not on file  . Physically Abused: Not on file  . Sexually Abused: Not on file      PHYSICAL EXAM  Vitals:   04/23/19 0919  BP: (!) 175/69  Pulse: (!) 51  Weight: 172 lb (78 kg)  Height: 5\' 6"  (1.676 m)   Body mass index is 27.76 kg/m.  Generalized: Well developed, in no acute distress  Cardiology: normal rate and rhythm, murmur noted Neurological examination  Mentation: Alert oriented to time, place, history taking. Follows all commands speech and language fluent Cranial nerve II-XII: Pupils were equal round reactive to light. Extraocular movements were full, visual field were full on confrontational test. Facial sensation and strength were normal. Uvula tongue midline. Head turning and shoulder shrug  were normal and symmetric. Motor: The motor testing reveals 5 over 5 strength of all 4 extremities. Good symmetric motor tone is noted throughout.  Sensory: Sensory testing is intact to soft touch on all 4 extremities. No evidence of extinction is noted.  Coordination: Cerebellar testing reveals good finger-nose-finger and heel-to-shin bilaterally.  Gait and station: Gait is short but stable with single prong cane     DIAGNOSTIC DATA (LABS, IMAGING, TESTING) - I reviewed patient records, labs, notes, testing and imaging myself where available.  MMSE - Mini Mental State Exam 04/23/2019 12/16/2018  Orientation to time 5 5  Orientation to Place 4 4  Registration 3 3  Attention/ Calculation 5 5  Attention/Calculation-comments refused serial 7s -  Recall 2 2  Language- name 2 objects 2 2  Language- repeat 1 1  Language- follow 3 step command 3 3  Language- read & follow direction 1 1  Write a sentence 1 1  Copy design 1 1  Total score 28 28     Lab Results  Component Value Date   WBC 7.7 12/16/2018   HGB 10.6  (L) 12/16/2018   HCT 33.2 (L) 12/16/2018   MCV 92 12/16/2018   PLT 282 12/16/2018      Component Value Date/Time   NA 139 12/16/2018 0903   K 3.6 12/16/2018 0903   CL 101 12/16/2018 0903  CO2 25 12/16/2018 0903   GLUCOSE 99 12/16/2018 0903   GLUCOSE 120 (H) 08/28/2018 0343   BUN 27 12/16/2018 0903   CREATININE 1.00 12/16/2018 0903   CALCIUM 9.8 12/16/2018 0903   PROT 7.3 12/16/2018 0903   ALBUMIN 4.1 12/16/2018 0903   AST 21 12/16/2018 0903   ALT 13 12/16/2018 0903   ALKPHOS 48 12/16/2018 0903   BILITOT 0.3 12/16/2018 0903   GFRNONAA 50 (L) 12/16/2018 0903   GFRAA 58 (L) 12/16/2018 0903   Lab Results  Component Value Date   CHOL  04/03/2007    179        ATP III CLASSIFICATION:  <200     mg/dL   Desirable  200-239  mg/dL   Borderline High  >=240    mg/dL   High   HDL 46 04/03/2007   LDLCALC (H) 04/03/2007    117        Total Cholesterol/HDL:CHD Risk Coronary Heart Disease Risk Table                     Men   Women  1/2 Average Risk   3.4   3.3   TRIG 79 04/03/2007   CHOLHDL 3.9 04/03/2007   No results found for: HGBA1C Lab Results  Component Value Date   VITAMINB12 248 12/16/2018   Lab Results  Component Value Date   TSH 1.606 08/26/2018       ASSESSMENT AND PLAN 86 y.o. year old female  has a past medical history of Aortic stenosis, Atrial fibrillation ([place]), Carotid artery bruit (07/22/2012), Chest pain, Community acquired pneumonia (01/13/2013), Edema, Gallstone, Generalized osteoarthritis, HTN (hypertension), Hyperlipidemia, Hypochloremia (01/13/2013), Hyponatremia (01/13/2013), IFG (impaired fasting glucose), Mild neurocognitive disorder (02/26/2019), Nonspecific abnormal electrocardiogram (ECG) (EKG), Normocytic anemia (08/26/2018), Porokeratosis (09/02/2018), Premature atrial contractions (07/22/2012), Renal insufficiency (08/26/2018), Rhabdomyolysis (08/26/2018), Rheumatoid arthritis ([place]) (01/13/2013), and Vertigo. here with     ICD-10-CM   1. Mild cognitive  impairment with memory loss  G31.84 memantine (NAMENDA) 5 MG tablet    [name] [name] is doing fairly well today.  Formal neurocognitive testing did reveal a moderate level of mild cognitive impairment.  She has tolerated Aricept well.  We will add Namenda 5 mg twice daily.  She will start with daily dosing for 2 weeks then increase to twice daily dosing.  We have had a lengthy conversation regarding the need of safety precautions.  She is now wearing a life alert necklace.  I have educated her on the importance of keeping that necklace on at all times.  She is using a cane or walker for stabilization.  Fall precautions discussed.  I do recommend caution with driving.  Primary care has suggested no driving at this time and I have reiterated that I agree with this plan until reevaluated.  May consider daytime, local driving if she remains stable.  I have encouraged her to stay as physically active as possible.  Memory compensation strategies reviewed.  She will follow-up closely with primary care for management of comorbidities.  She will follow-up with me in 6 months, sooner if needed.  She verbalizes understanding and agreement with this plan.   No orders of the defined types were placed in this encounter.    Meds ordered this encounter  Medications  . memantine (NAMENDA) 5 MG tablet    Sig: Take 1 tablet (5 mg total) by mouth 2 (two) times daily.    Dispense:  60 tablet    Refill:  5  Order Specific Question:   Supervising Provider    Answer:   Melvenia Beam [[id number]]      I spent 15 minutes with the patient. 50% of this time was spent counseling and educating patient on plan of care and medications.    Debbora Presto, FNP-C 04/23/2019, 10:01 AM [facility] [address], [place], [place] [address] [phone]  Made any corrections needed, and agree with history, physical, neuro exam,assessment and plan as stated.     Sarina Ill, MD [facility]

## 2019-04-23 NOTE — Patient Instructions (Signed)
   Continue Aricept 20mg  daily  We will add Namenda 5mg  daily for two weeks, then increase to 5mg  twice daily   Stay as active as possible   Follow up closely with PCP   Follow up in 6 months   Memory Compensation Strategies  1. Use "WARM" strategy.  W= write it down  A= associate it  R= repeat it  M= make a mental note  2.   You can keep a Social worker.  Use a 3-ring notebook with sections for the following: calendar, important names and phone numbers,  medications, doctors' names/phone numbers, lists/reminders, and a section to journal what you did  each day.   3.    Use a calendar to write appointments down.  4.    Write yourself a schedule for the day.  This can be placed on the calendar or in a separate section of the Memory Notebook.  Keeping a  regular schedule can help memory.  5.    Use medication organizer with sections for each day or morning/evening pills.  You may need help loading it  6.    Keep a basket, or pegboard by the door.  Place items that you need to take out with you in the basket or on the pegboard.  You may also want to  include a message board for reminders.  7.    Use sticky notes.  Place sticky notes with reminders in a place where the task is performed.  For example: " turn off the  stove" placed by the stove, "lock the door" placed on the door at eye level, " take your medications" on  the bathroom mirror or by the place where you normally take your medications.  8.    Use alarms/timers.  Use while cooking to remind yourself to check on food or as a reminder to take your medicine, or as a  reminder to make a call, or as a reminder to perform another task, etc.

## 2019-04-26 NOTE — Progress Notes (Signed)
Subjective: 86 y.o. returns the office today for painful, elongated, thickened toenails which they cannot trim herself as well as for painful calluses to her feet.  She had a shorter appointment today because the calluses were very thick last appointment and more preulcerative.  Denies any redness or drainage or swelling to the nail or callus sites. No other concerns today.Denies any systemic complaints such as fevers, chills, nausea, vomiting.   Objective: AAO 3, NAD DP/PT pulses palpable, CRT less than 3 seconds Nails hypertrophic, dystrophic, elongated, brittle, discolored 10. There is tenderness overlying the nails 1-5 bilaterally. There is no surrounding erythema or drainage along the nail sites. Hyperkeratotic lesion to the plantar aspect of the right foot submetatarsal 1 and along the distal 3rd toes bilaterally. There is still dried blood under the calluses however much improved but there is no open lesions.  No open lesions or pre-ulcerative lesions are identified. No pain with calf compression, swelling, warmth, erythema.  Assessment: Patient presents with symptomatic onychomycosis; hyperkeratotic lesions; plantarflexed metatarsal  Plan: -Treatment options including alternatives, risks, complications were discussed -Nails sharply debrided 10 without complication/bleeding. -Hyperkeratotic lesion sharply debrided 4 without complications or bleeding.  Continue moisturizer and offloading. -Discussed daily foot inspection. If there are any changes, to call the office immediately.  -Follow-up in as scheduled or sooner if any problems are to arise. In the meantime, encouraged to call the office with any questions, concerns, changes symptoms.  Celesta Gentile, DPM

## 2019-05-25 ENCOUNTER — Ambulatory Visit
Admission: RE | Admit: 2019-05-25 | Discharge: 2019-05-25 | Disposition: A | Discharge status: Home / Self Care | Payer: PPO | Source: Ambulatory Visit | Attending: Internal Medicine | Admitting: Internal Medicine

## 2019-05-25 ENCOUNTER — Other Ambulatory Visit: Payer: Self-pay

## 2019-05-25 DIAGNOSIS — R921 Mammographic calcification found on diagnostic imaging of breast: Secondary | ICD-10-CM | POA: Diagnosis not present

## 2019-05-26 ENCOUNTER — Ambulatory Visit: Payer: PPO | Attending: Family

## 2019-05-26 DIAGNOSIS — Z23 Encounter for immunization: Secondary | ICD-10-CM

## 2019-05-26 NOTE — Progress Notes (Signed)
   Covid-19 Vaccination Clinic  Name:  Esbeydi Manago    MRN: [id number] DOB: 11-08-1928  05/26/2019  Ms. Peplinski was observed post Covid-19 immunization for 15 minutes without incident. She was provided with Vaccine Information Sheet and instruction to access the V-Safe system.   Ms. Cronce was instructed to call 911 with any severe reactions post vaccine: Marland Kitchen Difficulty breathing  . Swelling of face and throat  . A fast heartbeat  . A bad rash all over body  . Dizziness and weakness   Immunizations Administered    Name Date Dose VIS Date Route   Moderna COVID-19 Vaccine 05/26/2019 10:07 AM 0.5 mL 01/20/2019 Intramuscular   Manufacturer: Moderna   Lot: [id number]   [place]: [phone]

## 2019-06-02 DIAGNOSIS — E039 Hypothyroidism, unspecified: Secondary | ICD-10-CM | POA: Diagnosis not present

## 2019-06-02 DIAGNOSIS — I10 Essential (primary) hypertension: Secondary | ICD-10-CM | POA: Diagnosis not present

## 2019-06-02 DIAGNOSIS — I739 Peripheral vascular disease, unspecified: Secondary | ICD-10-CM | POA: Diagnosis not present

## 2019-06-02 DIAGNOSIS — R7301 Impaired fasting glucose: Secondary | ICD-10-CM | POA: Diagnosis not present

## 2019-06-09 DIAGNOSIS — I10 Essential (primary) hypertension: Secondary | ICD-10-CM | POA: Diagnosis not present

## 2019-06-09 DIAGNOSIS — R7301 Impaired fasting glucose: Secondary | ICD-10-CM | POA: Diagnosis not present

## 2019-06-09 DIAGNOSIS — E782 Mixed hyperlipidemia: Secondary | ICD-10-CM | POA: Diagnosis not present

## 2019-06-09 DIAGNOSIS — F0390 Unspecified dementia without behavioral disturbance: Secondary | ICD-10-CM | POA: Diagnosis not present

## 2019-06-18 ENCOUNTER — Other Ambulatory Visit: Payer: Self-pay

## 2019-06-18 ENCOUNTER — Ambulatory Visit: Payer: PPO | Admitting: Podiatry

## 2019-06-18 DIAGNOSIS — M79675 Pain in left toe(s): Secondary | ICD-10-CM

## 2019-06-18 DIAGNOSIS — Q828 Other specified congenital malformations of skin: Secondary | ICD-10-CM

## 2019-06-18 DIAGNOSIS — B351 Tinea unguium: Secondary | ICD-10-CM

## 2019-06-18 DIAGNOSIS — M79674 Pain in right toe(s): Secondary | ICD-10-CM

## 2019-06-18 NOTE — Progress Notes (Signed)
Subjective: 86 y.o. returns the office today for painful, elongated, thickened toenails which they cannot trim herself as well as for painful calluses to her feet.  No open lesions. Denies any redness or drainage or swelling to the nail or callus sites. No other concerns today.Denies any systemic complaints such as fevers, chills, nausea, vomiting.   Objective: AAO 3, NAD DP/PT pulses palpable, CRT less than 3 seconds Nails hypertrophic, dystrophic, elongated, brittle, discolored 10. There is tenderness overlying the nails 1-5 bilaterally. There is no surrounding erythema or drainage along the nail sites. Hyperkeratotic lesion to the plantar aspect of the right foot submetatarsal 1 and along the distal 3rd toes. There is still dried blood under the calluses however much improved but there is no open lesions.  No open lesions or pre-ulcerative lesions are identified. No pain with calf compression, swelling, warmth, erythema.  Assessment: Patient presents with symptomatic onychomycosis; hyperkeratotic lesions; plantarflexed metatarsal  Plan: -Treatment options including alternatives, risks, complications were discussed -Nails sharply debrided 10 without complication/bleeding. -Hyperkeratotic lesion sharply debrided without complications or bleeding.  Continue moisturizer and offloading. -Discussed daily foot inspection. If there are any changes, to call the office immediately.  -Follow-up in as scheduled or sooner if any problems are to arise. In the meantime, encouraged to call the office with any questions, concerns, changes symptoms.  Celesta Gentile, DPM

## 2019-07-06 DIAGNOSIS — F0390 Unspecified dementia without behavioral disturbance: Secondary | ICD-10-CM | POA: Diagnosis not present

## 2019-07-06 DIAGNOSIS — I739 Peripheral vascular disease, unspecified: Secondary | ICD-10-CM | POA: Diagnosis not present

## 2019-07-06 DIAGNOSIS — E782 Mixed hyperlipidemia: Secondary | ICD-10-CM | POA: Diagnosis not present

## 2019-07-06 DIAGNOSIS — I10 Essential (primary) hypertension: Secondary | ICD-10-CM | POA: Diagnosis not present

## 2019-08-20 ENCOUNTER — Ambulatory Visit: Payer: PPO | Admitting: Podiatry

## 2019-09-07 ENCOUNTER — Other Ambulatory Visit: Payer: Self-pay

## 2019-09-07 ENCOUNTER — Ambulatory Visit: Payer: PPO | Admitting: Podiatry

## 2019-09-07 DIAGNOSIS — B351 Tinea unguium: Secondary | ICD-10-CM

## 2019-09-07 DIAGNOSIS — M79675 Pain in left toe(s): Secondary | ICD-10-CM | POA: Diagnosis not present

## 2019-09-07 DIAGNOSIS — Q828 Other specified congenital malformations of skin: Secondary | ICD-10-CM

## 2019-09-07 DIAGNOSIS — M79674 Pain in right toe(s): Secondary | ICD-10-CM

## 2019-09-07 NOTE — Progress Notes (Signed)
Subjective: 86 y.o. returns the office today for painful, elongated, thickened toenails which they cannot trim herself as well as for painful calluses to her feet.  No open lesions. Denies any redness or drainage or swelling to the nail or callus sites. No other concerns today.Denies any systemic complaints such as fevers, chills, nausea, vomiting.   Objective: AAO 3, NAD DP/PT pulses palpable, CRT less than 3 seconds Nails hypertrophic, dystrophic, elongated, brittle, discolored 10. There is tenderness overlying the nails 1-5 bilaterally. There is no surrounding erythema or drainage along the nail sites. Hyperkeratotic lesion to the plantar aspect of the right foot submetatarsal 1 and along the distal 3rd toes. There is still dried blood under the calluses however much improved but there is no open lesions.  No open lesions or pre-ulcerative lesions are identified. No pain with calf compression, swelling, warmth, erythema.  Assessment: Patient presents with symptomatic onychomycosis; hyperkeratotic lesions; plantarflexed metatarsal  Plan: -Treatment options including alternatives, risks, complications were discussed -Nails sharply debrided 10 without complication/bleeding. -Hyperkeratotic lesion sharply debrided x2 without complications or bleeding.  Continue moisturizer and offloading. -Discussed daily foot inspection. If there are any changes, to call the office immediately.  -Follow-up in as scheduled or sooner if any problems are to arise. In the meantime, encouraged to call the office with any questions, concerns, changes symptoms.  Celesta Gentile, DPM

## 2019-10-28 ENCOUNTER — Encounter: Payer: Self-pay | Admitting: Family Medicine

## 2019-10-28 ENCOUNTER — Ambulatory Visit: Payer: PPO | Admitting: Family Medicine

## 2019-10-28 VITALS — BP 138/80 | HR 51 | Ht 66.0 in | Wt 186.2 lb

## 2019-10-28 DIAGNOSIS — G3184 Mild cognitive impairment, so stated: Secondary | ICD-10-CM

## 2019-10-28 MED ORDER — MEMANTINE HCL 10 MG PO TABS: 10.0000 mg | ORAL_TABLET | Freq: Two times a day (BID) | ORAL | 3 refills | Status: DC

## 2019-10-28 NOTE — Progress Notes (Signed)
PATIENT: [name] [name] [name] DOB: August 05, 1928  REASON FOR VISIT: follow up HISTORY FROM: patient  Chief Complaint  Patient presents with  . Follow-up    6 month f/u for memory. States she has been doing well since last visit.   Marland Kitchen room 3    with daughter     HISTORY OF PRESENT ILLNESS: Today 10/28/19 [name] [name] [name] is [name] [name] here today for follow up for MCI. She continues Aricept 10mg  daily and Namenda 5mg  BID.  She has tolerated these medications without obvious adverse effects. She feels memory is stable, no better and no worse. She continues to live in independent living. She is managing finances and her home. She is able to perform ADL's independently. She is able to manage her medications. Her daughter reports that she is very good about taking medications as prescribed. She continues to drive. Her PCP has ordered certain restrictions. She is allowed to drive daytime hours only. She is supposed to drive within [name] [name] 10 mile radius of her home. She denies accidents or getting lost. She did drive to our office today. She was late to her appt and reports that she got lost. She has not driven to our office by herself in the past.    HISTORY: (copied from my note on 04/23/2019)  [name] [name] [name] is [name] [name] here today for follow up for memory loss. She was evaluated by neuropsychology who felt that she had [name] [name] moderate level of MCI. She is living in independent living. She is now using [name] [name] life alert necklace.  She did have one small trip since last being seen but fortunately there were no injuries.  Family initiated life alert following that fall.  She does live alone.  Her daughter comes by to see her at least 3-4 times [name] [name] week.  Someone calls to check on her every day.  She is not driving at this time.  She is able to dose her own medications.  She is able to perform all activities of daily living independently.  HISTORY: (copied from Dr Cathren Laine note on 12/16/2018)  [name] [name] [name]  86 y.o.femalehere as requested by Merrilee Seashore, MDfor dementia. Past medical history "senile dementia, without behavioral disturbances".PMHx afib, HTN, Rhabdo, RA, renal insufficiency. Here with her daughter who provides most information. Patient lives alone. She had fallen and in her apartment for 6 days, unsure why, they found her in [place] and some sores.Her sister has dementia, patient denies she has dementia. Daughter noticed changes 3-4 years ago, slowly progressive, patient is irritable, agitated, patient blames daughter, she accused daughter of taking her blow dryer (it was found), she loses things, started with short-term memory loss, losing things, repeating the same stories over and over, forgetting appointments and losing hr wallet and other items. She takes her own medications. Not missing any bills, paying all the bills. Doctor stopped her from driving. She is on Aricept. She is weaker due to the fall. She has to walk with [name] [name] cane. They used tohave [name] [name] nurse come in from insurance. She declined when leaving the hospital going to [name] [name] facility, they tried.  Reviewed notes, labs and imaging from outside physicians, which showed:  I reviewed Dr. Rockne Menghini Chandra's notes. Apparently [name] [name] CT scan done in the hospital was abnormal and doctor suggested that she see [name] [name] neurologist. The last time Dr. Loreen Freud saw her was in August at which time he was ruling out vascular causes for venous hyperpigmentation and hyperkeratotic lesions of  the tips of her big toes bilaterally, more likely [name] [name] callus than lesion caused by vascular insufficiency, she has been diagnosed with senile dementia in the past without behavioral disturbance, he is recommended that she not drive because of the degree of her dementia. She is on aspirin 81 mg, sertraline, donepezil. Examination showed [name] [name] normal, alert, general appearance, extraocular movements intact, neck thyroid normal, heart with normal rate rhythm S1-S2 normal,  lungs good air movement, chest normal, abdomen normal, nonfocal neurologic exam cranial nerves II through XII grossly intact, alert and oriented, Babinski absent. She is incontinent and uses [name] [name] adult diaper, she is also had nontraumatic rhabdomyolysis, abnormal liver function tests, renal insufficiency, hyperlipidemia. I reviewed CT of the head completed July 2020 after patient being found down for several days with resultant rhabdomyolysis and renal insufficiency, CT of the head showed mild chronic microvascular ischemic changes, atrophy, no acute findings. I could not review the images, I reviewed the report.   TSH nml 08/2018   REVIEW OF SYSTEMS: Out of [name] [name] complete 14 system review of symptoms, the patient complains only of the following symptoms, none and all other reviewed systems are negative.   ALLERGIES: Allergies  Allergen Reactions  . Codeine Nausea And Vomiting and Other (See Comments)    Dizziness    HOME MEDICATIONS: Outpatient Medications Prior to Visit  Medication Sig Dispense Refill  . aspirin EC 81 MG tablet Take 81 mg by mouth daily.    . calcium-vitamin D (OSCAL WITH D) 500-200 MG-UNIT per tablet Take 1 tablet by mouth daily.    Marland Kitchen donepezil (ARICEPT) 10 MG tablet Take 10 mg by mouth at bedtime.  4  . hydrochlorothiazide (HYDRODIURIL) 25 MG tablet Take 25 mg by mouth daily.    . mirabegron ER (MYRBETRIQ) 25 MG TB24 tablet Take 25 mg by mouth daily.    Marland Kitchen oxybutynin (DITROPAN-XL) 10 MG 24 hr tablet Take 10 mg by mouth daily.    . rosuvastatin (CRESTOR) 20 MG tablet Take 20 mg by mouth daily.    . sertraline (ZOLOFT) 100 MG tablet Take 100 mg by mouth daily.   3  . memantine (NAMENDA) 5 MG tablet Take 1 tablet (5 mg total) by mouth 2 (two) times daily. 60 tablet 5   No facility-administered medications prior to visit.    PAST MEDICAL HISTORY: Past Medical History:  Diagnosis Date  . Aortic stenosis   . Atrial fibrillation ([place])   . Carotid artery bruit 07/22/2012   . Chest pain    myoview 04/24/07-mild-mod perfusion defect with mild-mod superimposed ischemiain the mid inferior, apicla inferior, basal inferolateral and mid inferolateral regions  . Community acquired pneumonia 01/13/2013  . Edema   . Gallstone   . Generalized osteoarthritis   . HTN (hypertension)   . Hyperlipidemia   . Hypochloremia 01/13/2013  . Hyponatremia 01/13/2013  . IFG (impaired fasting glucose)   . Mild neurocognitive disorder 02/26/2019  . Nonspecific abnormal electrocardiogram (ECG) (EKG)   . Normocytic anemia 08/26/2018  . Porokeratosis 09/02/2018  . Premature atrial contractions 07/22/2012  . Renal insufficiency 08/26/2018  . Rhabdomyolysis 08/26/2018  . Rheumatoid arthritis ([place]) 01/13/2013  . Vertigo    as late effect of cerebrovascular disease    PAST SURGICAL HISTORY: Past Surgical History:  Procedure Laterality Date  . APPENDECTOMY    . BUNIONECTOMY Bilateral   . cataract surgery Bilateral 05/2016  . gallstones removal    . TONSILLECTOMY      FAMILY HISTORY: Family History  Problem Relation  Age of Onset  . Diabetes Father   . Seizures Brother   . Alzheimer's disease Sister   . Cancer Sister   . Breast cancer Sister   . Cancer Sister   . Seizures Daughter   . Seizures Son   . Seizures Granddaughter   . Ovarian cancer Granddaughter   . Lung cancer Nephew   . Breast cancer Niece   . Breast cancer Niece   . Brain cancer Niece     SOCIAL HISTORY: Social History   Socioeconomic History  . Marital status: Divorced    Spouse name: Not on file  . Number of children: 4  . Years of education: 64  . Highest education level: Some college, no degree  Occupational History  . Not on file  Tobacco Use  . Smoking status: Never Smoker  . Smokeless tobacco: Never Used  Vaping Use  . Vaping Use: Never used  Substance and Sexual Activity  . Alcohol use: No  . Drug use: No  . Sexual activity: Never  Other Topics Concern  . Not on file  Social History  Narrative   Lives at home alone   Right handed   Caffeine: sometimes   Social Determinants of Health   Financial Resource Strain:   . Difficulty of Paying Living Expenses: Not on file  Food Insecurity:   . Worried About Charity fundraiser in the Last Year: Not on file  . Ran Out of Food in the Last Year: Not on file  Transportation Needs:   . Lack of Transportation (Medical): Not on file  . Lack of Transportation (Non-Medical): Not on file  Physical Activity:   . Days of Exercise per Week: Not on file  . Minutes of Exercise per Session: Not on file  Stress:   . Feeling of Stress : Not on file  Social Connections:   . Frequency of Communication with Friends and Family: Not on file  . Frequency of Social Gatherings with Friends and Family: Not on file  . Attends Religious Services: Not on file  . Active Member of Clubs or Organizations: Not on file  . Attends Archivist Meetings: Not on file  . Marital Status: Not on file  Intimate Partner Violence:   . Fear of Current or Ex-Partner: Not on file  . Emotionally Abused: Not on file  . Physically Abused: Not on file  . Sexually Abused: Not on file      PHYSICAL EXAM  Vitals:   10/28/19 0955 10/28/19 1033  BP: (!) 184/75 138/80  Pulse: (!) 51   Weight: 186 lb 3.2 oz (84.5 kg)   Height: 5\' 6"  (1.676 m)    Body mass index is 30.05 kg/m.  Generalized: Well developed, in no acute distress  Cardiology: normal rate and rhythm, no murmur noted Respiratory: clear to auscultation bilaterally  Neurological examination  Mentation: Alert oriented to time, place, history taking. Follows all commands speech and language fluent Cranial nerve II-XII: Pupils were equal round reactive to light. Extraocular movements were full, visual field were full  Motor: The motor testing reveals 5 over 5 strength of all 4 extremities. Good symmetric motor tone is noted throughout.  Gait and station: Gait is normal.    DIAGNOSTIC DATA  (LABS, IMAGING, TESTING) - I reviewed patient records, labs, notes, testing and imaging myself where available.  MMSE - Mini Mental State Exam 10/28/2019 10/28/2019 04/23/2019  Orientation to time 5 5 5   Orientation to Place 3 3 4  Registration 3 3 3   Attention/ Calculation 5 5 5   Attention/Calculation-comments - - refused serial 7s  Recall 1 1 2   Language- name 2 objects 2 2 2   Language- repeat 1 1 1   Language- follow 3 step command 3 3 3   Language- read & follow direction 1 1 1   Write [name] [name] sentence 1 1 1   Copy design 1 1 1   Total score 26 26 28      Lab Results  Component Value Date   WBC 7.7 12/16/2018   HGB 10.6 (L) 12/16/2018   HCT 33.2 (L) 12/16/2018   MCV 92 12/16/2018   PLT 282 12/16/2018      Component Value Date/Time   NA 139 12/16/2018 0903   K 3.6 12/16/2018 0903   CL 101 12/16/2018 0903   CO2 25 12/16/2018 0903   GLUCOSE 99 12/16/2018 0903   GLUCOSE 120 (H) 08/28/2018 0343   BUN 27 12/16/2018 0903   CREATININE 1.00 12/16/2018 0903   CALCIUM 9.8 12/16/2018 0903   PROT 7.3 12/16/2018 0903   ALBUMIN 4.1 12/16/2018 0903   AST 21 12/16/2018 0903   ALT 13 12/16/2018 0903   ALKPHOS 48 12/16/2018 0903   BILITOT 0.3 12/16/2018 0903   GFRNONAA 50 (L) 12/16/2018 0903   GFRAA 58 (L) 12/16/2018 0903   Lab Results  Component Value Date   CHOL  04/03/2007    179        ATP III CLASSIFICATION:  <200     mg/dL   Desirable  200-239  mg/dL   Borderline High  >=240    mg/dL   High   HDL 46 04/03/2007   LDLCALC (H) 04/03/2007    117        Total Cholesterol/HDL:CHD Risk Coronary Heart Disease Risk Table                     Men   Women  1/2 Average Risk   3.4   3.3   TRIG 79 04/03/2007   CHOLHDL 3.9 04/03/2007   No results found for: HGBA1C Lab Results  Component Value Date   VITAMINB12 248 12/16/2018   Lab Results  Component Value Date   TSH 1.606 08/26/2018       ASSESSMENT AND PLAN 86 y.o. year old female  has [name] [name] past medical history of Aortic stenosis,  Atrial fibrillation ([place]), Carotid artery bruit (07/22/2012), Chest pain, Community acquired pneumonia (01/13/2013), Edema, Gallstone, Generalized osteoarthritis, HTN (hypertension), Hyperlipidemia, Hypochloremia (01/13/2013), Hyponatremia (01/13/2013), IFG (impaired fasting glucose), Mild neurocognitive disorder (02/26/2019), Nonspecific abnormal electrocardiogram (ECG) (EKG), Normocytic anemia (08/26/2018), Porokeratosis (09/02/2018), Premature atrial contractions (07/22/2012), Renal insufficiency (08/26/2018), Rhabdomyolysis (08/26/2018), Rheumatoid arthritis ([place]) (01/13/2013), and Vertigo. here with     ICD-10-CM   1. Mild cognitive impairment with memory loss  G31.84 memantine (NAMENDA) 10 MG tablet    [name] [name] [name] is doing fairly well. She has tolerated Aricept and Namenda. We will continue Aricept 10mg  daily and increase Namenda to 10mg  twice daily. She was educated on appropriate administration and possible side effects. Her daughter will monitor medications for the next two weeks to ensure dosing is correct. We have had [name] [name] lengthy discussion regarding safety precautions. She will continue to use her life alert necklace. She was advised to abide by driving restrictions placed by PCP. She was advised to drive during the daylight hours only and local driving. She was encouraged to avoid highways and high traffic areas. Memory compensation strategies reviewed. She will stay active and well hydrated.  She will follow up closely with PCP. Initial BP was 184/75. I suspect this was related to patient getting lost and being late to her appt. Recheck BP 138/80. She will follow up with PCP regularly. She will return to see me in 4 months.    No orders of the defined types were placed in this encounter.    Meds ordered this encounter  Medications  . memantine (NAMENDA) 10 MG tablet    Sig: Take 1 tablet (10 mg total) by mouth 2 (two) times daily.    Dispense:  180 tablet    Refill:  3    Order Specific Question:    Supervising Provider    Answer:   Melvenia Beam [id number]      I spent 30 minutes with the patient. 50% of this time was spent counseling and educating patient on plan of care and medications.    Debbora Presto, FNP-C 10/28/2019, 12:41 PM [facility] [address] [place], [place] [address] [phone]

## 2019-10-28 NOTE — Patient Instructions (Addendum)
  We will increase Namenda (memantine) to 10mg  twice daily. Continue Aricept (donepezil) 10mg  daily.   Please abide by driving restriction placed by PCP. Daytime driving only. Stay within 10 mile radius. Avoid highways and traffic.   Follow up with me in 4-6 months    Memory Compensation Strategies  1. Use "WARM" strategy.  W= write it down  A= associate it  R= repeat it  M= make a mental note  2.   You can keep a Social worker.  Use a 3-ring notebook with sections for the following: calendar, important names and phone numbers,  medications, doctors' names/phone numbers, lists/reminders, and a section to journal what you did  each day.   3.    Use a calendar to write appointments down.  4.    Write yourself a schedule for the day.  This can be placed on the calendar or in a separate section of the Memory Notebook.  Keeping a  regular schedule can help memory.  5.    Use medication organizer with sections for each day or morning/evening pills.  You may need help loading it  6.    Keep a basket, or pegboard by the door.  Place items that you need to take out with you in the basket or on the pegboard.  You may also want to  include a message board for reminders.  7.    Use sticky notes.  Place sticky notes with reminders in a place where the task is performed.  For example: " turn off the  stove" placed by the stove, "lock the door" placed on the door at eye level, " take your medications" on  the bathroom mirror or by the place where you normally take your medications.  8.    Use alarms/timers.  Use while cooking to remind yourself to check on food or as a reminder to take your medicine, or as a  reminder to make a call, or as a reminder to perform another task, etc.

## 2019-11-09 ENCOUNTER — Other Ambulatory Visit: Payer: Self-pay

## 2019-11-09 ENCOUNTER — Ambulatory Visit: Payer: PPO | Admitting: Podiatry

## 2019-11-09 DIAGNOSIS — M79675 Pain in left toe(s): Secondary | ICD-10-CM | POA: Diagnosis not present

## 2019-11-09 DIAGNOSIS — Q828 Other specified congenital malformations of skin: Secondary | ICD-10-CM

## 2019-11-09 DIAGNOSIS — B351 Tinea unguium: Secondary | ICD-10-CM

## 2019-11-09 DIAGNOSIS — M79674 Pain in right toe(s): Secondary | ICD-10-CM

## 2019-11-11 NOTE — Progress Notes (Signed)
Subjective: 86 y.o. returns the office today for painful, elongated, thickened toenails which they cannot trim herself and for painful calluses to both feet.  No open lesions. Denies any redness or drainage or swelling to the nail or callus sites. No other concerns today.Denies any systemic complaints such as fevers, chills, nausea, vomiting.   Objective: AAO 3, NAD DP/PT pulses palpable, CRT less than 3 seconds Nails hypertrophic, dystrophic, elongated, brittle, discolored 10. There is tenderness overlying the nails 1-5 bilaterally. There is no surrounding erythema or drainage along the nail sites. Hyperkeratotic lesion to the plantar aspect of the right foot submetatarsal 1 and along the distal 3rd toes. There is still dried blood under the calluses however much improved but there is no open lesions.  No open lesions or pre-ulcerative lesions are identified. No pain with calf compression, swelling, warmth, erythema.  Assessment: Patient presents with symptomatic onychomycosis; hyperkeratotic lesions  Plan: -Treatment options including alternatives, risks, complications were discussed -Nails sharply debrided 10 without complication/bleeding. -Hyperkeratotic lesion sharply debrided x2 without complications or bleeding.  Continue moisturizer and offloading. -Discussed daily foot inspection. If there are any changes, to call the office immediately.  -Follow-up in as scheduled or sooner if any problems are to arise. In the meantime, encouraged to call the office with any questions, concerns, changes symptoms.  Celesta Gentile, DPM

## 2019-12-15 DIAGNOSIS — Z20822 Contact with and (suspected) exposure to covid-19: Secondary | ICD-10-CM | POA: Diagnosis not present

## 2019-12-16 ENCOUNTER — Emergency Department (HOSPITAL_COMMUNITY): Payer: PPO

## 2019-12-16 ENCOUNTER — Other Ambulatory Visit: Payer: Self-pay

## 2019-12-16 ENCOUNTER — Emergency Department (HOSPITAL_COMMUNITY)
Admission: EM | Admit: 2019-12-16 | Discharge: 2019-12-17 | Disposition: A | Discharge status: Home / Self Care | Payer: PPO | Attending: Emergency Medicine | Admitting: Emergency Medicine

## 2019-12-16 ENCOUNTER — Encounter (HOSPITAL_COMMUNITY): Payer: Self-pay

## 2019-12-16 DIAGNOSIS — Z7982 Long term (current) use of aspirin: Secondary | ICD-10-CM | POA: Diagnosis not present

## 2019-12-16 DIAGNOSIS — Z79899 Other long term (current) drug therapy: Secondary | ICD-10-CM | POA: Diagnosis not present

## 2019-12-16 DIAGNOSIS — R110 Nausea: Secondary | ICD-10-CM | POA: Diagnosis not present

## 2019-12-16 DIAGNOSIS — F0390 Unspecified dementia without behavioral disturbance: Secondary | ICD-10-CM | POA: Insufficient documentation

## 2019-12-16 DIAGNOSIS — I10 Essential (primary) hypertension: Secondary | ICD-10-CM | POA: Diagnosis not present

## 2019-12-16 DIAGNOSIS — I517 Cardiomegaly: Secondary | ICD-10-CM | POA: Diagnosis not present

## 2019-12-16 DIAGNOSIS — R21 Rash and other nonspecific skin eruption: Secondary | ICD-10-CM | POA: Diagnosis not present

## 2019-12-16 DIAGNOSIS — J9811 Atelectasis: Secondary | ICD-10-CM | POA: Diagnosis not present

## 2019-12-16 DIAGNOSIS — U071 COVID-19: Secondary | ICD-10-CM | POA: Diagnosis not present

## 2019-12-16 DIAGNOSIS — R519 Headache, unspecified: Secondary | ICD-10-CM | POA: Diagnosis not present

## 2019-12-16 LAB — COMPREHENSIVE METABOLIC PANEL
ALT: 34 U/L (ref 0–44)
AST: 44 U/L — ABNORMAL HIGH (ref 15–41)
Albumin: 2.9 g/dL — ABNORMAL LOW (ref 3.5–5.0)
Alkaline Phosphatase: 34 U/L — ABNORMAL LOW (ref 38–126)
Anion gap: 11 (ref 5–15)
BUN: 46 mg/dL — ABNORMAL HIGH (ref 8–23)
CO2: 24 mmol/L (ref 22–32)
Calcium: 8.7 mg/dL — ABNORMAL LOW (ref 8.9–10.3)
Chloride: 100 mmol/L (ref 98–111)
Creatinine, Ser: 1.26 mg/dL — ABNORMAL HIGH (ref 0.44–1.00)
GFR, Estimated: 41 mL/min — ABNORMAL LOW (ref >60)
Glucose, Bld: 127 mg/dL — ABNORMAL HIGH (ref 70–99)
Potassium: 3.1 mmol/L — ABNORMAL LOW (ref 3.5–5.1)
Sodium: 135 mmol/L (ref 135–145)
Total Bilirubin: 1.1 mg/dL (ref 0.3–1.2)
Total Protein: 6.3 g/dL — ABNORMAL LOW (ref 6.5–8.1)

## 2019-12-16 LAB — CBC WITH DIFFERENTIAL/PLATELET
Abs Immature Granulocytes: 0.19 10*3/uL — ABNORMAL HIGH (ref 0.00–0.07)
Basophils Absolute: 0 10*3/uL (ref 0.0–0.1)
Basophils Relative: 0 %
Eosinophils Absolute: 0 10*3/uL (ref 0.0–0.5)
Eosinophils Relative: 0 %
HCT: 36.7 % (ref 36.0–46.0)
Hemoglobin: 11.6 g/dL — ABNORMAL LOW (ref 12.0–15.0)
Immature Granulocytes: 2 %
Lymphocytes Relative: 15 %
Lymphs Abs: 1.5 10*3/uL (ref 0.7–4.0)
MCH: 30 pg (ref 26.0–34.0)
MCHC: 31.6 g/dL (ref 30.0–36.0)
MCV: 94.8 fL (ref 80.0–100.0)
Monocytes Absolute: 1.1 10*3/uL — ABNORMAL HIGH (ref 0.1–1.0)
Monocytes Relative: 11 %
Neutro Abs: 6.8 10*3/uL (ref 1.7–7.7)
Neutrophils Relative %: 72 %
Platelets: 243 10*3/uL (ref 150–400)
RBC: 3.87 MIL/uL (ref 3.87–5.11)
RDW: 14.2 % (ref 11.5–15.5)
WBC: 9.6 10*3/uL (ref 4.0–10.5)
nRBC: 0 % (ref 0.0–0.2)

## 2019-12-16 LAB — RESPIRATORY PANEL BY RT PCR (FLU A&B, COVID)
Influenza A by PCR: NEGATIVE
Influenza B by PCR: NEGATIVE
SARS Coronavirus 2 by RT PCR: POSITIVE — AB

## 2019-12-16 MED ORDER — SODIUM CHLORIDE 0.9 % IV BOLUS: 1000.0000 mL | Freq: Once | INTRAVENOUS | Status: DC

## 2019-12-16 MED ORDER — POTASSIUM CHLORIDE CRYS ER 20 MEQ PO TBCR
40.0000 meq | EXTENDED_RELEASE_TABLET | Freq: Once | ORAL | Status: AC
  Administered 2019-12-16: 40 meq via ORAL
  Filled 2019-12-16: qty 2

## 2019-12-16 MED ORDER — ACETAMINOPHEN 325 MG PO TABS
650.0000 mg | ORAL_TABLET | Freq: Once | ORAL | Status: AC
  Administered 2019-12-16: 650 mg via ORAL
  Filled 2019-12-16: qty 2

## 2019-12-16 MED ORDER — SODIUM CHLORIDE 0.9 % IV BOLUS: 500.0000 mL | Freq: Once | INTRAVENOUS | Status: DC

## 2019-12-16 MED ORDER — ONDANSETRON 4 MG PO TBDP
4.0000 mg | ORAL_TABLET | Freq: Once | ORAL | Status: AC
  Administered 2019-12-16: 4 mg via ORAL
  Filled 2019-12-16: qty 1

## 2019-12-16 NOTE — ED Triage Notes (Signed)
Pt tested positive for covid yesterday at [facility]. Pt denies any complaints, daughter reports her o2 sats were 70%, cough with rattling in her chest

## 2019-12-16 NOTE — ED Provider Notes (Signed)
[facility] EMERGENCY DEPARTMENT Provider Note   CSN: [id number] Arrival date & time: 12/16/19  1806     History Chief Complaint  Patient presents with  . covid    [name] [name] is a 86 y.o. female.  HPI   86 year old female with history of aortic stenosis, atrial fibrillation, carotid artery bruit, hypertension, hyperlipidemia, dementia, who presents to the ED today for eval of COVID. Pt states that she has been symptomatic with COVID for the last 4 days. She has had a cough, congestion, nausea. Denies chest pain, shortness of breath, vomiting, diarrhea, abd pain. States she had a fall 4 days ago because her legs gave out. Denies any head trauma or LOC.   10:41 PM Discussed case with patients daughter. She does confirm that the patient fell 4 days ago. She is not sure if the patient sustained any trauma. States that pt was diagnosed with covid recently and she has been checking her pulse ox. She noted that the patients oxygen dropped to 71% so she sent the patient to the ED.   Past Medical History:  Diagnosis Date  . Aortic stenosis   . Atrial fibrillation ([place])   . Carotid artery bruit 07/22/2012  . Chest pain    myoview 04/24/07-mild-mod perfusion defect with mild-mod superimposed ischemiain the mid inferior, apicla inferior, basal inferolateral and mid inferolateral regions  . Community acquired pneumonia 01/13/2013  . Edema   . Gallstone   . Generalized osteoarthritis   . HTN (hypertension)   . Hyperlipidemia   . Hypochloremia 01/13/2013  . Hyponatremia 01/13/2013  . IFG (impaired fasting glucose)   . Mild neurocognitive disorder 02/26/2019  . Nonspecific abnormal electrocardiogram (ECG) (EKG)   . Normocytic anemia 08/26/2018  . Porokeratosis 09/02/2018  . Premature atrial contractions 07/22/2012  . Renal insufficiency 08/26/2018  . Rhabdomyolysis 08/26/2018  . Rheumatoid arthritis ([place]) 01/13/2013  . Vertigo    as late effect of cerebrovascular disease     Patient Active Problem List   Diagnosis Date Noted  . Slow heart rate 03/13/2019  . Mild neurocognitive disorder 02/26/2019  . Clavi 01/20/2019  . Porokeratosis 09/02/2018  . Corns and callosities 09/02/2018  . Elevated CK   . Pressure injury of skin 08/27/2018  . Atrial fibrillation ([place]) 08/27/2018  . Hypernatremia   . Rhabdomyolysis 08/26/2018  . Normocytic anemia 08/26/2018  . Renal insufficiency 08/26/2018  . Community acquired pneumonia 01/13/2013  . Sepsis ([place]) 01/13/2013  . Hyponatremia 01/13/2013  . Hypochloremia 01/13/2013  . Rheumatoid arthritis ([place]) 01/13/2013  . Premature atrial contractions 07/22/2012  . Hyperlipidemia 07/22/2012  . Carotid artery bruit 07/22/2012  . Aortic stenosis 07/22/2012  . Chest pain   . HTN (hypertension)     Past Surgical History:  Procedure Laterality Date  . APPENDECTOMY    . BUNIONECTOMY Bilateral   . cataract surgery Bilateral 05/2016  . gallstones removal    . TONSILLECTOMY       OB History   No obstetric history on file.     Family History  Problem Relation Age of Onset  . Diabetes Father   . Seizures Brother   . Alzheimer's disease Sister   . Cancer Sister   . Breast cancer Sister   . Cancer Sister   . Seizures Daughter   . Seizures Son   . Seizures Granddaughter   . Ovarian cancer Granddaughter   . Lung cancer Nephew   . Breast cancer Niece   . Breast cancer Niece   .  Brain cancer Niece     Social History   Tobacco Use  . Smoking status: Never Smoker  . Smokeless tobacco: Never Used  Vaping Use  . Vaping Use: Never used  Substance Use Topics  . Alcohol use: No  . Drug use: No    Home Medications Prior to Admission medications   Medication Sig Start Date End Date Taking? Authorizing Provider  aspirin EC 81 MG tablet Take 81 mg by mouth daily.    [provider]  calcium-vitamin D (OSCAL WITH D) 500-200 MG-UNIT per tablet Take 1 tablet by mouth daily.    [provider]   donepezil (ARICEPT) 10 MG tablet Take 10 mg by mouth at bedtime. 12/04/17   [provider]  hydrochlorothiazide (HYDRODIURIL) 25 MG tablet Take 25 mg by mouth daily.    [provider]  memantine (NAMENDA) 10 MG tablet Take 1 tablet (10 mg total) by mouth 2 (two) times daily. 10/28/19   Lomax, Amy, NP  mirabegron ER (MYRBETRIQ) 25 MG TB24 tablet Take 25 mg by mouth daily.    [provider]  oxybutynin (DITROPAN-XL) 10 MG 24 hr tablet Take 10 mg by mouth daily. 01/19/19   [provider]  rosuvastatin (CRESTOR) 20 MG tablet Take 20 mg by mouth daily.    [provider]  sertraline (ZOLOFT) 100 MG tablet Take 100 mg by mouth daily.  12/04/17   [provider]    Allergies    Codeine  Review of Systems   Review of Systems  Constitutional: Negative for chills and fever.  HENT: Negative for ear pain and sore throat.   Eyes: Negative for pain and visual disturbance.  Respiratory: Positive for cough. Negative for shortness of breath.   Cardiovascular: Negative for chest pain.  Gastrointestinal: Positive for nausea. Negative for abdominal pain, constipation, diarrhea and vomiting.  Genitourinary: Negative for dysuria and hematuria.  Musculoskeletal: Negative for back pain.  Skin: Negative for color change and rash.  Neurological: Negative for headaches.       No head trauma or loc  All other systems reviewed and are negative.   Physical Exam Updated Vital Signs BP 95/63   Pulse 81   Temp 98.5 F (36.9 C) (Oral)   Resp 14   SpO2 99%   Physical Exam Vitals and nursing note reviewed.  Constitutional:      General: She is not in acute distress.    Appearance: She is well-developed.  HENT:     Head: Normocephalic and atraumatic.  Eyes:     Conjunctiva/sclera: Conjunctivae normal.  Cardiovascular:     Rate and Rhythm: Normal rate and regular rhythm.     Heart sounds: Normal heart sounds. No murmur heard.   Pulmonary:      Effort: Pulmonary effort is normal. No respiratory distress.     Breath sounds: Normal breath sounds. No wheezing, rhonchi or rales.     Comments: Wet cough Abdominal:     General: Bowel sounds are normal.     Palpations: Abdomen is soft.     Tenderness: There is no abdominal tenderness. There is no guarding or rebound.  Musculoskeletal:     Cervical back: Neck supple.  Skin:    General: Skin is warm and dry.  Neurological:     Mental Status: She is alert.     ED Results / Procedures / Treatments   Labs (all labs ordered are listed, but only abnormal results are displayed) Labs Reviewed  RESPIRATORY PANEL  BY RT PCR (FLU A&B, COVID) - Abnormal; Notable for the following components:      Result Value   SARS Coronavirus 2 by RT PCR POSITIVE (*)    All other components within normal limits  CBC WITH DIFFERENTIAL/PLATELET - Abnormal; Notable for the following components:   Hemoglobin 11.6 (*)    Monocytes Absolute 1.1 (*)    Abs Immature Granulocytes 0.19 (*)    All other components within normal limits  COMPREHENSIVE METABOLIC PANEL - Abnormal; Notable for the following components:   Potassium 3.1 (*)    Glucose, Bld 127 (*)    BUN 46 (*)    Creatinine, Ser 1.26 (*)    Calcium 8.7 (*)    Total Protein 6.3 (*)    Albumin 2.9 (*)    AST 44 (*)    Alkaline Phosphatase 34 (*)    GFR, Estimated 41 (*)    All other components within normal limits    EKG None  Radiology CT Head Wo Contrast  Result Date: 12/16/2019 CLINICAL DATA:  Headache COVID positive EXAM: CT HEAD WITHOUT CONTRAST TECHNIQUE: Contiguous axial images were obtained from the base of the skull through the vertex without intravenous contrast. COMPARISON:  None. FINDINGS: Brain: No evidence of acute territorial infarction, hemorrhage, hydrocephalus,extra-axial collection or mass lesion/mass effect. There is dilatation the ventricles and sulci consistent with age-related atrophy. Low-attenuation changes in the deep  white matter consistent with small vessel ischemia. Vascular: No hyperdense vessel or unexpected calcification. Skull: The skull is intact. No fracture or focal lesion identified. Sinuses/Orbits: The visualized paranasal sinuses and mastoid air cells are clear. The orbits and globes intact. Other: None IMPRESSION: No acute intracranial abnormality. Findings consistent with age related atrophy and chronic small vessel ischemia Electronically Signed   By: Prudencio Pair M.D.   On: 12/16/2019 23:29   DG Chest Port 1 View  Result Date: 12/16/2019 CLINICAL DATA:  86 year old female with positive COVID test. EXAM: PORTABLE CHEST 1 VIEW COMPARISON:  Chest radiograph dated 08/26/2018. FINDINGS: There is cardiomegaly with mild vascular congestion. No focal consolidation, pleural effusion, or pneumothorax. Minimal bibasilar densities, likely atelectasis. Atherosclerotic calcification of the aorta. No acute osseous pathology. IMPRESSION: Cardiomegaly with mild vascular congestion. No focal consolidation. Electronically Signed   By: Anner Crete M.D.   On: 12/16/2019 21:33    Procedures Procedures (including critical care time)  Medications Ordered in ED Medications  ondansetron (ZOFRAN-ODT) disintegrating tablet 4 mg (4 mg Oral Given 12/16/19 2344)  acetaminophen (TYLENOL) tablet 650 mg (650 mg Oral Given 12/16/19 2344)  potassium chloride SA (KLOR-CON) CR tablet 40 mEq (40 mEq Oral Given 12/16/19 2343)    ED Course  I have reviewed the triage vital signs and the nursing notes.  Pertinent labs & imaging results that were available during my care of the patient were reviewed by me and considered in my medical decision making (see chart for details).    MDM Rules/Calculators/A&P                          86 y/o F presenting for eval of hypoxia noted at home. Recently diagnosed with COVID.   On my evaluation patient is satting at 100% on room air.  Remainder of her vital signs are  reassuring.  Covid test is positive   reviewed/interpreted chest x-ray - Cardiomegaly with mild vascular congestion. No focal consolidation.  Reviewed/interpreted labs CBC w/o leukocytosis. Mild anemia present.  CMP with mild hypokalemia  which was supplemented in the ED.  She also had a mild elevation in her creatinine.  She was offered IV fluid hydration however she declined in lieu of of oral hydration with p.o. fluids.  CT head obtained due to recent fall and unclear head trauma -did not show any findings of acute trauma  Patient sats are at 95 to 100% on our assessment.  I ambulated her in the room for a short period of time and she did not get significantly symptomatic.  Unable to ambulate with pulse ox due to difficulty with machine in ED however patient is not complaining of any shortness of breath therefore low suspicion for hypoxia with ambulation.  She is well-appearing on my exam, she appears mildly dehydrated but as noted above she declined IV fluids.  She was able to tolerate about 500 cc fluid in the ED.  I discussed her work-up and plan with her daughter who is comfortable with the plan for discharge.  Will refer her to the [facility].  She has already received Rx for cough medication for home.  Advised on close follow-up with PCP and strict return precautions.  Upon discharge, patient's final blood pressure was noted to be 95 systolic.  I was not made aware of this prior to the patient's discharge.  On my evaluation she did not have any episodes of hypotension and I feel that this may be an inaccurate read.  In any case she did not have any symptoms of hypotension on my evaluation.  I discussed the patient's work-up and plan with Dr. Tomi Bamberger, supervising physician, who is in agreement with the plan.  Final Clinical Impression(s) / ED Diagnoses Final diagnoses:  COVID    Rx / DC Orders ED Discharge Orders    None       Bishop Dublin 12/17/19 1406    Dorie Rank,  MD 12/20/19 [phone]

## 2019-12-17 ENCOUNTER — Telehealth: Payer: Self-pay | Admitting: Nurse Practitioner

## 2019-12-17 DIAGNOSIS — U071 COVID-19: Secondary | ICD-10-CM

## 2019-12-17 NOTE — Discharge Instructions (Addendum)
The infusion clinic will reach out to your or your daughter to schedule an appointment for a monoclonal antibody infusion.   Make sure you are stay well hydrated at home.  Please follow up with your primary care provider within 5-7 days for re-evaluation of your symptoms. If you do not have a primary care provider, information for a healthcare clinic has been provided for you to make arrangements for follow up care. Please return to the emergency department for any new or worsening symptoms.

## 2019-12-17 NOTE — Telephone Encounter (Signed)
Called to Discuss with patient about Covid symptoms and the use of a monoclonal antibody infusion for those with mild to moderate Covid symptoms and at a high risk of hospitalization.     Pt is qualified for this infusion at the [facility] due to co-morbid conditions and/or a member of an at-risk group.     Unable to reach pt. Left message to return call on daughter's phone.   Beckey Rutter, [place], AGNP-C [phone] ([place])

## 2019-12-18 ENCOUNTER — Ambulatory Visit (HOSPITAL_COMMUNITY)
Admission: RE | Admit: 2019-12-18 | Discharge: 2019-12-18 | Disposition: A | Discharge status: Home / Self Care | Payer: Medicare Other | Source: Ambulatory Visit | Attending: Pulmonary Disease | Admitting: Pulmonary Disease

## 2019-12-18 ENCOUNTER — Other Ambulatory Visit: Payer: Self-pay | Admitting: Physician Assistant

## 2019-12-18 DIAGNOSIS — I350 Nonrheumatic aortic (valve) stenosis: Secondary | ICD-10-CM | POA: Insufficient documentation

## 2019-12-18 DIAGNOSIS — U071 COVID-19: Secondary | ICD-10-CM | POA: Diagnosis present

## 2019-12-18 DIAGNOSIS — I480 Paroxysmal atrial fibrillation: Secondary | ICD-10-CM | POA: Insufficient documentation

## 2019-12-18 DIAGNOSIS — Z23 Encounter for immunization: Secondary | ICD-10-CM | POA: Insufficient documentation

## 2019-12-18 DIAGNOSIS — M069 Rheumatoid arthritis, unspecified: Secondary | ICD-10-CM

## 2019-12-18 DIAGNOSIS — I10 Essential (primary) hypertension: Secondary | ICD-10-CM | POA: Diagnosis present

## 2019-12-18 MED ORDER — METHYLPREDNISOLONE SODIUM SUCC 125 MG IJ SOLR: 125.0000 mg | Freq: Once | INTRAMUSCULAR | Status: DC | PRN

## 2019-12-18 MED ORDER — SODIUM CHLORIDE 0.9 % IV SOLN: INTRAVENOUS | Status: DC | PRN

## 2019-12-18 MED ORDER — SODIUM CHLORIDE 0.9 % IV SOLN: Freq: Once | INTRAVENOUS | Status: AC

## 2019-12-18 MED ORDER — DIPHENHYDRAMINE HCL 50 MG/ML IJ SOLN: 50.0000 mg | Freq: Once | INTRAMUSCULAR | Status: DC | PRN

## 2019-12-18 MED ORDER — EPINEPHRINE 0.3 MG/0.3ML IJ SOAJ: 0.3000 mg | Freq: Once | INTRAMUSCULAR | Status: DC | PRN

## 2019-12-18 MED ORDER — ALBUTEROL SULFATE HFA 108 (90 BASE) MCG/ACT IN AERS: 2.0000 | INHALATION_SPRAY | Freq: Once | RESPIRATORY_TRACT | Status: DC | PRN

## 2019-12-18 MED ORDER — FAMOTIDINE IN NACL 20-0.9 MG/50ML-% IV SOLN: 20.0000 mg | Freq: Once | INTRAVENOUS | Status: DC | PRN

## 2019-12-18 NOTE — Progress Notes (Signed)
I connected by phone with [name] [name] on 12/18/2019 at 8:30 AM to discuss the potential use of a new treatment for mild to moderate COVID-19 viral infection in non-hospitalized patients.  This patient is a 86 y.o. female that meets the FDA criteria for Emergency Use Authorization of COVID monoclonal antibody casirivimab/imdevimab or bamlamivimab/estevimab.  Has a (+) direct SARS-CoV-2 viral test result  Has mild or moderate COVID-19   Is NOT hospitalized due to COVID-19  Is within 10 days of symptom onset  Has at least one of the high risk factor(s) for progression to severe COVID-19 and/or hospitalization as defined in EUA.  Specific high risk criteria : Older age (>/= 84 yo) and Cardiovascular disease or hypertension   I have spoken and communicated the following to the patient or parent/caregiver regarding COVID monoclonal antibody treatment:  1. FDA has authorized the emergency use for the treatment of mild to moderate COVID-19 in adults and pediatric patients with positive results of direct SARS-CoV-2 viral testing who are 29 years of age and older weighing at least 40 kg, and who are at high risk for progressing to severe COVID-19 and/or hospitalization.  2. The significant known and potential risks and benefits of COVID monoclonal antibody, and the extent to which such potential risks and benefits are unknown.  3. Information on available alternative treatments and the risks and benefits of those alternatives, including clinical trials.  4. Patients treated with COVID monoclonal antibody should continue to self-isolate and use infection control measures (e.g., wear mask, isolate, social distance, avoid sharing personal items, clean and disinfect "high touch" surfaces, and frequent handwashing) according to CDC guidelines.   5. The patient or parent/caregiver has the option to accept or refuse COVID monoclonal antibody treatment.  After reviewing this information with the patient,  the patient has agreed to receive one of the available covid 19 monoclonal antibodies and will be provided an appropriate fact sheet prior to infusion.  Sx onset 10/22. Set up for infusion on 10/29 @ 1:30pm. Directions given to [facility]. Pt is aware that insurance will be charged an infusion fee. Pt is fully vaccinated.   Angelena Form 12/18/2019 8:30 AM

## 2019-12-18 NOTE — Progress Notes (Signed)
Patient ID: [name] [name], female   DOB: Jun 10, 1928, 86 y.o.   MRN: [id number]   Diagnosis: POEUM-[id number]  Physician: Ashby Dawes  Procedure: Covid Infusion Clinic Med: bamlanivimab\etesevimab infusion - Provided patient with bamlanimivab\etesevimab fact sheet for patients, parents and caregivers prior to infusion.  Complications: No immediate complications noted.  Discharge: Discharged home   [name] 12/18/2019

## 2019-12-18 NOTE — Discharge Instructions (Signed)

## 2019-12-21 ENCOUNTER — Emergency Department (HOSPITAL_COMMUNITY)
Admission: EM | Admit: 2019-12-21 | Discharge: 2019-12-21 | Disposition: A | Discharge status: Home / Self Care | Payer: PPO | Source: Home / Self Care | Attending: Emergency Medicine | Admitting: Emergency Medicine

## 2019-12-21 ENCOUNTER — Other Ambulatory Visit: Payer: Self-pay

## 2019-12-21 ENCOUNTER — Emergency Department (HOSPITAL_COMMUNITY): Payer: PPO

## 2019-12-21 ENCOUNTER — Encounter (HOSPITAL_COMMUNITY): Payer: Self-pay

## 2019-12-21 DIAGNOSIS — J1282 Pneumonia due to coronavirus disease 2019: Secondary | ICD-10-CM | POA: Diagnosis present

## 2019-12-21 DIAGNOSIS — F0390 Unspecified dementia without behavioral disturbance: Secondary | ICD-10-CM | POA: Diagnosis present

## 2019-12-21 DIAGNOSIS — R2689 Other abnormalities of gait and mobility: Secondary | ICD-10-CM | POA: Diagnosis not present

## 2019-12-21 DIAGNOSIS — I480 Paroxysmal atrial fibrillation: Secondary | ICD-10-CM | POA: Diagnosis present

## 2019-12-21 DIAGNOSIS — M6282 Rhabdomyolysis: Secondary | ICD-10-CM | POA: Diagnosis not present

## 2019-12-21 DIAGNOSIS — Z5321 Procedure and treatment not carried out due to patient leaving prior to being seen by health care provider: Secondary | ICD-10-CM | POA: Insufficient documentation

## 2019-12-21 DIAGNOSIS — R0602 Shortness of breath: Secondary | ICD-10-CM | POA: Diagnosis not present

## 2019-12-21 DIAGNOSIS — I10 Essential (primary) hypertension: Secondary | ICD-10-CM | POA: Diagnosis present

## 2019-12-21 DIAGNOSIS — Z808 Family history of malignant neoplasm of other organs or systems: Secondary | ICD-10-CM | POA: Diagnosis not present

## 2019-12-21 DIAGNOSIS — Z820 Family history of epilepsy and other diseases of the nervous system: Secondary | ICD-10-CM | POA: Diagnosis not present

## 2019-12-21 DIAGNOSIS — Z79899 Other long term (current) drug therapy: Secondary | ICD-10-CM | POA: Diagnosis not present

## 2019-12-21 DIAGNOSIS — E876 Hypokalemia: Secondary | ICD-10-CM | POA: Diagnosis present

## 2019-12-21 DIAGNOSIS — Z9181 History of falling: Secondary | ICD-10-CM | POA: Diagnosis not present

## 2019-12-21 DIAGNOSIS — E222 Syndrome of inappropriate secretion of antidiuretic hormone: Secondary | ICD-10-CM | POA: Diagnosis present

## 2019-12-21 DIAGNOSIS — Z885 Allergy status to narcotic agent status: Secondary | ICD-10-CM | POA: Diagnosis not present

## 2019-12-21 DIAGNOSIS — M069 Rheumatoid arthritis, unspecified: Secondary | ICD-10-CM | POA: Diagnosis present

## 2019-12-21 DIAGNOSIS — R7989 Other specified abnormal findings of blood chemistry: Secondary | ICD-10-CM | POA: Diagnosis present

## 2019-12-21 DIAGNOSIS — Z833 Family history of diabetes mellitus: Secondary | ICD-10-CM | POA: Diagnosis not present

## 2019-12-21 DIAGNOSIS — E871 Hypo-osmolality and hyponatremia: Secondary | ICD-10-CM | POA: Diagnosis not present

## 2019-12-21 DIAGNOSIS — J9601 Acute respiratory failure with hypoxia: Secondary | ICD-10-CM | POA: Diagnosis not present

## 2019-12-21 DIAGNOSIS — I350 Nonrheumatic aortic (valve) stenosis: Secondary | ICD-10-CM | POA: Diagnosis present

## 2019-12-21 DIAGNOSIS — Z8041 Family history of malignant neoplasm of ovary: Secondary | ICD-10-CM | POA: Diagnosis not present

## 2019-12-21 DIAGNOSIS — J1289 Other viral pneumonia: Secondary | ICD-10-CM | POA: Diagnosis not present

## 2019-12-21 DIAGNOSIS — Z801 Family history of malignant neoplasm of trachea, bronchus and lung: Secondary | ICD-10-CM | POA: Diagnosis not present

## 2019-12-21 DIAGNOSIS — F32A Depression, unspecified: Secondary | ICD-10-CM | POA: Diagnosis present

## 2019-12-21 DIAGNOSIS — E785 Hyperlipidemia, unspecified: Secondary | ICD-10-CM | POA: Diagnosis present

## 2019-12-21 DIAGNOSIS — J189 Pneumonia, unspecified organism: Secondary | ICD-10-CM | POA: Diagnosis not present

## 2019-12-21 DIAGNOSIS — J9811 Atelectasis: Secondary | ICD-10-CM | POA: Diagnosis not present

## 2019-12-21 DIAGNOSIS — J9621 Acute and chronic respiratory failure with hypoxia: Secondary | ICD-10-CM | POA: Diagnosis not present

## 2019-12-21 DIAGNOSIS — J90 Pleural effusion, not elsewhere classified: Secondary | ICD-10-CM | POA: Diagnosis not present

## 2019-12-21 DIAGNOSIS — R498 Other voice and resonance disorders: Secondary | ICD-10-CM | POA: Diagnosis not present

## 2019-12-21 DIAGNOSIS — R1312 Dysphagia, oropharyngeal phase: Secondary | ICD-10-CM | POA: Diagnosis not present

## 2019-12-21 DIAGNOSIS — I2699 Other pulmonary embolism without acute cor pulmonale: Secondary | ICD-10-CM | POA: Diagnosis not present

## 2019-12-21 DIAGNOSIS — Z803 Family history of malignant neoplasm of breast: Secondary | ICD-10-CM | POA: Diagnosis not present

## 2019-12-21 DIAGNOSIS — U071 COVID-19: Secondary | ICD-10-CM | POA: Diagnosis present

## 2019-12-21 DIAGNOSIS — M6281 Muscle weakness (generalized): Secondary | ICD-10-CM | POA: Diagnosis not present

## 2019-12-21 DIAGNOSIS — I517 Cardiomegaly: Secondary | ICD-10-CM | POA: Diagnosis not present

## 2019-12-21 DIAGNOSIS — R41841 Cognitive communication deficit: Secondary | ICD-10-CM | POA: Diagnosis not present

## 2019-12-21 DIAGNOSIS — R7401 Elevation of levels of liver transaminase levels: Secondary | ICD-10-CM | POA: Diagnosis present

## 2019-12-21 DIAGNOSIS — Z7982 Long term (current) use of aspirin: Secondary | ICD-10-CM | POA: Diagnosis not present

## 2019-12-21 DIAGNOSIS — R2681 Unsteadiness on feet: Secondary | ICD-10-CM | POA: Diagnosis not present

## 2019-12-21 NOTE — ED Notes (Signed)
Pt did not respond to final call for vitals check

## 2019-12-21 NOTE — ED Notes (Signed)
Pt did not respond when called for vitals check X2

## 2019-12-21 NOTE — ED Triage Notes (Signed)
Pt sent here by PCP, per daughter pt's O2 sats were 71% on RA, she has noted pt to be weaker today with heavy breathing while sitting. Pt NAD in triage, pt states, "I don't know why I'm here, this is my 2nd time and nothing has changed. I feel fine." pt sats 97% RA in triage, no respiratory distress noted. Pt tested + for covid 12/16/2019

## 2019-12-22 ENCOUNTER — Other Ambulatory Visit: Payer: Self-pay

## 2019-12-22 ENCOUNTER — Inpatient Hospital Stay (HOSPITAL_COMMUNITY)
Admission: EM | Admit: 2019-12-22 | Discharge: 2019-12-29 | DRG: 177 | Disposition: A | Discharge status: Skilled Nursing Facility | Payer: PPO | Attending: Internal Medicine | Admitting: Internal Medicine

## 2019-12-22 DIAGNOSIS — Z8041 Family history of malignant neoplasm of ovary: Secondary | ICD-10-CM

## 2019-12-22 DIAGNOSIS — Z9181 History of falling: Secondary | ICD-10-CM

## 2019-12-22 DIAGNOSIS — R7989 Other specified abnormal findings of blood chemistry: Secondary | ICD-10-CM | POA: Diagnosis present

## 2019-12-22 DIAGNOSIS — Z833 Family history of diabetes mellitus: Secondary | ICD-10-CM

## 2019-12-22 DIAGNOSIS — F0390 Unspecified dementia without behavioral disturbance: Secondary | ICD-10-CM | POA: Diagnosis present

## 2019-12-22 DIAGNOSIS — F32A Depression, unspecified: Secondary | ICD-10-CM | POA: Diagnosis present

## 2019-12-22 DIAGNOSIS — M069 Rheumatoid arthritis, unspecified: Secondary | ICD-10-CM | POA: Diagnosis present

## 2019-12-22 DIAGNOSIS — J9621 Acute and chronic respiratory failure with hypoxia: Secondary | ICD-10-CM

## 2019-12-22 DIAGNOSIS — R7401 Elevation of levels of liver transaminase levels: Secondary | ICD-10-CM

## 2019-12-22 DIAGNOSIS — Z808 Family history of malignant neoplasm of other organs or systems: Secondary | ICD-10-CM

## 2019-12-22 DIAGNOSIS — Z803 Family history of malignant neoplasm of breast: Secondary | ICD-10-CM

## 2019-12-22 DIAGNOSIS — Z885 Allergy status to narcotic agent status: Secondary | ICD-10-CM

## 2019-12-22 DIAGNOSIS — I350 Nonrheumatic aortic (valve) stenosis: Secondary | ICD-10-CM | POA: Diagnosis present

## 2019-12-22 DIAGNOSIS — U071 COVID-19: Principal | ICD-10-CM | POA: Diagnosis present

## 2019-12-22 DIAGNOSIS — J1282 Pneumonia due to coronavirus disease 2019: Secondary | ICD-10-CM | POA: Diagnosis present

## 2019-12-22 DIAGNOSIS — I4891 Unspecified atrial fibrillation: Secondary | ICD-10-CM | POA: Diagnosis present

## 2019-12-22 DIAGNOSIS — Z801 Family history of malignant neoplasm of trachea, bronchus and lung: Secondary | ICD-10-CM

## 2019-12-22 DIAGNOSIS — E222 Syndrome of inappropriate secretion of antidiuretic hormone: Secondary | ICD-10-CM | POA: Diagnosis present

## 2019-12-22 DIAGNOSIS — Z7982 Long term (current) use of aspirin: Secondary | ICD-10-CM

## 2019-12-22 DIAGNOSIS — I10 Essential (primary) hypertension: Secondary | ICD-10-CM | POA: Diagnosis present

## 2019-12-22 DIAGNOSIS — Z820 Family history of epilepsy and other diseases of the nervous system: Secondary | ICD-10-CM

## 2019-12-22 DIAGNOSIS — E876 Hypokalemia: Secondary | ICD-10-CM

## 2019-12-22 DIAGNOSIS — E785 Hyperlipidemia, unspecified: Secondary | ICD-10-CM | POA: Diagnosis present

## 2019-12-22 DIAGNOSIS — J9601 Acute respiratory failure with hypoxia: Secondary | ICD-10-CM

## 2019-12-22 DIAGNOSIS — Z79899 Other long term (current) drug therapy: Secondary | ICD-10-CM

## 2019-12-22 DIAGNOSIS — I480 Paroxysmal atrial fibrillation: Secondary | ICD-10-CM | POA: Diagnosis present

## 2019-12-22 NOTE — ED Triage Notes (Signed)
Reported she had a chest XRAY yesterday and was told she had a "double pneumonia". Patient stated "if only I can get all this mucous out". NAD.

## 2019-12-23 ENCOUNTER — Encounter (HOSPITAL_COMMUNITY): Payer: Self-pay | Admitting: Internal Medicine

## 2019-12-23 ENCOUNTER — Inpatient Hospital Stay (HOSPITAL_COMMUNITY): Payer: PPO

## 2019-12-23 DIAGNOSIS — Z9181 History of falling: Secondary | ICD-10-CM | POA: Diagnosis not present

## 2019-12-23 DIAGNOSIS — E785 Hyperlipidemia, unspecified: Secondary | ICD-10-CM | POA: Diagnosis present

## 2019-12-23 DIAGNOSIS — Z8041 Family history of malignant neoplasm of ovary: Secondary | ICD-10-CM | POA: Diagnosis not present

## 2019-12-23 DIAGNOSIS — Z820 Family history of epilepsy and other diseases of the nervous system: Secondary | ICD-10-CM | POA: Diagnosis not present

## 2019-12-23 DIAGNOSIS — I517 Cardiomegaly: Secondary | ICD-10-CM | POA: Diagnosis not present

## 2019-12-23 DIAGNOSIS — M069 Rheumatoid arthritis, unspecified: Secondary | ICD-10-CM | POA: Diagnosis present

## 2019-12-23 DIAGNOSIS — U071 COVID-19: Principal | ICD-10-CM | POA: Diagnosis present

## 2019-12-23 DIAGNOSIS — E876 Hypokalemia: Secondary | ICD-10-CM

## 2019-12-23 DIAGNOSIS — Z801 Family history of malignant neoplasm of trachea, bronchus and lung: Secondary | ICD-10-CM | POA: Diagnosis not present

## 2019-12-23 DIAGNOSIS — E871 Hypo-osmolality and hyponatremia: Secondary | ICD-10-CM | POA: Diagnosis not present

## 2019-12-23 DIAGNOSIS — I480 Paroxysmal atrial fibrillation: Secondary | ICD-10-CM | POA: Diagnosis present

## 2019-12-23 DIAGNOSIS — F0390 Unspecified dementia without behavioral disturbance: Secondary | ICD-10-CM | POA: Diagnosis present

## 2019-12-23 DIAGNOSIS — I10 Essential (primary) hypertension: Secondary | ICD-10-CM | POA: Diagnosis not present

## 2019-12-23 DIAGNOSIS — J1282 Pneumonia due to coronavirus disease 2019: Secondary | ICD-10-CM

## 2019-12-23 DIAGNOSIS — R7401 Elevation of levels of liver transaminase levels: Secondary | ICD-10-CM | POA: Diagnosis present

## 2019-12-23 DIAGNOSIS — Z79899 Other long term (current) drug therapy: Secondary | ICD-10-CM | POA: Diagnosis not present

## 2019-12-23 DIAGNOSIS — Z885 Allergy status to narcotic agent status: Secondary | ICD-10-CM | POA: Diagnosis not present

## 2019-12-23 DIAGNOSIS — R7989 Other specified abnormal findings of blood chemistry: Secondary | ICD-10-CM | POA: Diagnosis present

## 2019-12-23 DIAGNOSIS — Z808 Family history of malignant neoplasm of other organs or systems: Secondary | ICD-10-CM | POA: Diagnosis not present

## 2019-12-23 DIAGNOSIS — Z7982 Long term (current) use of aspirin: Secondary | ICD-10-CM | POA: Diagnosis not present

## 2019-12-23 DIAGNOSIS — I2699 Other pulmonary embolism without acute cor pulmonale: Secondary | ICD-10-CM | POA: Diagnosis not present

## 2019-12-23 DIAGNOSIS — F32A Depression, unspecified: Secondary | ICD-10-CM | POA: Diagnosis not present

## 2019-12-23 DIAGNOSIS — J9601 Acute respiratory failure with hypoxia: Secondary | ICD-10-CM

## 2019-12-23 DIAGNOSIS — I350 Nonrheumatic aortic (valve) stenosis: Secondary | ICD-10-CM | POA: Diagnosis not present

## 2019-12-23 DIAGNOSIS — J9811 Atelectasis: Secondary | ICD-10-CM | POA: Diagnosis not present

## 2019-12-23 DIAGNOSIS — Z833 Family history of diabetes mellitus: Secondary | ICD-10-CM | POA: Diagnosis not present

## 2019-12-23 DIAGNOSIS — J189 Pneumonia, unspecified organism: Secondary | ICD-10-CM | POA: Diagnosis not present

## 2019-12-23 DIAGNOSIS — Z803 Family history of malignant neoplasm of breast: Secondary | ICD-10-CM | POA: Diagnosis not present

## 2019-12-23 DIAGNOSIS — E222 Syndrome of inappropriate secretion of antidiuretic hormone: Secondary | ICD-10-CM | POA: Diagnosis not present

## 2019-12-23 LAB — COMPREHENSIVE METABOLIC PANEL
ALT: 48 U/L — ABNORMAL HIGH (ref 0–44)
AST: 62 U/L — ABNORMAL HIGH (ref 15–41)
Albumin: 2.4 g/dL — ABNORMAL LOW (ref 3.5–5.0)
Alkaline Phosphatase: 57 U/L (ref 38–126)
Anion gap: 13 (ref 5–15)
BUN: 28 mg/dL — ABNORMAL HIGH (ref 8–23)
CO2: 28 mmol/L (ref 22–32)
Calcium: 9.1 mg/dL (ref 8.9–10.3)
Chloride: 96 mmol/L — ABNORMAL LOW (ref 98–111)
Creatinine, Ser: 1.14 mg/dL — ABNORMAL HIGH (ref 0.44–1.00)
GFR, Estimated: 46 mL/min — ABNORMAL LOW (ref >60)
Glucose, Bld: 175 mg/dL — ABNORMAL HIGH (ref 70–99)
Potassium: 2.7 mmol/L — CL (ref 3.5–5.1)
Sodium: 137 mmol/L (ref 135–145)
Total Bilirubin: 0.8 mg/dL (ref 0.3–1.2)
Total Protein: 7.1 g/dL (ref 6.5–8.1)

## 2019-12-23 LAB — CBC WITH DIFFERENTIAL/PLATELET
Abs Immature Granulocytes: 0.23 10*3/uL — ABNORMAL HIGH (ref 0.00–0.07)
Basophils Absolute: 0 10*3/uL (ref 0.0–0.1)
Basophils Relative: 0 %
Eosinophils Absolute: 0.1 10*3/uL (ref 0.0–0.5)
Eosinophils Relative: 1 %
HCT: 35.9 % — ABNORMAL LOW (ref 36.0–46.0)
Hemoglobin: 11.5 g/dL — ABNORMAL LOW (ref 12.0–15.0)
Immature Granulocytes: 2 %
Lymphocytes Relative: 16 %
Lymphs Abs: 2.1 10*3/uL (ref 0.7–4.0)
MCH: 30 pg (ref 26.0–34.0)
MCHC: 32 g/dL (ref 30.0–36.0)
MCV: 93.7 fL (ref 80.0–100.0)
Monocytes Absolute: 1 10*3/uL (ref 0.1–1.0)
Monocytes Relative: 7 %
Neutro Abs: 9.6 10*3/uL — ABNORMAL HIGH (ref 1.7–7.7)
Neutrophils Relative %: 74 %
Platelets: 474 10*3/uL — ABNORMAL HIGH (ref 150–400)
RBC: 3.83 MIL/uL — ABNORMAL LOW (ref 3.87–5.11)
RDW: 14 % (ref 11.5–15.5)
WBC: 13 10*3/uL — ABNORMAL HIGH (ref 4.0–10.5)
nRBC: 0 % (ref 0.0–0.2)

## 2019-12-23 LAB — BASIC METABOLIC PANEL
Anion gap: 11 (ref 5–15)
BUN: 23 mg/dL (ref 8–23)
CO2: 27 mmol/L (ref 22–32)
Calcium: 8.8 mg/dL — ABNORMAL LOW (ref 8.9–10.3)
Chloride: 98 mmol/L (ref 98–111)
Creatinine, Ser: 1 mg/dL (ref 0.44–1.00)
GFR, Estimated: 54 mL/min — ABNORMAL LOW (ref >60)
Glucose, Bld: 130 mg/dL — ABNORMAL HIGH (ref 70–99)
Potassium: 3.9 mmol/L (ref 3.5–5.1)
Sodium: 136 mmol/L (ref 135–145)

## 2019-12-23 LAB — MAGNESIUM
Magnesium: 1.8 mg/dL (ref 1.7–2.4)
Magnesium: 1.8 mg/dL (ref 1.7–2.4)

## 2019-12-23 LAB — FERRITIN: Ferritin: 541 ng/mL — ABNORMAL HIGH (ref 11–307)

## 2019-12-23 LAB — LACTIC ACID, PLASMA
Lactic Acid, Venous: 1.7 mmol/L (ref 0.5–1.9)
Lactic Acid, Venous: 1.7 mmol/L (ref 0.5–1.9)

## 2019-12-23 LAB — D-DIMER, QUANTITATIVE: D-Dimer, Quant: 6.18 ug/mL-FEU — ABNORMAL HIGH (ref 0.00–0.50)

## 2019-12-23 LAB — C-REACTIVE PROTEIN: CRP: 19.3 mg/dL — ABNORMAL HIGH (ref <1.0)

## 2019-12-23 LAB — FIBRINOGEN: Fibrinogen: 798 mg/dL — ABNORMAL HIGH (ref 210–475)

## 2019-12-23 LAB — LACTATE DEHYDROGENASE: LDH: 279 U/L — ABNORMAL HIGH (ref 98–192)

## 2019-12-23 LAB — PROCALCITONIN: Procalcitonin: <0.1 ng/mL

## 2019-12-23 LAB — TRIGLYCERIDES: Triglycerides: 71 mg/dL (ref <150)

## 2019-12-23 MED ORDER — SODIUM CHLORIDE 0.9 % IV SOLN
100.0000 mg | Freq: Every day | INTRAVENOUS | Status: AC
  Administered 2019-12-24 – 2019-12-27 (×4): 100 mg via INTRAVENOUS
  Filled 2019-12-23 (×4): qty 20

## 2019-12-23 MED ORDER — ENOXAPARIN SODIUM 100 MG/ML ~~LOC~~ SOLN
1.0000 mg/kg | Freq: Two times a day (BID) | SUBCUTANEOUS | Status: DC
  Administered 2019-12-23: 82.5 mg via SUBCUTANEOUS
  Filled 2019-12-23: qty 0.82

## 2019-12-23 MED ORDER — IOHEXOL 350 MG/ML SOLN
100.0000 mL | Freq: Once | INTRAVENOUS | Status: AC | PRN
  Administered 2019-12-23: 80 mL via INTRAVENOUS

## 2019-12-23 MED ORDER — VITAMIN D 25 MCG (1000 UNIT) PO TABS
1000.0000 [IU] | ORAL_TABLET | Freq: Every day | ORAL | Status: DC
  Administered 2019-12-23 – 2019-12-29 (×7): 1000 [IU] via ORAL
  Filled 2019-12-23 (×7): qty 1

## 2019-12-23 MED ORDER — DEXAMETHASONE SODIUM PHOSPHATE 10 MG/ML IJ SOLN
6.0000 mg | INTRAMUSCULAR | Status: DC
  Administered 2019-12-24 – 2019-12-29 (×6): 6 mg via INTRAVENOUS
  Filled 2019-12-23 (×6): qty 1

## 2019-12-23 MED ORDER — ENOXAPARIN SODIUM 80 MG/0.8ML ~~LOC~~ SOLN
80.0000 mg | Freq: Two times a day (BID) | SUBCUTANEOUS | Status: DC
  Administered 2019-12-23 – 2019-12-25 (×4): 80 mg via SUBCUTANEOUS
  Filled 2019-12-23 (×4): qty 0.8

## 2019-12-23 MED ORDER — DILTIAZEM HCL-DEXTROSE 125-5 MG/125ML-% IV SOLN (PREMIX)
5.0000 mg/h | INTRAVENOUS | Status: DC
  Administered 2019-12-23: 5 mg/h via INTRAVENOUS
  Administered 2019-12-23: 12.5 mg/h via INTRAVENOUS
  Filled 2019-12-23 (×4): qty 125

## 2019-12-23 MED ORDER — DONEPEZIL HCL 10 MG PO TABS
10.0000 mg | ORAL_TABLET | Freq: Every day | ORAL | Status: DC
  Administered 2019-12-23 – 2019-12-28 (×6): 10 mg via ORAL
  Filled 2019-12-23 (×6): qty 1

## 2019-12-23 MED ORDER — GUAIFENESIN-DM 100-10 MG/5ML PO SYRP
10.0000 mL | ORAL_SOLUTION | ORAL | Status: DC | PRN
  Administered 2019-12-23: 10 mL via ORAL
  Filled 2019-12-23: qty 10

## 2019-12-23 MED ORDER — ASCORBIC ACID 500 MG PO TABS
500.0000 mg | ORAL_TABLET | Freq: Every day | ORAL | Status: DC
  Administered 2019-12-23 – 2019-12-29 (×7): 500 mg via ORAL
  Filled 2019-12-23 (×7): qty 1

## 2019-12-23 MED ORDER — POTASSIUM CHLORIDE 10 MEQ/100ML IV SOLN
10.0000 meq | Freq: Once | INTRAVENOUS | Status: AC
  Administered 2019-12-23: 10 meq via INTRAVENOUS
  Filled 2019-12-23: qty 100

## 2019-12-23 MED ORDER — ASPIRIN EC 81 MG PO TBEC
81.0000 mg | DELAYED_RELEASE_TABLET | Freq: Every day | ORAL | Status: DC
  Administered 2019-12-23 – 2019-12-29 (×7): 81 mg via ORAL
  Filled 2019-12-23 (×7): qty 1

## 2019-12-23 MED ORDER — POTASSIUM CHLORIDE CRYS ER 20 MEQ PO TBCR
40.0000 meq | EXTENDED_RELEASE_TABLET | Freq: Once | ORAL | Status: AC
  Administered 2019-12-23: 40 meq via ORAL
  Filled 2019-12-23: qty 2

## 2019-12-23 MED ORDER — SODIUM CHLORIDE 0.9 % IV SOLN
200.0000 mg | Freq: Once | INTRAVENOUS | Status: AC
  Administered 2019-12-23: 200 mg via INTRAVENOUS
  Filled 2019-12-23: qty 40

## 2019-12-23 MED ORDER — ROSUVASTATIN CALCIUM 20 MG PO TABS
20.0000 mg | ORAL_TABLET | Freq: Every day | ORAL | Status: DC
  Administered 2019-12-23 – 2019-12-29 (×7): 20 mg via ORAL
  Filled 2019-12-23 (×7): qty 1

## 2019-12-23 MED ORDER — DEXAMETHASONE SODIUM PHOSPHATE 10 MG/ML IJ SOLN
6.0000 mg | Freq: Once | INTRAMUSCULAR | Status: AC
  Administered 2019-12-23: 6 mg via INTRAVENOUS
  Filled 2019-12-23: qty 1

## 2019-12-23 MED ORDER — OXYBUTYNIN CHLORIDE ER 10 MG PO TB24
10.0000 mg | ORAL_TABLET | Freq: Every day | ORAL | Status: DC
  Administered 2019-12-23 – 2019-12-29 (×7): 10 mg via ORAL
  Filled 2019-12-23 (×7): qty 1

## 2019-12-23 MED ORDER — ZINC SULFATE 220 (50 ZN) MG PO CAPS
220.0000 mg | ORAL_CAPSULE | Freq: Every day | ORAL | Status: DC
  Administered 2019-12-23 – 2019-12-29 (×7): 220 mg via ORAL
  Filled 2019-12-23 (×7): qty 1

## 2019-12-23 MED ORDER — POTASSIUM CHLORIDE 10 MEQ/100ML IV SOLN
10.0000 meq | INTRAVENOUS | Status: AC
  Administered 2019-12-23 (×5): 10 meq via INTRAVENOUS
  Filled 2019-12-23 (×5): qty 100

## 2019-12-23 MED ORDER — METOPROLOL TARTRATE 5 MG/5ML IV SOLN: 5.0000 mg | INTRAVENOUS | Status: DC | PRN

## 2019-12-23 MED ORDER — MIRABEGRON ER 25 MG PO TB24
25.0000 mg | ORAL_TABLET | Freq: Every day | ORAL | Status: DC
  Administered 2019-12-23 – 2019-12-29 (×7): 25 mg via ORAL
  Filled 2019-12-23 (×7): qty 1

## 2019-12-23 MED ORDER — HYDROCHLOROTHIAZIDE 25 MG PO TABS
25.0000 mg | ORAL_TABLET | Freq: Every day | ORAL | Status: DC
  Administered 2019-12-23 – 2019-12-26 (×4): 25 mg via ORAL
  Filled 2019-12-23 (×4): qty 1

## 2019-12-23 MED ORDER — ALBUTEROL SULFATE HFA 108 (90 BASE) MCG/ACT IN AERS: 2.0000 | INHALATION_SPRAY | Freq: Four times a day (QID) | RESPIRATORY_TRACT | Status: DC | PRN

## 2019-12-23 MED ORDER — SERTRALINE HCL 100 MG PO TABS
100.0000 mg | ORAL_TABLET | Freq: Every day | ORAL | Status: DC
  Administered 2019-12-23 – 2019-12-29 (×7): 100 mg via ORAL
  Filled 2019-12-23 (×7): qty 1

## 2019-12-23 MED ORDER — ACETAMINOPHEN 325 MG PO TABS: 650.0000 mg | ORAL_TABLET | Freq: Four times a day (QID) | ORAL | Status: DC | PRN

## 2019-12-23 MED ORDER — MEMANTINE HCL 10 MG PO TABS
10.0000 mg | ORAL_TABLET | Freq: Two times a day (BID) | ORAL | Status: DC
  Administered 2019-12-23 – 2019-12-29 (×12): 10 mg via ORAL
  Filled 2019-12-23 (×13): qty 1

## 2019-12-23 NOTE — ED Notes (Signed)
CT made aware of line placement and pt current bed status. Pt to transport from CT to floor.

## 2019-12-23 NOTE — ED Notes (Signed)
Pt going to CT now, RN is to be made aware when scan is finished for this RN to accompany up to 5W.

## 2019-12-23 NOTE — ED Notes (Addendum)
Pt daughter called, provided update. Pt daughter reports hx of Dementia reporting pt receiving treatment for Dementia x2 years.

## 2019-12-23 NOTE — ED Notes (Signed)
IV team at bedside at this time. 

## 2019-12-23 NOTE — TOC Benefit Eligibility Note (Signed)
Transition of Care [facility]) Benefit Eligibility Note    Patient Details  Name: [name] [name] MRN: [id number] Date of Birth: [birth year]   Medication/Dose: Alveda Reasons 15 MG   and  XARELTO  20 MG DAILY      RIVAROXABAN : NON-FORMULARY  Covered?: Yes  Tier: 3 Drug     Spoke with Person/Company/Phone Number:: SUSIE   @  [employer] #  [phone]  Co-Pay: $45.00  Prior Approval: No  Deductible: Met  Additional Notes: ELIQUIS  5 MG BID  : COVER-YES  CO-PAY- $ 45.00 TIER- 3 DRUG  P/A-NO   APIXABAN 10 MG NON-FORMULARY    Memory Argue Phone Number: 12/23/2019, 1:00 PM

## 2019-12-23 NOTE — ED Provider Notes (Addendum)
[facility] EMERGENCY DEPARTMENT Provider Note   CSN: [id number] Arrival date & time: 12/22/19  1746     History Chief Complaint  Patient presents with  . Cough    [name] [name] is a 86 y.o. female.  86 yo F with a chief complaints of cough and fatigue.  This been going on for about a week.  She unfortunately tested positive for the coronavirus.  Has been able to eat and drink.  Was here yesterday after she had a home oxygen saturation of 71%.  Chest x-ray with bilateral infiltrates.  She left before being seen and then checked her results in my chart and came back today.  She denies any trouble breathing at home.  Feels that she has been doing okay at home and would prefer to go home if possible.  She is most concerned that she is unable to cough anything up.  The history is provided by the patient.  Cough Associated symptoms: no chest pain, no chills, no fever, no headaches, no myalgias, no rhinorrhea, no shortness of breath and no wheezing   Illness Severity:  Moderate Onset quality:  Gradual Duration:  1 week Timing:  Constant Progression:  Worsening Chronicity:  New Associated symptoms: cough   Associated symptoms: no chest pain, no congestion, no fever, no headaches, no myalgias, no nausea, no rhinorrhea, no shortness of breath, no vomiting and no wheezing        Past Medical History:  Diagnosis Date  . Aortic stenosis   . Atrial fibrillation ([place])   . Carotid artery bruit 07/22/2012  . Chest pain    myoview 04/24/07-mild-mod perfusion defect with mild-mod superimposed ischemiain the mid inferior, apicla inferior, basal inferolateral and mid inferolateral regions  . Community acquired pneumonia 01/13/2013  . Edema   . Gallstone   . Generalized osteoarthritis   . HTN (hypertension)   . Hyperlipidemia   . Hypochloremia 01/13/2013  . Hyponatremia 01/13/2013  . IFG (impaired fasting glucose)   . Mild neurocognitive disorder 02/26/2019  . Nonspecific  abnormal electrocardiogram (ECG) (EKG)   . Normocytic anemia 08/26/2018  . Porokeratosis 09/02/2018  . Premature atrial contractions 07/22/2012  . Renal insufficiency 08/26/2018  . Rhabdomyolysis 08/26/2018  . Rheumatoid arthritis ([place]) 01/13/2013  . Vertigo    as late effect of cerebrovascular disease    Patient Active Problem List   Diagnosis Date Noted  . Pneumonia due to COVID-19 virus 12/23/2019  . Slow heart rate 03/13/2019  . Mild neurocognitive disorder 02/26/2019  . [place] 01/20/2019  . Porokeratosis 09/02/2018  . Corns and callosities 09/02/2018  . Elevated CK   . Pressure injury of skin 08/27/2018  . Atrial fibrillation ([place]) 08/27/2018  . Hypernatremia   . Rhabdomyolysis 08/26/2018  . Normocytic anemia 08/26/2018  . Renal insufficiency 08/26/2018  . Community acquired pneumonia 01/13/2013  . Sepsis ([place]) 01/13/2013  . Hyponatremia 01/13/2013  . Hypochloremia 01/13/2013  . Rheumatoid arthritis ([place]) 01/13/2013  . Premature atrial contractions 07/22/2012  . Hyperlipidemia 07/22/2012  . Carotid artery bruit 07/22/2012  . Aortic stenosis 07/22/2012  . Chest pain   . HTN (hypertension)     Past Surgical History:  Procedure Laterality Date  . APPENDECTOMY    . BUNIONECTOMY Bilateral   . cataract surgery Bilateral 05/2016  . gallstones removal    . TONSILLECTOMY       OB History   No obstetric history on file.     Family History  Problem Relation Age of Onset  .  Diabetes Father   . Seizures Brother   . Alzheimer's disease Sister   . Cancer Sister   . Breast cancer Sister   . Cancer Sister   . Seizures Daughter   . Seizures Son   . Seizures Granddaughter   . Ovarian cancer Granddaughter   . Lung cancer Nephew   . Breast cancer Niece   . Breast cancer Niece   . Brain cancer Niece     Social History   Tobacco Use  . Smoking status: Never Smoker  . Smokeless tobacco: Never Used  Vaping Use  . Vaping Use: Never used  Substance Use Topics  . Alcohol  use: No  . Drug use: No    Home Medications Prior to Admission medications   Medication Sig Start Date End Date Taking? Authorizing Provider  aspirin EC 81 MG tablet Take 81 mg by mouth daily.    [provider]  calcium-vitamin D (OSCAL WITH D) 500-200 MG-UNIT per tablet Take 1 tablet by mouth daily.    [provider]  donepezil (ARICEPT) 10 MG tablet Take 10 mg by mouth at bedtime. 12/04/17   [provider]  hydrochlorothiazide (HYDRODIURIL) 25 MG tablet Take 25 mg by mouth daily.    [provider]  memantine (NAMENDA) 10 MG tablet Take 1 tablet (10 mg total) by mouth 2 (two) times daily. 10/28/19   Lomax, Amy, NP  mirabegron ER (MYRBETRIQ) 25 MG TB24 tablet Take 25 mg by mouth daily.    [provider]  oxybutynin (DITROPAN-XL) 10 MG 24 hr tablet Take 10 mg by mouth daily. 01/19/19   [provider]  rosuvastatin (CRESTOR) 20 MG tablet Take 20 mg by mouth daily.    [provider]  sertraline (ZOLOFT) 100 MG tablet Take 100 mg by mouth daily.  12/04/17   [provider]    Allergies    Codeine  Review of Systems   Review of Systems  Constitutional: Negative for chills and fever.  HENT: Negative for congestion and rhinorrhea.   Eyes: Negative for redness and visual disturbance.  Respiratory: Positive for cough. Negative for shortness of breath and wheezing.   Cardiovascular: Negative for chest pain and palpitations.  Gastrointestinal: Negative for nausea and vomiting.  Genitourinary: Negative for dysuria and urgency.  Musculoskeletal: Negative for arthralgias and myalgias.  Skin: Negative for pallor and wound.  Neurological: Negative for dizziness and headaches.    Physical Exam Updated Vital Signs BP (!) 141/115   Pulse (!) 56   Temp 97.8 F (36.6 C) (Oral)   Resp (!) 30   SpO2 96%   Physical Exam Vitals and nursing note reviewed.  Constitutional:      General: She is not in acute distress.     Appearance: She is well-developed. She is not diaphoretic.  HENT:     Head: Normocephalic and atraumatic.  Eyes:     Pupils: Pupils are equal, round, and reactive to light.  Cardiovascular:     Rate and Rhythm: Normal rate and regular rhythm.     Heart sounds: No murmur heard.  No friction rub. No gallop.   Pulmonary:     Effort: Pulmonary effort is normal.     Breath sounds: No wheezing or rales.  Abdominal:     General: There is no distension.     Palpations: Abdomen is soft.     Tenderness: There is no abdominal tenderness.  Musculoskeletal:        General: No tenderness.  Cervical back: Normal range of motion and neck supple.  Skin:    General: Skin is warm and dry.  Neurological:     Mental Status: She is alert and oriented to person, place, and time.  Psychiatric:        Behavior: Behavior normal.     ED Results / Procedures / Treatments   Labs (all labs ordered are listed, but only abnormal results are displayed) Labs Reviewed  CBC WITH DIFFERENTIAL/PLATELET - Abnormal; Notable for the following components:      Result Value   WBC 13.0 (*)    RBC 3.83 (*)    Hemoglobin 11.5 (*)    HCT 35.9 (*)    Platelets 474 (*)    Neutro Abs 9.6 (*)    Abs Immature Granulocytes 0.23 (*)    All other components within normal limits  COMPREHENSIVE METABOLIC PANEL - Abnormal; Notable for the following components:   Potassium 2.7 (*)    Chloride 96 (*)    Glucose, Bld 175 (*)    BUN 28 (*)    Creatinine, Ser 1.14 (*)    Albumin 2.4 (*)    AST 62 (*)    ALT 48 (*)    GFR, Estimated 46 (*)    All other components within normal limits  D-DIMER, QUANTITATIVE (NOT AT [facility]) - Abnormal; Notable for the following components:   D-Dimer, Quant 6.18 (*)    All other components within normal limits  LACTATE DEHYDROGENASE - Abnormal; Notable for the following components:   LDH 279 (*)    All other components within normal limits  FIBRINOGEN - Abnormal; Notable for the following  components:   Fibrinogen 798 (*)    All other components within normal limits  CULTURE, BLOOD (ROUTINE X 2)  CULTURE, BLOOD (ROUTINE X 2)  LACTIC ACID, PLASMA  PROCALCITONIN  LACTIC ACID, PLASMA  TRIGLYCERIDES  C-REACTIVE PROTEIN  FERRITIN  I-STAT CHEM 8, ED    EKG EKG Interpretation  Date/Time:  Wednesday December 23 2019 03:32:52 EDT Ventricular Rate:  111 PR Interval:    QRS Duration: 105 QT Interval:  377 QTC Calculation: 513 R Axis:   29 Text Interpretation: Atrial fibrillation Probable LVH with secondary repol abnrm Prolonged QT interval Otherwise no significant change Confirmed by Deno Etienne [phone]) on 12/23/2019 3:42:42 AM   Radiology DG Chest Portable 1 View  Result Date: 12/21/2019 CLINICAL DATA:  Shortness of breath.  COVID for a week. EXAM: PORTABLE CHEST 1 VIEW COMPARISON:  12/16/2019. FINDINGS: Enlarged cardiac silhouette. Aortic atherosclerosis. No visible pneumothorax. New bibasilar airspace opacities. Limited evaluation for pleural effusions given bibasilar opacities and AP portable technique. No acute osseous abnormality. IMPRESSION: 1. New bibasilar airspace opacities, concerning for multifocal pneumonia. 2. Cardiomegaly. Electronically Signed   By: Margaretha Sheffield MD   On: 12/21/2019 13:18    Procedures Procedures (including critical care time)  Medications Ordered in ED Medications  dexamethasone (DECADRON) injection 6 mg (has no administration in time range)  remdesivir 200 mg in sodium chloride 0.9% 250 mL IVPB (has no administration in time range)    Followed by  remdesivir 100 mg in sodium chloride 0.9 % 100 mL IVPB (has no administration in time range)    ED Course  I have reviewed the triage vital signs and the nursing notes.  Pertinent labs & imaging results that were available during my care of the patient were reviewed by me and considered in my medical decision making (see chart for details).  Clinical  Course as of Dec 23 511  Wed Dec 23, 2019  0233 Ambulated with hypoxia to the 70's. Needed assistance.  No assisting device at home.    [DF]    Clinical Course User Index [DF] Deno Etienne, DO   MDM Rules/Calculators/A&P                          86 yo F with a chief complaints of cough and fatigue.  This been going on for about a week now.  Patient had tested positive for the novel coronavirus.  No hypoxia documented here.  Will ambulate with pulse ox.  Patient denies any shortness of breath.  I discussed with her about her chest x-ray results yesterday.  We will give follow-up for Covid clinic if can discharge today.  Patient unfortunately was hypoxic with minimal exertion.  Went down to the low 70s.  Improved at rest.  Discussed with medicine for admission.  CRITICAL CARE Performed by: Cecilio Asper   Total critical care time: 35 minutes  Critical care time was exclusive of separately billable procedures and treating other patients.  Critical care was necessary to treat or prevent imminent or life-threatening deterioration.  Critical care was time spent personally by me on the following activities: development of treatment plan with patient and/or surrogate as well as nursing, discussions with consultants, evaluation of patient's response to treatment, examination of patient, obtaining history from patient or surrogate, ordering and performing treatments and interventions, ordering and review of laboratory studies, ordering and review of radiographic studies, pulse oximetry and re-evaluation of patient's condition.  The patients results and plan were reviewed and discussed.   Any x-rays performed were independently reviewed by myself.   Differential diagnosis were considered with the presenting HPI.  Medications  dexamethasone (DECADRON) injection 6 mg (has no administration in time range)  remdesivir 200 mg in sodium chloride 0.9% 250 mL IVPB (has no administration in time range)    Followed by    remdesivir 100 mg in sodium chloride 0.9 % 100 mL IVPB (has no administration in time range)    Vitals:   12/23/19 0057 12/23/19 0130 12/23/19 0200 12/23/19 0430  BP: (!) 149/101 (!) 143/111 (!) 135/92 (!) 141/115  Pulse: 94 (!) 44 68 (!) 56  Resp: 20 (!) 31 (!) 21 (!) 30  Temp: 97.8 F (36.6 C)     TempSrc: Oral     SpO2: 96% 91% 99% 96%    Final diagnoses:  Acute on chronic respiratory failure with hypoxia (HCC)  Pneumonia due to COVID-19 virus    Admission/ observation were discussed with the admitting physician, patient and/or family and they are comfortable with the plan.    Final Clinical Impression(s) / ED Diagnoses Final diagnoses:  Acute on chronic respiratory failure with hypoxia ([place])  Pneumonia due to COVID-19 virus    Rx / DC Orders ED Discharge Orders    None       Deno Etienne, DO 12/23/19 [place], Garnet, DO 12/23/19 [phone]

## 2019-12-23 NOTE — H&P (Signed)
History and Physical    [name] [name] KDX:[id number] DOB: 06/13/1928 DOA: 12/22/2019  PCP: Merrilee Seashore, MD Patient coming from: Home  Chief Complaint: Cough  HPI: [name] [name] is a 86 y.o. female with medical history significant of paroxysmal A. fib not on anticoagulation, aortic stenosis, hypertension, hyperlipidemia, rheumatoid arthritis, dementia, depression presenting the ED with with a chief complaint of cough.  Patient recently tested positive for COVID-19 during ED visit on 12/16/2019.  She did not have any hypoxemia at that time and was discharged.  Subsequently received outpatient bamlanivimab/etesevimab infusion on 12/18/2019.  Patient was then sent to the ED by her PCP on 12/21/2019 for evaluation of low oxygen saturation of 71% on room air.  She was satting 97% on room air at triage with no signs of respiratory distress.  Chest x-ray revealed new bibasilar airspace opacities concerning for multifocal pneumonia.  Patient left the ED before being evaluated by a physician.    Patient states she was sent to the ED by her doctor who told her she had pneumonia.  She has no complaints other than a cough.  Denies fevers, chills, body aches, fatigue, shortness of breath, chest pain, nausea, vomiting, abdominal pain, diarrhea, or dysuria.  No additional history could be obtained from her.  ED Course: Afebrile.  Not hypoxic at rest but sats dropped to 70s on room air with ambulation.  WBC 13.0, hemoglobin 11.5 (at baseline), platelet 474k.  Sodium 137, potassium 2.7, chloride 96, bicarb 28, BUN 28, creatinine 1.1, glucose 175.  AST 62, ALT 48.  Alk phos and T bili normal.  Lactic acid level normal.  Procalcitonin <0.10.  Inflammatory markers elevated: D-dimer 6.18, LDH 279, fibrinogen 798.  Ferritin and CRP levels pending.  Blood culture x2 pending.  Patient received IV Decadron 6 mg and remdesivir.  Review of Systems:  All systems reviewed and apart from history of presenting illness, are  negative.  Past Medical History:  Diagnosis Date  . Aortic stenosis   . Atrial fibrillation ([place])   . Carotid artery bruit 07/22/2012  . Chest pain    myoview 04/24/07-mild-mod perfusion defect with mild-mod superimposed ischemiain the mid inferior, apicla inferior, basal inferolateral and mid inferolateral regions  . Community acquired pneumonia 01/13/2013  . Edema   . Gallstone   . Generalized osteoarthritis   . HTN (hypertension)   . Hyperlipidemia   . Hypochloremia 01/13/2013  . Hyponatremia 01/13/2013  . IFG (impaired fasting glucose)   . Mild neurocognitive disorder 02/26/2019  . Nonspecific abnormal electrocardiogram (ECG) (EKG)   . Normocytic anemia 08/26/2018  . Porokeratosis 09/02/2018  . Premature atrial contractions 07/22/2012  . Renal insufficiency 08/26/2018  . Rhabdomyolysis 08/26/2018  . Rheumatoid arthritis ([place]) 01/13/2013  . Vertigo    as late effect of cerebrovascular disease    Past Surgical History:  Procedure Laterality Date  . APPENDECTOMY    . BUNIONECTOMY Bilateral   . cataract surgery Bilateral 05/2016  . gallstones removal    . TONSILLECTOMY       reports that she has never smoked. She has never used smokeless tobacco. She reports that she does not drink alcohol and does not use drugs.  Allergies  Allergen Reactions  . Codeine Nausea And Vomiting and Other (See Comments)    Dizziness    Family History  Problem Relation Age of Onset  . Diabetes Father   . Seizures Brother   . Alzheimer's disease Sister   . Cancer Sister   . Breast cancer Sister   .  Cancer Sister   . Seizures Daughter   . Seizures Son   . Seizures Granddaughter   . Ovarian cancer Granddaughter   . Lung cancer Nephew   . Breast cancer Niece   . Breast cancer Niece   . Brain cancer Niece     Prior to Admission medications   Medication Sig Start Date End Date Taking? Authorizing Provider  aspirin EC 81 MG tablet Take 81 mg by mouth daily.    [provider]    calcium-vitamin D (OSCAL WITH D) 500-200 MG-UNIT per tablet Take 1 tablet by mouth daily.    [provider]  donepezil (ARICEPT) 10 MG tablet Take 10 mg by mouth at bedtime. 12/04/17   [provider]  hydrochlorothiazide (HYDRODIURIL) 25 MG tablet Take 25 mg by mouth daily.    [provider]  memantine (NAMENDA) 10 MG tablet Take 1 tablet (10 mg total) by mouth 2 (two) times daily. 10/28/19   Lomax, Amy, NP  mirabegron ER (MYRBETRIQ) 25 MG TB24 tablet Take 25 mg by mouth daily.    [provider]  oxybutynin (DITROPAN-XL) 10 MG 24 hr tablet Take 10 mg by mouth daily. 01/19/19   [provider]  rosuvastatin (CRESTOR) 20 MG tablet Take 20 mg by mouth daily.    [provider]  sertraline (ZOLOFT) 100 MG tablet Take 100 mg by mouth daily.  12/04/17   [provider]    Physical Exam: Vitals:   12/23/19 0130 12/23/19 0200 12/23/19 0430 12/23/19 0500  BP: (!) 143/111 (!) 135/92 (!) 141/115   Pulse: (!) 44 68 (!) 56   Resp: (!) 31 (!) 21 (!) 30   Temp:      TempSrc:      SpO2: 91% 99% 96%   Weight:    83 kg  Height:    _0  (1.676 m)    Physical Exam Constitutional:      General: She is not in acute distress. HENT:     Head: Normocephalic.  Eyes:     Extraocular Movements: Extraocular movements intact.     Conjunctiva/sclera: Conjunctivae normal.  Cardiovascular:     Rate and Rhythm: Tachycardia present. Rhythm irregular.     Pulses: Normal pulses.     Comments: Slightly tachycardic Pulmonary:     Breath sounds: Rales present. No wheezing.     Comments: Slightly tachypneic Abdominal:     General: Bowel sounds are normal. There is no distension.     Palpations: Abdomen is soft.     Tenderness: There is no abdominal tenderness.  Musculoskeletal:        General: No swelling or tenderness.     Cervical back: Normal range of motion and neck supple.  Skin:    General: Skin is warm and dry.  Neurological:      General: No focal deficit present.     Mental Status: She is alert and oriented to person, place, and time.     Labs on Admission: I have personally reviewed following labs and imaging studies  CBC: Recent Labs  Lab 12/16/19 2228 12/23/19 0224  WBC 9.6 13.0*  NEUTROABS 6.8 9.6*  HGB 11.6* 11.5*  HCT 36.7 35.9*  MCV 94.8 93.7  PLT 243 832*   Basic Metabolic Panel: Recent Labs  Lab 12/16/19 2228 12/23/19 0224  NA 135 137  K 3.1* 2.7*  CL 100 96*  CO2 24 28  GLUCOSE 127* 175*  BUN 46* 28*  CREATININE 1.26* 1.14*  CALCIUM 8.7* 9.1   GFR: Estimated Creatinine Clearance: 36.3 mL/min (A) (by C-G formula based on SCr of 1.14 mg/dL (H)). Liver Function Tests: Recent Labs  Lab 12/16/19 2228 12/23/19 0224  AST 44* 62*  ALT 34 48*  ALKPHOS 34* 57  BILITOT 1.1 0.8  PROT 6.3* 7.1  ALBUMIN 2.9* 2.4*   No results for input(s): LIPASE, AMYLASE in the last 168 hours. No results for input(s): AMMONIA in the last 168 hours. Coagulation Profile: No results for input(s): INR, PROTIME in the last 168 hours. Cardiac Enzymes: No results for input(s): CKTOTAL, CKMB, CKMBINDEX, TROPONINI in the last 168 hours. BNP (last 3 results) No results for input(s): PROBNP in the last 8760 hours. HbA1C: No results for input(s): HGBA1C in the last 72 hours. CBG: No results for input(s): GLUCAP in the last 168 hours. Lipid Profile: No results for input(s): CHOL, HDL, LDLCALC, TRIG, CHOLHDL, LDLDIRECT in the last 72 hours. Thyroid Function Tests: No results for input(s): TSH, T4TOTAL, FREET4, T3FREE, THYROIDAB in the last 72 hours. Anemia Panel: No results for input(s): VITAMINB12, FOLATE, FERRITIN, TIBC, IRON, RETICCTPCT in the last 72 hours. Urine analysis:    Component Value Date/Time   COLORURINE YELLOW 08/27/2018 0251   APPEARANCEUR HAZY (A) 08/27/2018 0251   LABSPEC 1.024 08/27/2018 0251   PHURINE 6.0 08/27/2018 0251   GLUCOSEU NEGATIVE 08/27/2018 0251   HGBUR MODERATE (A)  08/27/2018 0251   BILIRUBINUR NEGATIVE 08/27/2018 0251   KETONESUR 20 (A) 08/27/2018 0251   PROTEINUR 30 (A) 08/27/2018 0251   UROBILINOGEN 0.2 01/13/2013 1712   NITRITE NEGATIVE 08/27/2018 0251   LEUKOCYTESUR NEGATIVE 08/27/2018 0251    Radiological Exams on Admission: DG Chest Portable 1 View  Result Date: 12/21/2019 CLINICAL DATA:  Shortness of breath.  COVID for a week. EXAM: PORTABLE CHEST 1 VIEW COMPARISON:  12/16/2019. FINDINGS: Enlarged cardiac silhouette. Aortic atherosclerosis. No visible pneumothorax. New bibasilar airspace opacities. Limited evaluation for pleural effusions given bibasilar opacities and AP portable technique. No acute osseous abnormality. IMPRESSION: 1. New bibasilar airspace opacities, concerning for multifocal pneumonia. 2. Cardiomegaly. Electronically Signed   By: Margaretha Sheffield MD   On: 12/21/2019 13:18    EKG: Independently reviewed.  A. fib, heart rate 111.  QTc 513.  Assessment/Plan Principal Problem:   Pneumonia due to COVID-19 virus Active Problems:   Atrial fibrillation (HCC)   Acute hypoxemic respiratory failure (HCC)   Hypokalemia   Transaminitis   Acute hypoxemic respiratory failure secondary to COVID-19 viral pneumonia Tested positive for COVID-19 on 12/16/2019.  Chest x-ray done during ED visit on 12/21/2019 revealed new bibasilar airspace opacities concerning for multifocal pneumonia.  At present, patient is not hypoxic at rest but desats to the 70s on room air with ambulation.  Procalcitonin <0.10.  Inflammatory markers elevated: D-dimer 6.18, LDH 279, fibrinogen 798.  Ferritin and CRP levels pending.  Not hypoxemic at rest but oxygen saturation dropped to the 70s on room air with ambulation. -CT angiogram chest ordered to rule out PE given significantly elevated D-dimer and increased risk of thromboembolism with COVID-19 viral infection.  Start therapeutic dose Lovenox. -Remdesivir -IV Decadron 6 mg daily -Received outpatient  bamlanivimab/etesevimab infusion on 12/18/2019 -Vitamin C, zinc, vitamin D -Robitussin-DM as needed for cough -Bronchodilator as needed -Daily CBC with differential, CMP, CRP, D-dimer -Airborne and contact precautions -Incentive spirometry, flutter valve -Encourage prone positioning -Continuous pulse ox -Supplemental oxygen as needed to keep oxygen saturation above 90% -Blood culture x2 pending  Hypokalemia Likely due to poor oral  intake in the setting of acute viral illness and home diuretic use.  Potassium 2.7, EKG without acute changes. -Hold diuretic at this time.  Replete potassium.  Check magnesium level and replete if low.  Continue to monitor electrolytes.  Mild transaminitis Likely due to acute viral illness.  AST 62, ALT 48.  Alk phos and T bili normal. -Continue to monitor LFTs  Paroxysmal A. Fib CHA2DS2VASc at least 4.  Not on chronic anticoagulation likely due to advanced age and fall risk.  Not on a rate control agent at home.  Currently in A. fib with rate 100-110.  Likely precipitating factor is hypoxemia/acute viral pneumonia. -Cardiac monitoring. IV Lopressor PRN HR >120.   Hypertension Stable. -Resume home meds after pharmacy med rec is done  Hyperlipidemia -Resume statin after pharmacy med rec is done  Dementia -Resume home meds after pharmacy med rec is done.  Follow delirium precautions.  Depression -Resume home antidepressant after pharmacy med rec is done  DVT prophylaxis: Lovenox Code Status: Full code-discussed with the patient. Family Communication: No family available at this time. Disposition Plan: Status is: Inpatient  Remains inpatient appropriate because:IV treatments appropriate due to intensity of illness or inability to take PO, Inpatient level of care appropriate due to severity of illness and Hypoxemia with ambulation, COVID-19 pneumonia   Dispo: The patient is from: Home              Anticipated d/c is to: Home               Anticipated d/c date is: 3 days              Patient currently is not medically stable to d/c.  The medical decision making on this patient was of high complexity and the patient is at high risk for clinical deterioration, therefore this is a level 3 visit.  Shela Leff MD [facility]  If 7PM-7AM, please contact night-coverage [email or web address]  12/23/2019, 5:36 AM

## 2019-12-23 NOTE — Progress Notes (Signed)
   12/23/19 0823  Assess: MEWS Score  Temp (!) 97.5 F (36.4 C)  BP (!) 154/104  Pulse Rate 76  ECG Heart Rate (!) 111  Resp (!) 25  SpO2 95 %  O2 Device Room Air  Assess: MEWS Score  MEWS Temp 0  MEWS Systolic 0  MEWS Pulse 2  MEWS RR 1  MEWS LOC 0  MEWS Score 3  MEWS Score Color Yellow  Assess: if the MEWS score is Yellow or Red  Were vital signs taken at a resting state? Yes  Focused Assessment No change from prior assessment  Early Detection of Sepsis Score *See Row Information* Low  MEWS guidelines implemented *See Row Information* Yes  Treat  MEWS Interventions Other (Comment)  Pain Scale 0-10  Pain Score 0  Take Vital Signs  Increase Vital Sign Frequency  Yellow: Q 2hr X 2 then Q 4hr X 2, if remains yellow, continue Q 4hrs  Escalate  MEWS: Escalate Yellow: discuss with charge nurse/RN and consider discussing with provider and RRT  Notify: Charge Nurse/RN  Name of Charge Nurse/RN Notified Elisa CRN  Date Charge Nurse/RN Notified 12/23/19  Time Charge Nurse/RN Notified [phone]

## 2019-12-23 NOTE — Progress Notes (Signed)
PROGRESS NOTE                                                                             PROGRESS NOTE                                                                                                                                                                                                             Patient Demographics:    [name] [name], is a 86 y.o. female, DOB - December 11, 1928, MQK:[id number]  Outpatient Primary MD for the patient is Merrilee Seashore, MD    LOS - 0  Admit date - 12/22/2019    Chief Complaint  Patient presents with  . Cough       Brief Narrative    This is a no charge note as patient was seen and admitted earlier today by Dr. Marlowe Sax, chart, imaging and labs were reviewed.  HPI: [name] [name] is a 86 y.o. female with medical history significant of paroxysmal A. fib not on anticoagulation, aortic stenosis, hypertension, hyperlipidemia, rheumatoid arthritis, dementia, depression presenting the ED with with a chief complaint of cough.  Patient recently tested positive for COVID-19 during ED visit on 12/16/2019.  She did not have any hypoxemia at that time and was discharged.  Subsequently received outpatient bamlanivimab/etesevimab infusion on 12/18/2019.  Patient was then sent to the ED by her PCP on 12/21/2019 for evaluation of low oxygen saturation of 71% on room air.  She was satting 97% on room air at triage with no signs of respiratory distress.  Chest x-ray revealed new bibasilar airspace opacities concerning for multifocal pneumonia.  Patient left the ED before being evaluated by a physician.    Patient states she was sent to the ED by her doctor who told her she had pneumonia.  She has no complaints other than a cough.  Denies fevers, chills, body aches, fatigue, shortness of breath, chest pain, nausea, vomiting, abdominal pain, diarrhea, or dysuria.  No additional history could be obtained from her.  ED Course: Afebrile.  Not  hypoxic at rest but sats dropped to 70s on room air with ambulation.  WBC 13.0, hemoglobin 11.5 (at baseline), platelet 474k.  Sodium 137, potassium 2.7, chloride 96, bicarb 28, BUN 28, creatinine 1.1, glucose 175.  AST 62, ALT 48.  Alk phos and T bili normal.  Lactic acid level normal.  Procalcitonin <0.10.  Inflammatory markers elevated: D-dimer 6.18, LDH 279, fibrinogen 798.  Ferritin and CRP levels pending.  Blood culture x2 pending.  Patient received IV Decadron 6 mg and remdesivir.     Subjective:    Cira Rue today has, No headache, No chest pain, No abdominal pain    Assessment  & Plan :    Principal Problem:   Pneumonia due to COVID-19 virus Active Problems:   Atrial fibrillation (HCC)   Acute hypoxemic respiratory failure (HCC)   Hypokalemia   Transaminitis   COVID-19 viral pneumonia -She is vaccinated with Mederma and Avapro. -Wheezing significant for bilateral opacities. -Continue with steroids and Remdesivir. -Dimer is elevated, CTA chest with no evidence of PE, will check venous Dopplers, meanwhile keep on full dose anticoagulation and continue to trend D-dimers. -Continue to trend inflammatory markers -She was encouraged to use incentive spirometry, flutter valve, out of bed to chair. -Received outpatient bamlanivimab/etesevimab infusion on 12/18/2019  Hypokalemia -Repleted, continue to monitor closely  Mild transaminitis Likely due to acute viral illness.  AST 62, ALT 48.  Alk phos and T bili normal. -Continue to monitor LFTs  Paroxysmal A. Fib CHA2DS2VASc at least 4.  Not on chronic anticoagulation likely due to advanced age and fall risk.  Not on a rate control agent at home. -She is currently in A. fib with RVR heart rate in the 140s, she will be started on the Cardizem drip. -Full dose Lovenox as well.  Hypertension -Resume home meds  Hyperlipidemia -Resume statin   Dementia -Resume home meds   Depression -Resume home meds  SpO2: 97  %  Recent Labs  Lab 12/16/19 1834 12/16/19 2228 12/23/19 0224 12/23/19 0956 12/23/19 1047  WBC  --  9.6 13.0*  --   --   PLT  --  243 474*  --   --   CRP  --   --   --  19.3*  --   DDIMER  --   --  6.18*  --   --   PROCALCITON  --   --  <0.10  --   --   AST  --  44* 62*  --   --   ALT  --  34 48*  --   --   ALKPHOS  --  34* 57  --   --   BILITOT  --  1.1 0.8  --   --   ALBUMIN  --  2.9* 2.4*  --   --   LATICACIDVEN  --   --  1.7  --  1.7  SARSCOV2NAA POSITIVE*  --   --   --   --        ABG  No results found for: PHART, PCO2ART, PO2ART, HCO3, TCO2, ACIDBASEDEF, O2SAT         Condition - Extremely Guarded  Code Status :  Full  Consults  :  none  Procedures  :  none  PUD Prophylaxis : protonix  Disposition Plan  :    Status is: Inpatient  Remains inpatient appropriate because:Persistent severe electrolyte disturbances and IV treatments appropriate due to intensity of illness or inability to take PO  Dispo: The patient is from: Home              Anticipated d/c is to: Home              Anticipated d/c date is: 1 day              Patient currently is not medically stable to d/c.      DVT Prophylaxis  : Full dose Lovenox  Lab Results  Component Value Date   PLT 474 (H) 12/23/2019    Diet :  Diet Order            Diet Heart Room service appropriate? Yes; Fluid consistency: Thin  Diet effective now                  Inpatient Medications  Scheduled Meds: . vitamin C  500 mg Oral Daily  . cholecalciferol  1,000 Units Oral Daily  . [START ON 12/24/2019] dexamethasone (DECADRON) injection  6 mg Intravenous Q24H  . enoxaparin (LOVENOX) injection  80 mg Subcutaneous Q12H  . zinc sulfate  220 mg Oral Daily   Continuous Infusions: . diltiazem (CARDIZEM) infusion 10 mg/hr (12/23/19 1332)  . [START ON 12/24/2019] remdesivir 100 mg in NS 100 mL     PRN Meds:.acetaminophen, albuterol, guaiFENesin-dextromethorphan, metoprolol  tartrate  Antibiotics  :    Anti-infectives (From admission, onward)   Start     Dose/Rate Route Frequency Ordered Stop   12/24/19 1000  remdesivir 100 mg in sodium chloride 0.9 % 100 mL IVPB       "Followed by" Linked Group Details   100 mg 200 mL/hr over 30 Minutes Intravenous Daily 12/23/19 0439 12/28/19 0959   12/23/19 0530  remdesivir 200 mg in sodium chloride 0.9% 250 mL IVPB       "Followed by" Linked Group Details   200 mg 580 mL/hr over 30 Minutes Intravenous Once 12/23/19 0439 12/23/19 0630        Shirell Struthers M.D on 12/23/2019 at 2:41 PM  To page go to [email or web address]   [facility] -  Office  [phone]      Objective:   Vitals:   12/23/19 1254 12/23/19 1331 12/23/19 1433 12/23/19 1437  BP: (!) 161/113 (!) 157/110 (!) 171/89 (!) 151/101  Pulse: (!) 114  99   Resp: (!) 24 (!) 24 (!) 25   Temp: 97.6 F (36.4 C) 97.6 F (36.4 C) 97.9 F (36.6 C)   TempSrc: Oral Axillary    SpO2: 96% 95% 97%   Weight:      Height:        Wt Readings from Last 3 Encounters:  12/23/19 83 kg  10/28/19 84.5 kg  04/23/19 78 kg     Intake/Output Summary (Last 24 hours) at 12/23/2019 1441 Last data filed at 12/23/2019 1100 Gross per 24 hour  Intake 550 ml  Output --  Net 550 ml     Physical Exam  Awake Alert, No new F.N deficits, Normal affect Symmetrical Chest wall movement, Good air movement bilaterally, CTAB Irr Irr,No Gallops,Rubs or new Murmurs, No Parasternal Heave +ve B.Sounds, Abd Soft, No tenderness, No organomegaly appriciated, No rebound - guarding or rigidity. No Cyanosis, Clubbing or edema, No new Rash or bruise      Data Review:    CBC Recent Labs  Lab 12/16/19 2228 12/23/19 0224  WBC 9.6 13.0*  HGB 11.6* 11.5*  HCT 36.7 35.9*  PLT 243 474*  MCV 94.8 93.7  MCH 30.0  30.0  MCHC 31.6 32.0  RDW 14.2 14.0  LYMPHSABS 1.5 2.1  MONOABS 1.1* 1.0  EOSABS 0.0 0.1  BASOSABS 0.0 0.0    Recent Labs  Lab 12/16/19 2228 12/23/19 0224  12/23/19 0956 12/23/19 1047  NA 135 137  --   --   K 3.1* 2.7*  --   --   CL 100 96*  --   --   CO2 24 28  --   --   GLUCOSE 127* 175*  --   --   BUN 46* 28*  --   --   CREATININE 1.26* 1.14*  --   --   CALCIUM 8.7* 9.1  --   --   AST 44* 62*  --   --   ALT 34 48*  --   --   ALKPHOS 34* 57  --   --   BILITOT 1.1 0.8  --   --   ALBUMIN 2.9* 2.4*  --   --   MG  --   --  1.8 1.8  CRP  --   --  19.3*  --   DDIMER  --  6.18*  --   --   PROCALCITON  --  <0.10  --   --   LATICACIDVEN  --  1.7  --  1.7    ------------------------------------------------------------------------------------------------------------------ Recent Labs    12/23/19 0956  TRIG 71    No results found for: HGBA1C ------------------------------------------------------------------------------------------------------------------ No results for input(s): TSH, T4TOTAL, T3FREE, THYROIDAB in the last 72 hours.  Invalid input(s): FREET3  Cardiac Enzymes No results for input(s): CKMB, TROPONINI, MYOGLOBIN in the last 168 hours.  Invalid input(s): CK ------------------------------------------------------------------------------------------------------------------ No results found for: BNP  Micro Results Recent Results (from the past 240 hour(s))  Respiratory Panel by RT PCR (Flu A&B, Covid) - Nasopharyngeal Swab     Status: Abnormal   Collection Time: 12/16/19  6:34 PM   Specimen: Nasopharyngeal Swab  Result Value Ref Range Status   SARS Coronavirus 2 by RT PCR POSITIVE (A) NEGATIVE Final    Comment: RESULT CALLED TO, READ BACK BY AND VERIFIED WITH: Joline Maxcy RN 12/16/19 2047 JDW (NOTE) SARS-CoV-2 target nucleic acids are DETECTED.  SARS-CoV-2 RNA is generally detectable in upper respiratory specimens  during the acute phase of infection. Positive results are indicative of the presence of the identified virus, but do not rule out bacterial infection or co-infection with other pathogens not detected  by the test. Clinical correlation with patient history and other diagnostic information is necessary to determine patient infection status. The expected result is Negative.  Fact Sheet for Patients:  PinkCheek.be  Fact Sheet for Healthcare Providers: GravelBags.it  This test is not yet approved or cleared by the [place] FDA and  has been authorized for detection and/or diagnosis of SARS-CoV-2 by FDA under an Emergency Use Authorization (EUA).  This EUA will remain in effect (meaning this test can be  used) for the duration of  the COVID-19 declaration under Section 564(b)(1) of the Act, 21 U.S.C. section 360bbb-3(b)(1), unless the authorization is terminated or revoked sooner.      Influenza A by PCR NEGATIVE NEGATIVE Final   Influenza B by PCR NEGATIVE NEGATIVE Final    Comment: (NOTE) The Xpert Xpress SARS-CoV-2/FLU/RSV assay is intended as an aid in  the diagnosis of influenza from Nasopharyngeal swab specimens and  should not be used as a sole basis for treatment. Nasal washings and  aspirates are unacceptable for Xpert Xpress SARS-CoV-2/FLU/RSV  testing.  Fact Sheet for Patients: PinkCheek.be  Fact Sheet for Healthcare Providers: GravelBags.it  This test is not yet approved or cleared by the [place] FDA and  has been authorized for detection and/or diagnosis of SARS-CoV-2 by  FDA under an Emergency Use Authorization (EUA). This EUA will remain  in effect (meaning this test can be used) for the duration of the  Covid-19 declaration under Section 564(b)(1) of the Act, 21  U.S.C. section 360bbb-3(b)(1), unless the authorization is  terminated or revoked. Performed at [facility], [place] [address] [place], [place] [address]     Radiology Reports CT Head Wo Contrast  Result Date: 12/16/2019 CLINICAL DATA:  Headache COVID positive EXAM:  CT HEAD WITHOUT CONTRAST TECHNIQUE: Contiguous axial images were obtained from the base of the skull through the vertex without intravenous contrast. COMPARISON:  None. FINDINGS: Brain: No evidence of acute territorial infarction, hemorrhage, hydrocephalus,extra-axial collection or mass lesion/mass effect. There is dilatation the ventricles and sulci consistent with age-related atrophy. Low-attenuation changes in the deep white matter consistent with small vessel ischemia. Vascular: No hyperdense vessel or unexpected calcification. Skull: The skull is intact. No fracture or focal lesion identified. Sinuses/Orbits: The visualized paranasal sinuses and mastoid air cells are clear. The orbits and globes intact. Other: None IMPRESSION: No acute intracranial abnormality. Findings consistent with age related atrophy and chronic small vessel ischemia Electronically Signed   By: Prudencio Pair M.D.   On: 12/16/2019 23:29   CT ANGIO CHEST PE W OR WO CONTRAST  Result Date: 12/23/2019 CLINICAL DATA:  Short of breath, COVID positive EXAM: CT ANGIOGRAPHY CHEST WITH CONTRAST TECHNIQUE: Multidetector CT imaging of the chest was performed using the standard protocol during bolus administration of intravenous contrast. Multiplanar CT image reconstructions and MIPs were obtained to evaluate the vascular anatomy. CONTRAST:  41m OMNIPAQUE IOHEXOL 350 MG/ML SOLN COMPARISON:  None. FINDINGS: Cardiovascular: Contrast bolus timing is optimal, but there is significant respiratory motion artifact. Evaluation of the pulmonary arteries is poor beyond proximal segmental level. There is no acute pulmonary embolism identified within this limitation. Enlargement of the main pulmonary artery suggesting pulmonary arterial hypertension. Cardiomegaly with moderate pericardial effusion. Reflux of contrast into the hepatic veins suggests right heart dysfunction. Mediastinum/Nodes: No enlarged lymph nodes identified. Lungs/Pleura: Suboptimal  evaluation due to respiratory motion and imaging during expiration. Areas of subsegmental atelectasis. Patchy opacities, which may represent additional atelectasis, edema, or pneumonia. Bronchiectasis at the lung bases. Small bilateral pleural effusions. Pleural fluid extends into the right oblique fissure. Upper Abdomen: No acute abnormality. Musculoskeletal: No acute osseous abnormality. Review of the MIP images confirms the above findings. IMPRESSION: Suboptimal evaluation due to respiratory motion. No acute pulmonary embolism identified to the proximal segmental level. Cardiomegaly with moderate pericardial effusion. Evidence of pulmonary arterial hypertension. Scattered patchy pulmonary opacities, which may reflect atelectasis, edema and/or pneumonia. Given above and small bilateral pleural effusions, edema is favored. Electronically Signed   By: PMacy MisM.D.   On: 12/23/2019 08:16   DG Chest Portable 1 View  Result Date: 12/21/2019 CLINICAL DATA:  Shortness of breath.  COVID for a week. EXAM: PORTABLE CHEST 1 VIEW COMPARISON:  12/16/2019. FINDINGS: Enlarged cardiac silhouette. Aortic atherosclerosis. No visible pneumothorax. New bibasilar airspace opacities. Limited evaluation for pleural effusions given bibasilar opacities and AP portable technique. No acute osseous abnormality. IMPRESSION: 1. New bibasilar airspace opacities, concerning for multifocal pneumonia. 2. Cardiomegaly. Electronically Signed   By: FMargaretha SheffieldMD   On: 12/21/2019 13:18  DG Chest Port 1 View  Result Date: 12/16/2019 CLINICAL DATA:  86 year old female with positive COVID test. EXAM: PORTABLE CHEST 1 VIEW COMPARISON:  Chest radiograph dated 08/26/2018. FINDINGS: There is cardiomegaly with mild vascular congestion. No focal consolidation, pleural effusion, or pneumothorax. Minimal bibasilar densities, likely atelectasis. Atherosclerotic calcification of the aorta. No acute osseous pathology. IMPRESSION:  Cardiomegaly with mild vascular congestion. No focal consolidation. Electronically Signed   By: Anner Crete M.D.   On: 12/16/2019 21:33

## 2019-12-23 NOTE — Care Management (Signed)
Bene check sent for xaralto and eliquis

## 2019-12-23 NOTE — ED Notes (Signed)
Pt ambulated to pt restroom and back to treatment area x1 assist. While ambulating SPO2 drops to 70s on RA, with improvement only at rest Tyrone Nine, MD made aware.

## 2019-12-24 ENCOUNTER — Inpatient Hospital Stay (HOSPITAL_COMMUNITY): Payer: PPO

## 2019-12-24 DIAGNOSIS — I480 Paroxysmal atrial fibrillation: Secondary | ICD-10-CM

## 2019-12-24 LAB — COMPREHENSIVE METABOLIC PANEL
ALT: 50 U/L — ABNORMAL HIGH (ref 0–44)
AST: 59 U/L — ABNORMAL HIGH (ref 15–41)
Albumin: 2.4 g/dL — ABNORMAL LOW (ref 3.5–5.0)
Alkaline Phosphatase: 56 U/L (ref 38–126)
Anion gap: 11 (ref 5–15)
BUN: 24 mg/dL — ABNORMAL HIGH (ref 8–23)
CO2: 24 mmol/L (ref 22–32)
Calcium: 8.8 mg/dL — ABNORMAL LOW (ref 8.9–10.3)
Chloride: 98 mmol/L (ref 98–111)
Creatinine, Ser: 0.98 mg/dL (ref 0.44–1.00)
GFR, Estimated: 55 mL/min — ABNORMAL LOW (ref >60)
Glucose, Bld: 128 mg/dL — ABNORMAL HIGH (ref 70–99)
Potassium: 3.4 mmol/L — ABNORMAL LOW (ref 3.5–5.1)
Sodium: 133 mmol/L — ABNORMAL LOW (ref 135–145)
Total Bilirubin: 0.8 mg/dL (ref 0.3–1.2)
Total Protein: 6.8 g/dL (ref 6.5–8.1)

## 2019-12-24 LAB — CBC WITH DIFFERENTIAL/PLATELET
Abs Immature Granulocytes: 0.23 10*3/uL — ABNORMAL HIGH (ref 0.00–0.07)
Basophils Absolute: 0 10*3/uL (ref 0.0–0.1)
Basophils Relative: 0 %
Eosinophils Absolute: 0.1 10*3/uL (ref 0.0–0.5)
Eosinophils Relative: 0 %
HCT: 31.8 % — ABNORMAL LOW (ref 36.0–46.0)
Hemoglobin: 10.4 g/dL — ABNORMAL LOW (ref 12.0–15.0)
Immature Granulocytes: 2 %
Lymphocytes Relative: 14 %
Lymphs Abs: 2.1 10*3/uL (ref 0.7–4.0)
MCH: 30.1 pg (ref 26.0–34.0)
MCHC: 32.7 g/dL (ref 30.0–36.0)
MCV: 91.9 fL (ref 80.0–100.0)
Monocytes Absolute: 1.2 10*3/uL — ABNORMAL HIGH (ref 0.1–1.0)
Monocytes Relative: 8 %
Neutro Abs: 11.8 10*3/uL — ABNORMAL HIGH (ref 1.7–7.7)
Neutrophils Relative %: 76 %
Platelets: 473 10*3/uL — ABNORMAL HIGH (ref 150–400)
RBC: 3.46 MIL/uL — ABNORMAL LOW (ref 3.87–5.11)
RDW: 13.9 % (ref 11.5–15.5)
WBC: 15.4 10*3/uL — ABNORMAL HIGH (ref 4.0–10.5)
nRBC: 0 % (ref 0.0–0.2)

## 2019-12-24 LAB — BLOOD CULTURE ID PANEL (REFLEXED) - BCID2

## 2019-12-24 LAB — D-DIMER, QUANTITATIVE: D-Dimer, Quant: 6.15 ug/mL-FEU — ABNORMAL HIGH (ref 0.00–0.50)

## 2019-12-24 LAB — C-REACTIVE PROTEIN: CRP: 15.6 mg/dL — ABNORMAL HIGH (ref <1.0)

## 2019-12-24 LAB — MAGNESIUM: Magnesium: 1.7 mg/dL (ref 1.7–2.4)

## 2019-12-24 MED ORDER — DILTIAZEM HCL 60 MG PO TABS
30.0000 mg | ORAL_TABLET | Freq: Four times a day (QID) | ORAL | Status: DC
  Administered 2019-12-24 – 2019-12-25 (×4): 30 mg via ORAL
  Filled 2019-12-24 (×4): qty 1

## 2019-12-24 MED ORDER — POTASSIUM CHLORIDE CRYS ER 20 MEQ PO TBCR
30.0000 meq | EXTENDED_RELEASE_TABLET | ORAL | Status: AC
  Administered 2019-12-24 (×2): 30 meq via ORAL
  Filled 2019-12-24 (×2): qty 1

## 2019-12-24 MED ORDER — MAGNESIUM SULFATE 2 GM/50ML IV SOLN
2.0000 g | Freq: Once | INTRAVENOUS | Status: AC
  Administered 2019-12-24: 2 g via INTRAVENOUS
  Filled 2019-12-24: qty 50

## 2019-12-24 NOTE — Progress Notes (Signed)
PROGRESS NOTE                                                                             PROGRESS NOTE                                                                                                                                                                                                             Patient Demographics:    [name] [name], is a 86 y.o. female, DOB - 05-Jan-1929, JZP:[id number]  Outpatient Primary MD for the patient is Merrilee Seashore, MD    LOS - 1  Admit date - 12/22/2019    Chief Complaint  Patient presents with  . Cough       Brief Narrative    HPI: [name] [name] is a 86 y.o. female with medical history significant of paroxysmal A. fib not on anticoagulation, aortic stenosis, hypertension, hyperlipidemia, rheumatoid arthritis, dementia, depression presenting the ED with with a chief complaint of cough.  Patient recently tested positive for COVID-19 during ED visit on 12/16/2019.  She did not have any hypoxemia at that time and was discharged.  Subsequently received outpatient bamlanivimab/etesevimab infusion on 12/18/2019.  Patient was then sent to the ED by her PCP on 12/21/2019 for evaluation of low oxygen saturation of 71% on room air.  She was satting 97% on room air at triage with no signs of respiratory distress.  Chest x-ray revealed new bibasilar airspace opacities concerning for multifocal pneumonia.  Patient left the ED before being evaluated by a physician.    Patient states she was sent to the ED by her doctor who told her she had pneumonia.  She has no complaints other than a cough.  Denies fevers, chills, body aches, fatigue, shortness of breath, chest pain, nausea, vomiting, abdominal pain, diarrhea, or dysuria.  No additional history could be obtained from her.  ED Course: Afebrile.  Not hypoxic at rest but sats dropped to 70s on room air with ambulation.  WBC 13.0, hemoglobin 11.5 (at baseline), platelet 474k.   Sodium  137, potassium 2.7, chloride 96, bicarb 28, BUN 28, creatinine 1.1, glucose 175.  AST 62, ALT 48.  Alk phos and T bili normal.  Lactic acid level normal.  Procalcitonin <0.10.  Inflammatory markers elevated: D-dimer 6.18, LDH 279, fibrinogen 798.  Ferritin and CRP levels pending.  Blood culture x2 pending.  Patient received IV Decadron 6 mg and remdesivir.     Subjective:    [name] [name] today headache, dyspnea or chest pain.     Assessment  & Plan :    Principal Problem:   Pneumonia due to COVID-19 virus Active Problems:   Atrial fibrillation ([place])   Acute hypoxemic respiratory failure (HCC)   Hypokalemia   Transaminitis   COVID-19 viral pneumonia  -She is vaccinated with Mederma in April . -imaging significant for bilateral opacities. -Continue with steroids and Remdesivir. -D Dimer is elevated, CTA chest with no evidence of PE, will check venous Dopplers, meanwhile keep on full dose anticoagulation and continue to trend D-dimers. -Continue to trend inflammatory markers, CRP trending down, D-dimers remains elevated -She was encouraged to use incentive spirometry, flutter valve, out of bed to chair. -Received outpatient bamlanivimab/etesevimab infusion on 12/18/2019 -He is with elevated D-dimers, CTA with no PE, venous Dopplers with no DVT, will keep on full dose Lovenox till D-dimer less than 2.  COVID-19 Labs  Recent Labs    12/23/19 0224 12/23/19 0956 12/24/19 0244  DDIMER 6.18*  --  6.15*  FERRITIN  --  541*  --   LDH 279*  --   --   CRP  --  19.3* 15.6*    Lab Results  Component Value Date   SARSCOV2NAA POSITIVE (A) 12/16/2019   [place] NEGATIVE 08/26/2018     Hypokalemia -Repleted,   1/4+ blood cultures -Showing staph species, nontoxic-appearing, most likely contamination, continue to follow final results. - pro calcitonin WNL, will monitor off antibiotic  Mild transaminitis Likely due to acute viral illness.  AST 62, ALT 48.  Alk phos and  T bili normal. -Continue to monitor LFTs  Paroxysmal A. Fib CHA2DS2VASc at least 4.  Not on chronic anticoagulation as she only had one single episode during hospital stay in 2020 with no recurrence . -Heart rate uncontrolled on admission, requiring Cardizem drip, she is transition to p.o. Cardizem today, Cardizem drip has been stopped. -Keep K >4, and magnesium > 2.. -Full dose Lovenox as well.  Will discuss with cardiology about starting Eliquis( she had 4 falls this years already, on fall in jun, where she was 6 days on the floor).  Hypertension -Resume home meds  Hyperlipidemia -Resume statin   Dementia -Resume home meds   Depression -Resume home meds  SpO2: 90 %  Recent Labs  Lab 12/23/19 0224 12/23/19 0956 12/23/19 1047 12/24/19 0244  WBC 13.0*  --   --  15.4*  PLT 474*  --   --  473*  CRP  --  19.3*  --  15.6*  DDIMER 6.18*  --   --  6.15*  PROCALCITON <0.10  --   --   --   AST 62*  --   --  59*  ALT 48*  --   --  50*  ALKPHOS 57  --   --  56  BILITOT 0.8  --   --  0.8  ALBUMIN 2.4*  --   --  2.4*  LATICACIDVEN 1.7  --  1.7  --        ABG  No results found for:  PHART, PCO2ART, PO2ART, HCO3, TCO2, ACIDBASEDEF, O2SAT         Condition - Extremely Guarded  Family communication: D/W daughter dianne  Code Status :  Full  Consults  :  none  Procedures  :  none  PUD Prophylaxis : protonix  Disposition Plan  :    Status is: Inpatient  Remains inpatient appropriate because:Persistent severe electrolyte disturbances and IV treatments appropriate due to intensity of illness or inability to take PO   Dispo: The patient is from: Home              Anticipated d/c is to: Home              Anticipated d/c date is: 1 day              Patient currently is not medically stable to d/c.      DVT Prophylaxis  : Full dose Lovenox  Lab Results  Component Value Date   PLT 473 (H) 12/24/2019    Diet :  Diet Order            Diet Heart Room  service appropriate? Yes; Fluid consistency: Thin  Diet effective now                  Inpatient Medications  Scheduled Meds: . vitamin C  500 mg Oral Daily  . aspirin EC  81 mg Oral Daily  . cholecalciferol  1,000 Units Oral Daily  . dexamethasone (DECADRON) injection  6 mg Intravenous Q24H  . diltiazem  30 mg Oral Q6H  . donepezil  10 mg Oral QHS  . enoxaparin (LOVENOX) injection  80 mg Subcutaneous Q12H  . hydrochlorothiazide  25 mg Oral Daily  . memantine  10 mg Oral BID  . mirabegron ER  25 mg Oral Daily  . oxybutynin  10 mg Oral Daily  . rosuvastatin  20 mg Oral Daily  . sertraline  100 mg Oral Daily  . zinc sulfate  220 mg Oral Daily   Continuous Infusions: . remdesivir 100 mg in NS 100 mL 100 mg (12/24/19 0956)   PRN Meds:.acetaminophen, albuterol, guaiFENesin-dextromethorphan, metoprolol tartrate  Antibiotics  :    Anti-infectives (From admission, onward)   Start     Dose/Rate Route Frequency Ordered Stop   12/24/19 1000  remdesivir 100 mg in sodium chloride 0.9 % 100 mL IVPB       "Followed by" Linked Group Details   100 mg 200 mL/hr over 30 Minutes Intravenous Daily 12/23/19 0439 12/28/19 0959   12/23/19 0530  remdesivir 200 mg in sodium chloride 0.9% 250 mL IVPB       "Followed by" Linked Group Details   200 mg 580 mL/hr over 30 Minutes Intravenous Once 12/23/19 0439 12/23/19 0630        Beryle Zeitz M.D on 12/24/2019 at 3:39 PM  To page go to [email or web address]   [facility] -  Office  [phone]      Objective:   Vitals:   12/24/19 0623 12/24/19 0631 12/24/19 0738 12/24/19 1230  BP:  135/74 133/73 138/75  Pulse:   74 70  Resp:   (!) 23 (!) 22  Temp:   98.5 F (36.9 C) 98 F (36.7 C)  TempSrc:   Axillary Axillary  SpO2: 96% 97% 97% 90%  Weight:      Height:        Wt Readings from Last 3 Encounters:  12/23/19 83 kg  10/28/19 84.5 kg  04/23/19  78 kg     Intake/Output Summary (Last 24 hours) at 12/24/2019 1539 Last data  filed at 12/24/2019 0945 Gross per 24 hour  Intake 472.31 ml  Output 300 ml  Net 172.31 ml     Physical Exam  Awake Alert, No new F.N deficits, Normal affect Symmetrical Chest wall movement, Good air movement bilaterally, CTAB Irr Irr,No Gallops,Rubs or new Murmurs, No Parasternal Heave +ve B.Sounds, Abd Soft, No tenderness, No organomegaly appriciated, No rebound - guarding or rigidity. No Cyanosis, Clubbing or edema, No new Rash or bruise      Data Review:    CBC Recent Labs  Lab 12/23/19 0224 12/24/19 0244  WBC 13.0* 15.4*  HGB 11.5* 10.4*  HCT 35.9* 31.8*  PLT 474* 473*  MCV 93.7 91.9  MCH 30.0 30.1  MCHC 32.0 32.7  RDW 14.0 13.9  LYMPHSABS 2.1 2.1  MONOABS 1.0 1.2*  EOSABS 0.1 0.1  BASOSABS 0.0 0.0    Recent Labs  Lab 12/23/19 0224 12/23/19 0956 12/23/19 1047 12/23/19 1540 12/24/19 0244  NA 137  --   --  136 133*  K 2.7*  --   --  3.9 3.4*  CL 96*  --   --  98 98  CO2 28  --   --  27 24  GLUCOSE 175*  --   --  130* 128*  BUN 28*  --   --  23 24*  CREATININE 1.14*  --   --  1.00 0.98  CALCIUM 9.1  --   --  8.8* 8.8*  AST 62*  --   --   --  59*  ALT 48*  --   --   --  50*  ALKPHOS 57  --   --   --  56  BILITOT 0.8  --   --   --  0.8  ALBUMIN 2.4*  --   --   --  2.4*  MG  --  1.8 1.8  --  1.7  CRP  --  19.3*  --   --  15.6*  DDIMER 6.18*  --   --   --  6.15*  PROCALCITON <0.10  --   --   --   --   LATICACIDVEN 1.7  --  1.7  --   --     ------------------------------------------------------------------------------------------------------------------ Recent Labs    12/23/19 0956  TRIG 71    No results found for: HGBA1C ------------------------------------------------------------------------------------------------------------------ No results for input(s): TSH, T4TOTAL, T3FREE, THYROIDAB in the last 72 hours.  Invalid input(s): FREET3  Cardiac Enzymes No results for input(s): CKMB, TROPONINI, MYOGLOBIN in the last 168 hours.  Invalid  input(s): CK ------------------------------------------------------------------------------------------------------------------ No results found for: BNP  Micro Results Recent Results (from the past 240 hour(s))  Respiratory Panel by RT PCR (Flu A&B, Covid) - Nasopharyngeal Swab     Status: Abnormal   Collection Time: 12/16/19  6:34 PM   Specimen: Nasopharyngeal Swab  Result Value Ref Range Status   SARS Coronavirus 2 by RT PCR POSITIVE (A) NEGATIVE Final    Comment: RESULT CALLED TO, READ BACK BY AND VERIFIED WITH: Joline Maxcy RN 12/16/19 2047 JDW (NOTE) SARS-CoV-2 target nucleic acids are DETECTED.  SARS-CoV-2 RNA is generally detectable in upper respiratory specimens  during the acute phase of infection. Positive results are indicative of the presence of the identified virus, but do not rule out bacterial infection or co-infection with other pathogens not detected by the test. Clinical correlation with patient history and other  diagnostic information is necessary to determine patient infection status. The expected result is Negative.  Fact Sheet for Patients:  PinkCheek.be  Fact Sheet for Healthcare Providers: GravelBags.it  This test is not yet approved or cleared by the [place] FDA and  has been authorized for detection and/or diagnosis of SARS-CoV-2 by FDA under an Emergency Use Authorization (EUA).  This EUA will remain in effect (meaning this test can be  used) for the duration of  the COVID-19 declaration under Section 564(b)(1) of the Act, 21 U.S.C. section 360bbb-3(b)(1), unless the authorization is terminated or revoked sooner.      Influenza A by PCR NEGATIVE NEGATIVE Final   Influenza B by PCR NEGATIVE NEGATIVE Final    Comment: (NOTE) The Xpert Xpress SARS-CoV-2/FLU/RSV assay is intended as an aid in  the diagnosis of influenza from Nasopharyngeal swab specimens and  should not be used as a  sole basis for treatment. Nasal washings and  aspirates are unacceptable for Xpert Xpress SARS-CoV-2/FLU/RSV  testing.  Fact Sheet for Patients: PinkCheek.be  Fact Sheet for Healthcare Providers: GravelBags.it  This test is not yet approved or cleared by the [place] FDA and  has been authorized for detection and/or diagnosis of SARS-CoV-2 by  FDA under an Emergency Use Authorization (EUA). This EUA will remain  in effect (meaning this test can be used) for the duration of the  Covid-19 declaration under Section 564(b)(1) of the Act, 21  U.S.C. section 360bbb-3(b)(1), unless the authorization is  terminated or revoked. Performed at [facility], [place] [address] [place], [place] [address]   Blood Culture (routine x 2)     Status: None (Preliminary result)   Collection Time: 12/23/19  3:10 AM   Specimen: BLOOD  Result Value Ref Range Status   Specimen Description BLOOD RIGHT ARM  Final   Special Requests   Final    BOTTLES DRAWN AEROBIC AND ANAEROBIC Blood Culture adequate volume   Culture   Final    NO GROWTH 1 DAY Performed at [facility], [place] [address], [place], [place] [address]    Report Status PENDING  Incomplete  Blood Culture (routine x 2)     Status: None (Preliminary result)   Collection Time: 12/23/19 10:05 AM   Specimen: BLOOD  Result Value Ref Range Status   Specimen Description BLOOD RIGHT ANTECUBITAL  Final   Special Requests   Final    BOTTLES DRAWN AEROBIC AND ANAEROBIC Blood Culture adequate volume   Culture  Setup Time   Final    GRAM POSITIVE COCCI IN CLUSTERS AEROBIC BOTTLE ONLY CRITICAL RESULT CALLED TO, READ BACK BY AND VERIFIED WITH: PHARMD A Corning [id number] AT 737 AM BY CM Performed at [facility], [place] [address] [place], [place] [address]    Culture GRAM POSITIVE COCCI  Final   Report Status PENDING  Incomplete  Blood Culture ID Panel (Reflexed)     Status:  Abnormal   Collection Time: 12/23/19 10:05 AM  Result Value Ref Range Status   Enterococcus faecalis NOT DETECTED NOT DETECTED Final   Enterococcus Faecium NOT DETECTED NOT DETECTED Final   Listeria monocytogenes NOT DETECTED NOT DETECTED Final   Staphylococcus species DETECTED (A) NOT DETECTED Final    Comment: CRITICAL RESULT CALLED TO, READ BACK BY AND VERIFIED WITH: PHARMD A SULLIVAN [id number] AT 737 AM BY CM    Staphylococcus aureus (BCID) NOT DETECTED NOT DETECTED Final   Staphylococcus epidermidis NOT DETECTED NOT DETECTED Final  Staphylococcus lugdunensis NOT DETECTED NOT DETECTED Final   Streptococcus species NOT DETECTED NOT DETECTED Final   Streptococcus agalactiae NOT DETECTED NOT DETECTED Final   Streptococcus pneumoniae NOT DETECTED NOT DETECTED Final   Streptococcus pyogenes NOT DETECTED NOT DETECTED Final   A.calcoaceticus-baumannii NOT DETECTED NOT DETECTED Final   Bacteroides fragilis NOT DETECTED NOT DETECTED Final   Enterobacterales NOT DETECTED NOT DETECTED Final   Enterobacter cloacae complex NOT DETECTED NOT DETECTED Final   Escherichia coli NOT DETECTED NOT DETECTED Final   Klebsiella aerogenes NOT DETECTED NOT DETECTED Final   Klebsiella oxytoca NOT DETECTED NOT DETECTED Final   Klebsiella pneumoniae NOT DETECTED NOT DETECTED Final   Proteus species NOT DETECTED NOT DETECTED Final   Salmonella species NOT DETECTED NOT DETECTED Final   Serratia marcescens NOT DETECTED NOT DETECTED Final   Haemophilus influenzae NOT DETECTED NOT DETECTED Final   Neisseria meningitidis NOT DETECTED NOT DETECTED Final   Pseudomonas aeruginosa NOT DETECTED NOT DETECTED Final   Stenotrophomonas maltophilia NOT DETECTED NOT DETECTED Final   Candida albicans NOT DETECTED NOT DETECTED Final   Candida auris NOT DETECTED NOT DETECTED Final   Candida glabrata NOT DETECTED NOT DETECTED Final   Candida krusei NOT DETECTED NOT DETECTED Final   Candida parapsilosis NOT DETECTED NOT DETECTED  Final   Candida tropicalis NOT DETECTED NOT DETECTED Final   Cryptococcus neoformans/gattii NOT DETECTED NOT DETECTED Final    Comment: Performed at [facility], [address], [place], [place] [address]    Radiology Reports CT Head Wo Contrast  Result Date: 12/16/2019 CLINICAL DATA:  Headache COVID positive EXAM: CT HEAD WITHOUT CONTRAST TECHNIQUE: Contiguous axial images were obtained from the base of the skull through the vertex without intravenous contrast. COMPARISON:  None. FINDINGS: Brain: No evidence of acute territorial infarction, hemorrhage, hydrocephalus,extra-axial collection or mass lesion/mass effect. There is dilatation the ventricles and sulci consistent with age-related atrophy. Low-attenuation changes in the deep white matter consistent with small vessel ischemia. Vascular: No hyperdense vessel or unexpected calcification. Skull: The skull is intact. No fracture or focal lesion identified. Sinuses/Orbits: The visualized paranasal sinuses and mastoid air cells are clear. The orbits and globes intact. Other: None IMPRESSION: No acute intracranial abnormality. Findings consistent with age related atrophy and chronic small vessel ischemia Electronically Signed   By: Prudencio Pair M.D.   On: 12/16/2019 23:29   CT ANGIO CHEST PE W OR WO CONTRAST  Result Date: 12/23/2019 CLINICAL DATA:  Short of breath, COVID positive EXAM: CT ANGIOGRAPHY CHEST WITH CONTRAST TECHNIQUE: Multidetector CT imaging of the chest was performed using the standard protocol during bolus administration of intravenous contrast. Multiplanar CT image reconstructions and MIPs were obtained to evaluate the vascular anatomy. CONTRAST:  33m OMNIPAQUE IOHEXOL 350 MG/ML SOLN COMPARISON:  None. FINDINGS: Cardiovascular: Contrast bolus timing is optimal, but there is significant respiratory motion artifact. Evaluation of the pulmonary arteries is poor beyond proximal segmental level. There is no acute pulmonary embolism  identified within this limitation. Enlargement of the main pulmonary artery suggesting pulmonary arterial hypertension. Cardiomegaly with moderate pericardial effusion. Reflux of contrast into the hepatic veins suggests right heart dysfunction. Mediastinum/Nodes: No enlarged lymph nodes identified. Lungs/Pleura: Suboptimal evaluation due to respiratory motion and imaging during expiration. Areas of subsegmental atelectasis. Patchy opacities, which may represent additional atelectasis, edema, or pneumonia. Bronchiectasis at the lung bases. Small bilateral pleural effusions. Pleural fluid extends into the right oblique fissure. Upper Abdomen: No acute abnormality. Musculoskeletal: No acute osseous abnormality. Review of  the MIP images confirms the above findings. IMPRESSION: Suboptimal evaluation due to respiratory motion. No acute pulmonary embolism identified to the proximal segmental level. Cardiomegaly with moderate pericardial effusion. Evidence of pulmonary arterial hypertension. Scattered patchy pulmonary opacities, which may reflect atelectasis, edema and/or pneumonia. Given above and small bilateral pleural effusions, edema is favored. Electronically Signed   By: Macy Mis M.D.   On: 12/23/2019 08:16   DG Chest Portable 1 View  Result Date: 12/21/2019 CLINICAL DATA:  Shortness of breath.  COVID for a week. EXAM: PORTABLE CHEST 1 VIEW COMPARISON:  12/16/2019. FINDINGS: Enlarged cardiac silhouette. Aortic atherosclerosis. No visible pneumothorax. New bibasilar airspace opacities. Limited evaluation for pleural effusions given bibasilar opacities and AP portable technique. No acute osseous abnormality. IMPRESSION: 1. New bibasilar airspace opacities, concerning for multifocal pneumonia. 2. Cardiomegaly. Electronically Signed   By: Margaretha Sheffield MD   On: 12/21/2019 13:18   DG Chest Port 1 View  Result Date: 12/16/2019 CLINICAL DATA:  86 year old female with positive COVID test. EXAM: PORTABLE  CHEST 1 VIEW COMPARISON:  Chest radiograph dated 08/26/2018. FINDINGS: There is cardiomegaly with mild vascular congestion. No focal consolidation, pleural effusion, or pneumothorax. Minimal bibasilar densities, likely atelectasis. Atherosclerotic calcification of the aorta. No acute osseous pathology. IMPRESSION: Cardiomegaly with mild vascular congestion. No focal consolidation. Electronically Signed   By: Anner Crete M.D.   On: 12/16/2019 21:33   VAS [place] LOWER EXTREMITY VENOUS (DVT)  Result Date: 12/24/2019  Lower Venous DVT Study Indications: Elevated ddimer.  Comparison Study: no prior Performing Technologist: Abram Sander RVS  Examination Guidelines: A complete evaluation includes B-mode imaging, spectral Doppler, color Doppler, and power Doppler as needed of all accessible portions of each vessel. Bilateral testing is considered an integral part of a complete examination. Limited examinations for reoccurring indications may be performed as noted. The reflux portion of the exam is performed with the patient in reverse Trendelenburg.  +---------+---------------+---------+-----------+----------+--------------+ RIGHT    CompressibilityPhasicitySpontaneityPropertiesThrombus Aging +---------+---------------+---------+-----------+----------+--------------+ CFV      Full           Yes      Yes                                 +---------+---------------+---------+-----------+----------+--------------+ SFJ      Full                                                        +---------+---------------+---------+-----------+----------+--------------+ FV Prox  Full                                                        +---------+---------------+---------+-----------+----------+--------------+ FV Mid   Full                                                        +---------+---------------+---------+-----------+----------+--------------+ FV DistalFull                                                         +---------+---------------+---------+-----------+----------+--------------+  PFV      Full                                                        +---------+---------------+---------+-----------+----------+--------------+ POP      Full           Yes      Yes                                 +---------+---------------+---------+-----------+----------+--------------+ PTV      Full                                                        +---------+---------------+---------+-----------+----------+--------------+ PERO     Full                                                        +---------+---------------+---------+-----------+----------+--------------+   +---------+---------------+---------+-----------+----------+-------------------+ LEFT     CompressibilityPhasicitySpontaneityPropertiesThrombus Aging      +---------+---------------+---------+-----------+----------+-------------------+ CFV      Full           Yes      Yes                                      +---------+---------------+---------+-----------+----------+-------------------+ SFJ      Full                                                             +---------+---------------+---------+-----------+----------+-------------------+ FV Prox  Full                                                             +---------+---------------+---------+-----------+----------+-------------------+ FV Mid                  Yes      Yes                                      +---------+---------------+---------+-----------+----------+-------------------+ FV Distal               Yes      Yes                                      +---------+---------------+---------+-----------+----------+-------------------+ PFV      Full                                                             +---------+---------------+---------+-----------+----------+-------------------+  POP      Full            Yes      Yes                                      +---------+---------------+---------+-----------+----------+-------------------+ PTV      Full                                                             +---------+---------------+---------+-----------+----------+-------------------+ PERO                                                  Not well visualized +---------+---------------+---------+-----------+----------+-------------------+     Summary: BILATERAL: - No evidence of deep vein thrombosis seen in the lower extremities, bilaterally. - No evidence of superficial venous thrombosis in the lower extremities, bilaterally. -No evidence of popliteal cyst, bilaterally.   *See table(s) above for measurements and observations.    Preliminary

## 2019-12-24 NOTE — Progress Notes (Signed)
Lower extremity venous has been completed.   Preliminary results in CV Proc.   Abram Sander 12/24/2019 3:23 PM

## 2019-12-24 NOTE — Progress Notes (Signed)
Report received by this RN. Patient resting comfortably with no needs at this time. °

## 2019-12-24 NOTE — Progress Notes (Signed)
PHARMACY - PHYSICIAN COMMUNICATION CRITICAL VALUE ALERT - BLOOD CULTURE IDENTIFICATION (BCID)  [name] [name] is an 85 y.o. female who presented to [facility] on 12/22/2019 with a chief complaint of COVID19 infection  Assessment:  86 year old female presenting with COVID19 infection with blood cultures positive for staph species, suspected contaminant   Name of physician (or Provider) Contacted: D. Elgergawy  Current antibiotics: none   Changes to prescribed antibiotics recommended:  No antibiotics recommended    Results for orders placed or performed during the hospital encounter of 12/22/19  Blood Culture ID Panel (Reflexed) (Collected: 12/23/2019 10:05 AM)  Result Value Ref Range   Enterococcus faecalis NOT DETECTED NOT DETECTED   Enterococcus Faecium NOT DETECTED NOT DETECTED   Listeria monocytogenes NOT DETECTED NOT DETECTED   Staphylococcus species DETECTED (A) NOT DETECTED   Staphylococcus aureus (BCID) NOT DETECTED NOT DETECTED   Staphylococcus epidermidis NOT DETECTED NOT DETECTED   Staphylococcus lugdunensis NOT DETECTED NOT DETECTED   Streptococcus species NOT DETECTED NOT DETECTED   Streptococcus agalactiae NOT DETECTED NOT DETECTED   Streptococcus pneumoniae NOT DETECTED NOT DETECTED   Streptococcus pyogenes NOT DETECTED NOT DETECTED   A.calcoaceticus-baumannii NOT DETECTED NOT DETECTED   Bacteroides fragilis NOT DETECTED NOT DETECTED   Enterobacterales NOT DETECTED NOT DETECTED   Enterobacter cloacae complex NOT DETECTED NOT DETECTED   Escherichia coli NOT DETECTED NOT DETECTED   Klebsiella aerogenes NOT DETECTED NOT DETECTED   Klebsiella oxytoca NOT DETECTED NOT DETECTED   Klebsiella pneumoniae NOT DETECTED NOT DETECTED   Proteus species NOT DETECTED NOT DETECTED   Salmonella species NOT DETECTED NOT DETECTED   Serratia marcescens NOT DETECTED NOT DETECTED   Haemophilus influenzae NOT DETECTED NOT DETECTED   Neisseria meningitidis NOT DETECTED NOT DETECTED    Pseudomonas aeruginosa NOT DETECTED NOT DETECTED   Stenotrophomonas maltophilia NOT DETECTED NOT DETECTED   Candida albicans NOT DETECTED NOT DETECTED   Candida auris NOT DETECTED NOT DETECTED   Candida glabrata NOT DETECTED NOT DETECTED   Candida krusei NOT DETECTED NOT DETECTED   Candida parapsilosis NOT DETECTED NOT DETECTED   Candida tropicalis NOT DETECTED NOT DETECTED   Cryptococcus neoformans/gattii NOT DETECTED NOT [place]  PGY1 Pharmacy Resident 12/24/2019  8:22 AM

## 2019-12-25 LAB — CULTURE, BLOOD (ROUTINE X 2): Special Requests: ADEQUATE

## 2019-12-25 LAB — CBC WITH DIFFERENTIAL/PLATELET
Abs Immature Granulocytes: 0.2 10*3/uL — ABNORMAL HIGH (ref 0.00–0.07)
Basophils Absolute: 0 10*3/uL (ref 0.0–0.1)
Basophils Relative: 0 %
Eosinophils Absolute: 0 10*3/uL (ref 0.0–0.5)
Eosinophils Relative: 0 %
HCT: 33.9 % — ABNORMAL LOW (ref 36.0–46.0)
Hemoglobin: 11.1 g/dL — ABNORMAL LOW (ref 12.0–15.0)
Immature Granulocytes: 1 %
Lymphocytes Relative: 8 %
Lymphs Abs: 1.2 10*3/uL (ref 0.7–4.0)
MCH: 29.8 pg (ref 26.0–34.0)
MCHC: 32.7 g/dL (ref 30.0–36.0)
MCV: 91.1 fL (ref 80.0–100.0)
Monocytes Absolute: 1.3 10*3/uL — ABNORMAL HIGH (ref 0.1–1.0)
Monocytes Relative: 8 %
Neutro Abs: 12.7 10*3/uL — ABNORMAL HIGH (ref 1.7–7.7)
Neutrophils Relative %: 83 %
Platelets: 531 10*3/uL — ABNORMAL HIGH (ref 150–400)
RBC: 3.72 MIL/uL — ABNORMAL LOW (ref 3.87–5.11)
RDW: 13.8 % (ref 11.5–15.5)
WBC: 15.4 10*3/uL — ABNORMAL HIGH (ref 4.0–10.5)
nRBC: 0 % (ref 0.0–0.2)

## 2019-12-25 LAB — COMPREHENSIVE METABOLIC PANEL
ALT: 44 U/L (ref 0–44)
AST: 44 U/L — ABNORMAL HIGH (ref 15–41)
Albumin: 2.4 g/dL — ABNORMAL LOW (ref 3.5–5.0)
Alkaline Phosphatase: 55 U/L (ref 38–126)
Anion gap: 10 (ref 5–15)
BUN: 22 mg/dL (ref 8–23)
CO2: 27 mmol/L (ref 22–32)
Calcium: 9.1 mg/dL (ref 8.9–10.3)
Chloride: 95 mmol/L — ABNORMAL LOW (ref 98–111)
Creatinine, Ser: 0.91 mg/dL (ref 0.44–1.00)
GFR, Estimated: >60 mL/min (ref >60)
Glucose, Bld: 125 mg/dL — ABNORMAL HIGH (ref 70–99)
Potassium: 4 mmol/L (ref 3.5–5.1)
Sodium: 132 mmol/L — ABNORMAL LOW (ref 135–145)
Total Bilirubin: 0.8 mg/dL (ref 0.3–1.2)
Total Protein: 7 g/dL (ref 6.5–8.1)

## 2019-12-25 LAB — C-REACTIVE PROTEIN: CRP: 15.3 mg/dL — ABNORMAL HIGH (ref <1.0)

## 2019-12-25 LAB — D-DIMER, QUANTITATIVE: D-Dimer, Quant: 5.13 ug/mL-FEU — ABNORMAL HIGH (ref 0.00–0.50)

## 2019-12-25 MED ORDER — DILTIAZEM HCL ER COATED BEADS 120 MG PO CP24
120.0000 mg | ORAL_CAPSULE | Freq: Every day | ORAL | Status: DC
  Administered 2019-12-25 – 2019-12-26 (×2): 120 mg via ORAL
  Filled 2019-12-25 (×2): qty 1

## 2019-12-25 MED ORDER — ENOXAPARIN SODIUM 40 MG/0.4ML ~~LOC~~ SOLN
40.0000 mg | Freq: Two times a day (BID) | SUBCUTANEOUS | Status: DC
  Administered 2019-12-25 – 2019-12-29 (×8): 40 mg via SUBCUTANEOUS
  Filled 2019-12-25 (×8): qty 0.4

## 2019-12-25 MED ORDER — PANTOPRAZOLE SODIUM 40 MG PO TBEC
40.0000 mg | DELAYED_RELEASE_TABLET | Freq: Every day | ORAL | Status: DC
  Administered 2019-12-25 – 2019-12-29 (×5): 40 mg via ORAL
  Filled 2019-12-25 (×5): qty 1

## 2019-12-25 NOTE — TOC Initial Note (Addendum)
Transition of Care [facility]) - Initial/Assessment Note    Patient Details  Name: [name] [name] MRN: [id number] Date of Birth: 06/22/1928  Transition of Care [facility]) CM/SW Contact:    Verdell Carmine, RN Phone Number: 12/25/2019, 10:37 AM  Clinical Narrative:                 Patient admitted for COVID pneumonia, on room air currently, receiving IV treatments. Patient lives in [place] living [place] , has DME at home. Has had [facility] in the past signed off last February 2021.  CM will be following for any needs.  76 reached out to daughter Levander Campion, left message  To return my call to discuss discharge planning. 30 day and 10 day eliquis card sent to unit  Expected Discharge Plan: Home/Self Care Barriers to Discharge: Continued Medical Work up   Patient Goals and CMS Choice        Expected Discharge Plan and Services Expected Discharge Plan: Home/Self Care   Discharge Planning Services: CM Consult   Living arrangements for the past 2 months: Single Family Home                                      Prior Living Arrangements/Services Living arrangements for the past 2 months: Single Family Home Lives with:: Adult Children Patient language and need for interpreter reviewed:: Yes Do you feel safe going back to the place where you live?: Yes      Need for Family Participation in Patient Care: Yes (Comment) Care giver support system in place?: Yes (comment)   Criminal Activity/Legal Involvement Pertinent to Current Situation/Hospitalization: No - Comment as needed  Activities of Daily Living      Permission Sought/Granted                  Emotional Assessment         Alcohol / Substance Use: Not Applicable Psych Involvement: No (comment)  Admission diagnosis:  Acute on chronic respiratory failure with hypoxia ([place]) [J96.21] Pneumonia due to COVID-19 virus [U07.1, J12.82] Patient Active Problem List   Diagnosis Date Noted  . Pneumonia due to COVID-19  virus 12/23/2019  . Acute hypoxemic respiratory failure ([place]) 12/23/2019  . Hypokalemia 12/23/2019  . Transaminitis 12/23/2019  . Slow heart rate 03/13/2019  . Mild neurocognitive disorder 02/26/2019  . Clavi 01/20/2019  . Porokeratosis 09/02/2018  . Corns and callosities 09/02/2018  . Elevated CK   . Pressure injury of skin 08/27/2018  . Atrial fibrillation ([place]) 08/27/2018  . Hypernatremia   . Rhabdomyolysis 08/26/2018  . Normocytic anemia 08/26/2018  . Renal insufficiency 08/26/2018  . Community acquired pneumonia 01/13/2013  . Sepsis ([place]) 01/13/2013  . Hyponatremia 01/13/2013  . Hypochloremia 01/13/2013  . Rheumatoid arthritis ([place]) 01/13/2013  . Premature atrial contractions 07/22/2012  . Hyperlipidemia 07/22/2012  . Carotid artery bruit 07/22/2012  . Aortic stenosis 07/22/2012  . Chest pain   . HTN (hypertension)    PCP:  Merrilee Seashore, MD Pharmacy:   [facility] [place] ([place]), [place] - [address] [place] ([place]) [place] [address] Phone: [phone] Fax: [phone]     Social Determinants of Health (SDOH) Interventions    Readmission Risk Interventions No flowsheet data found.

## 2019-12-25 NOTE — Evaluation (Signed)
Occupational Therapy Evaluation Patient Details Name: [name] [name] MRN: [id number] DOB: 12/24/1928 Today's Date: 12/25/2019    History of Present Illness 86 yo female presenting to ED per PCP on 12/21/19 with cough and hypoxia. Pt tested positive for COVID-19 during ED visit on 12/16/19 and dicharged. Chest x-ray revealed new bibasilar airspace opacities concerning for multifocal pneumonia. PMH including paroxysmal A. fib not on anticoagulation, aortic stenosis, hypertension, hyperlipidemia, rheumatoid arthritis, dementia, and depression.   Clinical Impression   PTA, pt was living alone and performed BADLs and light IADLs; uses rollator for mobility. Pt currently requiring Min guard A for UB ADLs, Min A for LB ADLs, and Min Guard A for functional mobility. Pt presenting with decreased activity tolerance as seen by SOB, fatigue, and decreased SpO2. SpO2 pleth line poor (placing new monitor on both finger and earlobe) and reading ~85%. Talked with daughter on phone and she reports she has tested negative for COVID twice and is in poor health. Reports she doesn't feel she can provide support needed for pt to dc to home and is agreeable to SNF. Pt would benefit from further acute OT to facilitate safe dc. Recommend dc to SNF for further OT to optimize safety, independence with ADLs, and return to PLOF.     Follow Up Recommendations  SNF    Equipment Recommendations  None recommended by OT    Recommendations for Other Services PT consult     Precautions / Restrictions Precautions Precautions: Fall Restrictions Weight Bearing Restrictions: No      Mobility Bed Mobility Overal bed mobility: Needs Assistance Bed Mobility: Supine to Sit;Sit to Supine     Supine to sit: Supervision Sit to supine: Supervision        Transfers Overall transfer level: Needs assistance Equipment used: Rolling walker (2 wheeled) Transfers: Sit to/from Stand Sit to Stand: Min guard         General  transfer comment: Min guard A for safety    Balance Overall balance assessment: Needs assistance Sitting-balance support: No upper extremity supported;Feet supported Sitting balance-Leahy Scale: Fair     Standing balance support: During functional activity;No upper extremity supported Standing balance-Leahy Scale: Poor Standing balance comment: Reliant on UE support                           ADL either performed or assessed with clinical judgement   ADL Overall ADL's : Needs assistance/impaired Eating/Feeding: Set up;Sitting   Grooming: Wash/dry hands;Supervision/safety;Standing   Upper Body Bathing: Min guard;Sitting   Lower Body Bathing: Minimal assistance;Sit to/from stand   Upper Body Dressing : Min guard;Sitting   Lower Body Dressing: Minimal assistance;Sit to/from stand   Toilet Transfer: Min guard;Ambulation;Regular Toilet;Grab bars;RW   Toileting- Water quality scientist and Hygiene: Min guard;Sitting/lateral lean       Functional mobility during ADLs: Surveyor, minerals     Praxis      Pertinent Vitals/Pain       Hand Dominance Right   Extremity/Trunk Assessment Upper Extremity Assessment Upper Extremity Assessment: Generalized weakness   Lower Extremity Assessment Lower Extremity Assessment: Defer to PT evaluation   Cervical / Trunk Assessment Cervical / Trunk Assessment: Kyphotic   Communication Communication Communication: No difficulties   Cognition Arousal/Alertness: Awake/alert Behavior During Therapy: WFL for tasks assessed/performed Overall Cognitive Status: History of cognitive impairments - at baseline  General Comments  SpO2 90s on RA and HR 90s. Poor pleth during mobility and SpO2 84%. HR max 153.    Exercises     Shoulder Instructions      Home Living Family/patient expects to be discharged to:: Private residence Living  Arrangements: Alone   Type of Home: Independent living facility Home Access: Level entry     Home Layout: One level     Bathroom Shower/Tub: Teacher, early years/pre: Standard (BSC)     Home Equipment: Tub bench;Cane - single point;Walker - 2 wheels;Grab bars - tub/shower;Bedside commode;Walker - 4 wheels   Additional Comments: Has a life alert      Prior Functioning/Environment Level of Independence: Independent with assistive device(s)        Comments: ADLs and light IADLs. Daughter assists with IADLs and visits a lot. Drives very short distances. Goes to a lunch program for some meals        OT Problem List: Decreased strength;Decreased range of motion;Decreased activity tolerance;Impaired balance (sitting and/or standing);Decreased knowledge of use of DME or AE;Cardiopulmonary status limiting activity;Decreased knowledge of precautions;Decreased safety awareness      OT Treatment/Interventions: Self-care/ADL training;Therapeutic exercise;Energy conservation;DME and/or AE instruction;Therapeutic activities;Patient/family education    OT Goals(Current goals can be found in the care plan section) Acute Rehab OT Goals Patient Stated Goal: Go home OT Goal Formulation: With patient Time For Goal Achievement: 01/08/20 Potential to Achieve Goals: Good  OT Frequency: Min 2X/week   Barriers to D/C:            Co-evaluation              AM-PAC OT "6 Clicks" Daily Activity     Outcome Measure Help from another person eating meals?: A Little Help from another person taking care of personal grooming?: A Little Help from another person toileting, which includes using toliet, bedpan, or urinal?: A Little Help from another person bathing (including washing, rinsing, drying)?: A Little Help from another person to put on and taking off regular upper body clothing?: A Little Help from another person to put on and taking off regular lower body clothing?: A  Little 6 Click Score: 18   End of Session Equipment Utilized During Treatment: Rolling walker Nurse Communication: Mobility status  Activity Tolerance: Patient tolerated treatment well Patient left: in bed;with call bell/phone within reach;with bed alarm set  OT Visit Diagnosis: Unsteadiness on feet (R26.81);Other abnormalities of gait and mobility (R26.89);Muscle weakness (generalized) (M62.81);History of falling (Z91.81);Repeated falls (R29.6)                Time: 4580-9983 OT Time Calculation (min): 32 min Charges:  OT General Charges $OT Visit: 1 Visit OT Evaluation $OT Eval Moderate Complexity: 1 Mod OT Treatments $Self Care/Home Management : 8-22 mins  Kemberly Taves MSOT, OTR/L Acute Rehab Pager: [phone] Office: [place] 12/25/2019, 4:51 PM

## 2019-12-25 NOTE — Progress Notes (Addendum)
PROGRESS NOTE                                                                             PROGRESS NOTE                                                                                                                                                                                                             Patient Demographics:    [name] [name], is a 86 y.o. female, DOB - [birth year], XQJ:[id number]  Outpatient Primary MD for the patient is Merrilee Seashore, MD    LOS - 2  Admit date - 12/22/2019    Chief Complaint  Patient presents with  . Cough       Brief Narrative    HPI: [name] [name] is a 86 y.o. female with medical history significant of paroxysmal A. fib not on anticoagulation, aortic stenosis, hypertension, hyperlipidemia, rheumatoid arthritis, dementia, depression presenting the ED with with a chief complaint of cough.  Patient recently tested positive for COVID-19 during ED visit on 12/16/2019.  She did not have any hypoxemia at that time and was discharged.  Subsequently received outpatient bamlanivimab/etesevimab infusion on 12/18/2019.  Patient was then sent to the ED by her PCP on 12/21/2019 for evaluation of low oxygen saturation of 71% on room air.  She was satting 97% on room air at triage with no signs of respiratory distress.  Chest x-ray revealed new bibasilar airspace opacities concerning for multifocal pneumonia.  Patient left the ED before being evaluated by a physician.    Patient states she was sent to the ED by her doctor who told her she had pneumonia.  She has no complaints other than a cough.  Denies fevers, chills, body aches, fatigue, shortness of breath, chest pain, nausea, vomiting, abdominal pain, diarrhea, or dysuria.  No additional history could be obtained from her.  ED Course: Afebrile.  Not hypoxic at rest but sats dropped to 70s on room air with ambulation.  WBC 13.0, hemoglobin 11.5 (at baseline), platelet 474k.   Sodium  137, potassium 2.7, chloride 96, bicarb 28, BUN 28, creatinine 1.1, glucose 175.  AST 62, ALT 48.  Alk phos and T bili normal.  Lactic acid level normal.  Procalcitonin <0.10.  Inflammatory markers elevated: D-dimer 6.18, LDH 279, fibrinogen 798.  Ferritin and CRP levels pending.  Blood culture x2 pending.  Patient received IV Decadron 6 mg and remdesivir.     Subjective:    [name] today denies any headache, shortness of breath or chest pain.   Assessment  & Plan :    Principal Problem:   Pneumonia due to COVID-19 virus Active Problems:   Atrial fibrillation ([name])   Acute hypoxemic respiratory failure (HCC)   Hypokalemia   Transaminitis   COVID-19 viral pneumonia  -She is vaccinated with Mederma in April . -imaging significant for bilateral opacities. -Continue with steroids and Remdesivir. -D Dimer is elevated, CTA chest with no evidence of PE, will check venous Dopplers, meanwhile keep on full dose anticoagulation and continue to trend D-dimers. -Continue to trend inflammatory markers, CRP and D-dimers trending down, but they are significantly elevated, for which we will continue with Decadron for now.  -She was encouraged to use incentive spirometry, flutter valve, out of bed to chair. -Received outpatient bamlanivimab/etesevimab infusion on 12/18/2019 -He is with elevated D-dimers, CTA with no PE, venous Dopplers with no DVT, will keep on full dose Lovenox till D-dimer less than 2.  COVID-19 Labs  Recent Labs    12/23/19 0224 12/23/19 0956 12/24/19 0244 12/25/19 0036  DDIMER 6.18*  --  6.15* 5.13*  FERRITIN  --  541*  --   --   LDH 279*  --   --   --   CRP  --  19.3* 15.6* 15.3*    Lab Results  Component Value Date   SARSCOV2NAA POSITIVE (A) 12/16/2019   [place] NEGATIVE 08/26/2018     Hypokalemia -Repleted,   1/4 blood culture with staph hominis, -Most likely contamination, no indication to treat, procalcitonin within normal limit.  Mild  transaminitis -Late due to Covid infection, trending down.  Paroxysmal A. Fib CHA2DS2VASc at least 4.  Not on chronic anticoagulation as she only had one single episode during hospital stay in 2020 with no recurrence . -Heart rate uncontrolled on admission, requiring Cardizem drip, she is transition to p.o. Cardizem with good control on cardizem 30 mg every 6 hours, has been consolidated to Cardizem CD at 120 mg p.o. daily.   -Keep K >4, and magnesium > 2.. -I have discussed with cardiology Dr. Burt Knack, patient is extremely high risk for falls, so frequent over the last year, with one fall where she remained on the floor for few days, risks outweighed benefits, no full dose anticoagulation, continue with baby aspirin.  Hypertension -Resume home meds  Hyperlipidemia -Resume statin   Dementia -Resume home meds   Depression -Resume home meds  SpO2: 96 %  Recent Labs  Lab 12/23/19 0224 12/23/19 0956 12/23/19 1047 12/24/19 0244 12/25/19 0036  WBC 13.0*  --   --  15.4* 15.4*  PLT 474*  --   --  473* 531*  CRP  --  19.3*  --  15.6* 15.3*  DDIMER 6.18*  --   --  6.15* 5.13*  PROCALCITON <0.10  --   --   --   --   AST 62*  --   --  59* 44*  ALT 48*  --   --  50* 44  ALKPHOS 57  --   --  56 55  BILITOT 0.8  --   --  0.8 0.8  ALBUMIN 2.4*  --   --  2.4* 2.4*  LATICACIDVEN 1.7  --  1.7  --   --        ABG  No results found for: PHART, PCO2ART, PO2ART, HCO3, TCO2, ACIDBASEDEF, O2SAT         Condition - Extremely Guarded  Family communication: D/W daughter dianne 11/4  Code Status :  Full  Consults  :  none  Procedures  :  none  PUD Prophylaxis : protonix  Disposition Plan  :    Status is: Inpatient  Remains inpatient appropriate because:Persistent severe electrolyte disturbances and IV treatments appropriate due to intensity of illness or inability to take PO   Dispo: The patient is from: Home              Anticipated d/c is to: Home               Anticipated d/c date is: 1 day              Patient currently is not medically stable to d/c.      DVT Prophylaxis  : Full dose Lovenox  Lab Results  Component Value Date   PLT 531 (H) 12/25/2019    Diet :  Diet Order            Diet Heart Room service appropriate? Yes; Fluid consistency: Thin  Diet effective now                  Inpatient Medications  Scheduled Meds: . vitamin C  500 mg Oral Daily  . aspirin EC  81 mg Oral Daily  . cholecalciferol  1,000 Units Oral Daily  . dexamethasone (DECADRON) injection  6 mg Intravenous Q24H  . diltiazem  120 mg Oral Daily  . donepezil  10 mg Oral QHS  . enoxaparin (LOVENOX) injection  80 mg Subcutaneous Q12H  . hydrochlorothiazide  25 mg Oral Daily  . memantine  10 mg Oral BID  . mirabegron ER  25 mg Oral Daily  . oxybutynin  10 mg Oral Daily  . rosuvastatin  20 mg Oral Daily  . sertraline  100 mg Oral Daily  . zinc sulfate  220 mg Oral Daily   Continuous Infusions: . remdesivir 100 mg in NS 100 mL 100 mg (12/25/19 0953)   PRN Meds:.acetaminophen, albuterol, guaiFENesin-dextromethorphan, metoprolol tartrate  Antibiotics  :    Anti-infectives (From admission, onward)   Start     Dose/Rate Route Frequency Ordered Stop   12/24/19 1000  remdesivir 100 mg in sodium chloride 0.9 % 100 mL IVPB       "Followed by" Linked Group Details   100 mg 200 mL/hr over 30 Minutes Intravenous Daily 12/23/19 0439 12/28/19 0959   12/23/19 0530  remdesivir 200 mg in sodium chloride 0.9% 250 mL IVPB       "Followed by" Linked Group Details   200 mg 580 mL/hr over 30 Minutes Intravenous Once 12/23/19 0439 12/23/19 0630        Taniyah Ballow M.D on 12/25/2019 at 3:46 PM  To page go to [email or web address]   [facility] -  Office  [phone]      Objective:   Vitals:   12/24/19 2011 12/25/19 0421 12/25/19 0700 12/25/19 1500  BP: (!) 142/88 (!) 157/86  (!) 156/91  Pulse: 72 88  87  Resp: (!) 21 18  20   Temp: 97.7  F  (36.5 C) 97.7 F (36.5 C)  98.6 F (37 C)  TempSrc: Oral Oral  Oral  SpO2: 99% 97% 95% 96%  Weight:      Height:        Wt Readings from Last 3 Encounters:  12/23/19 83 kg  10/28/19 84.5 kg  04/23/19 78 kg     Intake/Output Summary (Last 24 hours) at 12/25/2019 1546 Last data filed at 12/25/2019 0953 Gross per 24 hour  Intake 340 ml  Output 250 ml  Net 90 ml     Physical Exam  Awake Alert, Oriented X 3, No new F.N deficits, Normal affect Symmetrical Chest wall movement, Good air movement bilaterally, CTAB Irregular irregular,No Gallops,Rubs or new Murmurs, No Parasternal Heave +ve B.Sounds, Abd Soft, No tenderness, No rebound - guarding or rigidity. No Cyanosis, Clubbing or edema, No new Rash or bruise       Data Review:    CBC Recent Labs  Lab 12/23/19 0224 12/24/19 0244 12/25/19 0036  WBC 13.0* 15.4* 15.4*  HGB 11.5* 10.4* 11.1*  HCT 35.9* 31.8* 33.9*  PLT 474* 473* 531*  MCV 93.7 91.9 91.1  MCH 30.0 30.1 29.8  MCHC 32.0 32.7 32.7  RDW 14.0 13.9 13.8  LYMPHSABS 2.1 2.1 1.2  MONOABS 1.0 1.2* 1.3*  EOSABS 0.1 0.1 0.0  BASOSABS 0.0 0.0 0.0    Recent Labs  Lab 12/23/19 0224 12/23/19 0956 12/23/19 1047 12/23/19 1540 12/24/19 0244 12/25/19 0036  NA 137  --   --  136 133* 132*  K 2.7*  --   --  3.9 3.4* 4.0  CL 96*  --   --  98 98 95*  CO2 28  --   --  27 24 27   GLUCOSE 175*  --   --  130* 128* 125*  BUN 28*  --   --  23 24* 22  CREATININE 1.14*  --   --  1.00 0.98 0.91  CALCIUM 9.1  --   --  8.8* 8.8* 9.1  AST 62*  --   --   --  59* 44*  ALT 48*  --   --   --  50* 44  ALKPHOS 57  --   --   --  56 55  BILITOT 0.8  --   --   --  0.8 0.8  ALBUMIN 2.4*  --   --   --  2.4* 2.4*  MG  --  1.8 1.8  --  1.7  --   CRP  --  19.3*  --   --  15.6* 15.3*  DDIMER 6.18*  --   --   --  6.15* 5.13*  PROCALCITON <0.10  --   --   --   --   --   LATICACIDVEN 1.7  --  1.7  --   --   --      ------------------------------------------------------------------------------------------------------------------ Recent Labs    12/23/19 0956  TRIG 71    No results found for: HGBA1C ------------------------------------------------------------------------------------------------------------------ No results for input(s): TSH, T4TOTAL, T3FREE, THYROIDAB in the last 72 hours.  Invalid input(s): FREET3  Cardiac Enzymes No results for input(s): CKMB, TROPONINI, MYOGLOBIN in the last 168 hours.  Invalid input(s): CK ------------------------------------------------------------------------------------------------------------------ No results found for: BNP  Micro Results Recent Results (from the past 240 hour(s))  Respiratory Panel by RT PCR (Flu A&B, Covid) - Nasopharyngeal Swab     Status: Abnormal   Collection Time: 12/16/19  6:34 PM   Specimen: Nasopharyngeal Swab  Result Value Ref Range Status   SARS Coronavirus 2 by RT PCR POSITIVE (A) NEGATIVE Final    Comment: RESULT CALLED TO, READ BACK BY AND VERIFIED WITH: Joline Maxcy RN 12/16/19 2047 JDW (NOTE) SARS-CoV-2 target nucleic acids are DETECTED.  SARS-CoV-2 RNA is generally detectable in upper respiratory specimens  during the acute phase of infection. Positive results are indicative of the presence of the identified virus, but do not rule out bacterial infection or co-infection with other pathogens not detected by the test. Clinical correlation with patient history and other diagnostic information is necessary to determine patient infection status. The expected result is Negative.  Fact Sheet for Patients:  PinkCheek.be  Fact Sheet for Healthcare Providers: GravelBags.it  This test is not yet approved or cleared by the [place] FDA and  has been authorized for detection and/or diagnosis of SARS-CoV-2 by FDA under an Emergency Use Authorization  (EUA).  This EUA will remain in effect (meaning this test can be  used) for the duration of  the COVID-19 declaration under Section 564(b)(1) of the Act, 21 U.S.C. section 360bbb-3(b)(1), unless the authorization is terminated or revoked sooner.      Influenza A by PCR NEGATIVE NEGATIVE Final   Influenza B by PCR NEGATIVE NEGATIVE Final    Comment: (NOTE) The Xpert Xpress SARS-CoV-2/FLU/RSV assay is intended as an aid in  the diagnosis of influenza from Nasopharyngeal swab specimens and  should not be used as a sole basis for treatment. Nasal washings and  aspirates are unacceptable for Xpert Xpress SARS-CoV-2/FLU/RSV  testing.  Fact Sheet for Patients: PinkCheek.be  Fact Sheet for Healthcare Providers: GravelBags.it  This test is not yet approved or cleared by the [place] FDA and  has been authorized for detection and/or diagnosis of SARS-CoV-2 by  FDA under an Emergency Use Authorization (EUA). This EUA will remain  in effect (meaning this test can be used) for the duration of the  Covid-19 declaration under Section 564(b)(1) of the Act, 21  U.S.C. section 360bbb-3(b)(1), unless the authorization is  terminated or revoked. Performed at [facility], [place] [address] [place], [place] [address]   Blood Culture (routine x 2)     Status: None (Preliminary result)   Collection Time: 12/23/19  3:10 AM   Specimen: BLOOD  Result Value Ref Range Status   Specimen Description BLOOD RIGHT ARM  Final   Special Requests   Final    BOTTLES DRAWN AEROBIC AND ANAEROBIC Blood Culture adequate volume   Culture   Final    NO GROWTH 2 DAYS Performed at [facility], [place] [address] [place], [place] [address]    Report Status PENDING  Incomplete  Blood Culture (routine x 2)     Status: Abnormal   Collection Time: 12/23/19 10:05 AM   Specimen: BLOOD  Result Value Ref Range Status   Specimen Description BLOOD RIGHT  ANTECUBITAL  Final   Special Requests   Final    BOTTLES DRAWN AEROBIC AND ANAEROBIC Blood Culture adequate volume   Culture  Setup Time   Final    GRAM POSITIVE COCCI IN CLUSTERS AEROBIC BOTTLE ONLY CRITICAL RESULT CALLED TO, READ BACK BY AND VERIFIED WITH: PHARMD A SULLIVAN [id number] AT 26 AM BY CM    Culture (A)  Final    STAPHYLOCOCCUS HOMINIS THE SIGNIFICANCE OF ISOLATING THIS ORGANISM FROM A SINGLE SET OF BLOOD CULTURES WHEN MULTIPLE SETS ARE DRAWN IS UNCERTAIN. PLEASE NOTIFY THE MICROBIOLOGY DEPARTMENT WITHIN ONE WEEK  IF SPECIATION AND SENSITIVITIES ARE REQUIRED. Performed at [facility], [place] [address], [place], [place] [address]    Report Status 12/25/2019 FINAL  Final  Blood Culture ID Panel (Reflexed)     Status: Abnormal   Collection Time: 12/23/19 10:05 AM  Result Value Ref Range Status   Enterococcus faecalis NOT DETECTED NOT DETECTED Final   Enterococcus Faecium NOT DETECTED NOT DETECTED Final   Listeria monocytogenes NOT DETECTED NOT DETECTED Final   Staphylococcus species DETECTED (A) NOT DETECTED Final    Comment: CRITICAL RESULT CALLED TO, READ BACK BY AND VERIFIED WITH: PHARMD A SULLIVAN [id number] AT 737 AM BY CM    Staphylococcus aureus (BCID) NOT DETECTED NOT DETECTED Final   Staphylococcus epidermidis NOT DETECTED NOT DETECTED Final   Staphylococcus lugdunensis NOT DETECTED NOT DETECTED Final   Streptococcus species NOT DETECTED NOT DETECTED Final   Streptococcus agalactiae NOT DETECTED NOT DETECTED Final   Streptococcus pneumoniae NOT DETECTED NOT DETECTED Final   Streptococcus pyogenes NOT DETECTED NOT DETECTED Final   A.calcoaceticus-baumannii NOT DETECTED NOT DETECTED Final   Bacteroides fragilis NOT DETECTED NOT DETECTED Final   Enterobacterales NOT DETECTED NOT DETECTED Final   Enterobacter cloacae complex NOT DETECTED NOT DETECTED Final   Escherichia coli NOT DETECTED NOT DETECTED Final   Klebsiella aerogenes NOT DETECTED NOT DETECTED Final    Klebsiella oxytoca NOT DETECTED NOT DETECTED Final   Klebsiella pneumoniae NOT DETECTED NOT DETECTED Final   Proteus species NOT DETECTED NOT DETECTED Final   Salmonella species NOT DETECTED NOT DETECTED Final   Serratia marcescens NOT DETECTED NOT DETECTED Final   Haemophilus influenzae NOT DETECTED NOT DETECTED Final   Neisseria meningitidis NOT DETECTED NOT DETECTED Final   Pseudomonas aeruginosa NOT DETECTED NOT DETECTED Final   Stenotrophomonas maltophilia NOT DETECTED NOT DETECTED Final   Candida albicans NOT DETECTED NOT DETECTED Final   Candida auris NOT DETECTED NOT DETECTED Final   Candida glabrata NOT DETECTED NOT DETECTED Final   Candida krusei NOT DETECTED NOT DETECTED Final   Candida parapsilosis NOT DETECTED NOT DETECTED Final   Candida tropicalis NOT DETECTED NOT DETECTED Final   Cryptococcus neoformans/gattii NOT DETECTED NOT DETECTED Final    Comment: Performed at [facility], [address], [place], [place] [address]    Radiology Reports CT Head Wo Contrast  Result Date: 12/16/2019 CLINICAL DATA:  Headache COVID positive EXAM: CT HEAD WITHOUT CONTRAST TECHNIQUE: Contiguous axial images were obtained from the base of the skull through the vertex without intravenous contrast. COMPARISON:  None. FINDINGS: Brain: No evidence of acute territorial infarction, hemorrhage, hydrocephalus,extra-axial collection or mass lesion/mass effect. There is dilatation the ventricles and sulci consistent with age-related atrophy. Low-attenuation changes in the deep white matter consistent with small vessel ischemia. Vascular: No hyperdense vessel or unexpected calcification. Skull: The skull is intact. No fracture or focal lesion identified. Sinuses/Orbits: The visualized paranasal sinuses and mastoid air cells are clear. The orbits and globes intact. Other: None IMPRESSION: No acute intracranial abnormality. Findings consistent with age related atrophy and chronic small vessel ischemia  Electronically Signed   By: Prudencio Pair M.D.   On: 12/16/2019 23:29   CT ANGIO CHEST PE W OR WO CONTRAST  Result Date: 12/23/2019 CLINICAL DATA:  Short of breath, COVID positive EXAM: CT ANGIOGRAPHY CHEST WITH CONTRAST TECHNIQUE: Multidetector CT imaging of the chest was performed using the standard protocol during bolus administration of intravenous contrast. Multiplanar CT image reconstructions and MIPs were obtained to evaluate the  vascular anatomy. CONTRAST:  47m OMNIPAQUE IOHEXOL 350 MG/ML SOLN COMPARISON:  None. FINDINGS: Cardiovascular: Contrast bolus timing is optimal, but there is significant respiratory motion artifact. Evaluation of the pulmonary arteries is poor beyond proximal segmental level. There is no acute pulmonary embolism identified within this limitation. Enlargement of the main pulmonary artery suggesting pulmonary arterial hypertension. Cardiomegaly with moderate pericardial effusion. Reflux of contrast into the hepatic veins suggests right heart dysfunction. Mediastinum/Nodes: No enlarged lymph nodes identified. Lungs/Pleura: Suboptimal evaluation due to respiratory motion and imaging during expiration. Areas of subsegmental atelectasis. Patchy opacities, which may represent additional atelectasis, edema, or pneumonia. Bronchiectasis at the lung bases. Small bilateral pleural effusions. Pleural fluid extends into the right oblique fissure. Upper Abdomen: No acute abnormality. Musculoskeletal: No acute osseous abnormality. Review of the MIP images confirms the above findings. IMPRESSION: Suboptimal evaluation due to respiratory motion. No acute pulmonary embolism identified to the proximal segmental level. Cardiomegaly with moderate pericardial effusion. Evidence of pulmonary arterial hypertension. Scattered patchy pulmonary opacities, which may reflect atelectasis, edema and/or pneumonia. Given above and small bilateral pleural effusions, edema is favored. Electronically Signed   By:  PMacy MisM.D.   On: 12/23/2019 08:16   DG Chest Portable 1 View  Result Date: 12/21/2019 CLINICAL DATA:  Shortness of breath.  COVID for a week. EXAM: PORTABLE CHEST 1 VIEW COMPARISON:  12/16/2019. FINDINGS: Enlarged cardiac silhouette. Aortic atherosclerosis. No visible pneumothorax. New bibasilar airspace opacities. Limited evaluation for pleural effusions given bibasilar opacities and AP portable technique. No acute osseous abnormality. IMPRESSION: 1. New bibasilar airspace opacities, concerning for multifocal pneumonia. 2. Cardiomegaly. Electronically Signed   By: FMargaretha SheffieldMD   On: 12/21/2019 13:18   DG Chest Port 1 View  Result Date: 12/16/2019 CLINICAL DATA:  86year old female with positive COVID test. EXAM: PORTABLE CHEST 1 VIEW COMPARISON:  Chest radiograph dated 08/26/2018. FINDINGS: There is cardiomegaly with mild vascular congestion. No focal consolidation, pleural effusion, or pneumothorax. Minimal bibasilar densities, likely atelectasis. Atherosclerotic calcification of the aorta. No acute osseous pathology. IMPRESSION: Cardiomegaly with mild vascular congestion. No focal consolidation. Electronically Signed   By: AAnner CreteM.D.   On: 12/16/2019 21:33   VAS [place]OWER EXTREMITY VENOUS (DVT)  Result Date: 12/24/2019  Lower Venous DVT Study Indications: Elevated ddimer.  Comparison Study: no prior Performing Technologist: MAbram SanderRVS  Examination Guidelines: A complete evaluation includes B-mode imaging, spectral Doppler, color Doppler, and power Doppler as needed of all accessible portions of each vessel. Bilateral testing is considered an integral part of a complete examination. Limited examinations for reoccurring indications may be performed as noted. The reflux portion of the exam is performed with the patient in reverse Trendelenburg.  +---------+---------------+---------+-----------+----------+--------------+ RIGHT     CompressibilityPhasicitySpontaneityPropertiesThrombus Aging +---------+---------------+---------+-----------+----------+--------------+ CFV      Full           Yes      Yes                                 +---------+---------------+---------+-----------+----------+--------------+ SFJ      Full                                                        +---------+---------------+---------+-----------+----------+--------------+ FV Prox  Full                                                        +---------+---------------+---------+-----------+----------+--------------+  FV Mid   Full                                                        +---------+---------------+---------+-----------+----------+--------------+ FV DistalFull                                                        +---------+---------------+---------+-----------+----------+--------------+ PFV      Full                                                        +---------+---------------+---------+-----------+----------+--------------+ POP      Full           Yes      Yes                                 +---------+---------------+---------+-----------+----------+--------------+ PTV      Full                                                        +---------+---------------+---------+-----------+----------+--------------+ PERO     Full                                                        +---------+---------------+---------+-----------+----------+--------------+   +---------+---------------+---------+-----------+----------+-------------------+ LEFT     CompressibilityPhasicitySpontaneityPropertiesThrombus Aging      +---------+---------------+---------+-----------+----------+-------------------+ CFV      Full           Yes      Yes                                      +---------+---------------+---------+-----------+----------+-------------------+ SFJ      Full                                                              +---------+---------------+---------+-----------+----------+-------------------+ FV Prox  Full                                                             +---------+---------------+---------+-----------+----------+-------------------+ FV Mid                  Yes  Yes                                      +---------+---------------+---------+-----------+----------+-------------------+ FV Distal               Yes      Yes                                      +---------+---------------+---------+-----------+----------+-------------------+ PFV      Full                                                             +---------+---------------+---------+-----------+----------+-------------------+ POP      Full           Yes      Yes                                      +---------+---------------+---------+-----------+----------+-------------------+ PTV      Full                                                             +---------+---------------+---------+-----------+----------+-------------------+ PERO                                                  Not well visualized +---------+---------------+---------+-----------+----------+-------------------+     Summary: BILATERAL: - No evidence of deep vein thrombosis seen in the lower extremities, bilaterally. - No evidence of superficial venous thrombosis in the lower extremities, bilaterally. -No evidence of popliteal cyst, bilaterally.   *See table(s) above for measurements and observations. Electronically signed by Jamelle Haring on 12/24/2019 at 7:06:20 PM.    Final

## 2019-12-26 ENCOUNTER — Encounter (HOSPITAL_COMMUNITY): Payer: Self-pay | Admitting: Internal Medicine

## 2019-12-26 LAB — CBC WITH DIFFERENTIAL/PLATELET
Abs Immature Granulocytes: 0.18 10*3/uL — ABNORMAL HIGH (ref 0.00–0.07)
Basophils Absolute: 0 10*3/uL (ref 0.0–0.1)
Basophils Relative: 0 %
Eosinophils Absolute: 0 10*3/uL (ref 0.0–0.5)
Eosinophils Relative: 0 %
HCT: 34.9 % — ABNORMAL LOW (ref 36.0–46.0)
Hemoglobin: 11.5 g/dL — ABNORMAL LOW (ref 12.0–15.0)
Immature Granulocytes: 1 %
Lymphocytes Relative: 10 %
Lymphs Abs: 1.8 10*3/uL (ref 0.7–4.0)
MCH: 30.2 pg (ref 26.0–34.0)
MCHC: 33 g/dL (ref 30.0–36.0)
MCV: 91.6 fL (ref 80.0–100.0)
Monocytes Absolute: 1.6 10*3/uL — ABNORMAL HIGH (ref 0.1–1.0)
Monocytes Relative: 9 %
Neutro Abs: 15 10*3/uL — ABNORMAL HIGH (ref 1.7–7.7)
Neutrophils Relative %: 80 %
Platelets: 550 10*3/uL — ABNORMAL HIGH (ref 150–400)
RBC: 3.81 MIL/uL — ABNORMAL LOW (ref 3.87–5.11)
RDW: 13.9 % (ref 11.5–15.5)
WBC: 18.6 10*3/uL — ABNORMAL HIGH (ref 4.0–10.5)
nRBC: 0 % (ref 0.0–0.2)

## 2019-12-26 LAB — C-REACTIVE PROTEIN: CRP: 17.8 mg/dL — ABNORMAL HIGH (ref <1.0)

## 2019-12-26 LAB — COMPREHENSIVE METABOLIC PANEL
ALT: 33 U/L (ref 0–44)
AST: 31 U/L (ref 15–41)
Albumin: 2.4 g/dL — ABNORMAL LOW (ref 3.5–5.0)
Alkaline Phosphatase: 55 U/L (ref 38–126)
Anion gap: 11 (ref 5–15)
BUN: 24 mg/dL — ABNORMAL HIGH (ref 8–23)
CO2: 27 mmol/L (ref 22–32)
Calcium: 9.1 mg/dL (ref 8.9–10.3)
Chloride: 92 mmol/L — ABNORMAL LOW (ref 98–111)
Creatinine, Ser: 0.97 mg/dL (ref 0.44–1.00)
GFR, Estimated: 56 mL/min — ABNORMAL LOW (ref >60)
Glucose, Bld: 131 mg/dL — ABNORMAL HIGH (ref 70–99)
Potassium: 3.9 mmol/L (ref 3.5–5.1)
Sodium: 130 mmol/L — ABNORMAL LOW (ref 135–145)
Total Bilirubin: 0.8 mg/dL (ref 0.3–1.2)
Total Protein: 6.8 g/dL (ref 6.5–8.1)

## 2019-12-26 LAB — D-DIMER, QUANTITATIVE: D-Dimer, Quant: 3.33 ug/mL-FEU — ABNORMAL HIGH (ref 0.00–0.50)

## 2019-12-26 MED ORDER — PROCHLORPERAZINE EDISYLATE 10 MG/2ML IJ SOLN
10.0000 mg | Freq: Four times a day (QID) | INTRAMUSCULAR | Status: DC | PRN
  Administered 2019-12-26: 10 mg via INTRAVENOUS
  Filled 2019-12-26: qty 2

## 2019-12-26 MED ORDER — DILTIAZEM HCL ER COATED BEADS 180 MG PO CP24
180.0000 mg | ORAL_CAPSULE | Freq: Every day | ORAL | Status: DC
  Administered 2019-12-27: 180 mg via ORAL
  Filled 2019-12-26: qty 1

## 2019-12-26 NOTE — TOC Progression Note (Signed)
Transition of Care [facility]) - Progression Note    Patient Details  Name: [name] [name] MRN: [id number] Date of Birth: 11/03/1928  [facility] CM/SW Contact  Eugean Arnott, [place], [place] Phone Number: 12/26/2019, 3:37 PM  Clinical Narrative:    Patient now agreeable to SNF placement. Patient has bed offer for [facility]. Message left with admissions director for [facility].  FL2 completed and faxed  to [facility]. PASRR is pending, further clinical needed.   Transition of Care to continue to follow.   Zetha Kuhar [address] LCSW Transition of Care [phone]      Expected Discharge Plan: Home/Self Care Barriers to Discharge: Continued Medical Work up  Expected Discharge Plan and Services Expected Discharge Plan: Home/Self Care   Discharge Planning Services: CM Consult   Living arrangements for the past 2 months: Single Family Home                                       Social Determinants of Health (SDOH) Interventions    Readmission Risk Interventions No flowsheet data found.

## 2019-12-26 NOTE — Social Work (Signed)
To Whom It May Concern:  Please be advised that the above-named patient will require a short-term nursing home stay - anticipated 30 days or less for rehabilitation and strengthening.  The plan is for return home.  

## 2019-12-26 NOTE — Evaluation (Signed)
Physical Therapy Evaluation Patient Details Name: [name] [name] MRN: [id number] DOB: 1928-05-27 Today's Date: 12/26/2019   History of Present Illness  86 yo female presenting to ED per PCP on 12/21/19 with cough and hypoxia. Pt tested positive for COVID-19 during ED visit on 12/16/19 and dicharged. Chest x-ray revealed new bibasilar airspace opacities concerning for multifocal pneumonia. PMH including paroxysmal A. fib not on anticoagulation, aortic stenosis, hypertension, hyperlipidemia, rheumatoid arthritis, dementia, and depression.  Clinical Impression   Patient received in bed, very flat affect but pleasant and cooperative. Able to mobilize on a min guard-occasional MinA level with RW, did need cues for hand placement as well as VC for safety/sequencing with mobility. Very difficult to get an accurate pleth today- HR and SpO2 90s at rest on room air, however unable to get accurate signal with mobility- took quite awhile to get a good reading and by that time she had likely recovered from any potential desaturation. Would not rule out O2 drop below 90% with activity based on SOB/RR. Left up in recliner with all needs met, nursing staff aware of patient status. Per OT note, family is requesting SNF due to their concerns of being able to care for her.     Follow Up Recommendations SNF    Equipment Recommendations  Other (comment) (defer to next venue)    Recommendations for Other Services       Precautions / Restrictions Precautions Precautions: Fall Restrictions Weight Bearing Restrictions: No      Mobility  Bed Mobility Overal bed mobility: Needs Assistance Bed Mobility: Supine to Sit     Supine to sit: Supervision          Transfers Overall transfer level: Needs assistance Equipment used: Rolling walker (2 wheeled) Transfers: Sit to/from Stand Sit to Stand: Min guard         General transfer comment: Min guard A for safety, cues for hand  placement  Ambulation/Gait Ambulation/Gait assistance: Min guard Gait Distance (Feet): 30 Feet Assistive device: Rolling walker (2 wheeled) Gait Pattern/deviations: Step-through pattern;Trunk flexed;Narrow base of support     General Gait Details: easily fatigued but safe and steady with RW  Stairs            Wheelchair Mobility    Modified Rankin (Stroke Patients Only)       Balance Overall balance assessment: Needs assistance Sitting-balance support: No upper extremity supported;Feet supported Sitting balance-Leahy Scale: Fair     Standing balance support: During functional activity;No upper extremity supported Standing balance-Leahy Scale: Poor Standing balance comment: Reliant on UE support                             Pertinent Vitals/Pain Pain Assessment: No/denies pain    Home Living                        Prior Function                 Hand Dominance        Extremity/Trunk Assessment   Upper Extremity Assessment Upper Extremity Assessment: Defer to OT evaluation    Lower Extremity Assessment Lower Extremity Assessment: Generalized weakness    Cervical / Trunk Assessment Cervical / Trunk Assessment: Kyphotic  Communication      Cognition Arousal/Alertness: Awake/alert Behavior During Therapy: WFL for tasks assessed/performed;Flat affect Overall Cognitive Status: History of cognitive impairments - at baseline  General Comments: flat but pleasant, followed commands well      General Comments General comments (skin integrity, edema, etc.): Spo2 90s/HR 90s on RA; poor pleth during mobility preventing PT from getting accurate pulse ox reading, would not r/o desat    Exercises     Assessment/Plan    PT Assessment Patient needs continued PT services  PT Problem List Decreased strength;Decreased cognition;Decreased knowledge of use of DME;Decreased activity  tolerance;Decreased safety awareness;Decreased balance;Decreased mobility;Cardiopulmonary status limiting activity;Decreased coordination       PT Treatment Interventions DME instruction;Balance training;Gait training;Stair training;Cognitive remediation;Functional mobility training;Patient/family education;Therapeutic activities;Therapeutic exercise    PT Goals (Current goals can be found in the Care Plan section)  Acute Rehab PT Goals Patient Stated Goal: Go home PT Goal Formulation: With patient Time For Goal Achievement: 01/09/20 Potential to Achieve Goals: Fair    Frequency Min 3X/week   Barriers to discharge        Co-evaluation               AM-PAC PT "6 Clicks" Mobility  Outcome Measure Help needed turning from your back to your side while in a flat bed without using bedrails?: A Little Help needed moving from lying on your back to sitting on the side of a flat bed without using bedrails?: A Little Help needed moving to and from a bed to a chair (including a wheelchair)?: A Little Help needed standing up from a chair using your arms (e.g., wheelchair or bedside chair)?: A Little Help needed to walk in hospital room?: A Little Help needed climbing 3-5 steps with a railing? : A Lot 6 Click Score: 17    End of Session   Activity Tolerance: Patient tolerated treatment well Patient left: in chair;with call bell/phone within reach Nurse Communication: Mobility status PT Visit Diagnosis: Unsteadiness on feet (R26.81);Difficulty in walking, not elsewhere classified (R26.2);Muscle weakness (generalized) (M62.81)    Time: 4469-5072 PT Time Calculation (min) (ACUTE ONLY): 34 min   Charges:   PT Evaluation $PT Eval Moderate Complexity: 1 Mod PT Treatments $Gait Training: 8-22 mins        Windell Norfolk, DPT, PN1   Supplemental Physical Therapist [place]    Pager [phone] Acute Rehab Office [phone]

## 2019-12-26 NOTE — Progress Notes (Addendum)
PROGRESS NOTE                                                                             PROGRESS NOTE                                                                                                                                                                                                             Patient Demographics:    [name] [name], is a 86 y.o. female, DOB - 1928-12-15, URK:[id number]  Outpatient Primary MD for the patient is Merrilee Seashore, MD    LOS - 3  Admit date - 12/22/2019    Chief Complaint  Patient presents with  . Cough       Brief Narrative    HPI: [name] [name] is a 86 y.o. female with medical history significant of paroxysmal A. fib not on anticoagulation, aortic stenosis, hypertension, hyperlipidemia, rheumatoid arthritis, dementia, depression presenting the ED with with a chief complaint of cough.  Patient recently tested positive for COVID-19 during ED visit on 12/16/2019.  She did not have any hypoxemia at that time and was discharged.  Subsequently received outpatient bamlanivimab/etesevimab infusion on 12/18/2019.  Patient was then sent to the ED by her PCP on 12/21/2019 for evaluation of low oxygen saturation of 71% on room air.  She was satting 97% on room air at triage with no signs of respiratory distress.  Chest x-ray revealed new bibasilar airspace opacities concerning for multifocal pneumonia.  Patient left the ED before being evaluated by a physician.    Patient states she was sent to the ED by her doctor who told her she had pneumonia.  She has no complaints other than a cough.  Denies fevers, chills, body aches, fatigue, shortness of breath, chest pain, nausea, vomiting, abdominal pain, diarrhea, or dysuria.  No additional history could be obtained from her.  ED Course: Afebrile.  Not hypoxic at rest but sats dropped to 70s on room air with ambulation.  WBC 13.0, hemoglobin 11.5 (at baseline), platelet 474k.   Sodium  137, potassium 2.7, chloride 96, bicarb 28, BUN 28, creatinine 1.1, glucose 175.  AST 62, ALT 48.  Alk phos and T bili normal.  Lactic acid level normal.  Procalcitonin <0.10.  Inflammatory markers elevated: D-dimer 6.18, LDH 279, fibrinogen 798.  Ferritin and CRP levels pending.  Blood culture x2 pending.  Patient received IV Decadron 6 mg and remdesivir.     Subjective:    [name] [name] today denies any headache, shortness of breath or chest pain.   Assessment  & Plan :    Principal Problem:   Pneumonia due to COVID-19 virus Active Problems:   Atrial fibrillation ([place])   Acute hypoxemic respiratory failure (HCC)   Hypokalemia   Transaminitis   COVID-19 viral pneumonia  -She is vaccinated with Mederma in April . -imaging significant for bilateral opacities. -Continue with steroids and Remdesivir. -D Dimer is elevated, CTA chest with no evidence of PE, will check venous Dopplers, meanwhile keep on full dose anticoagulation and continue to trend D-dimers. -Continue to trend inflammatory markers, CRP and D-dimers trending down, but they are significantly elevated, for which we will continue with Decadron for now.  -She was encouraged to use incentive spirometry, flutter valve, out of bed to chair. -Received outpatient bamlanivimab/etesevimab infusion on 12/18/2019 -She is with elevated D-dimers, CTA with no PE, venous Dopplers with no DVT, will keep on 0.5 mg/kg every 12 hours till here D-dimer is less than 2.   COVID-19 Labs  Recent Labs    12/24/19 0244 12/25/19 0036 12/26/19 0349  DDIMER 6.15* 5.13* 3.33*  CRP 15.6* 15.3* 17.8*    Lab Results  Component Value Date   SARSCOV2NAA POSITIVE (A) 12/16/2019   [place] NEGATIVE 08/26/2018     Hypokalemia -Repleted,   1/4 blood culture with staph hominis, -Most likely contamination, no indication to treat, procalcitonin within normal limit.  Mild transaminitis -Late due to Covid infection, trending  down.  Hyponatremia -Discontinue hydrochlorothiazide,.  Paroxysmal A. Fib CHA2DS2VASc at least 4.  Not on chronic anticoagulation as she only had one single episode during hospital stay in 2020 with no recurrence . -Heart rate uncontrolled on admission, requiring Cardizem drip, she is transition to p.o. Cardizem with good control on cardizem 30 mg every 6 hours, has been consolidated to Cardizem CD at 120 mg p.o. daily.   -Keep K >4, and magnesium > 2.. -I have discussed with cardiology Dr. Burt Knack, patient is extremely high risk for falls, so frequent over the last year, with one fall where she remained on the floor for few days, risks outweighed benefits, no full dose anticoagulation, continue with baby aspirin.  Have discussed this and details with patient's daughter, she is agreeable with plan.  Hypertension -DC hydrochlorothiazide because of hyponatremia, will increase Cardizem CD to 180 mg.  Hyperlipidemia -Resume statin   Dementia -Resume home meds   Depression -Resume home meds  SpO2: 100 %  Recent Labs  Lab 12/23/19 0224 12/23/19 0956 12/23/19 1047 12/24/19 0244 12/25/19 0036 12/26/19 0349  WBC 13.0*  --   --  15.4* 15.4* 18.6*  PLT 474*  --   --  473* 531* 550*  CRP  --  19.3*  --  15.6* 15.3* 17.8*  DDIMER 6.18*  --   --  6.15* 5.13* 3.33*  PROCALCITON <0.10  --   --   --   --   --   AST 62*  --   --  59* 44* 31  ALT 48*  --   --  50* 44 33  ALKPHOS 57  --   --  56 55 55  BILITOT 0.8  --   --  0.8 0.8 0.8  ALBUMIN 2.4*  --   --  2.4* 2.4* 2.4*  LATICACIDVEN 1.7  --  1.7  --   --   --        ABG  No results found for: PHART, PCO2ART, PO2ART, HCO3, TCO2, ACIDBASEDEF, O2SAT         Condition - Extremely Guarded  Family communication: D/W daughter dianne 11/6  Code Status :  Full  Consults  :  none  Procedures  :  none  PUD Prophylaxis : protonix  Disposition Plan  :    Status is: Inpatient  Remains inpatient appropriate  because:Persistent severe electrolyte disturbances and IV treatments appropriate due to intensity of illness or inability to take PO   Dispo: The patient is from: Home              Anticipated d/c is to: SNF              Anticipated d/c date is: 1 day              Patient currently is medically stable to d/c. when SNF bed is available.      DVT Prophylaxis  : Full dose Lovenox  Lab Results  Component Value Date   PLT 550 (H) 12/26/2019    Diet :  Diet Order            Diet Heart Room service appropriate? Yes; Fluid consistency: Thin  Diet effective now                  Inpatient Medications  Scheduled Meds: . vitamin C  500 mg Oral Daily  . aspirin EC  81 mg Oral Daily  . cholecalciferol  1,000 Units Oral Daily  . dexamethasone (DECADRON) injection  6 mg Intravenous Q24H  . diltiazem  120 mg Oral Daily  . donepezil  10 mg Oral QHS  . enoxaparin (LOVENOX) injection  40 mg Subcutaneous Q12H  . hydrochlorothiazide  25 mg Oral Daily  . memantine  10 mg Oral BID  . mirabegron ER  25 mg Oral Daily  . oxybutynin  10 mg Oral Daily  . pantoprazole  40 mg Oral Daily  . rosuvastatin  20 mg Oral Daily  . sertraline  100 mg Oral Daily  . zinc sulfate  220 mg Oral Daily   Continuous Infusions: . remdesivir 100 mg in NS 100 mL 100 mg (12/26/19 0902)   PRN Meds:.acetaminophen, albuterol, guaiFENesin-dextromethorphan, metoprolol tartrate, prochlorperazine  Antibiotics  :    Anti-infectives (From admission, onward)   Start     Dose/Rate Route Frequency Ordered Stop   12/24/19 1000  remdesivir 100 mg in sodium chloride 0.9 % 100 mL IVPB       "Followed by" Linked Group Details   100 mg 200 mL/hr over 30 Minutes Intravenous Daily 12/23/19 0439 12/28/19 0959   12/23/19 0530  remdesivir 200 mg in sodium chloride 0.9% 250 mL IVPB       "Followed by" Linked Group Details   200 mg 580 mL/hr over 30 Minutes Intravenous Once 12/23/19 0439 12/23/19 0630        Maridel Pixler M.D on 12/26/2019 at 1:58 PM  To page go to [email or web address]   [facility] -  Office  [phone]      Objective:   Vitals:   12/25/19  0700 12/25/19 1500 12/25/19 2041 12/26/19 0439  BP:  (!) 156/91 136/90 (!) 142/77  Pulse:  87 64 72  Resp:  20 20 (!) 21  Temp:  98.6 F (37 C) 98.4 F (36.9 C) 98.2 F (36.8 C)  TempSrc:  Oral Oral Oral  SpO2: 95% 96% 98% 100%  Weight:      Height:        Wt Readings from Last 3 Encounters:  12/23/19 83 kg  10/28/19 84.5 kg  04/23/19 78 kg     Intake/Output Summary (Last 24 hours) at 12/26/2019 1358 Last data filed at 12/26/2019 1331 Gross per 24 hour  Intake 470 ml  Output --  Net 470 ml     Physical Exam  Awake Alert, Oriented X 3,frail  No new F.N deficits, Normal affect Symmetrical Chest wall movement, Good air movement bilaterally, CTAB Irr Irr ,No Gallops,Rubs or new Murmurs, No Parasternal Heave +ve B.Sounds, Abd Soft, No tenderness, No rebound - guarding or rigidity. No Cyanosis, Clubbing or edema, No new Rash or bruise     Data Review:    CBC Recent Labs  Lab 12/23/19 0224 12/24/19 0244 12/25/19 0036 12/26/19 0349  WBC 13.0* 15.4* 15.4* 18.6*  HGB 11.5* 10.4* 11.1* 11.5*  HCT 35.9* 31.8* 33.9* 34.9*  PLT 474* 473* 531* 550*  MCV 93.7 91.9 91.1 91.6  MCH 30.0 30.1 29.8 30.2  MCHC 32.0 32.7 32.7 33.0  RDW 14.0 13.9 13.8 13.9  LYMPHSABS 2.1 2.1 1.2 1.8  MONOABS 1.0 1.2* 1.3* 1.6*  EOSABS 0.1 0.1 0.0 0.0  BASOSABS 0.0 0.0 0.0 0.0    Recent Labs  Lab 12/23/19 0224 12/23/19 0956 12/23/19 1047 12/23/19 1540 12/24/19 0244 12/25/19 0036 12/26/19 0349  NA 137  --   --  136 133* 132* 130*  K 2.7*  --   --  3.9 3.4* 4.0 3.9  CL 96*  --   --  98 98 95* 92*  CO2 28  --   --  $R'27 24 27 27  'pv$ GLUCOSE 175*  --   --  130* 128* 125* 131*  BUN 28*  --   --  23 24* 22 24*  CREATININE 1.14*  --   --  1.00 0.98 0.91 0.97  CALCIUM 9.1  --   --  8.8* 8.8* 9.1 9.1  AST 62*  --   --   --  59* 44* 31   ALT 48*  --   --   --  50* 44 33  ALKPHOS 57  --   --   --  56 55 55  BILITOT 0.8  --   --   --  0.8 0.8 0.8  ALBUMIN 2.4*  --   --   --  2.4* 2.4* 2.4*  MG  --  1.8 1.8  --  1.7  --   --   CRP  --  19.3*  --   --  15.6* 15.3* 17.8*  DDIMER 6.18*  --   --   --  6.15* 5.13* 3.33*  PROCALCITON <0.10  --   --   --   --   --   --   LATICACIDVEN 1.7  --  1.7  --   --   --   --     ------------------------------------------------------------------------------------------------------------------ No results for input(s): CHOL, HDL, LDLCALC, TRIG, CHOLHDL, LDLDIRECT in the last 72 hours.  No results found for: HGBA1C ------------------------------------------------------------------------------------------------------------------ No results for input(s): TSH, T4TOTAL, T3FREE, THYROIDAB in the last  72 hours.  Invalid input(s): FREET3  Cardiac Enzymes No results for input(s): CKMB, TROPONINI, MYOGLOBIN in the last 168 hours.  Invalid input(s): CK ------------------------------------------------------------------------------------------------------------------ No results found for: BNP  Micro Results Recent Results (from the past 240 hour(s))  Respiratory Panel by RT PCR (Flu A&B, Covid) - Nasopharyngeal Swab     Status: Abnormal   Collection Time: 12/16/19  6:34 PM   Specimen: Nasopharyngeal Swab  Result Value Ref Range Status   SARS Coronavirus 2 by RT PCR POSITIVE (A) NEGATIVE Final    Comment: RESULT CALLED TO, READ BACK BY AND VERIFIED WITH: Joline Maxcy RN 12/16/19 2047 JDW (NOTE) SARS-CoV-2 target nucleic acids are DETECTED.  SARS-CoV-2 RNA is generally detectable in upper respiratory specimens  during the acute phase of infection. Positive results are indicative of the presence of the identified virus, but do not rule out bacterial infection or co-infection with other pathogens not detected by the test. Clinical correlation with patient history and other diagnostic  information is necessary to determine patient infection status. The expected result is Negative.  Fact Sheet for Patients:  PinkCheek.be  Fact Sheet for Healthcare Providers: GravelBags.it  This test is not yet approved or cleared by the [place] FDA and  has been authorized for detection and/or diagnosis of SARS-CoV-2 by FDA under an Emergency Use Authorization (EUA).  This EUA will remain in effect (meaning this test can be  used) for the duration of  the COVID-19 declaration under Section 564(b)(1) of the Act, 21 U.S.C. section 360bbb-3(b)(1), unless the authorization is terminated or revoked sooner.      Influenza A by PCR NEGATIVE NEGATIVE Final   Influenza B by PCR NEGATIVE NEGATIVE Final    Comment: (NOTE) The Xpert Xpress SARS-CoV-2/FLU/RSV assay is intended as an aid in  the diagnosis of influenza from Nasopharyngeal swab specimens and  should not be used as a sole basis for treatment. Nasal washings and  aspirates are unacceptable for Xpert Xpress SARS-CoV-2/FLU/RSV  testing.  Fact Sheet for Patients: PinkCheek.be  Fact Sheet for Healthcare Providers: GravelBags.it  This test is not yet approved or cleared by the [place] FDA and  has been authorized for detection and/or diagnosis of SARS-CoV-2 by  FDA under an Emergency Use Authorization (EUA). This EUA will remain  in effect (meaning this test can be used) for the duration of the  Covid-19 declaration under Section 564(b)(1) of the Act, 21  U.S.C. section 360bbb-3(b)(1), unless the authorization is  terminated or revoked. Performed at [facility], [place] [address] [place], [place] [address]   Blood Culture (routine x 2)     Status: None (Preliminary result)   Collection Time: 12/23/19  3:10 AM   Specimen: BLOOD  Result Value Ref Range Status   Specimen Description BLOOD RIGHT ARM   Final   Special Requests   Final    BOTTLES DRAWN AEROBIC AND ANAEROBIC Blood Culture adequate volume   Culture   Final    NO GROWTH 3 DAYS Performed at [facility], [place] [address], [place], [place] [address]    Report Status PENDING  Incomplete  Blood Culture (routine x 2)     Status: Abnormal   Collection Time: 12/23/19 10:05 AM   Specimen: BLOOD  Result Value Ref Range Status   Specimen Description BLOOD RIGHT ANTECUBITAL  Final   Special Requests   Final    BOTTLES DRAWN AEROBIC AND ANAEROBIC Blood Culture adequate volume   Culture  Setup Time  Final    GRAM POSITIVE COCCI IN CLUSTERS AEROBIC BOTTLE ONLY CRITICAL RESULT CALLED TO, READ BACK BY AND VERIFIED WITH: PHARMD A SULLIVAN [id number] AT 78 AM BY CM    Culture (A)  Final    STAPHYLOCOCCUS HOMINIS THE SIGNIFICANCE OF ISOLATING THIS ORGANISM FROM A SINGLE SET OF BLOOD CULTURES WHEN MULTIPLE SETS ARE DRAWN IS UNCERTAIN. PLEASE NOTIFY THE MICROBIOLOGY DEPARTMENT WITHIN ONE WEEK IF SPECIATION AND SENSITIVITIES ARE REQUIRED. Performed at [facility], [place] [address], [place], [place] [address]    Report Status 12/25/2019 FINAL  Final  Blood Culture ID Panel (Reflexed)     Status: Abnormal   Collection Time: 12/23/19 10:05 AM  Result Value Ref Range Status   Enterococcus faecalis NOT DETECTED NOT DETECTED Final   Enterococcus Faecium NOT DETECTED NOT DETECTED Final   Listeria monocytogenes NOT DETECTED NOT DETECTED Final   Staphylococcus species DETECTED (A) NOT DETECTED Final    Comment: CRITICAL RESULT CALLED TO, READ BACK BY AND VERIFIED WITH: PHARMD A SULLIVAN [id number] AT 737 AM BY CM    Staphylococcus aureus (BCID) NOT DETECTED NOT DETECTED Final   Staphylococcus epidermidis NOT DETECTED NOT DETECTED Final   Staphylococcus lugdunensis NOT DETECTED NOT DETECTED Final   Streptococcus species NOT DETECTED NOT DETECTED Final   Streptococcus agalactiae NOT DETECTED NOT DETECTED Final   Streptococcus pneumoniae  NOT DETECTED NOT DETECTED Final   Streptococcus pyogenes NOT DETECTED NOT DETECTED Final   A.calcoaceticus-baumannii NOT DETECTED NOT DETECTED Final   Bacteroides fragilis NOT DETECTED NOT DETECTED Final   Enterobacterales NOT DETECTED NOT DETECTED Final   Enterobacter cloacae complex NOT DETECTED NOT DETECTED Final   Escherichia coli NOT DETECTED NOT DETECTED Final   Klebsiella aerogenes NOT DETECTED NOT DETECTED Final   Klebsiella oxytoca NOT DETECTED NOT DETECTED Final   Klebsiella pneumoniae NOT DETECTED NOT DETECTED Final   Proteus species NOT DETECTED NOT DETECTED Final   Salmonella species NOT DETECTED NOT DETECTED Final   Serratia marcescens NOT DETECTED NOT DETECTED Final   Haemophilus influenzae NOT DETECTED NOT DETECTED Final   Neisseria meningitidis NOT DETECTED NOT DETECTED Final   Pseudomonas aeruginosa NOT DETECTED NOT DETECTED Final   Stenotrophomonas maltophilia NOT DETECTED NOT DETECTED Final   Candida albicans NOT DETECTED NOT DETECTED Final   Candida auris NOT DETECTED NOT DETECTED Final   Candida glabrata NOT DETECTED NOT DETECTED Final   Candida krusei NOT DETECTED NOT DETECTED Final   Candida parapsilosis NOT DETECTED NOT DETECTED Final   Candida tropicalis NOT DETECTED NOT DETECTED Final   Cryptococcus neoformans/gattii NOT DETECTED NOT DETECTED Final    Comment: Performed at [facility], [address], [place], [place] [address]    Radiology Reports CT Head Wo Contrast  Result Date: 12/16/2019 CLINICAL DATA:  Headache COVID positive EXAM: CT HEAD WITHOUT CONTRAST TECHNIQUE: Contiguous axial images were obtained from the base of the skull through the vertex without intravenous contrast. COMPARISON:  None. FINDINGS: Brain: No evidence of acute territorial infarction, hemorrhage, hydrocephalus,extra-axial collection or mass lesion/mass effect. There is dilatation the ventricles and sulci consistent with age-related atrophy. Low-attenuation changes in the  deep white matter consistent with small vessel ischemia. Vascular: No hyperdense vessel or unexpected calcification. Skull: The skull is intact. No fracture or focal lesion identified. Sinuses/Orbits: The visualized paranasal sinuses and mastoid air cells are clear. The orbits and globes intact. Other: None IMPRESSION: No acute intracranial abnormality. Findings consistent with age related atrophy and chronic small vessel ischemia Electronically  Signed   By: Prudencio Pair M.D.   On: 12/16/2019 23:29   CT ANGIO CHEST PE W OR WO CONTRAST  Result Date: 12/23/2019 CLINICAL DATA:  Short of breath, COVID positive EXAM: CT ANGIOGRAPHY CHEST WITH CONTRAST TECHNIQUE: Multidetector CT imaging of the chest was performed using the standard protocol during bolus administration of intravenous contrast. Multiplanar CT image reconstructions and MIPs were obtained to evaluate the vascular anatomy. CONTRAST:  49mL OMNIPAQUE IOHEXOL 350 MG/ML SOLN COMPARISON:  None. FINDINGS: Cardiovascular: Contrast bolus timing is optimal, but there is significant respiratory motion artifact. Evaluation of the pulmonary arteries is poor beyond proximal segmental level. There is no acute pulmonary embolism identified within this limitation. Enlargement of the main pulmonary artery suggesting pulmonary arterial hypertension. Cardiomegaly with moderate pericardial effusion. Reflux of contrast into the hepatic veins suggests right heart dysfunction. Mediastinum/Nodes: No enlarged lymph nodes identified. Lungs/Pleura: Suboptimal evaluation due to respiratory motion and imaging during expiration. Areas of subsegmental atelectasis. Patchy opacities, which may represent additional atelectasis, edema, or pneumonia. Bronchiectasis at the lung bases. Small bilateral pleural effusions. Pleural fluid extends into the right oblique fissure. Upper Abdomen: No acute abnormality. Musculoskeletal: No acute osseous abnormality. Review of the MIP images confirms the  above findings. IMPRESSION: Suboptimal evaluation due to respiratory motion. No acute pulmonary embolism identified to the proximal segmental level. Cardiomegaly with moderate pericardial effusion. Evidence of pulmonary arterial hypertension. Scattered patchy pulmonary opacities, which may reflect atelectasis, edema and/or pneumonia. Given above and small bilateral pleural effusions, edema is favored. Electronically Signed   By: Macy Mis M.D.   On: 12/23/2019 08:16   DG Chest Portable 1 View  Result Date: 12/21/2019 CLINICAL DATA:  Shortness of breath.  COVID for a week. EXAM: PORTABLE CHEST 1 VIEW COMPARISON:  12/16/2019. FINDINGS: Enlarged cardiac silhouette. Aortic atherosclerosis. No visible pneumothorax. New bibasilar airspace opacities. Limited evaluation for pleural effusions given bibasilar opacities and AP portable technique. No acute osseous abnormality. IMPRESSION: 1. New bibasilar airspace opacities, concerning for multifocal pneumonia. 2. Cardiomegaly. Electronically Signed   By: Margaretha Sheffield MD   On: 12/21/2019 13:18   DG Chest Port 1 View  Result Date: 12/16/2019 CLINICAL DATA:  86 year old female with positive COVID test. EXAM: PORTABLE CHEST 1 VIEW COMPARISON:  Chest radiograph dated 08/26/2018. FINDINGS: There is cardiomegaly with mild vascular congestion. No focal consolidation, pleural effusion, or pneumothorax. Minimal bibasilar densities, likely atelectasis. Atherosclerotic calcification of the aorta. No acute osseous pathology. IMPRESSION: Cardiomegaly with mild vascular congestion. No focal consolidation. Electronically Signed   By: Anner Crete M.D.   On: 12/16/2019 21:33   VAS [place] LOWER EXTREMITY VENOUS (DVT)  Result Date: 12/24/2019  Lower Venous DVT Study Indications: Elevated ddimer.  Comparison Study: no prior Performing Technologist: Abram Sander RVS  Examination Guidelines: A complete evaluation includes B-mode imaging, spectral Doppler, color Doppler, and  power Doppler as needed of all accessible portions of each vessel. Bilateral testing is considered an integral part of a complete examination. Limited examinations for reoccurring indications may be performed as noted. The reflux portion of the exam is performed with the patient in reverse Trendelenburg.  +---------+---------------+---------+-----------+----------+--------------+ RIGHT    CompressibilityPhasicitySpontaneityPropertiesThrombus Aging +---------+---------------+---------+-----------+----------+--------------+ CFV      Full           Yes      Yes                                 +---------+---------------+---------+-----------+----------+--------------+  SFJ      Full                                                        +---------+---------------+---------+-----------+----------+--------------+ FV Prox  Full                                                        +---------+---------------+---------+-----------+----------+--------------+ FV Mid   Full                                                        +---------+---------------+---------+-----------+----------+--------------+ FV DistalFull                                                        +---------+---------------+---------+-----------+----------+--------------+ PFV      Full                                                        +---------+---------------+---------+-----------+----------+--------------+ POP      Full           Yes      Yes                                 +---------+---------------+---------+-----------+----------+--------------+ PTV      Full                                                        +---------+---------------+---------+-----------+----------+--------------+ PERO     Full                                                        +---------+---------------+---------+-----------+----------+--------------+    +---------+---------------+---------+-----------+----------+-------------------+ LEFT     CompressibilityPhasicitySpontaneityPropertiesThrombus Aging      +---------+---------------+---------+-----------+----------+-------------------+ CFV      Full           Yes      Yes                                      +---------+---------------+---------+-----------+----------+-------------------+ SFJ      Full                                                             +---------+---------------+---------+-----------+----------+-------------------+  FV Prox  Full                                                             +---------+---------------+---------+-----------+----------+-------------------+ FV Mid                  Yes      Yes                                      +---------+---------------+---------+-----------+----------+-------------------+ FV Distal               Yes      Yes                                      +---------+---------------+---------+-----------+----------+-------------------+ PFV      Full                                                             +---------+---------------+---------+-----------+----------+-------------------+ POP      Full           Yes      Yes                                      +---------+---------------+---------+-----------+----------+-------------------+ PTV      Full                                                             +---------+---------------+---------+-----------+----------+-------------------+ PERO                                                  Not well visualized +---------+---------------+---------+-----------+----------+-------------------+     Summary: BILATERAL: - No evidence of deep vein thrombosis seen in the lower extremities, bilaterally. - No evidence of superficial venous thrombosis in the lower extremities, bilaterally. -No evidence of popliteal cyst, bilaterally.   *See  table(s) above for measurements and observations. Electronically signed by Jamelle Haring on 12/24/2019 at 7:06:20 PM.    Final

## 2019-12-26 NOTE — Plan of Care (Signed)
Patient is currently resting in bed. OOB w/ walker to BR. VSS. Remains on RA. C/o nausea this AM, given compazine PRN. Given crackers and ginger ale. Call bell within reach. Bed alarm on.  Problem: Education: Goal: Knowledge of General Education information will improve Description: Including pain rating scale, medication(s)/side effects and non-pharmacologic comfort measures Outcome: Progressing   Problem: Health Behavior/Discharge Planning: Goal: Ability to manage health-related needs will improve Outcome: Progressing   Problem: Clinical Measurements: Goal: Ability to maintain clinical measurements within normal limits will improve Outcome: Progressing Goal: Will remain free from infection Outcome: Progressing Goal: Diagnostic test results will improve Outcome: Progressing Goal: Respiratory complications will improve Outcome: Progressing Goal: Cardiovascular complication will be avoided Outcome: Progressing   Problem: Activity: Goal: Risk for activity intolerance will decrease Outcome: Progressing   Problem: Nutrition: Goal: Adequate nutrition will be maintained Outcome: Progressing   Problem: Coping: Goal: Level of anxiety will decrease Outcome: Progressing   Problem: Elimination: Goal: Will not experience complications related to bowel motility Outcome: Progressing Goal: Will not experience complications related to urinary retention Outcome: Progressing   Problem: Pain Managment: Goal: General experience of comfort will improve Outcome: Progressing   Problem: Safety: Goal: Ability to remain free from injury will improve Outcome: Progressing   Problem: Skin Integrity: Goal: Risk for impaired skin integrity will decrease Outcome: Progressing   Problem: Education: Goal: Knowledge of risk factors and measures for prevention of condition will improve Outcome: Progressing   Problem: Coping: Goal: Psychosocial and spiritual needs will be supported Outcome:  Progressing   Problem: Respiratory: Goal: Will maintain a patent airway Outcome: Progressing Goal: Complications related to the disease process, condition or treatment will be avoided or minimized Outcome: Progressing

## 2019-12-26 NOTE — NC FL2 (Signed)
[place] MEDICAID FL2 LEVEL OF CARE SCREENING TOOL     IDENTIFICATION  Patient Name: [name] [name] Birthdate: [birth year] Sex: female Admission Date (Current Location): 12/22/2019  [facility] [place] Number:  Herbalist and Address:  [facility], [place] [address], [place], [place] [address]      Provider Number:    Attending Physician Name and Address:  Elgergawy, Silver Huguenin, MD  Relative Name and Phone Number:  Patriciaann Clan": Gonzalo [phone]    Current Level of Care: SNF Recommended Level of Care: [place] Prior Approval Number:    Date Approved/Denied:   PASRR Number: pending  Discharge Plan: SNF    Current Diagnoses: Patient Active Problem List   Diagnosis Date Noted  . Pneumonia due to COVID-19 virus 12/23/2019  . Acute hypoxemic respiratory failure ([place]) 12/23/2019  . Hypokalemia 12/23/2019  . Transaminitis 12/23/2019  . Slow heart rate 03/13/2019  . Mild neurocognitive disorder 02/26/2019  . Clavi 01/20/2019  . Porokeratosis 09/02/2018  . Corns and callosities 09/02/2018  . Elevated CK   . Pressure injury of skin 08/27/2018  . Atrial fibrillation ([place]) 08/27/2018  . Hypernatremia   . Rhabdomyolysis 08/26/2018  . Normocytic anemia 08/26/2018  . Renal insufficiency 08/26/2018  . Community acquired pneumonia 01/13/2013  . Sepsis ([place]) 01/13/2013  . Hyponatremia 01/13/2013  . Hypochloremia 01/13/2013  . Rheumatoid arthritis ([place]) 01/13/2013  . Premature atrial contractions 07/22/2012  . Hyperlipidemia 07/22/2012  . Carotid artery bruit 07/22/2012  . Aortic stenosis 07/22/2012  . Chest pain   . HTN (hypertension)     Orientation RESPIRATION BLADDER Height & Weight     Self, Time, Situation, Place  Normal Incontinent Weight: 183 lb (83 kg) Height:  5\' 6"  (167.6 cm)  BEHAVIORAL SYMPTOMS/MOOD NEUROLOGICAL BOWEL NUTRITION STATUS      Continent Diet  AMBULATORY STATUS COMMUNICATION OF NEEDS Skin    Limited Assist Verbally Normal                       Personal Care Assistance Level of Assistance  Feeding, Dressing, Bathing Bathing Assistance: Limited assistance Feeding assistance: Limited assistance Dressing Assistance: Limited assistance     Functional Limitations Info  Sight, Hearing, Speech Sight Info: Adequate Hearing Info: Adequate Speech Info: Adequate    SPECIAL CARE FACTORS FREQUENCY  PT (By licensed PT), OT (By licensed OT)     PT Frequency: 5x per week OT Frequency: 5x per week            Contractures Contractures Info: Not present    Additional Factors Info  Code Status Code Status Info: full code             Current Medications (12/26/2019):  This is the current hospital active medication list Current Facility-Administered Medications  Medication Dose Route Frequency Provider Last Rate Last Admin  . acetaminophen (TYLENOL) tablet 650 mg  650 mg Oral Q6H PRN Shela Leff, MD      . albuterol (VENTOLIN HFA) 108 (90 Base) MCG/ACT inhaler 2 puff  2 puff Inhalation Q6H PRN Shela Leff, MD      . ascorbic acid (VITAMIN C) tablet 500 mg  500 mg Oral Daily Shela Leff, MD   500 mg at 12/26/19 0901  . aspirin EC tablet 81 mg  81 mg Oral Daily Elgergawy, Silver Huguenin, MD   81 mg at 12/26/19 0900  . cholecalciferol (VITAMIN D3) tablet 1,000 Units  1,000 Units Oral Daily Shela Leff,  MD   1,000 Units at 12/26/19 0900  . dexamethasone (DECADRON) injection 6 mg  6 mg Intravenous Q24H Elgergawy, Silver Huguenin, MD   6 mg at 12/26/19 0504  . [START ON 12/27/2019] diltiazem (CARDIZEM CD) 24 hr capsule 180 mg  180 mg Oral Daily Elgergawy, Silver Huguenin, MD      . donepezil (ARICEPT) tablet 10 mg  10 mg Oral QHS Elgergawy, Silver Huguenin, MD   10 mg at 12/25/19 2125  . enoxaparin (LOVENOX) injection 40 mg  40 mg Subcutaneous Q12H Elgergawy, Silver Huguenin, MD   40 mg at 12/26/19 0505  . guaiFENesin-dextromethorphan (ROBITUSSIN DM) 100-10 MG/5ML syrup 10 mL  10 mL  Oral Q4H PRN Shela Leff, MD   10 mL at 12/23/19 2235  . memantine (NAMENDA) tablet 10 mg  10 mg Oral BID Elgergawy, Silver Huguenin, MD   10 mg at 12/26/19 0900  . metoprolol tartrate (LOPRESSOR) injection 5 mg  5 mg Intravenous Q4H PRN Shela Leff, MD      . mirabegron ER (MYRBETRIQ) tablet 25 mg  25 mg Oral Daily Elgergawy, Silver Huguenin, MD   25 mg at 12/26/19 0901  . oxybutynin (DITROPAN-XL) 24 hr tablet 10 mg  10 mg Oral Daily Elgergawy, Silver Huguenin, MD   10 mg at 12/26/19 0920  . pantoprazole (PROTONIX) EC tablet 40 mg  40 mg Oral Daily Elgergawy, Silver Huguenin, MD   40 mg at 12/26/19 0900  . prochlorperazine (COMPAZINE) injection 10 mg  10 mg Intravenous Q6H PRN Vernelle Emerald, MD   10 mg at 12/26/19 0504  . remdesivir 100 mg in sodium chloride 0.9 % 100 mL IVPB  100 mg Intravenous Daily Shela Leff, MD 200 mL/hr at 12/26/19 0902 100 mg at 12/26/19 0902  . rosuvastatin (CRESTOR) tablet 20 mg  20 mg Oral Daily Elgergawy, Silver Huguenin, MD   20 mg at 12/26/19 0901  . sertraline (ZOLOFT) tablet 100 mg  100 mg Oral Daily Elgergawy, Silver Huguenin, MD   100 mg at 12/26/19 0900  . zinc sulfate capsule 220 mg  220 mg Oral Daily Shela Leff, MD   220 mg at 12/26/19 0900     Discharge Medications: Please see discharge summary for a list of discharge medications.  Relevant Imaging Results:  Relevant Lab Results:   Additional Information Soc. Sec [id number]  Elliot Gurney Teachey,

## 2019-12-26 NOTE — TOC Progression Note (Signed)
Transition of Care [facility]) - Progression Note    Patient Details  Name: [name] [name] MRN: [id number] Date of Birth: 10-Jun-1928  Transition of Care [facility]) CM/SW [place], RN Phone Number: 12/26/2019, 10:07 AM  Clinical Narrative:    Received a call back from daughter Levander Campion [facility]) patient was living in [place]. She has a Runner, broadcasting/film/video alert but does not use it often. She has fallen several times and sometimes will not tell family. She has daughter Levander Campion, who has health issues and son who works living here. They cannot be quarantined 24/7. Both have tested negative. for COVID.Only options for patient is SNF or her son is willing to come get her and take her to [place]. Levander Campion states she will call and talk to her mother about this. The patient had refused SNFlast time she was in [place] states she would prefer [facility] or [facility] however she is aware that if  Only one accepts that will be where she will be placed. CSW and RNCM securechatted about conversation.    Expected Discharge Plan: Home/Self Care Barriers to Discharge: Continued Medical Work up  Expected Discharge Plan and Services Expected Discharge Plan: Home/Self Care   Discharge Planning Services: CM Consult   Living arrangements for the past 2 months: Single Family Home                                       Social Determinants of Health (SDOH) Interventions    Readmission Risk Interventions No flowsheet data found.

## 2019-12-27 DIAGNOSIS — E871 Hypo-osmolality and hyponatremia: Secondary | ICD-10-CM

## 2019-12-27 LAB — CBC WITH DIFFERENTIAL/PLATELET
Abs Immature Granulocytes: 0.14 10*3/uL — ABNORMAL HIGH (ref 0.00–0.07)
Basophils Absolute: 0 10*3/uL (ref 0.0–0.1)
Basophils Relative: 0 %
Eosinophils Absolute: 0 10*3/uL (ref 0.0–0.5)
Eosinophils Relative: 0 %
HCT: 34 % — ABNORMAL LOW (ref 36.0–46.0)
Hemoglobin: 11.5 g/dL — ABNORMAL LOW (ref 12.0–15.0)
Immature Granulocytes: 1 %
Lymphocytes Relative: 8 %
Lymphs Abs: 1.2 10*3/uL (ref 0.7–4.0)
MCH: 30.4 pg (ref 26.0–34.0)
MCHC: 33.8 g/dL (ref 30.0–36.0)
MCV: 89.9 fL (ref 80.0–100.0)
Monocytes Absolute: 0.9 10*3/uL (ref 0.1–1.0)
Monocytes Relative: 6 %
Neutro Abs: 13 10*3/uL — ABNORMAL HIGH (ref 1.7–7.7)
Neutrophils Relative %: 85 %
Platelets: 546 10*3/uL — ABNORMAL HIGH (ref 150–400)
RBC: 3.78 MIL/uL — ABNORMAL LOW (ref 3.87–5.11)
RDW: 13.8 % (ref 11.5–15.5)
WBC: 15.3 10*3/uL — ABNORMAL HIGH (ref 4.0–10.5)
nRBC: 0 % (ref 0.0–0.2)

## 2019-12-27 LAB — COMPREHENSIVE METABOLIC PANEL
ALT: 30 U/L (ref 0–44)
AST: 30 U/L (ref 15–41)
Albumin: 2.3 g/dL — ABNORMAL LOW (ref 3.5–5.0)
Alkaline Phosphatase: 55 U/L (ref 38–126)
Anion gap: 12 (ref 5–15)
BUN: 25 mg/dL — ABNORMAL HIGH (ref 8–23)
CO2: 26 mmol/L (ref 22–32)
Calcium: 8.8 mg/dL — ABNORMAL LOW (ref 8.9–10.3)
Chloride: 89 mmol/L — ABNORMAL LOW (ref 98–111)
Creatinine, Ser: 0.85 mg/dL (ref 0.44–1.00)
GFR, Estimated: >60 mL/min (ref >60)
Glucose, Bld: 139 mg/dL — ABNORMAL HIGH (ref 70–99)
Potassium: 3.6 mmol/L (ref 3.5–5.1)
Sodium: 127 mmol/L — ABNORMAL LOW (ref 135–145)
Total Bilirubin: 1 mg/dL (ref 0.3–1.2)
Total Protein: 6.6 g/dL (ref 6.5–8.1)

## 2019-12-27 LAB — C-REACTIVE PROTEIN: CRP: 17 mg/dL — ABNORMAL HIGH (ref <1.0)

## 2019-12-27 LAB — D-DIMER, QUANTITATIVE: D-Dimer, Quant: 5.19 ug/mL-FEU — ABNORMAL HIGH (ref 0.00–0.50)

## 2019-12-27 LAB — SODIUM, URINE, RANDOM: Sodium, Ur: 44 mmol/L

## 2019-12-27 LAB — OSMOLALITY, URINE: Osmolality, Ur: 749 mOsm/kg (ref 300–900)

## 2019-12-27 MED ORDER — DILTIAZEM HCL 60 MG PO TABS
30.0000 mg | ORAL_TABLET | Freq: Once | ORAL | Status: AC
  Administered 2019-12-27: 30 mg via ORAL
  Filled 2019-12-27: qty 1

## 2019-12-27 MED ORDER — POTASSIUM CHLORIDE CRYS ER 20 MEQ PO TBCR
40.0000 meq | EXTENDED_RELEASE_TABLET | Freq: Once | ORAL | Status: AC
  Administered 2019-12-27: 40 meq via ORAL
  Filled 2019-12-27: qty 2

## 2019-12-27 MED ORDER — DILTIAZEM HCL ER COATED BEADS 240 MG PO CP24
240.0000 mg | ORAL_CAPSULE | Freq: Every day | ORAL | Status: DC
  Administered 2019-12-28 – 2019-12-29 (×2): 240 mg via ORAL
  Filled 2019-12-27 (×2): qty 1

## 2019-12-27 MED ORDER — SODIUM CHLORIDE 1 G PO TABS
2.0000 g | ORAL_TABLET | Freq: Two times a day (BID) | ORAL | Status: AC
  Administered 2019-12-27 (×2): 2 g via ORAL
  Filled 2019-12-27 (×3): qty 2

## 2019-12-27 NOTE — Progress Notes (Signed)
PROGRESS NOTE                                                                             PROGRESS NOTE                                                                                                                                                                                                             Patient Demographics:    [name] [name], is a 86 y.o. female, DOB - [birth year], VOJ:[id number]  Outpatient Primary MD for the patient is Merrilee Seashore, MD    LOS - 4  Admit date - 12/22/2019    Chief Complaint  Patient presents with  . Cough       Brief Narrative    HPI: [name] [name] is a 86 y.o. female with medical history significant of paroxysmal A. fib not on anticoagulation, aortic stenosis, hypertension, hyperlipidemia, rheumatoid arthritis, dementia, depression presenting the ED with with a chief complaint of cough.  Patient recently tested positive for COVID-19 during ED visit on 12/16/2019.  She did not have any hypoxemia at that time and was discharged.  Subsequently received outpatient bamlanivimab/etesevimab infusion on 12/18/2019.  Patient was then sent to the ED by her PCP on 12/21/2019 for evaluation of low oxygen saturation of 71% on room air.  She was satting 97% on room air at triage with no signs of respiratory distress.  Chest x-ray revealed new bibasilar airspace opacities concerning for multifocal pneumonia.  Patient left the ED before being evaluated by a physician.    Patient states she was sent to the ED by her doctor who told her she had pneumonia.  She has no complaints other than a cough.  Denies fevers, chills, body aches, fatigue, shortness of breath, chest pain, nausea, vomiting, abdominal pain, diarrhea, or dysuria.  No additional history could be obtained from her.  ED Course: Afebrile.  Not hypoxic at rest but sats dropped to 70s on room air with ambulation.  WBC 13.0, hemoglobin 11.5 (at baseline), platelet 474k.   Sodium  137, potassium 2.7, chloride 96, bicarb 28, BUN 28, creatinine 1.1, glucose 175.  AST 62, ALT 48.  Alk phos and T bili normal.  Lactic acid level normal.  Procalcitonin <0.10.  Inflammatory markers elevated: D-dimer 6.18, LDH 279, fibrinogen 798.  Ferritin and CRP levels pending.  Blood culture x2 pending.  Patient received IV Decadron 6 mg and remdesivir.     Subjective:    [name] [name] today denies any headache, chest pain or shortness of breath, she reports he has been drinking a lot of fluids recently.     Assessment  & Plan :    Principal Problem:   Pneumonia due to COVID-19 virus Active Problems:   Atrial fibrillation ([place])   Acute hypoxemic respiratory failure (HCC)   Hypokalemia   Transaminitis   COVID-19 viral pneumonia  -She is vaccinated with Mederma in April . -imaging significant for bilateral opacities. -Continue with steroids and Remdesivir. -D Dimer is elevated, CTA chest with no evidence of PE, will check venous Dopplers, meanwhile keep on full dose anticoagulation and continue to trend D-dimers. -Continue to trend inflammatory markers, CRP and D-dimers trending down, but they are significantly elevated, for which we will continue with Decadron for now.  -She was encouraged to use incentive spirometry, flutter valve, out of bed to chair. -Received outpatient bamlanivimab/etesevimab infusion on 12/18/2019 -She is with elevated D-dimers, CTA with no PE, venous Dopplers with no DVT, will keep on 0.5 mg/kg every 12 hours till here D-dimer is less than 2.   COVID-19 Labs  Recent Labs    12/25/19 0036 12/26/19 0349 12/27/19 0317  DDIMER 5.13* 3.33* 5.19*  CRP 15.3* 17.8* 17.0*    Lab Results  Component Value Date   SARSCOV2NAA POSITIVE (A) 12/16/2019   [place] NEGATIVE 08/26/2018     Hypokalemia -Repleted,   1/4 blood culture with staph hominis, -Most likely contamination, no indication to treat, procalcitonin within normal limit.  Mild  transaminitis -Late due to Covid infection, trending down.  Hyponatremia -Sodium continues to drop at 127 this morning despite stopping hydrochlorothiazide, so I have put her on fluid restriction, will give sodium tablets today, check urine sodium, urine osmolality and repeat in a.m.  Paroxysmal A. Fib CHA2DS2VASc at least 4.  Not on chronic anticoagulation as she only had one single episode during hospital stay in 2020 with no recurrence .   -Keep K >4, and magnesium > 2.. -I have discussed with cardiology Dr. Burt Knack, patient is extremely high risk for falls, so frequent over the last year, with one fall where she remained on the floor for few days, risks outweighed benefits, no full dose anticoagulation, continue with baby aspirin.  Have discussed this and details with patient's daughter, she is agreeable with plan. -Creatinine is elevated, as well blood pressure on the higher side, so we will increase Cardizem CD to 240 mg oral daily.  Hypertension -DC hydrochlorothiazide because of hyponatremia, will increase Cardizem CD to 180 mg.  Hyperlipidemia -Resume statin   Dementia -Resume home meds   Depression -Resume home meds  SpO2: 91 %  Recent Labs  Lab 12/23/19 0224 12/23/19 0956 12/23/19 1047 12/24/19 0244 12/25/19 0036 12/26/19 0349 12/27/19 0317  WBC 13.0*  --   --  15.4* 15.4* 18.6* 15.3*  PLT 474*  --   --  473* 531* 550* 546*  CRP  --  19.3*  --  15.6* 15.3* 17.8* 17.0*  DDIMER 6.18*  --   --  6.15* 5.13* 3.33* 5.19*  PROCALCITON <  0.10  --   --   --   --   --   --   AST 62*  --   --  59* 44* 31 30  ALT 48*  --   --  50* 44 33 30  ALKPHOS 57  --   --  56 55 55 55  BILITOT 0.8  --   --  0.8 0.8 0.8 1.0  ALBUMIN 2.4*  --   --  2.4* 2.4* 2.4* 2.3*  LATICACIDVEN 1.7  --  1.7  --   --   --   --        ABG  No results found for: PHART, PCO2ART, PO2ART, HCO3, TCO2, ACIDBASEDEF, O2SAT         Condition - Extremely Guarded  Family communication: D/W  daughter dianne 11/6  Code Status :  Full  Consults  :  none  Procedures  :  none  PUD Prophylaxis : protonix  Disposition Plan  :    Status is: Inpatient  Remains inpatient appropriate because:Persistent severe electrolyte disturbances and IV treatments appropriate due to intensity of illness or inability to take PO   Dispo: The patient is from: Home              Anticipated d/c is to: SNF              Anticipated d/c date is: 1 day              Patient currently is medically stable to d/c. when SNF bed is available.      DVT Prophylaxis  : Full dose Lovenox  Lab Results  Component Value Date   PLT 546 (H) 12/27/2019    Diet :  Diet Order            Diet Heart Room service appropriate? Yes; Fluid consistency: Thin  Diet effective now                  Inpatient Medications  Scheduled Meds: . vitamin C  500 mg Oral Daily  . aspirin EC  81 mg Oral Daily  . cholecalciferol  1,000 Units Oral Daily  . dexamethasone (DECADRON) injection  6 mg Intravenous Q24H  . diltiazem  180 mg Oral Daily  . donepezil  10 mg Oral QHS  . enoxaparin (LOVENOX) injection  40 mg Subcutaneous Q12H  . memantine  10 mg Oral BID  . mirabegron ER  25 mg Oral Daily  . oxybutynin  10 mg Oral Daily  . pantoprazole  40 mg Oral Daily  . rosuvastatin  20 mg Oral Daily  . sertraline  100 mg Oral Daily  . sodium chloride  2 g Oral BID WC  . zinc sulfate  220 mg Oral Daily   Continuous Infusions:  PRN Meds:.acetaminophen, albuterol, guaiFENesin-dextromethorphan, metoprolol tartrate, prochlorperazine  Antibiotics  :    Anti-infectives (From admission, onward)   Start     Dose/Rate Route Frequency Ordered Stop   12/24/19 1000  remdesivir 100 mg in sodium chloride 0.9 % 100 mL IVPB       "Followed by" Linked Group Details   100 mg 200 mL/hr over 30 Minutes Intravenous Daily 12/23/19 0439 12/27/19 1007   12/23/19 0530  remdesivir 200 mg in sodium chloride 0.9% 250 mL IVPB         "Followed by" Linked Group Details   200 mg 580 mL/hr over 30 Minutes Intravenous Once 12/23/19 0439 12/23/19 0630  Phillips Climes M.D on 12/27/2019 at 1:39 PM  To page go to [email or web address]   [facility] -  Office  [phone]      Objective:   Vitals:   12/26/19 0439 12/26/19 2148 12/27/19 0448 12/27/19 0753  BP: (!) 142/77 (!) 137/95 (!) 147/67 (!) 163/98  Pulse: 72 (!) 57 93 78  Resp: (!) 21 18 16 18   Temp: 98.2 F (36.8 C) 97.8 F (36.6 C) 97.7 F (36.5 C) (!) 97.4 F (36.3 C)  TempSrc: Oral Oral Oral Oral  SpO2: 100% (!) 88% 90% 91%  Weight:      Height:        Wt Readings from Last 3 Encounters:  12/23/19 83 kg  10/28/19 84.5 kg  04/23/19 78 kg     Intake/Output Summary (Last 24 hours) at 12/27/2019 1339 Last data filed at 12/27/2019 0900 Gross per 24 hour  Intake 420 ml  Output --  Net 420 ml     Physical Exam  Awake Alert, frail, in no apparent distress Symmetrical Chest wall movement, Good air movement bilaterally, CTAB Irregular irregular, no Gallops,Rubs or new Murmurs, No Parasternal Heave +ve B.Sounds, Abd Soft, No tenderness, No rebound - guarding or rigidity. No Cyanosis, Clubbing or edema, No new Rash or bruise      Data Review:    CBC Recent Labs  Lab 12/23/19 0224 12/24/19 0244 12/25/19 0036 12/26/19 0349 12/27/19 0317  WBC 13.0* 15.4* 15.4* 18.6* 15.3*  HGB 11.5* 10.4* 11.1* 11.5* 11.5*  HCT 35.9* 31.8* 33.9* 34.9* 34.0*  PLT 474* 473* 531* 550* 546*  MCV 93.7 91.9 91.1 91.6 89.9  MCH 30.0 30.1 29.8 30.2 30.4  MCHC 32.0 32.7 32.7 33.0 33.8  RDW 14.0 13.9 13.8 13.9 13.8  LYMPHSABS 2.1 2.1 1.2 1.8 1.2  MONOABS 1.0 1.2* 1.3* 1.6* 0.9  EOSABS 0.1 0.1 0.0 0.0 0.0  BASOSABS 0.0 0.0 0.0 0.0 0.0    Recent Labs  Lab 12/23/19 0224 12/23/19 0224 12/23/19 0956 12/23/19 1047 12/23/19 1540 12/24/19 0244 12/25/19 0036 12/26/19 0349 12/27/19 0317  NA 137   < >  --   --  136 133* 132* 130* 127*  K 2.7*    < >  --   --  3.9 3.4* 4.0 3.9 3.6  CL 96*   < >  --   --  98 98 95* 92* 89*  CO2 28   < >  --   --  27 24 27 27 26   GLUCOSE 175*   < >  --   --  130* 128* 125* 131* 139*  BUN 28*   < >  --   --  23 24* 22 24* 25*  CREATININE 1.14*   < >  --   --  1.00 0.98 0.91 0.97 0.85  CALCIUM 9.1   < >  --   --  8.8* 8.8* 9.1 9.1 8.8*  AST 62*  --   --   --   --  59* 44* 31 30  ALT 48*  --   --   --   --  50* 44 33 30  ALKPHOS 57  --   --   --   --  56 55 55 55  BILITOT 0.8  --   --   --   --  0.8 0.8 0.8 1.0  ALBUMIN 2.4*  --   --   --   --  2.4* 2.4* 2.4* 2.3*  MG  --   --  1.8 1.8  --  1.7  --   --   --   CRP  --   --  19.3*  --   --  15.6* 15.3* 17.8* 17.0*  DDIMER 6.18*  --   --   --   --  6.15* 5.13* 3.33* 5.19*  PROCALCITON <0.10  --   --   --   --   --   --   --   --   LATICACIDVEN 1.7  --   --  1.7  --   --   --   --   --    < > = values in this interval not displayed.    ------------------------------------------------------------------------------------------------------------------ No results for input(s): CHOL, HDL, LDLCALC, TRIG, CHOLHDL, LDLDIRECT in the last 72 hours.  No results found for: HGBA1C ------------------------------------------------------------------------------------------------------------------ No results for input(s): TSH, T4TOTAL, T3FREE, THYROIDAB in the last 72 hours.  Invalid input(s): FREET3  Cardiac Enzymes No results for input(s): CKMB, TROPONINI, MYOGLOBIN in the last 168 hours.  Invalid input(s): CK ------------------------------------------------------------------------------------------------------------------ No results found for: BNP  Micro Results Recent Results (from the past 240 hour(s))  Blood Culture (routine x 2)     Status: None (Preliminary result)   Collection Time: 12/23/19  3:10 AM   Specimen: BLOOD  Result Value Ref Range Status   Specimen Description BLOOD RIGHT ARM  Final   Special Requests   Final    BOTTLES DRAWN AEROBIC  AND ANAEROBIC Blood Culture adequate volume   Culture   Final    NO GROWTH 4 DAYS Performed at [facility], [address], [place], [place] [address]    Report Status PENDING  Incomplete  Blood Culture (routine x 2)     Status: Abnormal   Collection Time: 12/23/19 10:05 AM   Specimen: BLOOD  Result Value Ref Range Status   Specimen Description BLOOD RIGHT ANTECUBITAL  Final   Special Requests   Final    BOTTLES DRAWN AEROBIC AND ANAEROBIC Blood Culture adequate volume   Culture  Setup Time   Final    GRAM POSITIVE COCCI IN CLUSTERS AEROBIC BOTTLE ONLY CRITICAL RESULT CALLED TO, READ BACK BY AND VERIFIED WITH: PHARMD A SULLIVAN [id number] AT 60 AM BY CM    Culture (A)  Final    STAPHYLOCOCCUS HOMINIS THE SIGNIFICANCE OF ISOLATING THIS ORGANISM FROM A SINGLE SET OF BLOOD CULTURES WHEN MULTIPLE SETS ARE DRAWN IS UNCERTAIN. PLEASE NOTIFY THE MICROBIOLOGY DEPARTMENT WITHIN ONE WEEK IF SPECIATION AND SENSITIVITIES ARE REQUIRED. Performed at [facility], [place] [address], [place], [place] [address]    Report Status 12/25/2019 FINAL  Final  Blood Culture ID Panel (Reflexed)     Status: Abnormal   Collection Time: 12/23/19 10:05 AM  Result Value Ref Range Status   Enterococcus faecalis NOT DETECTED NOT DETECTED Final   Enterococcus Faecium NOT DETECTED NOT DETECTED Final   Listeria monocytogenes NOT DETECTED NOT DETECTED Final   Staphylococcus species DETECTED (A) NOT DETECTED Final    Comment: CRITICAL RESULT CALLED TO, READ BACK BY AND VERIFIED WITH: PHARMD A SULLIVAN [id number] AT 737 AM BY CM    Staphylococcus aureus (BCID) NOT DETECTED NOT DETECTED Final   Staphylococcus epidermidis NOT DETECTED NOT DETECTED Final   Staphylococcus lugdunensis NOT DETECTED NOT DETECTED Final   Streptococcus species NOT DETECTED NOT DETECTED Final   Streptococcus agalactiae NOT DETECTED NOT DETECTED Final   Streptococcus pneumoniae NOT DETECTED NOT DETECTED Final   Streptococcus pyogenes NOT  DETECTED  NOT DETECTED Final   A.calcoaceticus-baumannii NOT DETECTED NOT DETECTED Final   Bacteroides fragilis NOT DETECTED NOT DETECTED Final   Enterobacterales NOT DETECTED NOT DETECTED Final   Enterobacter cloacae complex NOT DETECTED NOT DETECTED Final   Escherichia coli NOT DETECTED NOT DETECTED Final   Klebsiella aerogenes NOT DETECTED NOT DETECTED Final   Klebsiella oxytoca NOT DETECTED NOT DETECTED Final   Klebsiella pneumoniae NOT DETECTED NOT DETECTED Final   Proteus species NOT DETECTED NOT DETECTED Final   Salmonella species NOT DETECTED NOT DETECTED Final   Serratia marcescens NOT DETECTED NOT DETECTED Final   Haemophilus influenzae NOT DETECTED NOT DETECTED Final   Neisseria meningitidis NOT DETECTED NOT DETECTED Final   Pseudomonas aeruginosa NOT DETECTED NOT DETECTED Final   Stenotrophomonas maltophilia NOT DETECTED NOT DETECTED Final   Candida albicans NOT DETECTED NOT DETECTED Final   Candida auris NOT DETECTED NOT DETECTED Final   Candida glabrata NOT DETECTED NOT DETECTED Final   Candida krusei NOT DETECTED NOT DETECTED Final   Candida parapsilosis NOT DETECTED NOT DETECTED Final   Candida tropicalis NOT DETECTED NOT DETECTED Final   Cryptococcus neoformans/gattii NOT DETECTED NOT DETECTED Final    Comment: Performed at [facility], [place]. [address] [place], [place] [address]    Radiology Reports CT Head Wo Contrast  Result Date: 12/16/2019 CLINICAL DATA:  Headache COVID positive EXAM: CT HEAD WITHOUT CONTRAST TECHNIQUE: Contiguous axial images were obtained from the base of the skull through the vertex without intravenous contrast. COMPARISON:  None. FINDINGS: Brain: No evidence of acute territorial infarction, hemorrhage, hydrocephalus,extra-axial collection or mass lesion/mass effect. There is dilatation the ventricles and sulci consistent with age-related atrophy. Low-attenuation changes in the deep white matter consistent with small vessel ischemia.  Vascular: No hyperdense vessel or unexpected calcification. Skull: The skull is intact. No fracture or focal lesion identified. Sinuses/Orbits: The visualized paranasal sinuses and mastoid air cells are clear. The orbits and globes intact. Other: None IMPRESSION: No acute intracranial abnormality. Findings consistent with age related atrophy and chronic small vessel ischemia Electronically Signed   By: Prudencio Pair M.D.   On: 12/16/2019 23:29   CT ANGIO CHEST PE W OR WO CONTRAST  Result Date: 12/23/2019 CLINICAL DATA:  Short of breath, COVID positive EXAM: CT ANGIOGRAPHY CHEST WITH CONTRAST TECHNIQUE: Multidetector CT imaging of the chest was performed using the standard protocol during bolus administration of intravenous contrast. Multiplanar CT image reconstructions and MIPs were obtained to evaluate the vascular anatomy. CONTRAST:  17m OMNIPAQUE IOHEXOL 350 MG/ML SOLN COMPARISON:  None. FINDINGS: Cardiovascular: Contrast bolus timing is optimal, but there is significant respiratory motion artifact. Evaluation of the pulmonary arteries is poor beyond proximal segmental level. There is no acute pulmonary embolism identified within this limitation. Enlargement of the main pulmonary artery suggesting pulmonary arterial hypertension. Cardiomegaly with moderate pericardial effusion. Reflux of contrast into the hepatic veins suggests right heart dysfunction. Mediastinum/Nodes: No enlarged lymph nodes identified. Lungs/Pleura: Suboptimal evaluation due to respiratory motion and imaging during expiration. Areas of subsegmental atelectasis. Patchy opacities, which may represent additional atelectasis, edema, or pneumonia. Bronchiectasis at the lung bases. Small bilateral pleural effusions. Pleural fluid extends into the right oblique fissure. Upper Abdomen: No acute abnormality. Musculoskeletal: No acute osseous abnormality. Review of the MIP images confirms the above findings. IMPRESSION: Suboptimal evaluation due to  respiratory motion. No acute pulmonary embolism identified to the proximal segmental level. Cardiomegaly with moderate pericardial effusion. Evidence of pulmonary arterial hypertension. Scattered patchy pulmonary opacities, which may  reflect atelectasis, edema and/or pneumonia. Given above and small bilateral pleural effusions, edema is favored. Electronically Signed   By: Macy Mis M.D.   On: 12/23/2019 08:16   DG Chest Portable 1 View  Result Date: 12/21/2019 CLINICAL DATA:  Shortness of breath.  COVID for a week. EXAM: PORTABLE CHEST 1 VIEW COMPARISON:  12/16/2019. FINDINGS: Enlarged cardiac silhouette. Aortic atherosclerosis. No visible pneumothorax. New bibasilar airspace opacities. Limited evaluation for pleural effusions given bibasilar opacities and AP portable technique. No acute osseous abnormality. IMPRESSION: 1. New bibasilar airspace opacities, concerning for multifocal pneumonia. 2. Cardiomegaly. Electronically Signed   By: Margaretha Sheffield MD   On: 12/21/2019 13:18   DG Chest Port 1 View  Result Date: 12/16/2019 CLINICAL DATA:  86 year old female with positive COVID test. EXAM: PORTABLE CHEST 1 VIEW COMPARISON:  Chest radiograph dated 08/26/2018. FINDINGS: There is cardiomegaly with mild vascular congestion. No focal consolidation, pleural effusion, or pneumothorax. Minimal bibasilar densities, likely atelectasis. Atherosclerotic calcification of the aorta. No acute osseous pathology. IMPRESSION: Cardiomegaly with mild vascular congestion. No focal consolidation. Electronically Signed   By: Anner Crete M.D.   On: 12/16/2019 21:33   VAS [place] LOWER EXTREMITY VENOUS (DVT)  Result Date: 12/24/2019  Lower Venous DVT Study Indications: Elevated ddimer.  Comparison Study: no prior Performing Technologist: Abram Sander RVS  Examination Guidelines: A complete evaluation includes B-mode imaging, spectral Doppler, color Doppler, and power Doppler as needed of all accessible portions of each  vessel. Bilateral testing is considered an integral part of a complete examination. Limited examinations for reoccurring indications may be performed as noted. The reflux portion of the exam is performed with the patient in reverse Trendelenburg.  +---------+---------------+---------+-----------+----------+--------------+ RIGHT    CompressibilityPhasicitySpontaneityPropertiesThrombus Aging +---------+---------------+---------+-----------+----------+--------------+ CFV      Full           Yes      Yes                                 +---------+---------------+---------+-----------+----------+--------------+ SFJ      Full                                                        +---------+---------------+---------+-----------+----------+--------------+ FV Prox  Full                                                        +---------+---------------+---------+-----------+----------+--------------+ FV Mid   Full                                                        +---------+---------------+---------+-----------+----------+--------------+ FV DistalFull                                                        +---------+---------------+---------+-----------+----------+--------------+ PFV  Full                                                        +---------+---------------+---------+-----------+----------+--------------+ POP      Full           Yes      Yes                                 +---------+---------------+---------+-----------+----------+--------------+ PTV      Full                                                        +---------+---------------+---------+-----------+----------+--------------+ PERO     Full                                                        +---------+---------------+---------+-----------+----------+--------------+   +---------+---------------+---------+-----------+----------+-------------------+ LEFT      CompressibilityPhasicitySpontaneityPropertiesThrombus Aging      +---------+---------------+---------+-----------+----------+-------------------+ CFV      Full           Yes      Yes                                      +---------+---------------+---------+-----------+----------+-------------------+ SFJ      Full                                                             +---------+---------------+---------+-----------+----------+-------------------+ FV Prox  Full                                                             +---------+---------------+---------+-----------+----------+-------------------+ FV Mid                  Yes      Yes                                      +---------+---------------+---------+-----------+----------+-------------------+ FV Distal               Yes      Yes                                      +---------+---------------+---------+-----------+----------+-------------------+ PFV      Full                                                             +---------+---------------+---------+-----------+----------+-------------------+  POP      Full           Yes      Yes                                      +---------+---------------+---------+-----------+----------+-------------------+ PTV      Full                                                             +---------+---------------+---------+-----------+----------+-------------------+ PERO                                                  Not well visualized +---------+---------------+---------+-----------+----------+-------------------+     Summary: BILATERAL: - No evidence of deep vein thrombosis seen in the lower extremities, bilaterally. - No evidence of superficial venous thrombosis in the lower extremities, bilaterally. -No evidence of popliteal cyst, bilaterally.   *See table(s) above for measurements and observations. Electronically signed by Jamelle Haring on  12/24/2019 at 7:06:20 PM.    Final

## 2019-12-27 NOTE — Progress Notes (Signed)
Received message from Dr. [facility] to reduce pt. Fluid intake as NA 127

## 2019-12-27 NOTE — TOC Progression Note (Addendum)
Transition of Care [facility]) - Progression Note    Patient Details  Name: [name] [name] MRN: [id number] Date of Birth: 08/01/1928  Transition [facility]) CM/SW Contact  Elliot Gurney [place], [place] Phone Number: [phone] 12/27/2019, 9:27 AM  Clinical Narrative:    Patient now  agreeable to SNF. Patient's PASRR requested and now needs a level II review. Patient's clinicals faxed to [facility] MUST for review at [phone].  Phone call to [place] at [facility]. Patient as been accepted and can go tomorrow morning.   Marieke Lubke [address] LCSW Transition of Care [phone]    Expected Discharge Plan: Home/Self Care Barriers to Discharge: Continued Medical Work up  Expected Discharge Plan and Services Expected Discharge Plan: Home/Self Care   Discharge Planning Services: CM Consult   Living arrangements for the past 2 months: Single Family Home                                       Social Determinants of Health (SDOH) Interventions    Readmission Risk Interventions No flowsheet data found.

## 2019-12-28 LAB — CBC WITH DIFFERENTIAL/PLATELET
Abs Immature Granulocytes: 0.08 10*3/uL — ABNORMAL HIGH (ref 0.00–0.07)
Basophils Absolute: 0 10*3/uL (ref 0.0–0.1)
Basophils Relative: 0 %
Eosinophils Absolute: 0 10*3/uL (ref 0.0–0.5)
Eosinophils Relative: 0 %
HCT: 31.2 % — ABNORMAL LOW (ref 36.0–46.0)
Hemoglobin: 10.6 g/dL — ABNORMAL LOW (ref 12.0–15.0)
Immature Granulocytes: 1 %
Lymphocytes Relative: 8 %
Lymphs Abs: 1 10*3/uL (ref 0.7–4.0)
MCH: 30.4 pg (ref 26.0–34.0)
MCHC: 34 g/dL (ref 30.0–36.0)
MCV: 89.4 fL (ref 80.0–100.0)
Monocytes Absolute: 1 10*3/uL (ref 0.1–1.0)
Monocytes Relative: 8 %
Neutro Abs: 10.8 10*3/uL — ABNORMAL HIGH (ref 1.7–7.7)
Neutrophils Relative %: 83 %
Platelets: 498 10*3/uL — ABNORMAL HIGH (ref 150–400)
RBC: 3.49 MIL/uL — ABNORMAL LOW (ref 3.87–5.11)
RDW: 13.8 % (ref 11.5–15.5)
WBC: 12.9 10*3/uL — ABNORMAL HIGH (ref 4.0–10.5)
nRBC: 0 % (ref 0.0–0.2)

## 2019-12-28 LAB — CULTURE, BLOOD (ROUTINE X 2)
Culture: NO GROWTH
Special Requests: ADEQUATE

## 2019-12-28 LAB — COMPREHENSIVE METABOLIC PANEL
ALT: 27 U/L (ref 0–44)
AST: 29 U/L (ref 15–41)
Albumin: 2.2 g/dL — ABNORMAL LOW (ref 3.5–5.0)
Alkaline Phosphatase: 52 U/L (ref 38–126)
Anion gap: 8 (ref 5–15)
BUN: 27 mg/dL — ABNORMAL HIGH (ref 8–23)
CO2: 26 mmol/L (ref 22–32)
Calcium: 8.5 mg/dL — ABNORMAL LOW (ref 8.9–10.3)
Chloride: 93 mmol/L — ABNORMAL LOW (ref 98–111)
Creatinine, Ser: 0.91 mg/dL (ref 0.44–1.00)
GFR, Estimated: >60 mL/min (ref >60)
Glucose, Bld: 142 mg/dL — ABNORMAL HIGH (ref 70–99)
Potassium: 4.2 mmol/L (ref 3.5–5.1)
Sodium: 127 mmol/L — ABNORMAL LOW (ref 135–145)
Total Bilirubin: 0.9 mg/dL (ref 0.3–1.2)
Total Protein: 6.3 g/dL — ABNORMAL LOW (ref 6.5–8.1)

## 2019-12-28 LAB — D-DIMER, QUANTITATIVE: D-Dimer, Quant: 5.96 ug/mL-FEU — ABNORMAL HIGH (ref 0.00–0.50)

## 2019-12-28 LAB — C-REACTIVE PROTEIN: CRP: 15.6 mg/dL — ABNORMAL HIGH (ref <1.0)

## 2019-12-28 MED ORDER — FUROSEMIDE 10 MG/ML IJ SOLN: 20.0000 mg | INTRAMUSCULAR | Status: DC

## 2019-12-28 MED ORDER — FUROSEMIDE 10 MG/ML IJ SOLN
20.0000 mg | Freq: Once | INTRAMUSCULAR | Status: AC
  Administered 2019-12-28: 20 mg via INTRAVENOUS
  Filled 2019-12-28: qty 2

## 2019-12-28 NOTE — TOC Progression Note (Addendum)
Transition of Care [facility]) - Progression Note    Patient Details  Name: Lilliauna Van MRN: [id number] Date of Birth: Jul 10, 1928  Transition of Care [facility]) CM/SW [place], LCSW Phone Number: 12/28/2019, 3:36 PM  Clinical Narrative:    CSW provided update to patient's daughter.   Expected Discharge Plan: Skilled Nursing Facility Barriers to Discharge: [place] (PASRR), Insurance Authorization, Continued Medical Work up  Expected Discharge Plan and Services Expected Discharge Plan: [place] In-house Referral: Clinical Social Work Discharge Planning Services: CM Consult Post Acute Care Choice: [place] arrangements for the past 2 months: Single Family Home                                       Social Determinants of Health (SDOH) Interventions    Readmission Risk Interventions No flowsheet data found.

## 2019-12-28 NOTE — Progress Notes (Signed)
PROGRESS NOTE                                                                             PROGRESS NOTE                                                                                                                                                                                                             Patient Demographics:    [name] [name], is a 86 y.o. female, DOB - [birth year], TIR:[id number]  Outpatient Primary MD for the patient is Merrilee Seashore, MD    LOS - 5  Admit date - 12/22/2019    Chief Complaint  Patient presents with  . Cough       Brief Narrative    HPI: [name] [name] is a 86 y.o. female with medical history significant of paroxysmal A. fib not on anticoagulation, aortic stenosis, hypertension, hyperlipidemia, rheumatoid arthritis, dementia, depression presenting the ED with with a chief complaint of cough.  Patient recently tested positive for COVID-19 during ED visit on 12/16/2019.  She did not have any hypoxemia at that time and was discharged.  Subsequently received outpatient bamlanivimab/etesevimab infusion on 12/18/2019.  Patient was then sent to the ED by her PCP on 12/21/2019 for evaluation of low oxygen saturation of 71% on room air.  She was satting 97% on room air at triage with no signs of respiratory distress.  Chest x-ray revealed new bibasilar airspace opacities concerning for multifocal pneumonia.  Patient left the ED before being evaluated by a physician.    Patient states she was sent to the ED by her doctor who told her she had pneumonia.  She has no complaints other than a cough.  Denies fevers, chills, body aches, fatigue, shortness of breath, chest pain, nausea, vomiting, abdominal pain, diarrhea, or dysuria.  No additional history could be obtained from her.  ED Course: Afebrile.  Not hypoxic at rest but sats dropped to 70s on room air with ambulation.  WBC 13.0, hemoglobin 11.5 (at baseline), platelet 474k.   Sodium  137, potassium 2.7, chloride 96, bicarb 28, BUN 28, creatinine 1.1, glucose 175.  AST 62, ALT 48.  Alk phos and T bili normal.  Lactic acid level normal.  Procalcitonin <0.10.  Inflammatory markers elevated: D-dimer 6.18, LDH 279, fibrinogen 798.  Ferritin and CRP levels pending.  Blood culture x2 pending.  Patient received IV Decadron 6 mg and remdesivir.     Subjective:    Vaishali Baise today denies any headache, chest pain or shortness of breath, reports she is following fluid restrictions.     Assessment  & Plan :    Principal Problem:   Pneumonia due to COVID-19 virus Active Problems:   Atrial fibrillation ([place])   Acute hypoxemic respiratory failure (H[facility])   Hypokalemia   Transaminitis   COVID-19 viral pneumonia  -She is vaccinated with Mederma in April . -imaging significant for bilateral opacities. -Continue with steroids and Remdesivir. -D Dimer is elevated, CTA chest with no evidence of PE, will check venous Dopplers, meanwhile keep on full dose anticoagulation and continue to trend D-dimers. -Continue to trend inflammatory markers, CRP and D-dimers trending down, but they are significantly elevated, for which we will continue with Decadron for now.  -She was encouraged to use incentive spirometry, flutter valve, out of bed to chair. -Received outpatient bamlanivimab/etesevimab infusion on 12/18/2019 -She is with elevated D-dimers, CTA with no PE, venous Dopplers with no DVT, will keep on 0.5 mg/kg every 12 hours till here D-dimer is less than 2.   COVID-19 Labs  Recent Labs    12/26/19 0349 12/27/19 0317 12/28/19 0350  DDIMER 3.33* 5.19* 5.96*  CRP 17.8* 17.0* 15.6*    Lab Results  Component Value Date   SARSCOV2NAA POSITIVE (A) 12/16/2019   [place] NEGATIVE 08/26/2018     Hypokalemia -Repleted,   1/4 blood culture with staph hominis, -Most likely contamination, no indication to treat, procalcitonin within normal limit.  Mild  transaminitis -Late due to Covid infection, trending down.  Hyponatremia -Sodium dropped to 127 despite holding her hydrochlorothiazide, this has stabilized over last 24 hours with fluid restriction, I will give low-dose of Lasix, this is most likely in the setting of SIADH, will repeat BMP this afternoon.    Paroxysmal A. Fib CHA2DS2VASc at least 4.  Not on chronic anticoagulation as she only had one single episode during hospital stay in 2020 with no recurrence .   -Keep K >4, and magnesium > 2.. -I have discussed with cardiology Dr. Burt Knack, patient is extremely high risk for falls, so frequent over the last year, with one fall where she remained on the floor for few days, risks outweighed benefits, no full dose anticoagulation, continue with baby aspirin.  Have discussed this and details with patient's daughter, she is agreeable with plan. -Blood pressure and heart rate currently acceptable on Cardizem CD 240 mg oral daily .  Hypertension -DC hydrochlorothiazide because of hyponatremia, blood pressure controlled on current dose of Cardizem CD 240 mg oral daily.  Hyperlipidemia -Resume statin   Dementia -Resume home meds   Depression -Resume home meds  SpO2: 100 %  Recent Labs  Lab 12/23/19 0224 12/23/19 0224 12/23/19 0956 12/23/19 1047 12/24/19 0244 12/25/19 0036 12/26/19 0349 12/27/19 0317 12/28/19 0350  WBC 13.0*   < >  --   --  15.4* 15.4* 18.6* 15.3* 12.9*  PLT 474*   < >  --   --  473* 531* 550* 546* 498*  CRP  --    < >   < >  --  15.6* 15.3* 17.8* 17.0* 15.6*  DDIMER 6.18*   < >  --   --  6.15* 5.13* 3.33* 5.19* 5.96*  PROCALCITON <0.10  --   --   --   --   --   --   --   --   AST 62*   < >  --   --  59* 44* '31 30 29  ' ALT 48*   < >  --   --  50* 44 33 30 27  ALKPHOS 57   < >  --   --  56 55 55 55 52  BILITOT 0.8   < >  --   --  0.8 0.8 0.8 1.0 0.9  ALBUMIN 2.4*   < >  --   --  2.4* 2.4* 2.4* 2.3* 2.2*  LATICACIDVEN 1.7  --   --  1.7  --   --   --   --   --     < > = values in this interval not displayed.       ABG  No results found for: PHART, PCO2ART, PO2ART, HCO3, TCO2, ACIDBASEDEF, O2SAT         Condition - Extremely Guarded  Family communication: D/W daughter dianne 11/6  Code Status :  Full  Consults  :  none  Procedures  :  none  PUD Prophylaxis : protonix  Disposition Plan  :    Status is: Inpatient  Remains inpatient appropriate because:Persistent severe electrolyte disturbances and IV treatments appropriate due to intensity of illness or inability to take PO   Dispo: The patient is from: Home              Anticipated d/c is to: SNF              Anticipated d/c date is: 1 day              Patient currently is medically stable to d/c. when SNF bed is available.  If her hyponatremia continues to improve.      DVT Prophylaxis  : Full dose Lovenox  Lab Results  Component Value Date   PLT 498 (H) 12/28/2019    Diet :  Diet Order            Diet Heart Room service appropriate? Yes; Fluid consistency: Thin  Diet effective now                  Inpatient Medications  Scheduled Meds: . vitamin C  500 mg Oral Daily  . aspirin EC  81 mg Oral Daily  . cholecalciferol  1,000 Units Oral Daily  . dexamethasone (DECADRON) injection  6 mg Intravenous Q24H  . diltiazem  240 mg Oral Daily  . donepezil  10 mg Oral QHS  . enoxaparin (LOVENOX) injection  40 mg Subcutaneous Q12H  . memantine  10 mg Oral BID  . mirabegron ER  25 mg Oral Daily  . oxybutynin  10 mg Oral Daily  . pantoprazole  40 mg Oral Daily  . rosuvastatin  20 mg Oral Daily  . sertraline  100 mg Oral Daily  . zinc sulfate  220 mg Oral Daily   Continuous Infusions:  PRN Meds:.acetaminophen, albuterol, guaiFENesin-dextromethorphan, metoprolol tartrate, prochlorperazine  Antibiotics  :    Anti-infectives (From admission, onward)   Start     Dose/Rate Route Frequency Ordered Stop   12/24/19 1000  remdesivir 100 mg in sodium chloride 0.9 %  100 mL IVPB       "  Followed by" Linked Group Details   100 mg 200 mL/hr over 30 Minutes Intravenous Daily 12/23/19 0439 12/27/19 1007   12/23/19 0530  remdesivir 200 mg in sodium chloride 0.9% 250 mL IVPB       "Followed by" Linked Group Details   200 mg 580 mL/hr over 30 Minutes Intravenous Once 12/23/19 0439 12/23/19 0630        Analiza Cowger M.D on 12/28/2019 at 2:00 PM  To page go to [email or web address]   [facility] -  Office  [phone]      Objective:   Vitals:   12/27/19 0753 12/27/19 1700 12/27/19 2042 12/28/19 0548  BP: (!) 163/98 (!) 164/87 130/80 (!) 141/80  Pulse: 78 (!) 59 77 80  Resp: '18 19 17 16  ' Temp: (!) 97.4 F (36.3 C) 98.3 F (36.8 C) 98 F (36.7 C) 98.6 F (37 C)  TempSrc: Oral Oral  Oral  SpO2: 91% 94% 100% 100%  Weight:      Height:        Wt Readings from Last 3 Encounters:  12/23/19 83 kg  10/28/19 84.5 kg  04/23/19 78 kg     Intake/Output Summary (Last 24 hours) at 12/28/2019 1400 Last data filed at 12/28/2019 1100 Gross per 24 hour  Intake 260 ml  Output --  Net 260 ml     Physical Exam  Awake Alert, frail, sitting in bed in no apparent distress. Symmetrical Chest wall movement, Good air movement bilaterally, CTAB Irregular irregular,No Gallops,Rubs or new Murmurs, No Parasternal Heave +ve B.Sounds, Abd Soft, No tenderness, No rebound - guarding or rigidity. No Cyanosis, Clubbing or edema, No new Rash or bruise      Data Review:    CBC Recent Labs  Lab 12/24/19 0244 12/25/19 0036 12/26/19 0349 12/27/19 0317 12/28/19 0350  WBC 15.4* 15.4* 18.6* 15.3* 12.9*  HGB 10.4* 11.1* 11.5* 11.5* 10.6*  HCT 31.8* 33.9* 34.9* 34.0* 31.2*  PLT 473* 531* 550* 546* 498*  MCV 91.9 91.1 91.6 89.9 89.4  MCH 30.1 29.8 30.2 30.4 30.4  MCHC 32.7 32.7 33.0 33.8 34.0  RDW 13.9 13.8 13.9 13.8 13.8  LYMPHSABS 2.1 1.2 1.8 1.2 1.0  MONOABS 1.2* 1.3* 1.6* 0.9 1.0  EOSABS 0.1 0.0 0.0 0.0 0.0  BASOSABS 0.0 0.0 0.0 0.0 0.0     Recent Labs  Lab  0000 12/23/19 0224 12/23/19 0956 12/23/19 1047 12/23/19 1540 12/24/19 0244 12/25/19 0036 12/26/19 0349 12/27/19 0317 12/28/19 0350  NA  --  137  --   --    < > 133* 132* 130* 127* 127*  K  --  2.7*  --   --    < > 3.4* 4.0 3.9 3.6 4.2  CL  --  96*  --   --    < > 98 95* 92* 89* 93*  CO2  --  28  --   --    < > '24 27 27 26 26  ' GLUCOSE  --  175*  --   --    < > 128* 125* 131* 139* 142*  BUN  --  28*  --   --    < > 24* 22 24* 25* 27*  CREATININE  --  1.14*  --   --    < > 0.98 0.91 0.97 0.85 0.91  CALCIUM  --  9.1  --   --    < > 8.8* 9.1 9.1 8.8* 8.5*  AST   < > 62*  --   --   --  59* 44* '31 30 29  ' ALT   < > 48*  --   --   --  50* 44 33 30 27  ALKPHOS   < > 57  --   --   --  56 55 55 55 52  BILITOT   < > 0.8  --   --   --  0.8 0.8 0.8 1.0 0.9  ALBUMIN   < > 2.4*  --   --   --  2.4* 2.4* 2.4* 2.3* 2.2*  MG  --   --  1.8 1.8  --  1.7  --   --   --   --   CRP   < >  --  19.3*  --   --  15.6* 15.3* 17.8* 17.0* 15.6*  DDIMER   < > 6.18*  --   --   --  6.15* 5.13* 3.33* 5.19* 5.96*  PROCALCITON  --  <0.10  --   --   --   --   --   --   --   --   LATICACIDVEN  --  1.7  --  1.7  --   --   --   --   --   --    < > = values in this interval not displayed.    ------------------------------------------------------------------------------------------------------------------ No results for input(s): CHOL, HDL, LDLCALC, TRIG, CHOLHDL, LDLDIRECT in the last 72 hours.  No results found for: HGBA1C ------------------------------------------------------------------------------------------------------------------ No results for input(s): TSH, T4TOTAL, T3FREE, THYROIDAB in the last 72 hours.  Invalid input(s): FREET3  Cardiac Enzymes No results for input(s): CKMB, TROPONINI, MYOGLOBIN in the last 168 hours.  Invalid input(s): CK ------------------------------------------------------------------------------------------------------------------ No results found for:  BNP  Micro Results Recent Results (from the past 240 hour(s))  Blood Culture (routine x 2)     Status: None   Collection Time: 12/23/19  3:10 AM   Specimen: BLOOD  Result Value Ref Range Status   Specimen Description BLOOD RIGHT ARM  Final   Special Requests   Final    BOTTLES DRAWN AEROBIC AND ANAEROBIC Blood Culture adequate volume   Culture   Final    NO GROWTH 5 DAYS Performed at [facility], [address], [place], [place] [address]    Report Status 12/28/2019 FINAL  Final  Blood Culture (routine x 2)     Status: Abnormal   Collection Time: 12/23/19 10:05 AM   Specimen: BLOOD  Result Value Ref Range Status   Specimen Description BLOOD RIGHT ANTECUBITAL  Final   Special Requests   Final    BOTTLES DRAWN AEROBIC AND ANAEROBIC Blood Culture adequate volume   Culture  Setup Time   Final    GRAM POSITIVE COCCI IN CLUSTERS AEROBIC BOTTLE ONLY CRITICAL RESULT CALLED TO, READ BACK BY AND VERIFIED WITH: PHARMD A SULLIVAN [id number] AT 76 AM BY CM    Culture (A)  Final    STAPHYLOCOCCUS HOMINIS THE SIGNIFICANCE OF ISOLATING THIS ORGANISM FROM A SINGLE SET OF BLOOD CULTURES WHEN MULTIPLE SETS ARE DRAWN IS UNCERTAIN. PLEASE NOTIFY THE MICROBIOLOGY DEPARTMENT WITHIN ONE WEEK IF SPECIATION AND SENSITIVITIES ARE REQUIRED. Performed at [facility], [place] [address] [place],  [address]    Report Status 12/25/2019 FINAL  Final  Blood Culture ID Panel (Reflexed)     Status: Abnormal   Collection Time: 12/23/19 10:05 AM  Result Value Ref Range Status   Enterococcus faecalis NOT DETECTED NOT DETECTED Final   Enterococcus Faecium  NOT DETECTED NOT DETECTED Final   Listeria monocytogenes NOT DETECTED NOT DETECTED Final   Staphylococcus species DETECTED (A) NOT DETECTED Final    Comment: CRITICAL RESULT CALLED TO, READ BACK BY AND VERIFIED WITH: PHARMD A SULLIVAN [id number] AT 737 AM BY CM    Staphylococcus aureus (BCID) NOT DETECTED NOT DETECTED Final   Staphylococcus  epidermidis NOT DETECTED NOT DETECTED Final   Staphylococcus lugdunensis NOT DETECTED NOT DETECTED Final   Streptococcus species NOT DETECTED NOT DETECTED Final   Streptococcus agalactiae NOT DETECTED NOT DETECTED Final   Streptococcus pneumoniae NOT DETECTED NOT DETECTED Final   Streptococcus pyogenes NOT DETECTED NOT DETECTED Final   A.calcoaceticus-baumannii NOT DETECTED NOT DETECTED Final   Bacteroides fragilis NOT DETECTED NOT DETECTED Final   Enterobacterales NOT DETECTED NOT DETECTED Final   Enterobacter cloacae complex NOT DETECTED NOT DETECTED Final   Escherichia coli NOT DETECTED NOT DETECTED Final   Klebsiella aerogenes NOT DETECTED NOT DETECTED Final   Klebsiella oxytoca NOT DETECTED NOT DETECTED Final   Klebsiella pneumoniae NOT DETECTED NOT DETECTED Final   Proteus species NOT DETECTED NOT DETECTED Final   Salmonella species NOT DETECTED NOT DETECTED Final   Serratia marcescens NOT DETECTED NOT DETECTED Final   Haemophilus influenzae NOT DETECTED NOT DETECTED Final   Neisseria meningitidis NOT DETECTED NOT DETECTED Final   Pseudomonas aeruginosa NOT DETECTED NOT DETECTED Final   Stenotrophomonas maltophilia NOT DETECTED NOT DETECTED Final   Candida albicans NOT DETECTED NOT DETECTED Final   Candida auris NOT DETECTED NOT DETECTED Final   Candida glabrata NOT DETECTED NOT DETECTED Final   Candida krusei NOT DETECTED NOT DETECTED Final   Candida parapsilosis NOT DETECTED NOT DETECTED Final   Candida tropicalis NOT DETECTED NOT DETECTED Final   Cryptococcus neoformans/gattii NOT DETECTED NOT DETECTED Final    Comment: Performed at [facility], [address], [place], [place] [address]    Radiology Reports CT Head Wo Contrast  Result Date: 12/16/2019 CLINICAL DATA:  Headache COVID positive EXAM: CT HEAD WITHOUT CONTRAST TECHNIQUE: Contiguous axial images were obtained from the base of the skull through the vertex without intravenous contrast. COMPARISON:  None.  FINDINGS: Brain: No evidence of acute territorial infarction, hemorrhage, hydrocephalus,extra-axial collection or mass lesion/mass effect. There is dilatation the ventricles and sulci consistent with age-related atrophy. Low-attenuation changes in the deep white matter consistent with small vessel ischemia. Vascular: No hyperdense vessel or unexpected calcification. Skull: The skull is intact. No fracture or focal lesion identified. Sinuses/Orbits: The visualized paranasal sinuses and mastoid air cells are clear. The orbits and globes intact. Other: None IMPRESSION: No acute intracranial abnormality. Findings consistent with age related atrophy and chronic small vessel ischemia Electronically Signed   By: Prudencio Pair M.D.   On: 12/16/2019 23:29   CT ANGIO CHEST PE W OR WO CONTRAST  Result Date: 12/23/2019 CLINICAL DATA:  Short of breath, COVID positive EXAM: CT ANGIOGRAPHY CHEST WITH CONTRAST TECHNIQUE: Multidetector CT imaging of the chest was performed using the standard protocol during bolus administration of intravenous contrast. Multiplanar CT image reconstructions and MIPs were obtained to evaluate the vascular anatomy. CONTRAST:  76m OMNIPAQUE IOHEXOL 350 MG/ML SOLN COMPARISON:  None. FINDINGS: Cardiovascular: Contrast bolus timing is optimal, but there is significant respiratory motion artifact. Evaluation of the pulmonary arteries is poor beyond proximal segmental level. There is no acute pulmonary embolism identified within this limitation. Enlargement of the main pulmonary artery suggesting pulmonary arterial hypertension. Cardiomegaly with moderate pericardial effusion. Reflux of contrast  into the hepatic veins suggests right heart dysfunction. Mediastinum/Nodes: No enlarged lymph nodes identified. Lungs/Pleura: Suboptimal evaluation due to respiratory motion and imaging during expiration. Areas of subsegmental atelectasis. Patchy opacities, which may represent additional atelectasis, edema, or  pneumonia. Bronchiectasis at the lung bases. Small bilateral pleural effusions. Pleural fluid extends into the right oblique fissure. Upper Abdomen: No acute abnormality. Musculoskeletal: No acute osseous abnormality. Review of the MIP images confirms the above findings. IMPRESSION: Suboptimal evaluation due to respiratory motion. No acute pulmonary embolism identified to the proximal segmental level. Cardiomegaly with moderate pericardial effusion. Evidence of pulmonary arterial hypertension. Scattered patchy pulmonary opacities, which may reflect atelectasis, edema and/or pneumonia. Given above and small bilateral pleural effusions, edema is favored. Electronically Signed   By: Macy Mis M.D.   On: 12/23/2019 08:16   DG Chest Portable 1 View  Result Date: 12/21/2019 CLINICAL DATA:  Shortness of breath.  COVID for a week. EXAM: PORTABLE CHEST 1 VIEW COMPARISON:  12/16/2019. FINDINGS: Enlarged cardiac silhouette. Aortic atherosclerosis. No visible pneumothorax. New bibasilar airspace opacities. Limited evaluation for pleural effusions given bibasilar opacities and AP portable technique. No acute osseous abnormality. IMPRESSION: 1. New bibasilar airspace opacities, concerning for multifocal pneumonia. 2. Cardiomegaly. Electronically Signed   By: Margaretha Sheffield MD   On: 12/21/2019 13:18   DG Chest Port 1 View  Result Date: 12/16/2019 CLINICAL DATA:  86 year old female with positive COVID test. EXAM: PORTABLE CHEST 1 VIEW COMPARISON:  Chest radiograph dated 08/26/2018. FINDINGS: There is cardiomegaly with mild vascular congestion. No focal consolidation, pleural effusion, or pneumothorax. Minimal bibasilar densities, likely atelectasis. Atherosclerotic calcification of the aorta. No acute osseous pathology. IMPRESSION: Cardiomegaly with mild vascular congestion. No focal consolidation. Electronically Signed   By: Anner Crete M.D.   On: 12/16/2019 21:33   VAS [place] LOWER EXTREMITY VENOUS  (DVT)  Result Date: 12/24/2019  Lower Venous DVT Study Indications: Elevated ddimer.  Comparison Study: no prior Performing Technologist: Abram Sander RVS  Examination Guidelines: A complete evaluation includes B-mode imaging, spectral Doppler, color Doppler, and power Doppler as needed of all accessible portions of each vessel. Bilateral testing is considered an integral part of a complete examination. Limited examinations for reoccurring indications may be performed as noted. The reflux portion of the exam is performed with the patient in reverse Trendelenburg.  +---------+---------------+---------+-----------+----------+--------------+ RIGHT    CompressibilityPhasicitySpontaneityPropertiesThrombus Aging +---------+---------------+---------+-----------+----------+--------------+ CFV      Full           Yes      Yes                                 +---------+---------------+---------+-----------+----------+--------------+ SFJ      Full                                                        +---------+---------------+---------+-----------+----------+--------------+ FV Prox  Full                                                        +---------+---------------+---------+-----------+----------+--------------+ FV Mid   Full                                                        +---------+---------------+---------+-----------+----------+--------------+  FV DistalFull                                                        +---------+---------------+---------+-----------+----------+--------------+ PFV      Full                                                        +---------+---------------+---------+-----------+----------+--------------+ POP      Full           Yes      Yes                                 +---------+---------------+---------+-----------+----------+--------------+ PTV      Full                                                         +---------+---------------+---------+-----------+----------+--------------+ PERO     Full                                                        +---------+---------------+---------+-----------+----------+--------------+   +---------+---------------+---------+-----------+----------+-------------------+ LEFT     CompressibilityPhasicitySpontaneityPropertiesThrombus Aging      +---------+---------------+---------+-----------+----------+-------------------+ CFV      Full           Yes      Yes                                      +---------+---------------+---------+-----------+----------+-------------------+ SFJ      Full                                                             +---------+---------------+---------+-----------+----------+-------------------+ FV Prox  Full                                                             +---------+---------------+---------+-----------+----------+-------------------+ FV Mid                  Yes      Yes                                      +---------+---------------+---------+-----------+----------+-------------------+ FV Distal               Yes  Yes                                      +---------+---------------+---------+-----------+----------+-------------------+ PFV      Full                                                             +---------+---------------+---------+-----------+----------+-------------------+ POP      Full           Yes      Yes                                      +---------+---------------+---------+-----------+----------+-------------------+ PTV      Full                                                             +---------+---------------+---------+-----------+----------+-------------------+ PERO                                                  Not well visualized +---------+---------------+---------+-----------+----------+-------------------+     Summary:  BILATERAL: - No evidence of deep vein thrombosis seen in the lower extremities, bilaterally. - No evidence of superficial venous thrombosis in the lower extremities, bilaterally. -No evidence of popliteal cyst, bilaterally.   *See table(s) above for measurements and observations. Electronically signed by Jamelle Haring on 12/24/2019 at 7:06:20 PM.    Final

## 2019-12-28 NOTE — Progress Notes (Signed)
Physical Therapy Treatment Patient Details Name: [name] [name] MRN: [id number] DOB: 1929-01-24 Today's Date: 12/28/2019    History of Present Illness 86 yo female presenting to ED per PCP on 12/21/19 with cough and hypoxia. Pt tested positive for COVID-19 during ED visit on 12/16/19 and dicharged. Chest x-ray revealed new bibasilar airspace opacities concerning for multifocal pneumonia. PMH including paroxysmal A. fib not on anticoagulation, aortic stenosis, hypertension, hyperlipidemia, rheumatoid arthritis, dementia, and depression.    PT Comments    Patient with slightly delayed verbal and motor responses throughout session. Very difficult to get an accurate sat reading due to significantly cold hands and earlobes. With warming of her rt hand, ultimately able to get a good pleth/waveform with pt saturating 100% at rest on 1L and 97% on room air while walking. RN not available to discuss ?OK to leave patient on room air and therefore resumed 1L [place] O2. Noted family has told TOC they cannot provide level of care that patient currently requires.    Follow Up Recommendations  SNF     Equipment Recommendations  Other (comment) (defer to next venue)    Recommendations for Other Services       Precautions / Restrictions Precautions Precautions: Fall    Mobility  Bed Mobility                  Transfers Overall transfer level: Needs assistance Equipment used: Rolling walker (2 wheeled) Transfers: Sit to/from Stand Sit to Stand: Min guard         General transfer comment: Min guard A for safety due to incr pt effort, cues for hand placement  Ambulation/Gait Ambulation/Gait assistance: Min guard Gait Distance (Feet): 40 Feet Assistive device: Rolling walker (2 wheeled) Gait Pattern/deviations: Step-through pattern;Trunk flexed;Narrow base of support;Decreased stride length Gait velocity: decreased due to fatigue   General Gait Details: easily fatigued but safe and steady  with RW   Stairs             Wheelchair Mobility    Modified Rankin (Stroke Patients Only)       Balance Overall balance assessment: Needs assistance Sitting-balance support: No upper extremity supported;Feet supported Sitting balance-Leahy Scale: Fair     Standing balance support: During functional activity;No upper extremity supported Standing balance-Leahy Scale: Poor Standing balance comment: Reliant on UE support                            Cognition Arousal/Alertness: Awake/alert Behavior During Therapy: WFL for tasks assessed/performed;Flat affect Overall Cognitive Status: History of cognitive impairments - at baseline                                 General Comments: flat but pleasant, followed commands well      Exercises      General Comments General comments (skin integrity, edema, etc.): Incr time trying to get an accurate sat reading (pt's fingers and earlobes very cold). Ultimately able to hold/warm patient's rt hand up enough to get a reading.       Pertinent Vitals/Pain Pain Assessment: No/denies pain    Home Living                      Prior Function            PT Goals (current goals can now be found in the care plan section) Acute Rehab  PT Goals Patient Stated Goal: Go home Time For Goal Achievement: 01/09/20 Potential to Achieve Goals: Fair Progress towards PT goals: Progressing toward goals    Frequency    Min 2X/week      PT Plan Frequency needs to be updated    Co-evaluation              AM-PAC PT "6 Clicks" Mobility   Outcome Measure  Help needed turning from your back to your side while in a flat bed without using bedrails?: A Little Help needed moving from lying on your back to sitting on the side of a flat bed without using bedrails?: A Little Help needed moving to and from a bed to a chair (including a wheelchair)?: A Little Help needed standing up from a chair using your  arms (e.g., wheelchair or bedside chair)?: A Little Help needed to walk in hospital room?: A Little Help needed climbing 3-5 steps with a railing? : A Lot 6 Click Score: 17    End of Session Equipment Utilized During Treatment: Gait belt Activity Tolerance: Patient limited by fatigue Patient left: in chair;with call bell/phone within reach   PT Visit Diagnosis: Unsteadiness on feet (R26.81);Difficulty in walking, not elsewhere classified (R26.2);Muscle weakness (generalized) (M62.81)     Time: 9767-3419 PT Time Calculation (min) (ACUTE ONLY): 38 min  Charges:  $Gait Training: 8-22 mins $Self Care/Home Management: 8-22                      Arby Barrette, PT Pager [phone]    Rexanne Mano 12/28/2019, 12:20 PM

## 2019-12-28 NOTE — [place] FL2 (Addendum)
[place] MEDICAID FL2 LEVEL OF CARE SCREENING TOOL     IDENTIFICATION  Patient Name: [name] [name] Birthdate: [birth year] Sex: female Admission Date (Current Location): 12/22/2019  [facility] and [place] Number:  Herbalist and Address:  [facility], [place] [address], [place], [place] [address]      Provider Number:    Attending Physician Name and Address:  Elgergawy, Silver Huguenin, MD  Relative Name and Phone Number:  Patriciaann Clan": [place] [phone]    Current Level of Care: Hospital Recommended Level of Care: [place] Prior Approval Number:    Date Approved/Denied:   PASRR Number: [id number]  Discharge Plan: SNF    Current Diagnoses: Patient Active Problem List   Diagnosis Date Noted  . Pneumonia due to COVID-19 virus 12/23/2019  . Acute hypoxemic respiratory failure ([place]) 12/23/2019  . Hypokalemia 12/23/2019  . Transaminitis 12/23/2019  . Slow heart rate 03/13/2019  . Mild neurocognitive disorder 02/26/2019  . Clavi 01/20/2019  . Porokeratosis 09/02/2018  . Corns and callosities 09/02/2018  . Elevated CK   . Pressure injury of skin 08/27/2018  . Atrial fibrillation ([place]) 08/27/2018  . Hypernatremia   . Rhabdomyolysis 08/26/2018  . Normocytic anemia 08/26/2018  . Renal insufficiency 08/26/2018  . Community acquired pneumonia 01/13/2013  . Sepsis ([place]) 01/13/2013  . Hyponatremia 01/13/2013  . Hypochloremia 01/13/2013  . Rheumatoid arthritis ([place]) 01/13/2013  . Premature atrial contractions 07/22/2012  . Hyperlipidemia 07/22/2012  . Carotid artery bruit 07/22/2012  . Aortic stenosis 07/22/2012  . Chest pain   . HTN (hypertension)     Orientation RESPIRATION BLADDER Height & Weight     Self, Time, Situation, Place  Normal Incontinent Weight: 183 lb (83 kg) Height:  5\' 6"  (167.6 cm)  BEHAVIORAL SYMPTOMS/MOOD NEUROLOGICAL BOWEL NUTRITION STATUS      Continent Diet  AMBULATORY STATUS COMMUNICATION OF NEEDS  Skin   Limited Assist Verbally Normal                       Personal Care Assistance Level of Assistance  Feeding, Dressing, Bathing Bathing Assistance: Limited assistance Feeding assistance: Limited assistance Dressing Assistance: Limited assistance     Functional Limitations Info  Sight, Hearing, Speech Sight Info: Adequate Hearing Info: Adequate Speech Info: Adequate    SPECIAL CARE FACTORS FREQUENCY  PT (By licensed PT), OT (By licensed OT)     PT Frequency: 5x per week OT Frequency: 5x per week            Contractures Contractures Info: Not present    Additional Factors Info  Code Status Code Status Info: full code             Current Medications (12/28/2019):  This is the current hospital active medication list Current Facility-Administered Medications  Medication Dose Route Frequency Provider Last Rate Last Admin  . acetaminophen (TYLENOL) tablet 650 mg  650 mg Oral Q6H PRN Shela Leff, MD      . albuterol (VENTOLIN HFA) 108 (90 Base) MCG/ACT inhaler 2 puff  2 puff Inhalation Q6H PRN Shela Leff, MD      . ascorbic acid (VITAMIN C) tablet 500 mg  500 mg Oral Daily Shela Leff, MD   500 mg at 12/28/19 0904  . aspirin EC tablet 81 mg  81 mg Oral Daily Elgergawy, Silver Huguenin, MD   81 mg at 12/28/19 0904  . cholecalciferol (VITAMIN D3) tablet 1,000 Units  1,000 Units Oral Daily Shela Leff,  MD   1,000 Units at 12/28/19 0905  . dexamethasone (DECADRON) injection 6 mg  6 mg Intravenous Q24H Elgergawy, Silver Huguenin, MD   6 mg at 12/28/19 7939  . diltiazem (CARDIZEM CD) 24 hr capsule 240 mg  240 mg Oral Daily Elgergawy, Silver Huguenin, MD   240 mg at 12/28/19 0904  . donepezil (ARICEPT) tablet 10 mg  10 mg Oral QHS Elgergawy, Silver Huguenin, MD   10 mg at 12/27/19 2235  . enoxaparin (LOVENOX) injection 40 mg  40 mg Subcutaneous Q12H Elgergawy, Silver Huguenin, MD   40 mg at 12/28/19 0300  . guaiFENesin-dextromethorphan (ROBITUSSIN DM) 100-10 MG/5ML syrup 10 mL   10 mL Oral Q4H PRN Shela Leff, MD   10 mL at 12/23/19 2235  . memantine (NAMENDA) tablet 10 mg  10 mg Oral BID Elgergawy, Silver Huguenin, MD   10 mg at 12/28/19 0904  . metoprolol tartrate (LOPRESSOR) injection 5 mg  5 mg Intravenous Q4H PRN Shela Leff, MD      . mirabegron ER (MYRBETRIQ) tablet 25 mg  25 mg Oral Daily Elgergawy, Silver Huguenin, MD   25 mg at 12/28/19 0904  . oxybutynin (DITROPAN-XL) 24 hr tablet 10 mg  10 mg Oral Daily Elgergawy, Silver Huguenin, MD   10 mg at 12/28/19 0905  . pantoprazole (PROTONIX) EC tablet 40 mg  40 mg Oral Daily Elgergawy, Silver Huguenin, MD   40 mg at 12/28/19 0905  . prochlorperazine (COMPAZINE) injection 10 mg  10 mg Intravenous Q6H PRN Vernelle Emerald, MD   10 mg at 12/26/19 0504  . rosuvastatin (CRESTOR) tablet 20 mg  20 mg Oral Daily Elgergawy, Silver Huguenin, MD   20 mg at 12/28/19 0905  . sertraline (ZOLOFT) tablet 100 mg  100 mg Oral Daily Elgergawy, Silver Huguenin, MD   100 mg at 12/28/19 0904  . zinc sulfate capsule 220 mg  220 mg Oral Daily Shela Leff, MD   220 mg at 12/28/19 9233     Discharge Medications: Please see discharge summary for a list of discharge medications.  Relevant Imaging Results:  Relevant Lab Results:   Additional Information Soc. Sec [id number]  Benard Halsted, LCSW

## 2019-12-28 NOTE — Care Management Important Message (Signed)
Important Message  Patient Details  Name: [name] [name] MRN: [id number] Date of Birth: September 05, 1928   Medicare Important Message Given:  Yes - Important Message mailed due to current National Emergency  Verbal consent obtained due to current National Emergency  Relationship to patient: Child Contact Name: Jeanice Dempsey Call Date: 12/28/19  Time: 1433 Phone: [phone] Outcome: Spoke with contact Important Message mailed to: Patient address on file    Delorse Lek 12/28/2019, 2:33 PM

## 2019-12-28 NOTE — TOC Progression Note (Signed)
Transition of Care [facility]) - Progression Note    Patient Details  Name: [name] [name] MRN: [id number] Date of Birth: [birth year]  [facility] of [facility]) CM/SW [place], LCSW Phone Number: 12/28/2019, 8:43 AM  Clinical Narrative:    CSW submitted for insurance auth for [place] and ambulance transport. Pasrr still pending.    Expected Discharge Plan: Home/Self Care Barriers to Discharge: Continued Medical Work up  Expected Discharge Plan and Services Expected Discharge Plan: Home/Self Care   Discharge Planning Services: CM Consult   Living arrangements for the past 2 months: Single Family Home                                       Social Determinants of Health (SDOH) Interventions    Readmission Risk Interventions No flowsheet data found.

## 2019-12-28 NOTE — Progress Notes (Deleted)
RE:  [name] [name] Date of Birth: 02/20/1928 Date: 12/28/19  Please be advised that the above-named patient will require a short-term nursing home stay - anticipated 30 days or less for rehabilitation and strengthening.  The plan is for return home.

## 2019-12-29 DIAGNOSIS — R498 Other voice and resonance disorders: Secondary | ICD-10-CM | POA: Diagnosis not present

## 2019-12-29 DIAGNOSIS — M6282 Rhabdomyolysis: Secondary | ICD-10-CM | POA: Diagnosis not present

## 2019-12-29 DIAGNOSIS — R2681 Unsteadiness on feet: Secondary | ICD-10-CM | POA: Diagnosis not present

## 2019-12-29 DIAGNOSIS — F0280 Dementia in other diseases classified elsewhere without behavioral disturbance: Secondary | ICD-10-CM | POA: Diagnosis not present

## 2019-12-29 DIAGNOSIS — M069 Rheumatoid arthritis, unspecified: Secondary | ICD-10-CM | POA: Diagnosis not present

## 2019-12-29 DIAGNOSIS — E871 Hypo-osmolality and hyponatremia: Secondary | ICD-10-CM | POA: Diagnosis not present

## 2019-12-29 DIAGNOSIS — U071 COVID-19: Secondary | ICD-10-CM | POA: Diagnosis not present

## 2019-12-29 DIAGNOSIS — M6281 Muscle weakness (generalized): Secondary | ICD-10-CM | POA: Diagnosis not present

## 2019-12-29 DIAGNOSIS — I4891 Unspecified atrial fibrillation: Secondary | ICD-10-CM | POA: Diagnosis not present

## 2019-12-29 DIAGNOSIS — F329 Major depressive disorder, single episode, unspecified: Secondary | ICD-10-CM | POA: Diagnosis not present

## 2019-12-29 DIAGNOSIS — E222 Syndrome of inappropriate secretion of antidiuretic hormone: Secondary | ICD-10-CM | POA: Diagnosis not present

## 2019-12-29 DIAGNOSIS — I359 Nonrheumatic aortic valve disorder, unspecified: Secondary | ICD-10-CM | POA: Diagnosis not present

## 2019-12-29 DIAGNOSIS — I10 Essential (primary) hypertension: Secondary | ICD-10-CM | POA: Diagnosis not present

## 2019-12-29 DIAGNOSIS — F0390 Unspecified dementia without behavioral disturbance: Secondary | ICD-10-CM | POA: Diagnosis not present

## 2019-12-29 DIAGNOSIS — K5901 Slow transit constipation: Secondary | ICD-10-CM | POA: Diagnosis not present

## 2019-12-29 DIAGNOSIS — R2689 Other abnormalities of gait and mobility: Secondary | ICD-10-CM | POA: Diagnosis not present

## 2019-12-29 DIAGNOSIS — D649 Anemia, unspecified: Secondary | ICD-10-CM | POA: Diagnosis not present

## 2019-12-29 DIAGNOSIS — J9601 Acute respiratory failure with hypoxia: Secondary | ICD-10-CM | POA: Diagnosis not present

## 2019-12-29 DIAGNOSIS — J9600 Acute respiratory failure, unspecified whether with hypoxia or hypercapnia: Secondary | ICD-10-CM | POA: Diagnosis not present

## 2019-12-29 DIAGNOSIS — R7989 Other specified abnormal findings of blood chemistry: Secondary | ICD-10-CM | POA: Diagnosis not present

## 2019-12-29 DIAGNOSIS — R41841 Cognitive communication deficit: Secondary | ICD-10-CM | POA: Diagnosis not present

## 2019-12-29 DIAGNOSIS — I480 Paroxysmal atrial fibrillation: Secondary | ICD-10-CM | POA: Diagnosis not present

## 2019-12-29 DIAGNOSIS — E782 Mixed hyperlipidemia: Secondary | ICD-10-CM | POA: Diagnosis not present

## 2019-12-29 DIAGNOSIS — E7849 Other hyperlipidemia: Secondary | ICD-10-CM | POA: Diagnosis not present

## 2019-12-29 DIAGNOSIS — J1282 Pneumonia due to coronavirus disease 2019: Secondary | ICD-10-CM | POA: Diagnosis not present

## 2019-12-29 DIAGNOSIS — R1312 Dysphagia, oropharyngeal phase: Secondary | ICD-10-CM | POA: Diagnosis not present

## 2019-12-29 DIAGNOSIS — F330 Major depressive disorder, recurrent, mild: Secondary | ICD-10-CM | POA: Diagnosis not present

## 2019-12-29 LAB — BASIC METABOLIC PANEL
Anion gap: 11 (ref 5–15)
BUN: 27 mg/dL — ABNORMAL HIGH (ref 8–23)
CO2: 25 mmol/L (ref 22–32)
Calcium: 8.5 mg/dL — ABNORMAL LOW (ref 8.9–10.3)
Chloride: 93 mmol/L — ABNORMAL LOW (ref 98–111)
Creatinine, Ser: 1.01 mg/dL — ABNORMAL HIGH (ref 0.44–1.00)
GFR, Estimated: 53 mL/min — ABNORMAL LOW (ref >60)
Glucose, Bld: 135 mg/dL — ABNORMAL HIGH (ref 70–99)
Potassium: 3.8 mmol/L (ref 3.5–5.1)
Sodium: 129 mmol/L — ABNORMAL LOW (ref 135–145)

## 2019-12-29 LAB — D-DIMER, QUANTITATIVE: D-Dimer, Quant: 5.9 ug/mL-FEU — ABNORMAL HIGH (ref 0.00–0.50)

## 2019-12-29 MED ORDER — DILTIAZEM HCL ER COATED BEADS 240 MG PO CP24: 240.0000 mg | ORAL_CAPSULE | Freq: Every day | ORAL | Status: DC

## 2019-12-29 MED ORDER — PANTOPRAZOLE SODIUM 40 MG PO TBEC: 40.0000 mg | DELAYED_RELEASE_TABLET | Freq: Every day | ORAL | Status: AC

## 2019-12-29 MED ORDER — ALBUTEROL SULFATE HFA 108 (90 BASE) MCG/ACT IN AERS: 2.0000 | INHALATION_SPRAY | Freq: Four times a day (QID) | RESPIRATORY_TRACT | Status: DC | PRN

## 2019-12-29 MED ORDER — ENOXAPARIN SODIUM 40 MG/0.4ML ~~LOC~~ SOLN: 40.0000 mg | Freq: Every day | SUBCUTANEOUS | Status: DC

## 2019-12-29 NOTE — TOC Progression Note (Signed)
Transition of Care [facility]) - Progression Note    Patient Details  Name: Kahmari Herard MRN: [id number] Date of Birth: [birth year]  Transition of Care [facility]) CM/SW [place], LCSW Phone Number: 12/29/2019, 11:36 AM  Clinical Narrative:    Insurance authorization approved for [employer]: # [id number]. [facility] transport was denied. CSW updated patient's daughter. Pasrr received and placed on FL2.    Expected Discharge Plan: Skilled Nursing Facility Barriers to Discharge: Barriers Resolved  Expected Discharge Plan and Services Expected Discharge Plan: [place] In-house Referral: Clinical Social Work Discharge Planning Services: CM Consult Post Acute Care Choice: [place] arrangements for the past 2 months: Single Family Home                                       Social Determinants of Health (SDOH) Interventions    Readmission Risk Interventions No flowsheet data found.

## 2019-12-29 NOTE — Progress Notes (Signed)
Vincent @ [phone] spoke with  Nurse Francene Finders report given

## 2019-12-29 NOTE — Discharge Summary (Signed)
[name] [name], is a 86 y.o. female  DOB April 20, 1928  MRN [id number].  Admission date:  12/22/2019  Admitting Physician  Shela Leff, MD  Discharge Date:  12/29/2019   Primary MD  Merrilee Seashore, MD  Recommendations for primary care physician for things to follow:  -Patient to continue with fluid restriction 1.2 L/day for next 3 days. -Please check CBC, CMP in 3 days to ensure improving potassium levels. -Continue with Lovenox DVT prophylaxis days for total of 15 days given elevated D-dimers. -Keep encouraging to use incentive spirometer.  Admission Diagnosis  Acute on chronic respiratory failure with hypoxia (HCC) [J96.21] Pneumonia due to COVID-19 virus [U07.1, J12.82]   Discharge Diagnosis  Acute on chronic respiratory failure with hypoxia (HCC) [J96.21] Pneumonia due to COVID-19 virus [U07.1, J12.82]    Principal Problem:   Pneumonia due to COVID-19 virus Active Problems:   Atrial fibrillation ([place])   Acute hypoxemic respiratory failure (HCC)   Hypokalemia   Transaminitis      Past Medical History:  Diagnosis Date  . Aortic stenosis   . Atrial fibrillation ([place])   . Carotid artery bruit 07/22/2012  . Chest pain    myoview 04/24/07-mild-mod perfusion defect with mild-mod superimposed ischemiain the mid inferior, apicla inferior, basal inferolateral and mid inferolateral regions  . Community acquired pneumonia 01/13/2013  . Edema   . Gallstone   . Generalized osteoarthritis   . HTN (hypertension)   . Hyperlipidemia   . Hypochloremia 01/13/2013  . Hyponatremia 01/13/2013  . IFG (impaired fasting glucose)   . Mild neurocognitive disorder 02/26/2019  . Nonspecific abnormal electrocardiogram (ECG) (EKG)   . Normocytic anemia 08/26/2018  . Porokeratosis 09/02/2018  . Premature atrial contractions 07/22/2012  . Renal insufficiency 08/26/2018  . Rhabdomyolysis 08/26/2018  . Rheumatoid arthritis  ([place]) 01/13/2013  . Vertigo    as late effect of cerebrovascular disease    Past Surgical History:  Procedure Laterality Date  . APPENDECTOMY    . BUNIONECTOMY Bilateral   . cataract surgery Bilateral 05/2016  . gallstones removal    . TONSILLECTOMY         History of present illness and  Hospital Course:     Kindly see H&P for history of present illness and admission details, please review complete Labs, Consult reports and Test reports for all details in brief  HPI  from the history and physical done on the day of admission 12/22/2019  HPI: [name] [name] is a 86 y.o. female with medical history significant of paroxysmal A. fib not on anticoagulation, aortic stenosis, hypertension, hyperlipidemia, rheumatoid arthritis, dementia, depression presenting the ED with with a chief complaint of cough.  Patient recently tested positive for COVID-19 during ED visit on 12/16/2019.  She did not have any hypoxemia at that time and was discharged.  Subsequently received outpatient bamlanivimab/etesevimab infusion on 12/18/2019.  Patient was then sent to the ED by her PCP on 12/21/2019 for evaluation of low oxygen saturation of 71% on room air.  She was satting 97% on room air  at triage with no signs of respiratory distress.  Chest x-ray revealed new bibasilar airspace opacities concerning for multifocal pneumonia.  Patient left the ED before being evaluated by a physician.    Patient states she was sent to the ED by her doctor who told her she had pneumonia.  She has no complaints other than a cough.  Denies fevers, chills, body aches, fatigue, shortness of breath, chest pain, nausea, vomiting, abdominal pain, diarrhea, or dysuria.  No additional history could be obtained from her.  ED Course: Afebrile.  Not hypoxic at rest but sats dropped to 70s on room air with ambulation.  WBC 13.0, hemoglobin 11.5 (at baseline), platelet 474k.  Sodium 137, potassium 2.7, chloride 96, bicarb 28, BUN 28, creatinine  1.1, glucose 175.  AST 62, ALT 48.  Alk phos and T bili normal.  Lactic acid level normal.  Procalcitonin <0.10.  Inflammatory markers elevated: D-dimer 6.18, LDH 279, fibrinogen 798.  Ferritin and CRP levels pending.  Blood culture x2 pending.  Patient received IV Decadron 6 mg and remdesivir.  Hospital Course   COVID-19 viral pneumonia  -She is vaccinated with Mederma in April . -imaging significant for bilateral opacities. -She was treated with.  During hospital stay, no hypoxia, steroids has been stopped on discharge. -Keep encouraging to use incentive spirometer. -Received outpatientbamlanivimab/etesevimab infusionon 12/18/2019  Elevated D-dimers -D Dimer is elevated, CTA chest with no evidence of PE, venous Dopplers with no evidence of DVT, but it does remain elevated at time of discharge, recommendation to continue with subcu Lovenox DVT prophylaxis days for another 15 days .   Hypokalemia -Repleted,   1/4 blood culture with staph hominis, -Most likely contamination, no indication to treat, procalcitonin within normal limit.  Mild transaminitis -Late due to Covid infection, trending down.  Hyponatremia -Sodium dropped to 127 despite holding her hydrochlorothiazide, it is most likely due to mild SIADH, this has improved with holding fluid restriction, sodium is up to 129 today, keep on fluid restriction 1.2 L over the next 3 days, then repeat BMP to ensure improvement of her potassium level.    Paroxysmal A. Fib CHA2DS2VAScat least 4. Not on chronic anticoagulation as she only had one single episode during hospital stay in 2020 with no recurrence .   -Keep K >4, and magnesium > 2.. -I have discussed with cardiology Dr. Burt Knack, patient is extremely high risk for falls, so frequent over the last year, with one fall where she remained on the floor for few days, risks outweighed benefits, no full dose anticoagulation, continue with baby aspirin.  Have discussed this and  details with patient's daughter, she is agreeable with plan. -Blood pressure and heart rate currently acceptable on Cardizem CD 240 mg oral daily .  Hypertension -DC hydrochlorothiazide because of hyponatremia, blood pressure controlled on current dose of Cardizem CD 240 mg oral daily.  Hyperlipidemia -Resume statin   Dementia -Resume home meds   Depression -Resume home meds    Discharge Condition:  stable   Follow UP   Contact information for follow-up providers    Merrilee Seashore, MD. Schedule an appointment as soon as possible for a visit.   Specialty: Internal Medicine Contact information: [address] [place] [address] [phone]            Contact information for after-discharge care    Destination    [facility] .   Service: Skilled Nursing Contact information: [place] [phone]  Discharge Instructions  and  Discharge Medications    Discharge Instructions    Diet - low sodium heart healthy   Complete by: As directed    Discharge instructions   Complete by: As directed    Follow with Primary MD Merrilee Seashore, MD or SNF physician  Get CBC, CMP, checked  by Primary MD next visit.    Activity: As tolerated with Full fall precautions use walker/cane & assistance as needed   Disposition Home   Diet: Heart Healthy  , with feeding assistance and aspiration precautions.  For Heart failure patients - Check your Weight same time everyday, if you gain over 2 pounds, or you develop in leg swelling, experience more shortness of breath or chest pain, call your Primary MD immediately. Follow Cardiac Low Salt Diet and 1.5 lit/day fluid restriction.   On your next visit with your primary care physician please Get Medicines reviewed and adjusted.   Please request your Prim.MD to go over all Hospital Tests and Procedure/Radiological  results at the follow up, please get all Hospital records sent to your Prim MD by signing hospital release before you go home.   If you experience worsening of your admission symptoms, develop shortness of breath, life threatening emergency, suicidal or homicidal thoughts you must seek medical attention immediately by calling 911 or calling your MD immediately  if symptoms less severe.  You Must read complete instructions/literature along with all the possible adverse reactions/side effects for all the Medicines you take and that have been prescribed to you. Take any new Medicines after you have completely understood and accpet all the possible adverse reactions/side effects.   Do not drive, operating heavy machinery, perform activities at heights, swimming or participation in water activities or provide baby sitting services if your were admitted for syncope or siezures until you have seen by Primary MD or a Neurologist and advised to do so again.  Do not drive when taking Pain medications.    Do not take more than prescribed Pain, Sleep and Anxiety Medications  Special Instructions: If you have smoked or chewed Tobacco  in the last 2 yrs please stop smoking, stop any regular Alcohol  and or any Recreational drug use.  Wear Seat belts while driving.   Please note  You were cared for by a hospitalist during your hospital stay. If you have any questions about your discharge medications or the care you received while you were in the hospital after you are discharged, you can call the unit and asked to speak with the hospitalist on call if the hospitalist that took care of you is not available. Once you are discharged, your primary care physician will handle any further medical issues. Please note that NO REFILLS for any discharge medications will be authorized once you are discharged, as it is imperative that you return to your primary care physician (or establish a relationship with a primary  care physician if you do not have one) for your aftercare needs so that they can reassess your need for medications and monitor your lab values.   Increase activity slowly   Complete by: As directed      Allergies as of 12/29/2019      Reactions   Codeine Nausea And Vomiting, Other (See Comments)   Dizziness      Medication List    STOP taking these medications   hydrochlorothiazide 25 MG tablet Commonly known as: HYDRODIURIL     TAKE these medications   albuterol 108 (  90 Base) MCG/ACT inhaler Commonly known as: VENTOLIN HFA Inhale 2 puffs into the lungs every 6 (six) hours as needed for wheezing or shortness of breath.   aspirin EC 81 MG tablet Take 81 mg by mouth daily.   diltiazem 240 MG 24 hr capsule Commonly known as: CARDIZEM CD Take 1 capsule (240 mg total) by mouth daily. Start taking on: December 30, 2019   donepezil 10 MG tablet Commonly known as: ARICEPT Take 10 mg by mouth at bedtime.   enoxaparin 40 MG/0.4ML injection Commonly known as: LOVENOX Inject 0.4 mLs (40 mg total) into the skin daily for 15 days. Start taking on: December 30, 2019   memantine 10 MG tablet Commonly known as: Namenda Take 1 tablet (10 mg total) by mouth 2 (two) times daily.   Myrbetriq 25 MG Tb24 tablet Generic drug: mirabegron ER Take 25 mg by mouth daily.   oxybutynin 10 MG 24 hr tablet Commonly known as: DITROPAN-XL Take 10 mg by mouth daily.   pantoprazole 40 MG tablet Commonly known as: PROTONIX Take 1 tablet (40 mg total) by mouth daily. Start taking on: December 30, 2019   rosuvastatin 20 MG tablet Commonly known as: CRESTOR Take 20 mg by mouth daily.   sertraline 100 MG tablet Commonly known as: ZOLOFT Take 100 mg by mouth daily.   VITAMIN D PO Take 1 capsule by mouth daily.         Diet and Activity recommendation: See Discharge Instructions above   Consults obtained -  none   Major procedures and Radiology Reports - PLEASE review detailed and  final reports for all details, in brief -      CT Head Wo Contrast  Result Date: 12/16/2019 CLINICAL DATA:  Headache COVID positive EXAM: CT HEAD WITHOUT CONTRAST TECHNIQUE: Contiguous axial images were obtained from the base of the skull through the vertex without intravenous contrast. COMPARISON:  None. FINDINGS: Brain: No evidence of acute territorial infarction, hemorrhage, hydrocephalus,extra-axial collection or mass lesion/mass effect. There is dilatation the ventricles and sulci consistent with age-related atrophy. Low-attenuation changes in the deep white matter consistent with small vessel ischemia. Vascular: No hyperdense vessel or unexpected calcification. Skull: The skull is intact. No fracture or focal lesion identified. Sinuses/Orbits: The visualized paranasal sinuses and mastoid air cells are clear. The orbits and globes intact. Other: None IMPRESSION: No acute intracranial abnormality. Findings consistent with age related atrophy and chronic small vessel ischemia Electronically Signed   By: Prudencio Pair M.D.   On: 12/16/2019 23:29   CT ANGIO CHEST PE W OR WO CONTRAST  Result Date: 12/23/2019 CLINICAL DATA:  Short of breath, COVID positive EXAM: CT ANGIOGRAPHY CHEST WITH CONTRAST TECHNIQUE: Multidetector CT imaging of the chest was performed using the standard protocol during bolus administration of intravenous contrast. Multiplanar CT image reconstructions and MIPs were obtained to evaluate the vascular anatomy. CONTRAST:  40m OMNIPAQUE IOHEXOL 350 MG/ML SOLN COMPARISON:  None. FINDINGS: Cardiovascular: Contrast bolus timing is optimal, but there is significant respiratory motion artifact. Evaluation of the pulmonary arteries is poor beyond proximal segmental level. There is no acute pulmonary embolism identified within this limitation. Enlargement of the main pulmonary artery suggesting pulmonary arterial hypertension. Cardiomegaly with moderate pericardial effusion. Reflux of contrast  into the hepatic veins suggests right heart dysfunction. Mediastinum/Nodes: No enlarged lymph nodes identified. Lungs/Pleura: Suboptimal evaluation due to respiratory motion and imaging during expiration. Areas of subsegmental atelectasis. Patchy opacities, which may represent additional atelectasis, edema, or pneumonia. Bronchiectasis at the lung  bases. Small bilateral pleural effusions. Pleural fluid extends into the right oblique fissure. Upper Abdomen: No acute abnormality. Musculoskeletal: No acute osseous abnormality. Review of the MIP images confirms the above findings. IMPRESSION: Suboptimal evaluation due to respiratory motion. No acute pulmonary embolism identified to the proximal segmental level. Cardiomegaly with moderate pericardial effusion. Evidence of pulmonary arterial hypertension. Scattered patchy pulmonary opacities, which may reflect atelectasis, edema and/or pneumonia. Given above and small bilateral pleural effusions, edema is favored. Electronically Signed   By: Macy Mis M.D.   On: 12/23/2019 08:16   DG Chest Portable 1 View  Result Date: 12/21/2019 CLINICAL DATA:  Shortness of breath.  COVID for a week. EXAM: PORTABLE CHEST 1 VIEW COMPARISON:  12/16/2019. FINDINGS: Enlarged cardiac silhouette. Aortic atherosclerosis. No visible pneumothorax. New bibasilar airspace opacities. Limited evaluation for pleural effusions given bibasilar opacities and AP portable technique. No acute osseous abnormality. IMPRESSION: 1. New bibasilar airspace opacities, concerning for multifocal pneumonia. 2. Cardiomegaly. Electronically Signed   By: Margaretha Sheffield MD   On: 12/21/2019 13:18   DG Chest Port 1 View  Result Date: 12/16/2019 CLINICAL DATA:  86 year old female with positive COVID test. EXAM: PORTABLE CHEST 1 VIEW COMPARISON:  Chest radiograph dated 08/26/2018. FINDINGS: There is cardiomegaly with mild vascular congestion. No focal consolidation, pleural effusion, or pneumothorax. Minimal  bibasilar densities, likely atelectasis. Atherosclerotic calcification of the aorta. No acute osseous pathology. IMPRESSION: Cardiomegaly with mild vascular congestion. No focal consolidation. Electronically Signed   By: Anner Crete M.D.   On: 12/16/2019 21:33   VAS [place] LOWER EXTREMITY VENOUS (DVT)  Result Date: 12/24/2019  Lower Venous DVT Study Indications: Elevated ddimer.  Comparison Study: no prior Performing Technologist: Abram Sander RVS  Examination Guidelines: A complete evaluation includes B-mode imaging, spectral Doppler, color Doppler, and power Doppler as needed of all accessible portions of each vessel. Bilateral testing is considered an integral part of a complete examination. Limited examinations for reoccurring indications may be performed as noted. The reflux portion of the exam is performed with the patient in reverse Trendelenburg.  +---------+---------------+---------+-----------+----------+--------------+ RIGHT    CompressibilityPhasicitySpontaneityPropertiesThrombus Aging +---------+---------------+---------+-----------+----------+--------------+ CFV      Full           Yes      Yes                                 +---------+---------------+---------+-----------+----------+--------------+ SFJ      Full                                                        +---------+---------------+---------+-----------+----------+--------------+ FV Prox  Full                                                        +---------+---------------+---------+-----------+----------+--------------+ FV Mid   Full                                                        +---------+---------------+---------+-----------+----------+--------------+  FV DistalFull                                                        +---------+---------------+---------+-----------+----------+--------------+ PFV      Full                                                         +---------+---------------+---------+-----------+----------+--------------+ POP      Full           Yes      Yes                                 +---------+---------------+---------+-----------+----------+--------------+ PTV      Full                                                        +---------+---------------+---------+-----------+----------+--------------+ PERO     Full                                                        +---------+---------------+---------+-----------+----------+--------------+   +---------+---------------+---------+-----------+----------+-------------------+ LEFT     CompressibilityPhasicitySpontaneityPropertiesThrombus Aging      +---------+---------------+---------+-----------+----------+-------------------+ CFV      Full           Yes      Yes                                      +---------+---------------+---------+-----------+----------+-------------------+ SFJ      Full                                                             +---------+---------------+---------+-----------+----------+-------------------+ FV Prox  Full                                                             +---------+---------------+---------+-----------+----------+-------------------+ FV Mid                  Yes      Yes                                      +---------+---------------+---------+-----------+----------+-------------------+ FV Distal               Yes  Yes                                      +---------+---------------+---------+-----------+----------+-------------------+ PFV      Full                                                             +---------+---------------+---------+-----------+----------+-------------------+ POP      Full           Yes      Yes                                      +---------+---------------+---------+-----------+----------+-------------------+ PTV      Full                                                              +---------+---------------+---------+-----------+----------+-------------------+ PERO                                                  Not well visualized +---------+---------------+---------+-----------+----------+-------------------+     Summary: BILATERAL: - No evidence of deep vein thrombosis seen in the lower extremities, bilaterally. - No evidence of superficial venous thrombosis in the lower extremities, bilaterally. -No evidence of popliteal cyst, bilaterally.   *See table(s) above for measurements and observations. Electronically signed by Jamelle Haring on 12/24/2019 at 7:06:20 PM.    Final     Micro Results     Recent Results (from the past 240 hour(s))  Blood Culture (routine x 2)     Status: None   Collection Time: 12/23/19  3:10 AM   Specimen: BLOOD  Result Value Ref Range Status   Specimen Description BLOOD RIGHT ARM  Final   Special Requests   Final    BOTTLES DRAWN AEROBIC AND ANAEROBIC Blood Culture adequate volume   Culture   Final    NO GROWTH 5 DAYS Performed at [facility], [address], [place], [place] [address]    Report Status 12/28/2019 FINAL  Final  Blood Culture (routine x 2)     Status: Abnormal   Collection Time: 12/23/19 10:05 AM   Specimen: BLOOD  Result Value Ref Range Status   Specimen Description BLOOD RIGHT ANTECUBITAL  Final   Special Requests   Final    BOTTLES DRAWN AEROBIC AND ANAEROBIC Blood Culture adequate volume   Culture  Setup Time   Final    GRAM POSITIVE COCCI IN CLUSTERS AEROBIC BOTTLE ONLY CRITICAL RESULT CALLED TO, READ BACK BY AND VERIFIED WITH: PHARMD A SULLIVAN [id number] AT 67 AM BY CM    Culture (A)  Final    STAPHYLOCOCCUS HOMINIS THE SIGNIFICANCE OF ISOLATING THIS ORGANISM FROM A SINGLE SET OF BLOOD CULTURES WHEN MULTIPLE SETS ARE DRAWN IS UNCERTAIN. PLEASE NOTIFY THE MICROBIOLOGY DEPARTMENT WITHIN ONE WEEK IF SPECIATION  AND SENSITIVITIES ARE REQUIRED. Performed at [facility], [place] [address], [place], [place] [address]    Report Status 12/25/2019 FINAL  Final  Blood Culture ID Panel (Reflexed)     Status: Abnormal   Collection Time: 12/23/19 10:05 AM  Result Value Ref Range Status   Enterococcus faecalis NOT DETECTED NOT DETECTED Final   Enterococcus Faecium NOT DETECTED NOT DETECTED Final   Listeria monocytogenes NOT DETECTED NOT DETECTED Final   Staphylococcus species DETECTED (A) NOT DETECTED Final    Comment: CRITICAL RESULT CALLED TO, READ BACK BY AND VERIFIED WITH: PHARMD A SULLIVAN [id number] AT 737 AM BY CM    Staphylococcus aureus (BCID) NOT DETECTED NOT DETECTED Final   Staphylococcus epidermidis NOT DETECTED NOT DETECTED Final   Staphylococcus lugdunensis NOT DETECTED NOT DETECTED Final   Streptococcus species NOT DETECTED NOT DETECTED Final   Streptococcus agalactiae NOT DETECTED NOT DETECTED Final   Streptococcus pneumoniae NOT DETECTED NOT DETECTED Final   Streptococcus pyogenes NOT DETECTED NOT DETECTED Final   A.calcoaceticus-baumannii NOT DETECTED NOT DETECTED Final   Bacteroides fragilis NOT DETECTED NOT DETECTED Final   Enterobacterales NOT DETECTED NOT DETECTED Final   Enterobacter cloacae complex NOT DETECTED NOT DETECTED Final   Escherichia coli NOT DETECTED NOT DETECTED Final   Klebsiella aerogenes NOT DETECTED NOT DETECTED Final   Klebsiella oxytoca NOT DETECTED NOT DETECTED Final   Klebsiella pneumoniae NOT DETECTED NOT DETECTED Final   Proteus species NOT DETECTED NOT DETECTED Final   Salmonella species NOT DETECTED NOT DETECTED Final   Serratia marcescens NOT DETECTED NOT DETECTED Final   Haemophilus influenzae NOT DETECTED NOT DETECTED Final   Neisseria meningitidis NOT DETECTED NOT DETECTED Final   Pseudomonas aeruginosa NOT DETECTED NOT DETECTED Final   Stenotrophomonas maltophilia NOT DETECTED NOT DETECTED Final   Candida albicans NOT DETECTED NOT DETECTED Final   Candida auris NOT DETECTED NOT DETECTED Final    Candida glabrata NOT DETECTED NOT DETECTED Final   Candida krusei NOT DETECTED NOT DETECTED Final   Candida parapsilosis NOT DETECTED NOT DETECTED Final   Candida tropicalis NOT DETECTED NOT DETECTED Final   Cryptococcus neoformans/gattii NOT DETECTED NOT DETECTED Final    Comment: Performed at [facility], [address], [place], [place] [address]       Today   Subjective:   Marcelle Hepner today shows denies any complaints, no chest pain, no shortness of breath, no fever or chills.   Objective:   Blood pressure 133/81, pulse 87, temperature 97.9 F (36.6 C), temperature source Oral, resp. rate 18, height 5' 6"  (1.676 m), weight 83 kg, SpO2 95 %.   Intake/Output Summary (Last 24 hours) at 12/29/2019 1220 Last data filed at 12/29/2019 0900 Gross per 24 hour  Intake 480 ml  Output 1100 ml  Net -620 ml    Exam Awake Alert, pleasant, easily distracted.   Symmetrical Chest wall movement, Good air movement bilaterally, CTAB Irregular irregular,No Gallops,Rubs or new Murmurs, No Parasternal Heave +ve B.Sounds, Abd Soft, Non tender, No rebound -guarding or rigidity. No Cyanosis, Clubbing or edema, No new Rash or bruise  Data Review   CBC w Diff:  Lab Results  Component Value Date   WBC 12.9 (H) 12/28/2019   HGB 10.6 (L) 12/28/2019   HGB 10.6 (L) 12/16/2018   HCT 31.2 (L) 12/28/2019   HCT 33.2 (L) 12/16/2018   PLT 498 (H) 12/28/2019   PLT 282 12/16/2018   LYMPHOPCT 8 12/28/2019   MONOPCT 8  12/28/2019   EOSPCT 0 12/28/2019   BASOPCT 0 12/28/2019    CMP:  Lab Results  Component Value Date   NA 129 (L) 12/29/2019   NA 139 12/16/2018   K 3.8 12/29/2019   CL 93 (L) 12/29/2019   CO2 25 12/29/2019   BUN 27 (H) 12/29/2019   BUN 27 12/16/2018   CREATININE 1.01 (H) 12/29/2019   PROT 6.3 (L) 12/28/2019   PROT 7.3 12/16/2018   ALBUMIN 2.2 (L) 12/28/2019   ALBUMIN 4.1 12/16/2018   BILITOT 0.9 12/28/2019   BILITOT 0.3 12/16/2018   ALKPHOS 52 12/28/2019   AST 29  12/28/2019   ALT 27 12/28/2019  .   Total Time in preparing paper work, data evaluation and todays exam - 79 minutes  Phillips Climes M.D on 12/29/2019 at 12:20 PM  [facility]   Office  [phone]

## 2019-12-29 NOTE — Discharge Instructions (Signed)
Follow with Primary MD Merrilee Seashore, MD or SNF physician  Get CBC, CMP, checked  by Primary MD next visit.    Activity: As tolerated with Full fall precautions use walker/cane & assistance as needed   Disposition Home   Diet: Heart Healthy  , with feeding assistance and aspiration precautions.  For Heart failure patients - Check your Weight same time everyday, if you gain over 2 pounds, or you develop in leg swelling, experience more shortness of breath or chest pain, call your Primary MD immediately. Follow Cardiac Low Salt Diet and 1.5 lit/day fluid restriction.   On your next visit with your primary care physician please Get Medicines reviewed and adjusted.   Please request your Prim.MD to go over all Hospital Tests and Procedure/Radiological results at the follow up, please get all Hospital records sent to your Prim MD by signing hospital release before you go home.   If you experience worsening of your admission symptoms, develop shortness of breath, life threatening emergency, suicidal or homicidal thoughts you must seek medical attention immediately by calling 911 or calling your MD immediately  if symptoms less severe.  You Must read complete instructions/literature along with all the possible adverse reactions/side effects for all the Medicines you take and that have been prescribed to you. Take any new Medicines after you have completely understood and accpet all the possible adverse reactions/side effects.   Do not drive, operating heavy machinery, perform activities at heights, swimming or participation in water activities or provide baby sitting services if your were admitted for syncope or siezures until you have seen by Primary MD or a Neurologist and advised to do so again.  Do not drive when taking Pain medications.    Do not take more than prescribed Pain, Sleep and Anxiety Medications  Special Instructions: If you have smoked or chewed Tobacco  in the last 2  yrs please stop smoking, stop any regular Alcohol  and or any Recreational drug use.  Wear Seat belts while driving.   Please note  You were cared for by a hospitalist during your hospital stay. If you have any questions about your discharge medications or the care you received while you were in the hospital after you are discharged, you can call the unit and asked to speak with the hospitalist on call if the hospitalist that took care of you is not available. Once you are discharged, your primary care physician will handle any further medical issues. Please note that NO REFILLS for any discharge medications will be authorized once you are discharged, as it is imperative that you return to your primary care physician (or establish a relationship with a primary care physician if you do not have one) for your aftercare needs so that they can reassess your need for medications and monitor your lab values.

## 2019-12-29 NOTE — Progress Notes (Signed)
RE: [name] [name] Date of Birth: 06/22/1928 Date: 12/29/19  Please be advised that the above-named patient has a primary diagnosis of dementia which supersedes any psychiatric diagnosis. Patient will require a short-term nursing home stay - anticipated 30 days or less for rehabilitation and strengthening.  The plan is for return home.

## 2019-12-29 NOTE — TOC Transition Note (Addendum)
Transition of Care [facility]) - CM/SW Discharge Note   Patient Details  Name: [name] [name] MRN: [id number] Date of Birth: 02/14/1929  [facility] CM/SW Contact:  [name] Halsted, LCSW Phone Number: 12/29/2019, 12:48 PM   Clinical Narrative:    Patient will DC to: [facility]  Anticipated DC date: 12/29/19 Family notified: Daughter, [name] [name] Transport by: [name] [name] 2:30pm   Per MD patient ready for DC to [facility]. RN to call report prior to discharge [phone] Room 808P). RN, patient, patient's family, and facility notified of DC. Discharge Summary and FL2 sent to facility. DC packet on chart. Ambulance transport requested for patient.   CSW will sign off for now as social work intervention is no longer needed. Please consult [place] again if new needs arise.      Final next level of care: Skilled Nursing Facility Barriers to Discharge: Barriers Resolved   Patient Goals and CMS Choice Patient states their goals for this hospitalization and ongoing recovery are:: Rehab CMS Medicare.gov Compare Post Acute Care list provided to:: Patient Choice offered to / list presented to : Patient, Adult Children  Discharge Placement   Existing PASRR number confirmed : 12/29/19          Patient chooses bed at: [facility] Patient to be transferred to facility by: [place] Name of family member notified: Daughter, [name] [name] Patient and family notified of of transfer: 12/29/19  Discharge Plan and Services In-house Referral: Clinical Social Work Discharge Planning Services: [employer] Consult Post Acute Care Choice: [place]                               Social Determinants of Health (SDOH) Interventions     Readmission Risk Interventions No flowsheet data found.

## 2019-12-30 DIAGNOSIS — R2681 Unsteadiness on feet: Secondary | ICD-10-CM | POA: Diagnosis not present

## 2019-12-30 DIAGNOSIS — U071 COVID-19: Secondary | ICD-10-CM | POA: Diagnosis not present

## 2019-12-30 DIAGNOSIS — I359 Nonrheumatic aortic valve disorder, unspecified: Secondary | ICD-10-CM | POA: Diagnosis not present

## 2019-12-30 DIAGNOSIS — D649 Anemia, unspecified: Secondary | ICD-10-CM | POA: Diagnosis not present

## 2019-12-30 DIAGNOSIS — F0390 Unspecified dementia without behavioral disturbance: Secondary | ICD-10-CM | POA: Diagnosis not present

## 2019-12-30 DIAGNOSIS — F329 Major depressive disorder, single episode, unspecified: Secondary | ICD-10-CM | POA: Diagnosis not present

## 2019-12-30 DIAGNOSIS — J9600 Acute respiratory failure, unspecified whether with hypoxia or hypercapnia: Secondary | ICD-10-CM | POA: Diagnosis not present

## 2019-12-30 DIAGNOSIS — M6281 Muscle weakness (generalized): Secondary | ICD-10-CM | POA: Diagnosis not present

## 2019-12-30 DIAGNOSIS — E782 Mixed hyperlipidemia: Secondary | ICD-10-CM | POA: Diagnosis not present

## 2019-12-30 DIAGNOSIS — I10 Essential (primary) hypertension: Secondary | ICD-10-CM | POA: Diagnosis not present

## 2019-12-30 DIAGNOSIS — M069 Rheumatoid arthritis, unspecified: Secondary | ICD-10-CM | POA: Diagnosis not present

## 2019-12-30 DIAGNOSIS — I4891 Unspecified atrial fibrillation: Secondary | ICD-10-CM | POA: Diagnosis not present

## 2019-12-31 DIAGNOSIS — J1282 Pneumonia due to coronavirus disease 2019: Secondary | ICD-10-CM | POA: Diagnosis not present

## 2019-12-31 DIAGNOSIS — F0390 Unspecified dementia without behavioral disturbance: Secondary | ICD-10-CM | POA: Diagnosis not present

## 2019-12-31 DIAGNOSIS — D649 Anemia, unspecified: Secondary | ICD-10-CM | POA: Diagnosis not present

## 2019-12-31 DIAGNOSIS — F329 Major depressive disorder, single episode, unspecified: Secondary | ICD-10-CM | POA: Diagnosis not present

## 2019-12-31 DIAGNOSIS — I359 Nonrheumatic aortic valve disorder, unspecified: Secondary | ICD-10-CM | POA: Diagnosis not present

## 2019-12-31 DIAGNOSIS — R2681 Unsteadiness on feet: Secondary | ICD-10-CM | POA: Diagnosis not present

## 2019-12-31 DIAGNOSIS — I4891 Unspecified atrial fibrillation: Secondary | ICD-10-CM | POA: Diagnosis not present

## 2019-12-31 DIAGNOSIS — J9601 Acute respiratory failure with hypoxia: Secondary | ICD-10-CM | POA: Diagnosis not present

## 2019-12-31 DIAGNOSIS — E782 Mixed hyperlipidemia: Secondary | ICD-10-CM | POA: Diagnosis not present

## 2019-12-31 DIAGNOSIS — U071 COVID-19: Secondary | ICD-10-CM | POA: Diagnosis not present

## 2019-12-31 DIAGNOSIS — R7989 Other specified abnormal findings of blood chemistry: Secondary | ICD-10-CM | POA: Diagnosis not present

## 2019-12-31 DIAGNOSIS — I10 Essential (primary) hypertension: Secondary | ICD-10-CM | POA: Diagnosis not present

## 2019-12-31 DIAGNOSIS — M6281 Muscle weakness (generalized): Secondary | ICD-10-CM | POA: Diagnosis not present

## 2019-12-31 DIAGNOSIS — J9600 Acute respiratory failure, unspecified whether with hypoxia or hypercapnia: Secondary | ICD-10-CM | POA: Diagnosis not present

## 2019-12-31 DIAGNOSIS — M069 Rheumatoid arthritis, unspecified: Secondary | ICD-10-CM | POA: Diagnosis not present

## 2020-01-04 DIAGNOSIS — J9600 Acute respiratory failure, unspecified whether with hypoxia or hypercapnia: Secondary | ICD-10-CM | POA: Diagnosis not present

## 2020-01-04 DIAGNOSIS — I359 Nonrheumatic aortic valve disorder, unspecified: Secondary | ICD-10-CM | POA: Diagnosis not present

## 2020-01-04 DIAGNOSIS — E782 Mixed hyperlipidemia: Secondary | ICD-10-CM | POA: Diagnosis not present

## 2020-01-04 DIAGNOSIS — D649 Anemia, unspecified: Secondary | ICD-10-CM | POA: Diagnosis not present

## 2020-01-04 DIAGNOSIS — M069 Rheumatoid arthritis, unspecified: Secondary | ICD-10-CM | POA: Diagnosis not present

## 2020-01-04 DIAGNOSIS — R2681 Unsteadiness on feet: Secondary | ICD-10-CM | POA: Diagnosis not present

## 2020-01-04 DIAGNOSIS — I4891 Unspecified atrial fibrillation: Secondary | ICD-10-CM | POA: Diagnosis not present

## 2020-01-04 DIAGNOSIS — F329 Major depressive disorder, single episode, unspecified: Secondary | ICD-10-CM | POA: Diagnosis not present

## 2020-01-04 DIAGNOSIS — M6281 Muscle weakness (generalized): Secondary | ICD-10-CM | POA: Diagnosis not present

## 2020-01-04 DIAGNOSIS — U071 COVID-19: Secondary | ICD-10-CM | POA: Diagnosis not present

## 2020-01-04 DIAGNOSIS — I10 Essential (primary) hypertension: Secondary | ICD-10-CM | POA: Diagnosis not present

## 2020-01-04 DIAGNOSIS — F0390 Unspecified dementia without behavioral disturbance: Secondary | ICD-10-CM | POA: Diagnosis not present

## 2020-01-06 DIAGNOSIS — I4891 Unspecified atrial fibrillation: Secondary | ICD-10-CM | POA: Diagnosis not present

## 2020-01-06 DIAGNOSIS — R2681 Unsteadiness on feet: Secondary | ICD-10-CM | POA: Diagnosis not present

## 2020-01-06 DIAGNOSIS — F329 Major depressive disorder, single episode, unspecified: Secondary | ICD-10-CM | POA: Diagnosis not present

## 2020-01-06 DIAGNOSIS — J9600 Acute respiratory failure, unspecified whether with hypoxia or hypercapnia: Secondary | ICD-10-CM | POA: Diagnosis not present

## 2020-01-06 DIAGNOSIS — E782 Mixed hyperlipidemia: Secondary | ICD-10-CM | POA: Diagnosis not present

## 2020-01-06 DIAGNOSIS — F0390 Unspecified dementia without behavioral disturbance: Secondary | ICD-10-CM | POA: Diagnosis not present

## 2020-01-06 DIAGNOSIS — M6281 Muscle weakness (generalized): Secondary | ICD-10-CM | POA: Diagnosis not present

## 2020-01-06 DIAGNOSIS — M069 Rheumatoid arthritis, unspecified: Secondary | ICD-10-CM | POA: Diagnosis not present

## 2020-01-06 DIAGNOSIS — I359 Nonrheumatic aortic valve disorder, unspecified: Secondary | ICD-10-CM | POA: Diagnosis not present

## 2020-01-06 DIAGNOSIS — I10 Essential (primary) hypertension: Secondary | ICD-10-CM | POA: Diagnosis not present

## 2020-01-06 DIAGNOSIS — U071 COVID-19: Secondary | ICD-10-CM | POA: Diagnosis not present

## 2020-01-06 DIAGNOSIS — D649 Anemia, unspecified: Secondary | ICD-10-CM | POA: Diagnosis not present

## 2020-01-08 DIAGNOSIS — R2681 Unsteadiness on feet: Secondary | ICD-10-CM | POA: Diagnosis not present

## 2020-01-08 DIAGNOSIS — J1282 Pneumonia due to coronavirus disease 2019: Secondary | ICD-10-CM | POA: Diagnosis not present

## 2020-01-08 DIAGNOSIS — I10 Essential (primary) hypertension: Secondary | ICD-10-CM | POA: Diagnosis not present

## 2020-01-08 DIAGNOSIS — U071 COVID-19: Secondary | ICD-10-CM | POA: Diagnosis not present

## 2020-01-08 DIAGNOSIS — J9600 Acute respiratory failure, unspecified whether with hypoxia or hypercapnia: Secondary | ICD-10-CM | POA: Diagnosis not present

## 2020-01-08 DIAGNOSIS — F0390 Unspecified dementia without behavioral disturbance: Secondary | ICD-10-CM | POA: Diagnosis not present

## 2020-01-08 DIAGNOSIS — I480 Paroxysmal atrial fibrillation: Secondary | ICD-10-CM | POA: Diagnosis not present

## 2020-01-08 DIAGNOSIS — I359 Nonrheumatic aortic valve disorder, unspecified: Secondary | ICD-10-CM | POA: Diagnosis not present

## 2020-01-08 DIAGNOSIS — E782 Mixed hyperlipidemia: Secondary | ICD-10-CM | POA: Diagnosis not present

## 2020-01-08 DIAGNOSIS — M6281 Muscle weakness (generalized): Secondary | ICD-10-CM | POA: Diagnosis not present

## 2020-01-08 DIAGNOSIS — F329 Major depressive disorder, single episode, unspecified: Secondary | ICD-10-CM | POA: Diagnosis not present

## 2020-01-08 DIAGNOSIS — D649 Anemia, unspecified: Secondary | ICD-10-CM | POA: Diagnosis not present

## 2020-01-08 DIAGNOSIS — I4891 Unspecified atrial fibrillation: Secondary | ICD-10-CM | POA: Diagnosis not present

## 2020-01-08 DIAGNOSIS — K5901 Slow transit constipation: Secondary | ICD-10-CM | POA: Diagnosis not present

## 2020-01-08 DIAGNOSIS — M069 Rheumatoid arthritis, unspecified: Secondary | ICD-10-CM | POA: Diagnosis not present

## 2020-01-11 ENCOUNTER — Ambulatory Visit: Payer: PPO | Admitting: Podiatry

## 2020-01-11 DIAGNOSIS — J9600 Acute respiratory failure, unspecified whether with hypoxia or hypercapnia: Secondary | ICD-10-CM | POA: Diagnosis not present

## 2020-01-11 DIAGNOSIS — D649 Anemia, unspecified: Secondary | ICD-10-CM | POA: Diagnosis not present

## 2020-01-11 DIAGNOSIS — I359 Nonrheumatic aortic valve disorder, unspecified: Secondary | ICD-10-CM | POA: Diagnosis not present

## 2020-01-11 DIAGNOSIS — M6281 Muscle weakness (generalized): Secondary | ICD-10-CM | POA: Diagnosis not present

## 2020-01-11 DIAGNOSIS — I4891 Unspecified atrial fibrillation: Secondary | ICD-10-CM | POA: Diagnosis not present

## 2020-01-11 DIAGNOSIS — M069 Rheumatoid arthritis, unspecified: Secondary | ICD-10-CM | POA: Diagnosis not present

## 2020-01-11 DIAGNOSIS — E782 Mixed hyperlipidemia: Secondary | ICD-10-CM | POA: Diagnosis not present

## 2020-01-11 DIAGNOSIS — I10 Essential (primary) hypertension: Secondary | ICD-10-CM | POA: Diagnosis not present

## 2020-01-11 DIAGNOSIS — F0390 Unspecified dementia without behavioral disturbance: Secondary | ICD-10-CM | POA: Diagnosis not present

## 2020-01-11 DIAGNOSIS — R2681 Unsteadiness on feet: Secondary | ICD-10-CM | POA: Diagnosis not present

## 2020-01-11 DIAGNOSIS — F329 Major depressive disorder, single episode, unspecified: Secondary | ICD-10-CM | POA: Diagnosis not present

## 2020-01-11 DIAGNOSIS — U071 COVID-19: Secondary | ICD-10-CM | POA: Diagnosis not present

## 2020-01-12 DIAGNOSIS — E7849 Other hyperlipidemia: Secondary | ICD-10-CM | POA: Diagnosis not present

## 2020-01-12 DIAGNOSIS — R7989 Other specified abnormal findings of blood chemistry: Secondary | ICD-10-CM | POA: Diagnosis not present

## 2020-01-12 DIAGNOSIS — F0280 Dementia in other diseases classified elsewhere without behavioral disturbance: Secondary | ICD-10-CM | POA: Diagnosis not present

## 2020-01-12 DIAGNOSIS — E222 Syndrome of inappropriate secretion of antidiuretic hormone: Secondary | ICD-10-CM | POA: Diagnosis not present

## 2020-01-12 DIAGNOSIS — K5901 Slow transit constipation: Secondary | ICD-10-CM | POA: Diagnosis not present

## 2020-01-12 DIAGNOSIS — I10 Essential (primary) hypertension: Secondary | ICD-10-CM | POA: Diagnosis not present

## 2020-01-12 DIAGNOSIS — U071 COVID-19: Secondary | ICD-10-CM | POA: Diagnosis not present

## 2020-01-18 DIAGNOSIS — I10 Essential (primary) hypertension: Secondary | ICD-10-CM | POA: Diagnosis not present

## 2020-01-18 DIAGNOSIS — I350 Nonrheumatic aortic (valve) stenosis: Secondary | ICD-10-CM | POA: Diagnosis not present

## 2020-01-18 DIAGNOSIS — M109 Gout, unspecified: Secondary | ICD-10-CM | POA: Diagnosis not present

## 2020-01-18 DIAGNOSIS — E222 Syndrome of inappropriate secretion of antidiuretic hormone: Secondary | ICD-10-CM | POA: Diagnosis not present

## 2020-01-18 DIAGNOSIS — I480 Paroxysmal atrial fibrillation: Secondary | ICD-10-CM | POA: Diagnosis not present

## 2020-01-18 DIAGNOSIS — G309 Alzheimer's disease, unspecified: Secondary | ICD-10-CM | POA: Diagnosis not present

## 2020-01-18 DIAGNOSIS — S81001D Unspecified open wound, right knee, subsequent encounter: Secondary | ICD-10-CM | POA: Diagnosis not present

## 2020-01-18 DIAGNOSIS — Z9181 History of falling: Secondary | ICD-10-CM | POA: Diagnosis not present

## 2020-01-18 DIAGNOSIS — E785 Hyperlipidemia, unspecified: Secondary | ICD-10-CM | POA: Diagnosis not present

## 2020-01-18 DIAGNOSIS — D649 Anemia, unspecified: Secondary | ICD-10-CM | POA: Diagnosis not present

## 2020-01-18 DIAGNOSIS — F32A Depression, unspecified: Secondary | ICD-10-CM | POA: Diagnosis not present

## 2020-01-18 DIAGNOSIS — L89891 Pressure ulcer of other site, stage 1: Secondary | ICD-10-CM | POA: Diagnosis not present

## 2020-01-18 DIAGNOSIS — R1312 Dysphagia, oropharyngeal phase: Secondary | ICD-10-CM | POA: Diagnosis not present

## 2020-01-18 DIAGNOSIS — J1282 Pneumonia due to coronavirus disease 2019: Secondary | ICD-10-CM | POA: Diagnosis not present

## 2020-01-18 DIAGNOSIS — F0280 Dementia in other diseases classified elsewhere without behavioral disturbance: Secondary | ICD-10-CM | POA: Diagnosis not present

## 2020-01-18 DIAGNOSIS — M1990 Unspecified osteoarthritis, unspecified site: Secondary | ICD-10-CM | POA: Diagnosis not present

## 2020-01-18 DIAGNOSIS — M069 Rheumatoid arthritis, unspecified: Secondary | ICD-10-CM | POA: Diagnosis not present

## 2020-01-18 DIAGNOSIS — U071 COVID-19: Secondary | ICD-10-CM | POA: Diagnosis not present

## 2020-01-20 DIAGNOSIS — E785 Hyperlipidemia, unspecified: Secondary | ICD-10-CM | POA: Diagnosis not present

## 2020-01-20 DIAGNOSIS — F32A Depression, unspecified: Secondary | ICD-10-CM | POA: Diagnosis not present

## 2020-01-20 DIAGNOSIS — I480 Paroxysmal atrial fibrillation: Secondary | ICD-10-CM | POA: Diagnosis not present

## 2020-01-20 DIAGNOSIS — I350 Nonrheumatic aortic (valve) stenosis: Secondary | ICD-10-CM | POA: Diagnosis not present

## 2020-01-20 DIAGNOSIS — G309 Alzheimer's disease, unspecified: Secondary | ICD-10-CM | POA: Diagnosis not present

## 2020-01-20 DIAGNOSIS — S81001D Unspecified open wound, right knee, subsequent encounter: Secondary | ICD-10-CM | POA: Diagnosis not present

## 2020-01-20 DIAGNOSIS — M1990 Unspecified osteoarthritis, unspecified site: Secondary | ICD-10-CM | POA: Diagnosis not present

## 2020-01-20 DIAGNOSIS — E222 Syndrome of inappropriate secretion of antidiuretic hormone: Secondary | ICD-10-CM | POA: Diagnosis not present

## 2020-01-20 DIAGNOSIS — I10 Essential (primary) hypertension: Secondary | ICD-10-CM | POA: Diagnosis not present

## 2020-01-20 DIAGNOSIS — D649 Anemia, unspecified: Secondary | ICD-10-CM | POA: Diagnosis not present

## 2020-01-20 DIAGNOSIS — M069 Rheumatoid arthritis, unspecified: Secondary | ICD-10-CM | POA: Diagnosis not present

## 2020-01-20 DIAGNOSIS — J1282 Pneumonia due to coronavirus disease 2019: Secondary | ICD-10-CM | POA: Diagnosis not present

## 2020-01-20 DIAGNOSIS — M109 Gout, unspecified: Secondary | ICD-10-CM | POA: Diagnosis not present

## 2020-01-20 DIAGNOSIS — Z9181 History of falling: Secondary | ICD-10-CM | POA: Diagnosis not present

## 2020-01-20 DIAGNOSIS — F0280 Dementia in other diseases classified elsewhere without behavioral disturbance: Secondary | ICD-10-CM | POA: Diagnosis not present

## 2020-01-20 DIAGNOSIS — L89891 Pressure ulcer of other site, stage 1: Secondary | ICD-10-CM | POA: Diagnosis not present

## 2020-01-20 DIAGNOSIS — R1312 Dysphagia, oropharyngeal phase: Secondary | ICD-10-CM | POA: Diagnosis not present

## 2020-01-20 DIAGNOSIS — U071 COVID-19: Secondary | ICD-10-CM | POA: Diagnosis not present

## 2020-01-21 DIAGNOSIS — I10 Essential (primary) hypertension: Secondary | ICD-10-CM | POA: Diagnosis not present

## 2020-01-21 DIAGNOSIS — I480 Paroxysmal atrial fibrillation: Secondary | ICD-10-CM | POA: Diagnosis not present

## 2020-01-21 DIAGNOSIS — R0602 Shortness of breath: Secondary | ICD-10-CM | POA: Diagnosis not present

## 2020-01-21 DIAGNOSIS — E782 Mixed hyperlipidemia: Secondary | ICD-10-CM | POA: Diagnosis not present

## 2020-01-21 DIAGNOSIS — R5383 Other fatigue: Secondary | ICD-10-CM | POA: Diagnosis not present

## 2020-01-21 DIAGNOSIS — Z23 Encounter for immunization: Secondary | ICD-10-CM | POA: Diagnosis not present

## 2020-01-26 DIAGNOSIS — I10 Essential (primary) hypertension: Secondary | ICD-10-CM | POA: Diagnosis not present

## 2020-01-26 DIAGNOSIS — J1282 Pneumonia due to coronavirus disease 2019: Secondary | ICD-10-CM | POA: Diagnosis not present

## 2020-01-26 DIAGNOSIS — I480 Paroxysmal atrial fibrillation: Secondary | ICD-10-CM | POA: Diagnosis not present

## 2020-01-27 ENCOUNTER — Encounter: Payer: Self-pay | Admitting: Cardiology

## 2020-01-27 ENCOUNTER — Ambulatory Visit: Payer: PPO | Admitting: Cardiology

## 2020-01-27 ENCOUNTER — Other Ambulatory Visit: Payer: Self-pay

## 2020-01-27 DIAGNOSIS — I350 Nonrheumatic aortic (valve) stenosis: Secondary | ICD-10-CM

## 2020-01-27 DIAGNOSIS — F0390 Unspecified dementia without behavioral disturbance: Secondary | ICD-10-CM

## 2020-01-27 DIAGNOSIS — J9601 Acute respiratory failure with hypoxia: Secondary | ICD-10-CM

## 2020-01-27 DIAGNOSIS — E782 Mixed hyperlipidemia: Secondary | ICD-10-CM | POA: Diagnosis not present

## 2020-01-27 DIAGNOSIS — I10 Essential (primary) hypertension: Secondary | ICD-10-CM | POA: Diagnosis not present

## 2020-01-27 DIAGNOSIS — I480 Paroxysmal atrial fibrillation: Secondary | ICD-10-CM

## 2020-01-27 HISTORY — DX: Unspecified dementia, unspecified severity, without behavioral disturbance, psychotic disturbance, mood disturbance, and anxiety: F03.90

## 2020-01-27 MED ORDER — METOPROLOL TARTRATE 25 MG PO TABS: 12.5000 mg | ORAL_TABLET | Freq: Two times a day (BID) | ORAL | 3 refills | Status: DC

## 2020-01-27 NOTE — Patient Instructions (Signed)
Medication Instructions:  START- Metoprolol 12.5 mg by mouth twice a day  *If you need a refill on your cardiac medications before your next appointment, please call your pharmacy*   Lab Work: None Ordered   Testing/Procedures: None Ordered   Follow-Up: At [facility], you and your health needs are our priority.  As part of our continuing mission to provide you with exceptional heart care, we have created designated Provider Care Teams.  These Care Teams include your primary Cardiologist (physician) and Advanced Practice Providers (APPs -  Physician Assistants and Nurse Practitioners) who all work together to provide you with the care you need, when you need it.  We recommend signing up for the patient portal called "MyChart".  Sign up information is provided on this After Visit Summary.  MyChart is used to connect with patients for Virtual Visits (Telemedicine).  Patients are able to view lab/test results, encounter notes, upcoming appointments, etc.  Non-urgent messages can be sent to your provider as well.   To learn more about what you can do with MyChart, go to [email or web address].    Your next appointment:   3-4 week(s)  The format for your next appointment:   In Person  Provider:   You may see Quay Burow, MD or one of the following Advanced Practice Providers on your designated Care Team:    Kerin Ransom, PA-C  [place], [place]  Coletta Memos, [place]

## 2020-01-27 NOTE — Assessment & Plan Note (Signed)
Echo July 2020

## 2020-01-27 NOTE — Assessment & Plan Note (Signed)
Admitted with CAP secondary to COVID 11/02-11/10/2019

## 2020-01-27 NOTE — Assessment & Plan Note (Addendum)
Recurrent atrial fibrillation in the setting of CO VID pneumonia.  Her rate is somewhat elevated.  In the past she has had problems with baseline bradycardia when in sinus.  Today we'll add low-dose beta-blocker to her diltiazem 240 mg daily. She is not on [facility] secondary to high risk falls

## 2020-01-27 NOTE — Assessment & Plan Note (Signed)
Mild.

## 2020-01-27 NOTE — Progress Notes (Signed)
Cardiology Office Note:    Date:  01/27/2020   ID:  [name] [name], DOB [birth year], MRN [id number]  PCP:  [name] Seashore, MD  Cardiologist:  No primary care provider on file.  Electrophysiologist:  None   Referring MD: [name] Seashore, MD   CC:  Post hospital follow up  History of Present Illness:    [name] [name] is a 86 y.o. female with a hx of PAF documented in 2020, baseline sinus bradycardia, moderate aortic stenosis, essential hypertension, dyslipidemia, and mild dementia.  She has not been on anticoagulation secondary to high risk of falls, she has a history of falls in the past some resulting in prolonged downtime.  Patient was admitted to the hospital with CO VID pneumonia and respiratory failure 12/22/2019 to 12/29/2019.  She was in atrial fibrillation during that hospitalization.  Anticoagulation was again reviewed, and again the recommendation was for aspirin only because of high risk.  The patient was discharged to outpatient rehab at [facility] for 10 days.  She is now back home.  She presents to the office today accompanied by her daughter.  The patient recently saw her primary care provider who was concerned that her heart rate was somewhat fast.  In the office today the patient is in atrial fibrillation with a rate of 115.  She does not appear symptomatic.  The daughter did tell me that her mother doesn't complain about anything but she thinks she has been more short of breath.  Past Medical History:  Diagnosis Date  . Aortic stenosis   . Atrial fibrillation ([place])   . Carotid artery bruit 07/22/2012  . Chest pain    myoview 04/24/07-mild-mod perfusion defect with mild-mod superimposed ischemiain the mid inferior, apicla inferior, basal inferolateral and mid inferolateral regions  . Community acquired pneumonia 01/13/2013  . Dementia ([place]) 01/27/2020  . Edema   . Gallstone   . Generalized osteoarthritis   . HTN (hypertension)   . Hyperlipidemia   .  Hypochloremia 01/13/2013  . Hyponatremia 01/13/2013  . IFG (impaired fasting glucose)   . Mild neurocognitive disorder 02/26/2019  . Nonspecific abnormal electrocardiogram (ECG) (EKG)   . Normocytic anemia 08/26/2018  . Porokeratosis 09/02/2018  . Premature atrial contractions 07/22/2012  . Renal insufficiency 08/26/2018  . Rhabdomyolysis 08/26/2018  . Rheumatoid arthritis ([place]) 01/13/2013  . Vertigo    as late effect of cerebrovascular disease    Past Surgical History:  Procedure Laterality Date  . APPENDECTOMY    . BUNIONECTOMY Bilateral   . cataract surgery Bilateral 05/2016  . gallstones removal    . TONSILLECTOMY      Current Medications: Current Meds  Medication Sig  . albuterol (VENTOLIN HFA) 108 (90 Base) MCG/ACT inhaler Inhale 2 puffs into the lungs every 6 (six) hours as needed for wheezing or shortness of breath.  Marland Kitchen aspirin EC 81 MG tablet Take 81 mg by mouth daily.  Marland Kitchen diltiazem (CARDIZEM CD) 240 MG 24 hr capsule Take 1 capsule (240 mg total) by mouth daily.  Marland Kitchen donepezil (ARICEPT) 10 MG tablet Take 10 mg by mouth at bedtime.  . memantine (NAMENDA) 10 MG tablet Take 1 tablet (10 mg total) by mouth 2 (two) times daily.  . mirabegron ER (MYRBETRIQ) 25 MG TB24 tablet Take 25 mg by mouth daily.  Marland Kitchen oxybutynin (DITROPAN-XL) 10 MG 24 hr tablet Take 10 mg by mouth daily.  . pantoprazole (PROTONIX) 40 MG tablet Take 1 tablet (40 mg total) by mouth daily.  . rosuvastatin (CRESTOR)  20 MG tablet Take 20 mg by mouth daily.  . sertraline (ZOLOFT) 100 MG tablet Take 100 mg by mouth daily.   Marland Kitchen VITAMIN D PO Take 1 capsule by mouth daily.     Allergies:   Codeine   Social History   Socioeconomic History  . Marital status: Divorced    Spouse name: Not on file  . Number of children: 4  . Years of education: 64  . Highest education level: Some college, no degree  Occupational History  . Not on file  Tobacco Use  . Smoking status: Never Smoker  . Smokeless tobacco: Never Used  Vaping  Use  . Vaping Use: Never used  Substance and Sexual Activity  . Alcohol use: No  . Drug use: No  . Sexual activity: Never  Other Topics Concern  . Not on file  Social History Narrative   Lives at home alone   Right handed   Caffeine: sometimes   Social Determinants of Health   Financial Resource Strain:   . Difficulty of Paying Living Expenses: Not on file  Food Insecurity:   . Worried About Charity fundraiser in the Last Year: Not on file  . Ran Out of Food in the Last Year: Not on file  Transportation Needs:   . Lack of Transportation (Medical): Not on file  . Lack of Transportation (Non-Medical): Not on file  Physical Activity:   . Days of Exercise per Week: Not on file  . Minutes of Exercise per Session: Not on file  Stress:   . Feeling of Stress : Not on file  Social Connections:   . Frequency of Communication with Friends and Family: Not on file  . Frequency of Social Gatherings with Friends and Family: Not on file  . Attends Religious Services: Not on file  . Active Member of Clubs or Organizations: Not on file  . Attends Archivist Meetings: Not on file  . Marital Status: Not on file     Family History: The patient's family history includes Alzheimer's disease in her sister; Brain cancer in her niece; Breast cancer in her niece, niece, and sister; Cancer in her sister and sister; Diabetes in her father; Lung cancer in her nephew; Ovarian cancer in her granddaughter; Seizures in her brother, daughter, granddaughter, and son.  ROS:   Please see the history of present illness.     All other systems reviewed and are negative.  EKGs/Labs/Other Studies Reviewed:    The following studies were reviewed today: Echo 08/27/2018- IMPRESSIONS    1. The left ventricle has normal systolic function, with an ejection  fraction of 55-60%. The cavity size was normal. There is moderate  asymmetric left ventricular hypertrophy. Left ventricular diastolic  Doppler  parameters are consistent with  pseudonormalization.  2. The right ventricle has normal systolic function. The cavity was  moderately enlarged. There is no increase in right ventricular wall  thickness. Right ventricular systolic pressure is moderately elevated.  3. Left atrial size was mildly dilated.  4. Right atrial size was moderately dilated.  5. The aortic valve is tricuspid. Severely thickening of the aortic  valve. Severe calcifcation of the aortic valve. Aortic valve regurgitation  is trivial by color flow Doppler. Moderate stenosis of the aortic valve.  AV Area (Vmax): 1.55 cm. AV Mean  Grad: 22.7 mmHg. LVOT/AV VTI ratio: 0.32. AV Vmax: 304.67 cm/s.   EKG:  EKG is ordered today.  The ekg ordered today demonstrates AF with VR  113, one PVC  Recent Labs: 12/24/2019: Magnesium 1.7 12/28/2019: ALT 27; Hemoglobin 10.6; Platelets 498 12/29/2019: BUN 27; Creatinine, Ser 1.01; Potassium 3.8; Sodium 129  Recent Lipid Panel    Component Value Date/Time   CHOL  04/03/2007 0405    179        ATP III CLASSIFICATION:  <200     mg/dL   Desirable  200-239  mg/dL   Borderline High  >=240    mg/dL   High   TRIG 71 12/23/2019 0956   HDL 46 04/03/2007 0405   CHOLHDL 3.9 04/03/2007 0405   VLDL 16 04/03/2007 0405   LDLCALC (H) 04/03/2007 0405    117        Total Cholesterol/HDL:CHD Risk Coronary Heart Disease Risk Table                     Men   Women  1/2 Average Risk   3.4   3.3    Physical Exam:    VS:  BP 138/78   Pulse (!) 117   Ht 5\' 6"  (1.676 m)   Wt 177 lb (80.3 kg)   BMI 28.57 kg/m     Wt Readings from Last 3 Encounters:  01/27/20 177 lb (80.3 kg)  12/23/19 183 lb (83 kg)  10/28/19 186 lb 3.2 oz (84.5 kg)     GEN: Elderly AA female,  well developed in no acute distress HEENT: Normal NECK: No JVD; No carotid bruits CARDIAC: Irregularly irregular with a 2/6 systolic murmur at the aortic valve area and left sternal border RESPIRATORY:  Fine bilateral velcro  crackles ABDOMEN: Soft, non-tender, non-distended MUSCULOSKELETAL:  No edema; No deformity  SKIN: Warm and dry NEUROLOGIC:  Alert and oriented x 3 PSYCHIATRIC:  Normal affect   ASSESSMENT:    PAF (paroxysmal atrial fibrillation) (HCC) Recurrent atrial fibrillation in the setting of CO VID pneumonia.  Her rate is somewhat elevated.  In the past she has had problems with baseline bradycardia when in sinus.  Today we'll add low-dose beta-blocker to her diltiazem 240 mg daily. She is not on [facility] secondary to high risk falls  Moderate aortic stenosis Echo July 2020  Acute hypoxemic respiratory failure ([place]) Admitted with CAP secondary to COVID 11/02-11/10/2019  Dementia ([place]) Mild  PLAN:    Add Metoprolol 12.5 mg BID.  Her daughter knows to monitor the patient's heart rate at home- the patient has a pulse Ox.  Office f/u 3-4 weeks.   Medication Adjustments/Labs and Tests Ordered: Current medicines are reviewed at length with the patient today.  Concerns regarding medicines are outlined above.  Orders Placed This Encounter  Procedures  . EKG 12-Lead   Meds ordered this encounter  Medications  . metoprolol tartrate (LOPRESSOR) 25 MG tablet    Sig: Take 0.5 tablets (12.5 mg total) by mouth 2 (two) times daily.    Dispense:  90 tablet    Refill:  3    Patient Instructions  Medication Instructions:  START- Metoprolol 12.5 mg by mouth twice a day  *If you need a refill on your cardiac medications before your next appointment, please call your pharmacy*   Lab Work: None Ordered   Testing/Procedures: None Ordered   Follow-Up: At [facility], you and your health needs are our priority.  As part of our continuing mission to provide you with exceptional heart care, we have created designated Provider Care Teams.  These Care Teams include your primary Cardiologist (physician) and Advanced Practice Providers (  APPs -  Physician Assistants and Nurse Practitioners) who all work  together to provide you with the care you need, when you need it.  We recommend signing up for the patient portal called "MyChart".  Sign up information is provided on this After Visit Summary.  MyChart is used to connect with patients for Virtual Visits (Telemedicine).  Patients are able to view lab/test results, encounter notes, upcoming appointments, etc.  Non-urgent messages can be sent to your provider as well.   To learn more about what you can do with MyChart, go to [email or web address].    Your next appointment:   3-4 week(s)  The format for your next appointment:   In Person  Provider:   You may see Quay Burow, MD or one of the following Advanced Practice Providers on your designated Care Team:    Kerin Ransom, PA-C  [place], [place]  Coletta Memos, FNP       Signed, Kerin Ransom, [place]  01/27/2020 8:58 AM    [place]

## 2020-01-28 DIAGNOSIS — I480 Paroxysmal atrial fibrillation: Secondary | ICD-10-CM | POA: Diagnosis not present

## 2020-01-28 DIAGNOSIS — M069 Rheumatoid arthritis, unspecified: Secondary | ICD-10-CM | POA: Diagnosis not present

## 2020-01-28 DIAGNOSIS — F32A Depression, unspecified: Secondary | ICD-10-CM | POA: Diagnosis not present

## 2020-01-28 DIAGNOSIS — Z9181 History of falling: Secondary | ICD-10-CM | POA: Diagnosis not present

## 2020-01-28 DIAGNOSIS — U071 COVID-19: Secondary | ICD-10-CM | POA: Diagnosis not present

## 2020-01-28 DIAGNOSIS — J1282 Pneumonia due to coronavirus disease 2019: Secondary | ICD-10-CM | POA: Diagnosis not present

## 2020-01-28 DIAGNOSIS — S81001D Unspecified open wound, right knee, subsequent encounter: Secondary | ICD-10-CM | POA: Diagnosis not present

## 2020-01-28 DIAGNOSIS — I10 Essential (primary) hypertension: Secondary | ICD-10-CM | POA: Diagnosis not present

## 2020-01-28 DIAGNOSIS — E222 Syndrome of inappropriate secretion of antidiuretic hormone: Secondary | ICD-10-CM | POA: Diagnosis not present

## 2020-01-28 DIAGNOSIS — I350 Nonrheumatic aortic (valve) stenosis: Secondary | ICD-10-CM | POA: Diagnosis not present

## 2020-01-28 DIAGNOSIS — F0280 Dementia in other diseases classified elsewhere without behavioral disturbance: Secondary | ICD-10-CM | POA: Diagnosis not present

## 2020-01-28 DIAGNOSIS — M1990 Unspecified osteoarthritis, unspecified site: Secondary | ICD-10-CM | POA: Diagnosis not present

## 2020-01-28 DIAGNOSIS — D649 Anemia, unspecified: Secondary | ICD-10-CM | POA: Diagnosis not present

## 2020-01-28 DIAGNOSIS — R1312 Dysphagia, oropharyngeal phase: Secondary | ICD-10-CM | POA: Diagnosis not present

## 2020-01-28 DIAGNOSIS — E785 Hyperlipidemia, unspecified: Secondary | ICD-10-CM | POA: Diagnosis not present

## 2020-01-28 DIAGNOSIS — G309 Alzheimer's disease, unspecified: Secondary | ICD-10-CM | POA: Diagnosis not present

## 2020-01-28 DIAGNOSIS — L89891 Pressure ulcer of other site, stage 1: Secondary | ICD-10-CM | POA: Diagnosis not present

## 2020-01-28 DIAGNOSIS — M109 Gout, unspecified: Secondary | ICD-10-CM | POA: Diagnosis not present

## 2020-02-02 ENCOUNTER — Ambulatory Visit: Payer: PPO | Admitting: Podiatry

## 2020-02-02 ENCOUNTER — Other Ambulatory Visit: Payer: Self-pay

## 2020-02-02 DIAGNOSIS — B351 Tinea unguium: Secondary | ICD-10-CM

## 2020-02-02 DIAGNOSIS — M79675 Pain in left toe(s): Secondary | ICD-10-CM

## 2020-02-02 DIAGNOSIS — Q828 Other specified congenital malformations of skin: Secondary | ICD-10-CM | POA: Diagnosis not present

## 2020-02-02 DIAGNOSIS — M79674 Pain in right toe(s): Secondary | ICD-10-CM

## 2020-02-03 DIAGNOSIS — I350 Nonrheumatic aortic (valve) stenosis: Secondary | ICD-10-CM | POA: Diagnosis not present

## 2020-02-03 DIAGNOSIS — G309 Alzheimer's disease, unspecified: Secondary | ICD-10-CM | POA: Diagnosis not present

## 2020-02-03 DIAGNOSIS — F0280 Dementia in other diseases classified elsewhere without behavioral disturbance: Secondary | ICD-10-CM | POA: Diagnosis not present

## 2020-02-03 DIAGNOSIS — L89891 Pressure ulcer of other site, stage 1: Secondary | ICD-10-CM | POA: Diagnosis not present

## 2020-02-03 DIAGNOSIS — U071 COVID-19: Secondary | ICD-10-CM | POA: Diagnosis not present

## 2020-02-03 DIAGNOSIS — F32A Depression, unspecified: Secondary | ICD-10-CM | POA: Diagnosis not present

## 2020-02-03 DIAGNOSIS — M069 Rheumatoid arthritis, unspecified: Secondary | ICD-10-CM | POA: Diagnosis not present

## 2020-02-03 DIAGNOSIS — D649 Anemia, unspecified: Secondary | ICD-10-CM | POA: Diagnosis not present

## 2020-02-03 DIAGNOSIS — E785 Hyperlipidemia, unspecified: Secondary | ICD-10-CM | POA: Diagnosis not present

## 2020-02-03 DIAGNOSIS — I480 Paroxysmal atrial fibrillation: Secondary | ICD-10-CM | POA: Diagnosis not present

## 2020-02-03 DIAGNOSIS — S81001D Unspecified open wound, right knee, subsequent encounter: Secondary | ICD-10-CM | POA: Diagnosis not present

## 2020-02-03 DIAGNOSIS — J1282 Pneumonia due to coronavirus disease 2019: Secondary | ICD-10-CM | POA: Diagnosis not present

## 2020-02-03 DIAGNOSIS — M1990 Unspecified osteoarthritis, unspecified site: Secondary | ICD-10-CM | POA: Diagnosis not present

## 2020-02-03 DIAGNOSIS — Z9181 History of falling: Secondary | ICD-10-CM | POA: Diagnosis not present

## 2020-02-03 DIAGNOSIS — E222 Syndrome of inappropriate secretion of antidiuretic hormone: Secondary | ICD-10-CM | POA: Diagnosis not present

## 2020-02-03 DIAGNOSIS — I10 Essential (primary) hypertension: Secondary | ICD-10-CM | POA: Diagnosis not present

## 2020-02-03 DIAGNOSIS — R1312 Dysphagia, oropharyngeal phase: Secondary | ICD-10-CM | POA: Diagnosis not present

## 2020-02-03 DIAGNOSIS — M109 Gout, unspecified: Secondary | ICD-10-CM | POA: Diagnosis not present

## 2020-02-03 NOTE — Progress Notes (Signed)
Subjective: 86 y.o. returns the office today for painful, elongated, thickened toenails which they cannot trim herself and for painful calluses to both feet. Denies any redness or drainage or swelling to the nail or callus sites. The calluses are hurting but not as much as normal. No other concerns today.Denies any systemic complaints such as fevers, chills, nausea, vomiting.   Objective: AAO 3, NAD DP/PT pulses palpable, CRT less than 3 seconds Nails hypertrophic, dystrophic, elongated, brittle, discolored 10. There is tenderness overlying the nails 1-5 bilaterally. There is no surrounding erythema or drainage along the nail sites. Hyperkeratotic lesion to the plantar aspect of the right foot submetatarsal 1 and along the distal 3rd toes. There is still dried blood under the calluses however much improved but there is no open lesions.  No open lesions or pre-ulcerative lesions are identified. No pain with calf compression, swelling, warmth, erythema.  Assessment: Patient presents with symptomatic onychomycosis; hyperkeratotic lesions  Plan: -Treatment options including alternatives, risks, complications were discussed -Nails sharply debrided 10 without complication/bleeding. -Hyperkeratotic lesion sharply debrided x2 without complications or bleeding.  Continue moisturizer and offloading. -Discussed daily foot inspection. If there are any changes, to call the office immediately.  -Follow-up in as scheduled or sooner if any problems are to arise. In the meantime, encouraged to call the office with any questions, concerns, changes symptoms.  Celesta Gentile, DPM

## 2020-02-04 DIAGNOSIS — E785 Hyperlipidemia, unspecified: Secondary | ICD-10-CM | POA: Diagnosis not present

## 2020-02-04 DIAGNOSIS — Z9181 History of falling: Secondary | ICD-10-CM | POA: Diagnosis not present

## 2020-02-04 DIAGNOSIS — S81001D Unspecified open wound, right knee, subsequent encounter: Secondary | ICD-10-CM | POA: Diagnosis not present

## 2020-02-04 DIAGNOSIS — F0280 Dementia in other diseases classified elsewhere without behavioral disturbance: Secondary | ICD-10-CM | POA: Diagnosis not present

## 2020-02-04 DIAGNOSIS — J1282 Pneumonia due to coronavirus disease 2019: Secondary | ICD-10-CM | POA: Diagnosis not present

## 2020-02-04 DIAGNOSIS — I480 Paroxysmal atrial fibrillation: Secondary | ICD-10-CM | POA: Diagnosis not present

## 2020-02-04 DIAGNOSIS — M109 Gout, unspecified: Secondary | ICD-10-CM | POA: Diagnosis not present

## 2020-02-04 DIAGNOSIS — I10 Essential (primary) hypertension: Secondary | ICD-10-CM | POA: Diagnosis not present

## 2020-02-04 DIAGNOSIS — M1990 Unspecified osteoarthritis, unspecified site: Secondary | ICD-10-CM | POA: Diagnosis not present

## 2020-02-04 DIAGNOSIS — D649 Anemia, unspecified: Secondary | ICD-10-CM | POA: Diagnosis not present

## 2020-02-04 DIAGNOSIS — E222 Syndrome of inappropriate secretion of antidiuretic hormone: Secondary | ICD-10-CM | POA: Diagnosis not present

## 2020-02-04 DIAGNOSIS — U071 COVID-19: Secondary | ICD-10-CM | POA: Diagnosis not present

## 2020-02-04 DIAGNOSIS — M069 Rheumatoid arthritis, unspecified: Secondary | ICD-10-CM | POA: Diagnosis not present

## 2020-02-04 DIAGNOSIS — I350 Nonrheumatic aortic (valve) stenosis: Secondary | ICD-10-CM | POA: Diagnosis not present

## 2020-02-04 DIAGNOSIS — G309 Alzheimer's disease, unspecified: Secondary | ICD-10-CM | POA: Diagnosis not present

## 2020-02-04 DIAGNOSIS — F32A Depression, unspecified: Secondary | ICD-10-CM | POA: Diagnosis not present

## 2020-02-04 DIAGNOSIS — L89891 Pressure ulcer of other site, stage 1: Secondary | ICD-10-CM | POA: Diagnosis not present

## 2020-02-04 DIAGNOSIS — R1312 Dysphagia, oropharyngeal phase: Secondary | ICD-10-CM | POA: Diagnosis not present

## 2020-02-22 DIAGNOSIS — S81001D Unspecified open wound, right knee, subsequent encounter: Secondary | ICD-10-CM | POA: Diagnosis not present

## 2020-02-22 DIAGNOSIS — M109 Gout, unspecified: Secondary | ICD-10-CM | POA: Diagnosis not present

## 2020-02-22 DIAGNOSIS — F32A Depression, unspecified: Secondary | ICD-10-CM | POA: Diagnosis not present

## 2020-02-22 DIAGNOSIS — I480 Paroxysmal atrial fibrillation: Secondary | ICD-10-CM | POA: Diagnosis not present

## 2020-02-22 DIAGNOSIS — Z9181 History of falling: Secondary | ICD-10-CM | POA: Diagnosis not present

## 2020-02-22 DIAGNOSIS — U071 COVID-19: Secondary | ICD-10-CM | POA: Diagnosis not present

## 2020-02-22 DIAGNOSIS — J1282 Pneumonia due to coronavirus disease 2019: Secondary | ICD-10-CM | POA: Diagnosis not present

## 2020-02-22 DIAGNOSIS — G309 Alzheimer's disease, unspecified: Secondary | ICD-10-CM | POA: Diagnosis not present

## 2020-02-22 DIAGNOSIS — E222 Syndrome of inappropriate secretion of antidiuretic hormone: Secondary | ICD-10-CM | POA: Diagnosis not present

## 2020-02-22 DIAGNOSIS — M069 Rheumatoid arthritis, unspecified: Secondary | ICD-10-CM | POA: Diagnosis not present

## 2020-02-22 DIAGNOSIS — R1312 Dysphagia, oropharyngeal phase: Secondary | ICD-10-CM | POA: Diagnosis not present

## 2020-02-22 DIAGNOSIS — F0280 Dementia in other diseases classified elsewhere without behavioral disturbance: Secondary | ICD-10-CM | POA: Diagnosis not present

## 2020-02-22 DIAGNOSIS — E785 Hyperlipidemia, unspecified: Secondary | ICD-10-CM | POA: Diagnosis not present

## 2020-02-22 DIAGNOSIS — M1990 Unspecified osteoarthritis, unspecified site: Secondary | ICD-10-CM | POA: Diagnosis not present

## 2020-02-22 DIAGNOSIS — I10 Essential (primary) hypertension: Secondary | ICD-10-CM | POA: Diagnosis not present

## 2020-02-22 DIAGNOSIS — L89891 Pressure ulcer of other site, stage 1: Secondary | ICD-10-CM | POA: Diagnosis not present

## 2020-02-22 DIAGNOSIS — I350 Nonrheumatic aortic (valve) stenosis: Secondary | ICD-10-CM | POA: Diagnosis not present

## 2020-02-22 DIAGNOSIS — D649 Anemia, unspecified: Secondary | ICD-10-CM | POA: Diagnosis not present

## 2020-02-23 NOTE — Progress Notes (Signed)
Cardiology Clinic Note   Patient Name: [name] [name] Date of Encounter: 02/25/2020  Primary Care Provider:  Merrilee Seashore, MD Primary Cardiologist:  Quay Burow, MD  Patient Profile    [name] [name] 87 year old female presents the clinic today for follow-up evaluation of her paroxysmal atrial fibrillation.  Past Medical History    Past Medical History:  Diagnosis Date  . Aortic stenosis   . Atrial fibrillation ([place])   . Carotid artery bruit 07/22/2012  . Chest pain    myoview 04/24/07-mild-mod perfusion defect with mild-mod superimposed ischemiain the mid inferior, apicla inferior, basal inferolateral and mid inferolateral regions  . Community acquired pneumonia 01/13/2013  . Dementia ([place]) 01/27/2020  . Edema   . Gallstone   . Generalized osteoarthritis   . HTN (hypertension)   . Hyperlipidemia   . Hypochloremia 01/13/2013  . Hyponatremia 01/13/2013  . IFG (impaired fasting glucose)   . Mild neurocognitive disorder 02/26/2019  . Nonspecific abnormal electrocardiogram (ECG) (EKG)   . Normocytic anemia 08/26/2018  . Porokeratosis 09/02/2018  . Premature atrial contractions 07/22/2012  . Renal insufficiency 08/26/2018  . Rhabdomyolysis 08/26/2018  . Rheumatoid arthritis ([place]) 01/13/2013  . Vertigo    as late effect of cerebrovascular disease   Past Surgical History:  Procedure Laterality Date  . APPENDECTOMY    . BUNIONECTOMY Bilateral   . cataract surgery Bilateral 05/2016  . gallstones removal    . TONSILLECTOMY      Allergies  Allergies  Allergen Reactions  . Codeine Nausea And Vomiting and Other (See Comments)    Dizziness    History of Present Illness    [name] [name] has a PMH of paroxysmal atrial fibrillation (had recurrent atrial fibrillation in setting of Covid pneumonia), moderate aortic stenosis, hypoxic respiratory failure, mild dementia, carotid artery disease, HTN, and HLD.  She was last seen by Kerin Ransom PA-C on 01/27/2020.  She was being  seen as a post hospital follow-up after her admission for Covid pneumonia 12/22/2019-12/29/2019.  She had gone into atrial fibrillation during her hospitalization.  She was not started on anticoagulation due to high fall risk but was continued on daily aspirin.  She was discharged to [facility] for outpatient rehab and had returned home when she presented for follow-up.  Her heart rate was elevated to 117 on follow-up.  She was continued on her diltiazem and started on metoprolol tartrate 12.5 twice daily.  She presents the clinic today for follow-up evaluation states she feels well.  She presents to the visit today with her daughter.  Her daughter indicates that she has not been very physically active.  She has been seeing physical therapy once per week and is doing activities like chair yoga.  She has not noticed any increased heart rate or shortness of breath.  She denies increased fatigue.  She does however express that she is worried about falls.  We reviewed chair type exercises for balance and strength.  I will have her continue her current medications give her the salty 6 diet sheet, have her increase her physical activity as tolerated and follow-up with Dr. Gwenlyn Found or myself in 3 months.  Today she denies chest pain, shortness of breath, lower extremity edema, fatigue, palpitations, melena, hematuria, hemoptysis, diaphoresis, weakness, presyncope, syncope, orthopnea, and PND.   Home Medications    Prior to Admission medications   Medication Sig Start Date End Date Taking? Authorizing Provider  albuterol (VENTOLIN HFA) 108 (90 Base) MCG/ACT inhaler Inhale 2 puffs into the lungs  every 6 (six) hours as needed for wheezing or shortness of breath. 12/29/19   Elgergawy, Silver Huguenin, MD  aspirin EC 81 MG tablet Take 81 mg by mouth daily.    [provider]  diltiazem (CARDIZEM CD) 240 MG 24 hr capsule Take 1 capsule (240 mg total) by mouth daily. 12/30/19   Elgergawy, Silver Huguenin, MD  donepezil  (ARICEPT) 10 MG tablet Take 10 mg by mouth at bedtime. 12/04/17   [provider]  memantine (NAMENDA) 10 MG tablet Take 1 tablet (10 mg total) by mouth 2 (two) times daily. 10/28/19   Lomax, Amy, NP  metoprolol tartrate (LOPRESSOR) 25 MG tablet Take 0.5 tablets (12.5 mg total) by mouth 2 (two) times daily. 01/27/20 04/26/20  Erlene Quan, PA-C  mirabegron ER (MYRBETRIQ) 25 MG TB24 tablet Take 25 mg by mouth daily.    [provider]  oxybutynin (DITROPAN-XL) 10 MG 24 hr tablet Take 10 mg by mouth daily. 01/19/19   [provider]  pantoprazole (PROTONIX) 40 MG tablet Take 1 tablet (40 mg total) by mouth daily. 12/30/19   Elgergawy, Silver Huguenin, MD  rosuvastatin (CRESTOR) 20 MG tablet Take 20 mg by mouth daily.    [provider]  sertraline (ZOLOFT) 100 MG tablet Take 100 mg by mouth daily.  12/04/17   [provider]  VITAMIN D PO Take 1 capsule by mouth daily.    [provider]    Family History    Family History  Problem Relation Age of Onset  . Diabetes Father   . Seizures Brother   . Alzheimer's disease Sister   . Cancer Sister   . Breast cancer Sister   . Cancer Sister   . Seizures Daughter   . Seizures Son   . Seizures Granddaughter   . Ovarian cancer Granddaughter   . Lung cancer Nephew   . Breast cancer Niece   . Breast cancer Niece   . Brain cancer Niece    She indicated that her mother is deceased. She indicated that her father is deceased. She indicated that only one of her three sisters is alive. She indicated that her brother is deceased. She indicated that her maternal grandmother is deceased. She indicated that her maternal grandfather is deceased. She indicated that her paternal grandmother is deceased. She indicated that her paternal grandfather is deceased. She indicated that her daughter is alive. She indicated that her son is alive. She indicated that her granddaughter is deceased. She indicated that her nephew is  alive. She indicated that both of her nieces are deceased.  Social History    Social History   Socioeconomic History  . Marital status: Divorced    Spouse name: Not on file  . Number of children: 4  . Years of education: 21  . Highest education level: Some college, no degree  Occupational History  . Not on file  Tobacco Use  . Smoking status: Never Smoker  . Smokeless tobacco: Never Used  Vaping Use  . Vaping Use: Never used  Substance and Sexual Activity  . Alcohol use: No  . Drug use: No  . Sexual activity: Never  Other Topics Concern  . Not on file  Social History Narrative   Lives at home alone   Right handed   Caffeine: sometimes   Social Determinants of Health   Financial Resource Strain: Not on file  Food Insecurity: Not on file  Transportation Needs: Not on file  Physical Activity: Not on  file  Stress: Not on file  Social Connections: Not on file  Intimate Partner Violence: Not on file     Review of Systems    General:  No chills, fever, night sweats or weight changes.  Cardiovascular:  No chest pain, dyspnea on exertion, edema, orthopnea, palpitations, paroxysmal nocturnal dyspnea. Dermatological: No rash, lesions/masses Respiratory: No cough, dyspnea Urologic: No hematuria, dysuria Abdominal:   No nausea, vomiting, diarrhea, bright red blood per rectum, melena, or hematemesis Neurologic:  No visual changes, wkns, changes in mental status. All other systems reviewed and are otherwise negative except as noted above.  Physical Exam    VS:  BP 128/72   Pulse (!) 51   Ht 5\' 6"  (1.676 m)   Wt 166 lb 12.8 oz (75.7 kg)   SpO2 98%   BMI 26.92 kg/m  , BMI Body mass index is 26.92 kg/m. GEN: Well nourished, well developed, in no acute distress. HEENT: normal. Neck: Supple, no JVD, carotid bruits, or masses. Cardiac: RRR, no murmurs, rubs, or gallops. No clubbing, cyanosis, edema.  Radials/DP/PT 2+ and equal bilaterally.  Respiratory:  Respirations  regular and unlabored, clear to auscultation bilaterally. GI: Soft, nontender, nondistended, BS + x 4. MS: no deformity or atrophy. Skin: warm and dry, no rash. Neuro:  Strength and sensation are intact. Psych: Normal affect.  Accessory Clinical Findings    Recent Labs: 12/24/2019: Magnesium 1.7 12/28/2019: ALT 27; Hemoglobin 10.6; Platelets 498 12/29/2019: BUN 27; Creatinine, Ser 1.01; Potassium 3.8; Sodium 129   Recent Lipid Panel    Component Value Date/Time   CHOL  04/03/2007 0405    179        ATP III CLASSIFICATION:  <200     mg/dL   Desirable  200-239  mg/dL   Borderline High  >=240    mg/dL   High   TRIG 71 12/23/2019 0956   HDL 46 04/03/2007 0405   CHOLHDL 3.9 04/03/2007 0405   VLDL 16 04/03/2007 0405   LDLCALC (H) 04/03/2007 0405    117        Total Cholesterol/HDL:CHD Risk Coronary Heart Disease Risk Table                     Men   Women  1/2 Average Risk   3.4   3.3    ECG personally reviewed by me today-none today.  EKG 01/27/2020 Atrial fibrillation with RVR with prematurely ventricular or aberrantly conducted complexes 113 bpm  Echocardiogram 08/27/2018 IMPRESSIONS    1. The left ventricle has normal systolic function, with an ejection  fraction of 55-60%. The cavity size was normal. There is moderate  asymmetric left ventricular hypertrophy. Left ventricular diastolic  Doppler parameters are consistent with  pseudonormalization.  2. The right ventricle has normal systolic function. The cavity was  moderately enlarged. There is no increase in right ventricular wall  thickness. Right ventricular systolic pressure is moderately elevated.  3. Left atrial size was mildly dilated.  4. Right atrial size was moderately dilated.  5. The aortic valve is tricuspid. Severely thickening of the aortic  valve. Severe calcifcation of the aortic valve. Aortic valve regurgitation  is trivial by color flow Doppler. Moderate stenosis of the aortic valve.  AV Area  (Vmax): 1.55 cm. AV Mean  Grad: 22.7 mmHg. LVOT/AV VTI ratio: 0.32. AV Vmax: 304.67 cm/s.  Assessment & Plan   1.  Paroxysmal atrial fibrillation-heart rate today 51 BPM.   Recurrent atrial fibrillation in the setting of  Covid pneumonia.  Heart rate much better controlled with addition of metoprolol tartrate.  Tolerated medication well.  Baseline heart rate previously sinus bradycardia. Continue diltiazem, metoprolol Heart healthy low-sodium diet-salty 6 given Increase physical activity as tolerated Avoid triggers alcohol, caffeine, chocolate, dehydration etc.  Moderate aortic stenosis-no increased DOE or activity intolerance.  Echocardiogram 7/20 showed moderate stenosis. Repeat echocardiogram when clinically indicated.  Essential hypertension-BP today 128/72.  Well-controlled at home. Continue metoprolol, diltiazem Heart healthy low-sodium diet-salty 6 given Increase physical activity as tolerated  Mild dementia-stable, able to answer questions and interact well during exam/clinic visit. Continue Aricept, [place] with PCP  Disposition: Follow-up with Dr. Gwenlyn Found in 3 months.  Jossie Ng. Zhana Jeangilles NP-C    02/25/2020, 8:59 AM [address] [place] [address] Office [phone] Fax [phone]  Notice: This dictation was prepared with Dragon dictation along with smaller phrase technology. Any transcriptional errors that result from this process are unintentional and may not be corrected upon review.

## 2020-02-25 ENCOUNTER — Encounter: Payer: Self-pay | Admitting: General Practice

## 2020-02-25 ENCOUNTER — Other Ambulatory Visit: Payer: Self-pay

## 2020-02-25 ENCOUNTER — Ambulatory Visit: Payer: PPO | Admitting: General Practice

## 2020-02-25 VITALS — BP 128/72 | HR 51 | Ht 66.0 in | Wt 166.8 lb

## 2020-02-25 DIAGNOSIS — I10 Essential (primary) hypertension: Secondary | ICD-10-CM | POA: Diagnosis not present

## 2020-02-25 DIAGNOSIS — Z9181 History of falling: Secondary | ICD-10-CM | POA: Diagnosis not present

## 2020-02-25 DIAGNOSIS — J1282 Pneumonia due to coronavirus disease 2019: Secondary | ICD-10-CM | POA: Diagnosis not present

## 2020-02-25 DIAGNOSIS — L89891 Pressure ulcer of other site, stage 1: Secondary | ICD-10-CM | POA: Diagnosis not present

## 2020-02-25 DIAGNOSIS — F32A Depression, unspecified: Secondary | ICD-10-CM | POA: Diagnosis not present

## 2020-02-25 DIAGNOSIS — I480 Paroxysmal atrial fibrillation: Secondary | ICD-10-CM

## 2020-02-25 DIAGNOSIS — I350 Nonrheumatic aortic (valve) stenosis: Secondary | ICD-10-CM | POA: Diagnosis not present

## 2020-02-25 DIAGNOSIS — U071 COVID-19: Secondary | ICD-10-CM | POA: Diagnosis not present

## 2020-02-25 DIAGNOSIS — G309 Alzheimer's disease, unspecified: Secondary | ICD-10-CM | POA: Diagnosis not present

## 2020-02-25 DIAGNOSIS — M109 Gout, unspecified: Secondary | ICD-10-CM | POA: Diagnosis not present

## 2020-02-25 DIAGNOSIS — R7301 Impaired fasting glucose: Secondary | ICD-10-CM | POA: Diagnosis not present

## 2020-02-25 DIAGNOSIS — E785 Hyperlipidemia, unspecified: Secondary | ICD-10-CM | POA: Diagnosis not present

## 2020-02-25 DIAGNOSIS — D649 Anemia, unspecified: Secondary | ICD-10-CM | POA: Diagnosis not present

## 2020-02-25 DIAGNOSIS — M6281 Muscle weakness (generalized): Secondary | ICD-10-CM | POA: Diagnosis not present

## 2020-02-25 DIAGNOSIS — E222 Syndrome of inappropriate secretion of antidiuretic hormone: Secondary | ICD-10-CM | POA: Diagnosis not present

## 2020-02-25 DIAGNOSIS — S81001D Unspecified open wound, right knee, subsequent encounter: Secondary | ICD-10-CM | POA: Diagnosis not present

## 2020-02-25 DIAGNOSIS — F0390 Unspecified dementia without behavioral disturbance: Secondary | ICD-10-CM | POA: Diagnosis not present

## 2020-02-25 DIAGNOSIS — M1990 Unspecified osteoarthritis, unspecified site: Secondary | ICD-10-CM | POA: Diagnosis not present

## 2020-02-25 DIAGNOSIS — R1312 Dysphagia, oropharyngeal phase: Secondary | ICD-10-CM | POA: Diagnosis not present

## 2020-02-25 DIAGNOSIS — F0280 Dementia in other diseases classified elsewhere without behavioral disturbance: Secondary | ICD-10-CM | POA: Diagnosis not present

## 2020-02-25 DIAGNOSIS — E782 Mixed hyperlipidemia: Secondary | ICD-10-CM | POA: Diagnosis not present

## 2020-02-25 DIAGNOSIS — M069 Rheumatoid arthritis, unspecified: Secondary | ICD-10-CM | POA: Diagnosis not present

## 2020-02-25 NOTE — Patient Instructions (Signed)
Medication Instructions:  The current medical regimen is effective;  continue present plan and medications as directed. Please refer to the Current Medication list given to you today.  *If you need a refill on your cardiac medications before your next appointment, please call your pharmacy*  Lab Work:   Testing/Procedures:  NONE    NONE  Special Instructions  PLEASE INCREASE PHYSICAL ACTIVITY AS TOLERATED-DO CHAIR AND BALANCE ACTIVITIES  Follow-Up: Your next appointment:  3 month(s) In Person with Quay Burow, MD OR IF UNAVAILABLE [place], FNP-C   At [facility], you and your health needs are our priority.  As part of our continuing mission to provide you with exceptional heart care, we have created designated Provider Care Teams.  These Care Teams include your primary Cardiologist (physician) and Advanced Practice Providers (APPs -  Physician Assistants and Nurse Practitioners) who all work together to provide you with the care you need, when you need it.  We recommend signing up for the patient portal called "MyChart".  Sign up information is provided on this After Visit Summary.  MyChart is used to connect with patients for Virtual Visits (Telemedicine).  Patients are able to view lab/test results, encounter notes, upcoming appointments, etc.  Non-urgent messages can be sent to your provider as well.   To learn more about what you can do with MyChart, go to [email or web address].              6 SALTY THINGS TO AVOID     1,800MG  DAILY

## 2020-02-25 NOTE — Progress Notes (Addendum)
PATIENT: [name] [name] DOB: [birth year]  REASON FOR VISIT: follow up HISTORY FROM: patient  Chief Complaint  Patient presents with  . Follow-up    Rm 2 with daughter (diane) Pt is well, daughter says memory is getting gradually worse     HISTORY OF PRESENT ILLNESS: Today 02/29/20  She returns today for follow up. We continued Aricept and increased Namenda at last visit.  She has tolerated medications well and without obvious adverse effects.  She presents with her sister today who aids in history.  Her sister feels that cognition has declined since last being seen.  She was hospitalized in November for acute on chronic respiratory failure in the setting of COVID 19 infection.  Overall, she has recovered fairly well following rehab therapy.  She continues to participate in physical therapy and Occupational Therapy at home.  She continues to live in an independent facility but her sister reports that an evaluation is being performed tomorrow by a Education officer, museum to determine whether she may need assisted living arrangements.  She has had more difficulty with confusion.  She seems to be forgetting things more easily.  She remains independent with ADLs.  She is not driving.  No falls.   History (copied from previous note)  [name] [name] is a 87 y.o. female here today for follow up for MCI. She continues Aricept 10mg  daily and Namenda 5mg  BID.  She has tolerated these medications without obvious adverse effects. She feels memory is stable, no better and no worse. She continues to live in independent living. She is managing finances and her home. She is able to perform ADL's independently. She is able to manage her medications. Her daughter reports that she is very good about taking medications as prescribed. She continues to drive. Her PCP has ordered certain restrictions. She is allowed to drive daytime hours only. She is supposed to drive within a 10 mile radius of her home. She  denies accidents or getting lost. She did drive to our office today. She was late to her appt and reports that she got lost. She has not driven to our office by herself in the past.    HISTORY: (copied from my note on 04/23/2019)  [name] [name] is a 87 y.o. female here today for follow up for memory loss. She was evaluated by neuropsychology who felt that she had a moderate level of MCI. She is living in independent living. She is now using a life alert necklace.  She did have one small trip since last being seen but fortunately there were no injuries.  Family initiated life alert following that fall.  She does live alone.  Her daughter comes by to see her at least 3-4 times a week.  Someone calls to check on her every day.  She is not driving at this time.  She is able to dose her own medications.  She is able to perform all activities of daily living independently.  HISTORY: (copied from Dr Cathren Laine note on 12/16/2018)  MVH:QIONGE [name] [name] a 87 y.o.femalehere as requested by Merrilee Seashore, MDfor dementia. Past medical history "senile dementia, without behavioral disturbances".PMHx afib, HTN, Rhabdo, RA, renal insufficiency. Here with her daughter who provides most information. Patient lives alone. She had fallen and in her apartment for 6 days, unsure why, they found her in [place] and some sores.Her sister has dementia, patient denies she has dementia. Daughter noticed changes 3-4 years ago, slowly progressive, patient is irritable, agitated, patient blames daughter,  she accused daughter of taking her blow dryer (it was found), she loses things, started with short-term memory loss, losing things, repeating the same stories over and over, forgetting appointments and losing hr wallet and other items. She takes her own medications. Not missing any bills, paying all the bills. Doctor stopped her from driving. She is on Aricept. She is weaker due to the fall. She has to walk with a cane. They used  tohave a nurse come in from insurance. She declined when leaving the hospital going to a facility, they tried.  Reviewed notes, labs and imaging from outside physicians, which showed:  I reviewed Dr. Rockne Menghini Chandra's notes. Apparently a CT scan done in the hospital was abnormal and doctor suggested that she see a neurologist. The last time Dr. Loreen Freud saw her was in August at which time he was ruling out vascular causes for venous hyperpigmentation and hyperkeratotic lesions of the tips of her big toes bilaterally, more likely a callus than lesion caused by vascular insufficiency, she has been diagnosed with senile dementia in the past without behavioral disturbance, he is recommended that she not drive because of the degree of her dementia. She is on aspirin 81 mg, sertraline, donepezil. Examination showed a normal, alert, general appearance, extraocular movements intact, neck thyroid normal, heart with normal rate rhythm S1-S2 normal, lungs good air movement, chest normal, abdomen normal, nonfocal neurologic exam cranial nerves II through XII grossly intact, alert and oriented, Babinski absent. She is incontinent and uses a adult diaper, she is also had nontraumatic rhabdomyolysis, abnormal liver function tests, renal insufficiency, hyperlipidemia. I reviewed CT of the head completed July 2020 after patient being found down for several days with resultant rhabdomyolysis and renal insufficiency, CT of the head showed mild chronic microvascular ischemic changes, atrophy, no acute findings. I could not review the images, I reviewed the report.   TSH nml 08/2018   REVIEW OF SYSTEMS: Out of a complete 14 system review of symptoms, the patient complains only of the following symptoms, memory loss and all other reviewed systems are negative.   ALLERGIES: Allergies  Allergen Reactions  . Codeine Nausea And Vomiting and Other (See Comments)    Dizziness    HOME MEDICATIONS: Outpatient  Medications Prior to Visit  Medication Sig Dispense Refill  . albuterol (VENTOLIN HFA) 108 (90 Base) MCG/ACT inhaler Inhale 2 puffs into the lungs every 6 (six) hours as needed for wheezing or shortness of breath.    Marland Kitchen aspirin EC 81 MG tablet Take 81 mg by mouth daily.    Marland Kitchen diltiazem (CARDIZEM CD) 240 MG 24 hr capsule Take 1 capsule (240 mg total) by mouth daily.    Marland Kitchen donepezil (ARICEPT) 10 MG tablet Take 10 mg by mouth at bedtime.  4  . memantine (NAMENDA) 10 MG tablet Take 1 tablet (10 mg total) by mouth 2 (two) times daily. 180 tablet 3  . metoprolol tartrate (LOPRESSOR) 25 MG tablet Take 0.5 tablets (12.5 mg total) by mouth 2 (two) times daily. 90 tablet 3  . mirabegron ER (MYRBETRIQ) 25 MG TB24 tablet Take 25 mg by mouth daily.    Marland Kitchen oxybutynin (DITROPAN-XL) 10 MG 24 hr tablet Take 10 mg by mouth daily.    . pantoprazole (PROTONIX) 40 MG tablet Take 1 tablet (40 mg total) by mouth daily.    . rosuvastatin (CRESTOR) 20 MG tablet Take 20 mg by mouth daily.    . sertraline (ZOLOFT) 100 MG tablet Take 100 mg by mouth  daily.   3  . VITAMIN D PO Take 1 capsule by mouth daily.     No facility-administered medications prior to visit.    PAST MEDICAL HISTORY: Past Medical History:  Diagnosis Date  . Aortic stenosis   . Atrial fibrillation ([place])   . Carotid artery bruit 07/22/2012  . Chest pain    myoview 04/24/07-mild-mod perfusion defect with mild-mod superimposed ischemiain the mid inferior, apicla inferior, basal inferolateral and mid inferolateral regions  . Community acquired pneumonia 01/13/2013  . Dementia ([place]) 01/27/2020  . Edema   . Gallstone   . Generalized osteoarthritis   . HTN (hypertension)   . Hyperlipidemia   . Hypochloremia 01/13/2013  . Hyponatremia 01/13/2013  . IFG (impaired fasting glucose)   . Mild neurocognitive disorder 02/26/2019  . Nonspecific abnormal electrocardiogram (ECG) (EKG)   . Normocytic anemia 08/26/2018  . Porokeratosis 09/02/2018  . Premature atrial  contractions 07/22/2012  . Renal insufficiency 08/26/2018  . Rhabdomyolysis 08/26/2018  . Rheumatoid arthritis ([place]) 01/13/2013  . Vertigo    as late effect of cerebrovascular disease    PAST SURGICAL HISTORY: Past Surgical History:  Procedure Laterality Date  . APPENDECTOMY    . BUNIONECTOMY Bilateral   . cataract surgery Bilateral 05/2016  . gallstones removal    . TONSILLECTOMY      FAMILY HISTORY: Family History  Problem Relation Age of Onset  . Diabetes Father   . Seizures Brother   . Alzheimer's disease Sister   . Cancer Sister   . Breast cancer Sister   . Cancer Sister   . Seizures Daughter   . Seizures Son   . Seizures Granddaughter   . Ovarian cancer Granddaughter   . Lung cancer Nephew   . Breast cancer Niece   . Breast cancer Niece   . Brain cancer Niece     SOCIAL HISTORY: Social History   Socioeconomic History  . Marital status: Divorced    Spouse name: Not on file  . Number of children: 4  . Years of education: 8  . Highest education level: Some college, no degree  Occupational History  . Not on file  Tobacco Use  . Smoking status: Never Smoker  . Smokeless tobacco: Never Used  Vaping Use  . Vaping Use: Never used  Substance and Sexual Activity  . Alcohol use: No  . Drug use: No  . Sexual activity: Never  Other Topics Concern  . Not on file  Social History Narrative   Lives at home alone   Right handed   Caffeine: sometimes   Social Determinants of Health   Financial Resource Strain: Not on file  Food Insecurity: Not on file  Transportation Needs: Not on file  Physical Activity: Not on file  Stress: Not on file  Social Connections: Not on file  Intimate Partner Violence: Not on file      PHYSICAL EXAM  Vitals:   02/29/20 0914  BP: 133/84  Pulse: 93  Weight: 169 lb 9.6 oz (76.9 kg)  Height: 5\' 5"  (1.651 m)   Body mass index is 28.22 kg/m.  Generalized: Well developed, in no acute distress  Cardiology: normal rate and  rhythm, no murmur noted Respiratory: clear to auscultation bilaterally  Neurological examination  Mentation: Alert oriented to time, place, history taking. Follows all commands speech and language fluent Cranial nerve II-XII: Pupils were equal round reactive to light. Extraocular movements were full, visual field were full  Motor: The motor testing reveals 5 over 5 strength  of all 4 extremities. Good symmetric motor tone is noted throughout.  Gait and station: Gait is normal.    DIAGNOSTIC DATA (LABS, IMAGING, TESTING) - I reviewed patient records, labs, notes, testing and imaging myself where available.  MMSE - Mini Mental State Exam 02/29/2020 10/28/2019 10/28/2019  Orientation to time 4 5 5   Orientation to Place 4 3 3   Registration 3 3 3   Attention/ Calculation 1 5 5   Attention/Calculation-comments - - -  Recall 2 1 1   Language- name 2 objects 2 2 2   Language- repeat 1 1 1   Language- follow 3 step command 3 3 3   Language- read & follow direction 1 1 1   Write a sentence 1 1 1   Copy design 1 1 1   Total score 23 26 26      Lab Results  Component Value Date   WBC 12.9 (H) 12/28/2019   HGB 10.6 (L) 12/28/2019   HCT 31.2 (L) 12/28/2019   MCV 89.4 12/28/2019   PLT 498 (H) 12/28/2019      Component Value Date/Time   NA 129 (L) 12/29/2019 0054   NA 139 12/16/2018 0903   K 3.8 12/29/2019 0054   CL 93 (L) 12/29/2019 0054   CO2 25 12/29/2019 0054   GLUCOSE 135 (H) 12/29/2019 0054   BUN 27 (H) 12/29/2019 0054   BUN 27 12/16/2018 0903   CREATININE 1.01 (H) 12/29/2019 0054   CALCIUM 8.5 (L) 12/29/2019 0054   PROT 6.3 (L) 12/28/2019 0350   PROT 7.3 12/16/2018 0903   ALBUMIN 2.2 (L) 12/28/2019 0350   ALBUMIN 4.1 12/16/2018 0903   AST 29 12/28/2019 0350   ALT 27 12/28/2019 0350   ALKPHOS 52 12/28/2019 0350   BILITOT 0.9 12/28/2019 0350   BILITOT 0.3 12/16/2018 0903   GFRNONAA 53 (L) 12/29/2019 0054   GFRAA 58 (L) 12/16/2018 0903   Lab Results  Component Value Date   CHOL   04/03/2007    179        ATP III CLASSIFICATION:  <200     mg/dL   Desirable  200-239  mg/dL   Borderline High  >=240    mg/dL   High   HDL 46 04/03/2007   LDLCALC (H) 04/03/2007    117        Total Cholesterol/HDL:CHD Risk Coronary Heart Disease Risk Table                     Men   Women  1/2 Average Risk   3.4   3.3   TRIG 71 12/23/2019   CHOLHDL 3.9 04/03/2007   No results found for: HGBA1C Lab Results  Component Value Date   VITAMINB12 248 12/16/2018   Lab Results  Component Value Date   TSH 1.606 08/26/2018       ASSESSMENT AND PLAN 87 y.o. year old female  has a past medical history of Aortic stenosis, Atrial fibrillation ([place]), Carotid artery bruit (07/22/2012), Chest pain, Community acquired pneumonia (01/13/2013), Dementia ([place]) (01/27/2020), Edema, Gallstone, Generalized osteoarthritis, HTN (hypertension), Hyperlipidemia, Hypochloremia (01/13/2013), Hyponatremia (01/13/2013), IFG (impaired fasting glucose), Mild neurocognitive disorder (02/26/2019), Nonspecific abnormal electrocardiogram (ECG) (EKG), Normocytic anemia (08/26/2018), Porokeratosis (09/02/2018), Premature atrial contractions (07/22/2012), Renal insufficiency (08/26/2018), Rhabdomyolysis (08/26/2018), Rheumatoid arthritis ([place]) (01/13/2013), and Vertigo. here with     ICD-10-CM   1. Mild neurocognitive disorder  G31.84 Ambulatory referral to [facility] is doing fairly well. She has tolerated Aricept and Namenda. We will continue Aricept 10mg   daily and from her Namenda 10mg  twice daily. She anticipates an assessment with social work tomorrow to determine need for assisted living. I feel this would be a good option for her. I have also placed a referral for ST to continue cognitive therapy. If current therapist is unable, we will refer to [facility] as discussed. We have had a lengthy discussion regarding safety precautions. She will continue to use her life alert necklace. I recommend she not drive  at this time. Memory compensation strategies reviewed. She will stay active and well hydrated. She will follow up with PCP regularly. She will return to see me in 6 months.    Orders Placed This Encounter  Procedures  . Ambulatory referral to Speech Therapy    Referral Priority:   Routine    Referral Type:   Speech Therapy    Referral Reason:   Specialty Services Required    Requested Specialty:   Speech Pathology    Number of Visits Requested:   1     No orders of the defined types were placed in this encounter.     I spent 25 minutes with the patient. 50% of this time was spent counseling and educating patient on plan of care and medications.     Debbora Presto, FNP-C 02/29/2020, 11:53 AM [facility] [address], [place], [place] [address] [phone]   Made any corrections needed, and agree with history, physical, neuro exam,assessment and plan as stated.     Sarina Ill, MD [facility]

## 2020-02-25 NOTE — Patient Instructions (Signed)
Below is our plan:  We will continue Aricept and Namenda. Continue PT/OT and ST as directed. If speech therapy is not already working with her from a memory perspective, this can be helpful.   Please make sure you are staying well hydrated. I recommend 50-60 ounces daily. Well balanced diet and regular exercise encouraged.   Please continue follow up with care team as directed.   Follow up with me in 6 months   You may receive a survey regarding today's visit. I encourage you to leave honest feed back as I do use this information to improve patient care. Thank you for seeing me today!      Memory Compensation Strategies  1. Use "WARM" strategy.  W= write it down  A= associate it  R= repeat it  M= make a mental note  2.   You can keep a Social worker.  Use a 3-ring notebook with sections for the following: calendar, important names and phone numbers,  medications, doctors' names/phone numbers, lists/reminders, and a section to journal what you did  each day.   3.    Use a calendar to write appointments down.  4.    Write yourself a schedule for the day.  This can be placed on the calendar or in a separate section of the Memory Notebook.  Keeping a  regular schedule can help memory.  5.    Use medication organizer with sections for each day or morning/evening pills.  You may need help loading it  6.    Keep a basket, or pegboard by the door.  Place items that you need to take out with you in the basket or on the pegboard.  You may also want to  include a message board for reminders.  7.    Use sticky notes.  Place sticky notes with reminders in a place where the task is performed.  For example: " turn off the  stove" placed by the stove, "lock the door" placed on the door at eye level, " take your medications" on  the bathroom mirror or by the place where you normally take your medications.  8.    Use alarms/timers.  Use while cooking to remind yourself to check on food  or as a reminder to take your medicine, or as a  reminder to make a call, or as a reminder to perform another task, etc.   Dementia Dementia is a condition that affects the way the brain functions. It often affects memory and thinking. Usually, dementia gets worse with time and cannot be reversed (progressive dementia). There are many types of dementia, including:  Alzheimer's disease. This type is the most common.  Vascular dementia. This type may happen as the result of a stroke.  Lewy body dementia. This type may happen to people who have Parkinson's disease.  Frontotemporal dementia. This type is caused by damage to nerve cells (neurons) in certain parts of the brain. Some people may be affected by more than one type of dementia. This is called mixed dementia. What are the causes? Dementia is caused by damage to cells in the brain. The area of the brain and the types of cells damaged determine the type of dementia. Usually, this damage is irreversible or cannot be undone. Some examples of irreversible causes include:  Conditions that affect the blood vessels of the brain, such as diabetes, heart disease, or blood vessel disease.  Genetic mutations. In some cases, changes in the brain may be  caused by another condition and can be reversed or slowed. Some examples of reversible causes include:  Injury to the brain.  Certain medicines.  Infection, such as meningitis.  Metabolic problems, such as vitamin B12 deficiency or thyroid disease.  Pressure on the brain, such as from a tumor or blood clot. What are the signs or symptoms? Symptoms of dementia depend on the type of dementia. Common signs of dementia include problems with remembering, thinking, problem solving, decision making, and communicating. These signs develop slowly or get worse with time. This may include:  Problems remembering things.  Having trouble taking a bath or putting clothes on.  Forgetting  appointments.  Forgetting to pay bills.  Difficulty planning and preparing meals.  Having trouble speaking.  Getting lost easily. How is this diagnosed? This condition is diagnosed by a specialist (neurologist). It is diagnosed based on the history of your symptoms, your medical history, a physical exam, and tests. Tests may include:  Tests to evaluate brain function, such as memory tests, cognitive tests, and other tests.  Lab tests, such as blood or urine tests.  Imaging tests, such as a CT scan, a PET scan, or an MRI.  Genetic testing. This may be done if other family members have a diagnosis of certain types of dementia. Your health care provider will talk with you and your family, friends, or caregivers about your history and symptoms. How is this treated?  Treatment for this condition depends on the cause of the dementia. Progressive dementias, such as Alzheimer's disease, cannot be cured, but there may be treatments that help to manage symptoms. Treatment might involve taking medicines that may help to:  Control the dementia.  Slow down the progression of the dementia.  Manage symptoms. In some cases, treating the cause of your dementia can improve symptoms, reverse symptoms, or slow down how quickly your dementia becomes worse. Your health care provider can direct you to support groups, organizations, and other health care providers who can help with decisions about your care. Follow these instructions at home: Medicines  Take over-the-counter and prescription medicines only as told by your health care provider.  Use a pill organizer or pill reminder to help you manage your medicines.  Avoid taking medicines that can affect thinking, such as pain medicines or sleeping medicines. Lifestyle  Make healthy lifestyle choices. ? Be physically active as told by your health care provider. ? Do not use any products that contain nicotine or tobacco, such as cigarettes,  e-cigarettes, and chewing tobacco. If you need help quitting, ask your health care provider. ? Do not drink alcohol. ? Practice stress-management techniques when you get stressed. ? Spend time with other people.  Make sure to get quality sleep. These tips can help you get a good night's rest: ? Avoid napping during the day. ? Keep your sleeping area dark and cool. ? Avoid exercising during the few hours before you go to bed. ? Avoid caffeine products in the evening. Eating and drinking  Drink enough fluid to keep your urine pale yellow.  Eat a healthy diet. General instructions   Work with your health care provider to determine what you need help with and what your safety needs are.  Talk with your health care provider about whether it is safe for you to drive.  If you were given a bracelet that identifies you as a person with memory loss or tracks your location, make sure to wear it at all times.  Work with your  family to make important decisions, such as advance directives, medical power of attorney, or a living will.  Keep all follow-up visits as told by your health care provider. This is important. Where to find more information  Alzheimer's Association: CapitalMile.co.nz  National Institute on Aging: DVDEnthusiasts.nl  World Health Organization: RoleLink.com.br Contact a health care provider if:  You have any new or worsening symptoms.  You have problems with choking or swallowing. Get help right away if:  You feel depressed or sad, or feel that you want to harm yourself.  Your family members become concerned for your safety. If you ever feel like you may hurt yourself or others, or have thoughts about taking your own life, get help right away. You can go to your nearest emergency department or call:  Your local emergency services (911 in the [place]  A suicide crisis helpline, such as the [place] at 360-106-9806. This is open 24 hours  a day. Summary  Dementia is a condition that affects the way the brain functions. Dementia often affects memory and thinking.  Usually, dementia gets worse with time and cannot be reversed (progressive dementia).  Treatment for this condition depends on the cause of the dementia.  Work with your health care provider to determine what you need help with and what your safety needs are.  Your health care provider can direct you to support groups, organizations, and other health care providers who can help with decisions about your care. This information is not intended to replace advice given to you by your health care provider. Make sure you discuss any questions you have with your health care provider. Document Revised: 04/22/2018 Document Reviewed: 04/22/2018 Elsevier Patient Education  [place].

## 2020-02-26 DIAGNOSIS — M810 Age-related osteoporosis without current pathological fracture: Secondary | ICD-10-CM | POA: Diagnosis not present

## 2020-02-26 DIAGNOSIS — R1312 Dysphagia, oropharyngeal phase: Secondary | ICD-10-CM | POA: Diagnosis not present

## 2020-02-26 DIAGNOSIS — I480 Paroxysmal atrial fibrillation: Secondary | ICD-10-CM | POA: Diagnosis not present

## 2020-02-26 DIAGNOSIS — Z9181 History of falling: Secondary | ICD-10-CM | POA: Diagnosis not present

## 2020-02-26 DIAGNOSIS — G309 Alzheimer's disease, unspecified: Secondary | ICD-10-CM | POA: Diagnosis not present

## 2020-02-26 DIAGNOSIS — F0280 Dementia in other diseases classified elsewhere without behavioral disturbance: Secondary | ICD-10-CM | POA: Diagnosis not present

## 2020-02-26 DIAGNOSIS — I10 Essential (primary) hypertension: Secondary | ICD-10-CM | POA: Diagnosis not present

## 2020-02-26 DIAGNOSIS — M069 Rheumatoid arthritis, unspecified: Secondary | ICD-10-CM | POA: Diagnosis not present

## 2020-02-29 ENCOUNTER — Encounter: Payer: Self-pay | Admitting: Family Medicine

## 2020-02-29 ENCOUNTER — Other Ambulatory Visit: Payer: Self-pay | Admitting: Internal Medicine

## 2020-02-29 ENCOUNTER — Ambulatory Visit: Payer: PPO | Admitting: Family Medicine

## 2020-02-29 VITALS — BP 133/84 | HR 93 | Ht 65.0 in | Wt 169.6 lb

## 2020-02-29 DIAGNOSIS — G3184 Mild cognitive impairment, so stated: Secondary | ICD-10-CM

## 2020-02-29 DIAGNOSIS — R29898 Other symptoms and signs involving the musculoskeletal system: Secondary | ICD-10-CM | POA: Diagnosis not present

## 2020-02-29 DIAGNOSIS — I480 Paroxysmal atrial fibrillation: Secondary | ICD-10-CM | POA: Diagnosis not present

## 2020-02-29 DIAGNOSIS — E782 Mixed hyperlipidemia: Secondary | ICD-10-CM | POA: Diagnosis not present

## 2020-02-29 DIAGNOSIS — I10 Essential (primary) hypertension: Secondary | ICD-10-CM | POA: Diagnosis not present

## 2020-02-29 DIAGNOSIS — F0280 Dementia in other diseases classified elsewhere without behavioral disturbance: Secondary | ICD-10-CM | POA: Diagnosis not present

## 2020-02-29 DIAGNOSIS — I739 Peripheral vascular disease, unspecified: Secondary | ICD-10-CM | POA: Diagnosis not present

## 2020-02-29 DIAGNOSIS — G301 Alzheimer's disease with late onset: Secondary | ICD-10-CM | POA: Diagnosis not present

## 2020-02-29 DIAGNOSIS — Z0000 Encounter for general adult medical examination without abnormal findings: Secondary | ICD-10-CM | POA: Diagnosis not present

## 2020-02-29 DIAGNOSIS — M059 Rheumatoid arthritis with rheumatoid factor, unspecified: Secondary | ICD-10-CM | POA: Diagnosis not present

## 2020-03-01 ENCOUNTER — Ambulatory Visit
Admission: RE | Admit: 2020-03-01 | Discharge: 2020-03-01 | Disposition: A | Discharge status: Home / Self Care | Payer: PPO | Source: Ambulatory Visit | Attending: Internal Medicine | Admitting: Internal Medicine

## 2020-03-01 ENCOUNTER — Other Ambulatory Visit: Payer: Self-pay

## 2020-03-01 DIAGNOSIS — R9082 White matter disease, unspecified: Secondary | ICD-10-CM | POA: Diagnosis not present

## 2020-03-01 DIAGNOSIS — R531 Weakness: Secondary | ICD-10-CM | POA: Diagnosis not present

## 2020-03-01 DIAGNOSIS — R29898 Other symptoms and signs involving the musculoskeletal system: Secondary | ICD-10-CM

## 2020-03-10 DIAGNOSIS — G301 Alzheimer's disease with late onset: Secondary | ICD-10-CM | POA: Diagnosis not present

## 2020-03-10 DIAGNOSIS — I739 Peripheral vascular disease, unspecified: Secondary | ICD-10-CM | POA: Diagnosis not present

## 2020-03-10 DIAGNOSIS — I10 Essential (primary) hypertension: Secondary | ICD-10-CM | POA: Diagnosis not present

## 2020-03-10 DIAGNOSIS — E782 Mixed hyperlipidemia: Secondary | ICD-10-CM | POA: Diagnosis not present

## 2020-03-10 DIAGNOSIS — R29898 Other symptoms and signs involving the musculoskeletal system: Secondary | ICD-10-CM | POA: Diagnosis not present

## 2020-03-31 DIAGNOSIS — H524 Presbyopia: Secondary | ICD-10-CM | POA: Diagnosis not present

## 2020-03-31 DIAGNOSIS — H353131 Nonexudative age-related macular degeneration, bilateral, early dry stage: Secondary | ICD-10-CM | POA: Diagnosis not present

## 2020-03-31 DIAGNOSIS — Z961 Presence of intraocular lens: Secondary | ICD-10-CM | POA: Diagnosis not present

## 2020-03-31 DIAGNOSIS — H52201 Unspecified astigmatism, right eye: Secondary | ICD-10-CM | POA: Diagnosis not present

## 2020-03-31 DIAGNOSIS — H5213 Myopia, bilateral: Secondary | ICD-10-CM | POA: Diagnosis not present

## 2020-04-01 DIAGNOSIS — M810 Age-related osteoporosis without current pathological fracture: Secondary | ICD-10-CM | POA: Diagnosis not present

## 2020-04-01 DIAGNOSIS — R1312 Dysphagia, oropharyngeal phase: Secondary | ICD-10-CM | POA: Diagnosis not present

## 2020-04-01 DIAGNOSIS — I480 Paroxysmal atrial fibrillation: Secondary | ICD-10-CM | POA: Diagnosis not present

## 2020-04-01 DIAGNOSIS — M069 Rheumatoid arthritis, unspecified: Secondary | ICD-10-CM | POA: Diagnosis not present

## 2020-04-01 DIAGNOSIS — Z9181 History of falling: Secondary | ICD-10-CM | POA: Diagnosis not present

## 2020-04-01 DIAGNOSIS — F0280 Dementia in other diseases classified elsewhere without behavioral disturbance: Secondary | ICD-10-CM | POA: Diagnosis not present

## 2020-04-01 DIAGNOSIS — G309 Alzheimer's disease, unspecified: Secondary | ICD-10-CM | POA: Diagnosis not present

## 2020-04-01 DIAGNOSIS — I10 Essential (primary) hypertension: Secondary | ICD-10-CM | POA: Diagnosis not present

## 2020-04-06 DIAGNOSIS — R29898 Other symptoms and signs involving the musculoskeletal system: Secondary | ICD-10-CM | POA: Diagnosis not present

## 2020-04-07 DIAGNOSIS — E782 Mixed hyperlipidemia: Secondary | ICD-10-CM | POA: Diagnosis not present

## 2020-04-07 DIAGNOSIS — G301 Alzheimer's disease with late onset: Secondary | ICD-10-CM | POA: Diagnosis not present

## 2020-04-07 DIAGNOSIS — I739 Peripheral vascular disease, unspecified: Secondary | ICD-10-CM | POA: Diagnosis not present

## 2020-04-07 DIAGNOSIS — I10 Essential (primary) hypertension: Secondary | ICD-10-CM | POA: Diagnosis not present

## 2020-04-11 DIAGNOSIS — R29898 Other symptoms and signs involving the musculoskeletal system: Secondary | ICD-10-CM | POA: Diagnosis not present

## 2020-04-18 ENCOUNTER — Telehealth: Payer: Self-pay | Admitting: Family Medicine

## 2020-04-18 ENCOUNTER — Other Ambulatory Visit: Payer: Self-pay | Admitting: Internal Medicine

## 2020-04-18 DIAGNOSIS — Z1231 Encounter for screening mammogram for malignant neoplasm of breast: Secondary | ICD-10-CM

## 2020-04-18 NOTE — Telephone Encounter (Signed)
Noted, thank you

## 2020-04-18 NOTE — Telephone Encounter (Signed)
Hi [name] [name] I talked to Daughter and Patient is scheduled for [facility] 02/29/2022 .  I hope this helps let me know . Hinton Dyer

## 2020-04-18 NOTE — Telephone Encounter (Signed)
Daughter(on DPR) is asking for a call to know the reason for the referral from Amy,NP

## 2020-04-18 NOTE — Telephone Encounter (Addendum)
Lvm for pt's daughter to call back. Per note from AL, NP on 02/29/20, referral was placed to continue speech therapy. AL, NP notes states "She is already working with [facility] ( PT/OT/ST) [phone]. Would like to refer for cognitive therapy if not already doing this."  It looks like the referral was not sent to [facility] originally, but to [facility]. I will message referral coordinator. Referral was for order to go to [facility] originally if they offered this service.

## 2020-04-19 ENCOUNTER — Other Ambulatory Visit: Payer: Self-pay

## 2020-04-19 ENCOUNTER — Ambulatory Visit: Payer: PPO | Attending: Internal Medicine

## 2020-04-19 DIAGNOSIS — R41841 Cognitive communication deficit: Secondary | ICD-10-CM | POA: Insufficient documentation

## 2020-04-19 NOTE — Therapy (Signed)
[facility] [address] [place], [place], [address] Phone: [phone]   Fax:  [phone]  Speech Language Pathology Evaluation  Patient Details  Name: [name] [name] MRN: [id number] Date of Birth: 25-Jul-1928 Referring Provider (SLP): Ubaldo Glassing, Amy NP   Encounter Date: 04/19/2020   End of Session - 04/19/20 1157    Visit Number 1    Number of Visits 9    Date for SLP Re-Evaluation 07/18/20    Authorization Type [employer]    SLP Start Time 1104    SLP Stop Time  1148    SLP Time Calculation (min) 44 min    Activity Tolerance Patient tolerated treatment well           Past Medical History:  Diagnosis Date  . Aortic stenosis   . Atrial fibrillation ([place])   . Carotid artery bruit 07/22/2012  . Chest pain    myoview 04/24/07-mild-mod perfusion defect with mild-mod superimposed ischemiain the mid inferior, apicla inferior, basal inferolateral and mid inferolateral regions  . Community acquired pneumonia 01/13/2013  . Dementia ([place]) 01/27/2020  . Edema   . Gallstone   . Generalized osteoarthritis   . HTN (hypertension)   . Hyperlipidemia   . Hypochloremia 01/13/2013  . Hyponatremia 01/13/2013  . IFG (impaired fasting glucose)   . Mild neurocognitive disorder 02/26/2019  . Nonspecific abnormal electrocardiogram (ECG) (EKG)   . Normocytic anemia 08/26/2018  . Porokeratosis 09/02/2018  . Premature atrial contractions 07/22/2012  . Renal insufficiency 08/26/2018  . Rhabdomyolysis 08/26/2018  . Rheumatoid arthritis ([place]) 01/13/2013  . Vertigo    as late effect of cerebrovascular disease    Past Surgical History:  Procedure Laterality Date  . APPENDECTOMY    . BUNIONECTOMY Bilateral   . cataract surgery Bilateral 05/2016  . gallstones removal    . TONSILLECTOMY      There were no vitals filed for this visit.   Subjective Assessment - 04/19/20 1126    Subjective "Im fine"    Patient is accompained by:  Family member    Currently in Pain? No/denies              SLP Evaluation [facility] - 04/19/20 1126      SLP Visit Information   SLP Received On 02/29/20    Referring Provider (SLP) Ubaldo Glassing, Amy NP    Onset Date ~2 yrs per daughter    Medical Diagnosis mild neurocognitive disorder      Subjective   Subjective pt increasingly unaware of memory and safety deficits    Patient/Family Stated Goal to improve safety and independence      General Information   HPI Daughter reports [place] was diagnosed with dementia ~2 years ago. She was hospitalized in November for acute on chronic respiratory failure in the setting of COVID 19 infection.  Overall, she recovered fairly well following rehab therapy. She continues to live in an independent facility but her sister reports that an evaluation was performed by a Education officer, museum to determine whether she may need assisted living arrangements.  She has had more difficulty with confusion. She seems to be forgetting things more easily.  She remains independent with ADLs.  She is not driving.    Mobility Status ambulatory with walker      Balance Screen   Has the patient fallen in the past 6 months Yes    How many times? 1    Has the patient had a decrease in activity level because of  a fear of falling?  Yes    Is the patient reluctant to leave their home because of a fear of falling?  Yes      Prior Functional Status   Cognitive/Linguistic Baseline Baseline deficits    Baseline deficit details hx of dementia ~ 2 years ago    Type of Home Independent living facility     Lives With Alone    Available Support Family    Education 2 yrs of college    Vocation Retired   Chief Financial Officer at [employer]   Overall Cognitive Status Impaired/Different from baseline    Area of [place];Safety/judgement;Awareness    Memory Decreased recall of precautions;Decreased short-term memory    Memory Comments forgetting to wear alert necklace     Safety/Judgement Decreased awareness of safety;Decreased awareness of deficits    Safety and Judgement Comments bending over quickly, tripping hazards in apt    Awareness Intellectual    Awareness Comments hx of dementia      Auditory Comprehension   Overall Auditory Comprehension Appears within functional limits for tasks assessed      Verbal Expression   Overall Verbal Expression Appears within functional limits for tasks assessed      Oral Motor/Sensory Function   Overall Oral Motor/Sensory Function Appears within functional limits for tasks assessed   not assessed due to time constraints     Motor Speech   Overall Motor Speech Appears within functional limits for tasks assessed      Standardized Assessments   Standardized Assessments  Montreal Cognitive Assessment ([place])    Montreal Cognitive Assessment ([place])  19/30 indicating cognitive impairment                           SLP Education - 04/19/20 1157    Education Details eval results, safety recommendations    Person(s) Educated Patient;Child(ren)    Methods Explanation;Demonstration    Comprehension Verbalized understanding;Returned demonstration;Need further instruction            SLP Short Term Goals - 04/19/20 1158      SLP SHORT TERM GOAL #1   Title Pt will use external memory aids for daily schedule, medicine management, and other daily activities with min A between or in 2 sessions.    Time 4    Period Weeks    Status New      SLP SHORT TERM GOAL #2   Title Pt will recall to don alert necklace using external memory aids and Spaced Retrievel with min A from caregiver for 2 sessions    Time 4    Period Weeks    Status New      SLP SHORT TERM GOAL #3   Title Pt will recall needed items (i.e., reading glasses) and placed in walker bag given min A over 2 sessions    Time 4    Period Weeks    Status New            SLP Long Term Goals - 04/19/20 1323      SLP LONG TERM GOAL #1    Title Pt will use external memory aids for daily schedule, medicine management, and other daily activities with rare min A between or in 3 sessions.    Time 8    Period Weeks    Status New      SLP LONG TERM GOAL #2   Title Pt will recall  to don alert necklace using external memory aids and Spaced Retrievel with rare min A from caregiver over 3 sessions    Time 8    Period Weeks    Status New      SLP LONG TERM GOAL #3   Title Caregiver will report decreased frequency for reminders for medications and pertinent information across 3 sessions    Time [place] - 04/19/20 1608    Clinical [place] presents today with family reported decline in cognition secondary to progression of dementia. She lives alone at an independent living facility (family currently inquiring for assisted living). Daughter, [name], manages all the finances and provides weekly medications in organized pill box. Daughter reports Sherle occasionally forgets to take all the medicines per day (often misses 1/2 pill). Pt wears alert necklace in case of emergency; however, [name] reports that [facility] often forgets to take it off the charger or forgets to put it back on if she takes it off. Other unsafe behaviors include reaching for dropped items, standing up too quickly, and leaving items laying in walking path. [name] also questioned whether pt is bathing often enough and remembering to eat given recent weight loss. Due to decline in memory and safety awareness impacting functional independence and safety at home, pt would benefit from skilled ST intervention to target memory compensations/external aids and family/caregiver education.    Speech Therapy Frequency 1x /week    Duration 8 weeks   or 9 total visits   Treatment/Interventions Compensatory strategies;Functional tasks;Patient/family education;Cueing hierarchy;Multimodal communcation approach;SLP instruction and  feedback;Internal/external aids;Compensatory techniques    Potential to Achieve Goals Fair    Potential Considerations Co-morbidities    Consulted and Agree with Plan of Care Patient;Family member/caregiver    Family Member Consulted [name]           Patient will benefit from skilled therapeutic intervention in order to improve the following deficits and impairments:   Cognitive communication deficit - Plan: SLP plan of care cert/re-cert    Problem List Patient Active Problem List   Diagnosis Date Noted  . Dementia ([place]) 01/27/2020  . Pneumonia due to COVID-19 virus 12/23/2019  . Acute hypoxemic respiratory failure ([place]) 12/23/2019  . Hypokalemia 12/23/2019  . Transaminitis 12/23/2019  . Slow heart rate 03/13/2019  . Mild neurocognitive disorder 02/26/2019  . Clavi 01/20/2019  . Porokeratosis 09/02/2018  . Corns and callosities 09/02/2018  . Elevated CK   . Pressure injury of skin 08/27/2018  . PAF (paroxysmal atrial fibrillation) ([place]) 08/27/2018  . Hypernatremia   . Rhabdomyolysis 08/26/2018  . Normocytic anemia 08/26/2018  . Renal insufficiency 08/26/2018  . Community acquired pneumonia 01/13/2013  . Sepsis ([place]) 01/13/2013  . Hyponatremia 01/13/2013  . Hypochloremia 01/13/2013  . Rheumatoid arthritis ([place]) 01/13/2013  . Premature atrial contractions 07/22/2012  . Hyperlipidemia 07/22/2012  . Carotid artery bruit 07/22/2012  . Moderate aortic stenosis 07/22/2012  . Chest pain   . Essential hypertension     [name], MA CCC-SLP 04/19/2020, 5:05 PM  [facility] [address] [place], [place], [address] Phone: [phone]   Fax:  [phone]  Name: Rovena Hearld MRN: [id number] Date of Birth: Oct 06, 1928

## 2020-04-20 DIAGNOSIS — R29898 Other symptoms and signs involving the musculoskeletal system: Secondary | ICD-10-CM | POA: Diagnosis not present

## 2020-04-25 ENCOUNTER — Ambulatory Visit: Payer: PPO

## 2020-04-25 ENCOUNTER — Other Ambulatory Visit: Payer: Self-pay

## 2020-04-25 DIAGNOSIS — R188 Other ascites: Secondary | ICD-10-CM | POA: Diagnosis not present

## 2020-04-25 DIAGNOSIS — M1612 Unilateral primary osteoarthritis, left hip: Secondary | ICD-10-CM | POA: Diagnosis not present

## 2020-04-25 DIAGNOSIS — I083 Combined rheumatic disorders of mitral, aortic and tricuspid valves: Secondary | ICD-10-CM | POA: Diagnosis not present

## 2020-04-25 DIAGNOSIS — E46 Unspecified protein-calorie malnutrition: Secondary | ICD-10-CM | POA: Diagnosis not present

## 2020-04-25 DIAGNOSIS — F32A Depression, unspecified: Secondary | ICD-10-CM | POA: Diagnosis not present

## 2020-04-25 DIAGNOSIS — E8809 Other disorders of plasma-protein metabolism, not elsewhere classified: Secondary | ICD-10-CM | POA: Diagnosis not present

## 2020-04-25 DIAGNOSIS — R748 Abnormal levels of other serum enzymes: Secondary | ICD-10-CM | POA: Diagnosis not present

## 2020-04-25 DIAGNOSIS — W19XXXA Unspecified fall, initial encounter: Secondary | ICD-10-CM | POA: Diagnosis not present

## 2020-04-25 DIAGNOSIS — M7989 Other specified soft tissue disorders: Secondary | ICD-10-CM | POA: Diagnosis not present

## 2020-04-25 DIAGNOSIS — I10 Essential (primary) hypertension: Secondary | ICD-10-CM | POA: Diagnosis not present

## 2020-04-25 DIAGNOSIS — I4821 Permanent atrial fibrillation: Secondary | ICD-10-CM | POA: Diagnosis not present

## 2020-04-25 DIAGNOSIS — M2550 Pain in unspecified joint: Secondary | ICD-10-CM | POA: Diagnosis not present

## 2020-04-25 DIAGNOSIS — R404 Transient alteration of awareness: Secondary | ICD-10-CM | POA: Diagnosis not present

## 2020-04-25 DIAGNOSIS — R55 Syncope and collapse: Secondary | ICD-10-CM | POA: Diagnosis not present

## 2020-04-25 DIAGNOSIS — W1830XA Fall on same level, unspecified, initial encounter: Secondary | ICD-10-CM | POA: Diagnosis present

## 2020-04-25 DIAGNOSIS — I340 Nonrheumatic mitral (valve) insufficiency: Secondary | ICD-10-CM | POA: Diagnosis not present

## 2020-04-25 DIAGNOSIS — Z7401 Bed confinement status: Secondary | ICD-10-CM | POA: Diagnosis not present

## 2020-04-25 DIAGNOSIS — R41841 Cognitive communication deficit: Secondary | ICD-10-CM

## 2020-04-25 DIAGNOSIS — S0990XA Unspecified injury of head, initial encounter: Secondary | ICD-10-CM | POA: Diagnosis not present

## 2020-04-25 DIAGNOSIS — R54 Age-related physical debility: Secondary | ICD-10-CM | POA: Diagnosis not present

## 2020-04-25 DIAGNOSIS — E876 Hypokalemia: Secondary | ICD-10-CM | POA: Diagnosis not present

## 2020-04-25 DIAGNOSIS — R531 Weakness: Secondary | ICD-10-CM | POA: Diagnosis not present

## 2020-04-25 DIAGNOSIS — E778 Other disorders of glycoprotein metabolism: Secondary | ICD-10-CM | POA: Diagnosis not present

## 2020-04-25 DIAGNOSIS — N179 Acute kidney failure, unspecified: Secondary | ICD-10-CM | POA: Diagnosis not present

## 2020-04-25 DIAGNOSIS — E440 Moderate protein-calorie malnutrition: Secondary | ICD-10-CM | POA: Diagnosis not present

## 2020-04-25 DIAGNOSIS — R29898 Other symptoms and signs involving the musculoskeletal system: Secondary | ICD-10-CM | POA: Diagnosis not present

## 2020-04-25 DIAGNOSIS — M778 Other enthesopathies, not elsewhere classified: Secondary | ICD-10-CM | POA: Diagnosis not present

## 2020-04-25 DIAGNOSIS — I480 Paroxysmal atrial fibrillation: Secondary | ICD-10-CM | POA: Diagnosis not present

## 2020-04-25 DIAGNOSIS — Z7189 Other specified counseling: Secondary | ICD-10-CM | POA: Diagnosis not present

## 2020-04-25 DIAGNOSIS — Y92009 Unspecified place in unspecified non-institutional (private) residence as the place of occurrence of the external cause: Secondary | ICD-10-CM | POA: Diagnosis not present

## 2020-04-25 DIAGNOSIS — R627 Adult failure to thrive: Secondary | ICD-10-CM | POA: Diagnosis not present

## 2020-04-25 DIAGNOSIS — E860 Dehydration: Secondary | ICD-10-CM | POA: Diagnosis not present

## 2020-04-25 DIAGNOSIS — R296 Repeated falls: Secondary | ICD-10-CM | POA: Diagnosis not present

## 2020-04-25 DIAGNOSIS — R34 Anuria and oliguria: Secondary | ICD-10-CM | POA: Diagnosis not present

## 2020-04-25 DIAGNOSIS — I483 Typical atrial flutter: Secondary | ICD-10-CM | POA: Diagnosis not present

## 2020-04-25 DIAGNOSIS — I6782 Cerebral ischemia: Secondary | ICD-10-CM | POA: Diagnosis not present

## 2020-04-25 DIAGNOSIS — I110 Hypertensive heart disease with heart failure: Secondary | ICD-10-CM | POA: Diagnosis not present

## 2020-04-25 DIAGNOSIS — M6282 Rhabdomyolysis: Secondary | ICD-10-CM | POA: Diagnosis not present

## 2020-04-25 DIAGNOSIS — I351 Nonrheumatic aortic (valve) insufficiency: Secondary | ICD-10-CM | POA: Diagnosis not present

## 2020-04-25 DIAGNOSIS — I5042 Chronic combined systolic (congestive) and diastolic (congestive) heart failure: Secondary | ICD-10-CM | POA: Diagnosis not present

## 2020-04-25 DIAGNOSIS — I350 Nonrheumatic aortic (valve) stenosis: Secondary | ICD-10-CM | POA: Diagnosis not present

## 2020-04-25 DIAGNOSIS — W19XXXD Unspecified fall, subsequent encounter: Secondary | ICD-10-CM | POA: Diagnosis not present

## 2020-04-25 DIAGNOSIS — M159 Polyosteoarthritis, unspecified: Secondary | ICD-10-CM | POA: Diagnosis not present

## 2020-04-25 DIAGNOSIS — I361 Nonrheumatic tricuspid (valve) insufficiency: Secondary | ICD-10-CM | POA: Diagnosis not present

## 2020-04-25 DIAGNOSIS — S79912A Unspecified injury of left hip, initial encounter: Secondary | ICD-10-CM | POA: Diagnosis not present

## 2020-04-25 DIAGNOSIS — Z515 Encounter for palliative care: Secondary | ICD-10-CM | POA: Diagnosis not present

## 2020-04-25 DIAGNOSIS — E785 Hyperlipidemia, unspecified: Secondary | ICD-10-CM | POA: Diagnosis not present

## 2020-04-25 DIAGNOSIS — F0390 Unspecified dementia without behavioral disturbance: Secondary | ICD-10-CM | POA: Diagnosis not present

## 2020-04-25 DIAGNOSIS — R9431 Abnormal electrocardiogram [ECG] [EKG]: Secondary | ICD-10-CM | POA: Diagnosis not present

## 2020-04-25 DIAGNOSIS — M069 Rheumatoid arthritis, unspecified: Secondary | ICD-10-CM | POA: Diagnosis not present

## 2020-04-25 DIAGNOSIS — I4892 Unspecified atrial flutter: Secondary | ICD-10-CM | POA: Diagnosis not present

## 2020-04-25 DIAGNOSIS — Z20822 Contact with and (suspected) exposure to covid-19: Secondary | ICD-10-CM | POA: Diagnosis not present

## 2020-04-25 DIAGNOSIS — Z66 Do not resuscitate: Secondary | ICD-10-CM | POA: Diagnosis not present

## 2020-04-25 DIAGNOSIS — Z6831 Body mass index (BMI) 31.0-31.9, adult: Secondary | ICD-10-CM | POA: Diagnosis not present

## 2020-04-25 NOTE — Therapy (Signed)
[facility] [address] [place], [place], [address] Phone: [phone]   Fax:  [phone]  Speech Language Pathology Treatment  Patient Details  Name: [name] [name] MRN: [id number] Date of Birth: 01-07-1929 Referring Provider (SLP): Ubaldo Glassing, Amy NP   Encounter Date: 04/25/2020   End of Session - 04/25/20 1031    Visit Number 2    Number of Visits 9    Date for SLP Re-Evaluation 07/18/20    Authorization Type [employer]    SLP Start Time 0845    SLP Stop Time  0930    SLP Time Calculation (min) 45 min    Activity Tolerance Patient tolerated treatment well           Past Medical History:  Diagnosis Date  . Aortic stenosis   . Atrial fibrillation ([place])   . Carotid artery bruit 07/22/2012  . Chest pain    myoview 04/24/07-mild-mod perfusion defect with mild-mod superimposed ischemiain the mid inferior, apicla inferior, basal inferolateral and mid inferolateral regions  . Community acquired pneumonia 01/13/2013  . Dementia ([place]) 01/27/2020  . Edema   . Gallstone   . Generalized osteoarthritis   . HTN (hypertension)   . Hyperlipidemia   . Hypochloremia 01/13/2013  . Hyponatremia 01/13/2013  . IFG (impaired fasting glucose)   . Mild neurocognitive disorder 02/26/2019  . Nonspecific abnormal electrocardiogram (ECG) (EKG)   . Normocytic anemia 08/26/2018  . Porokeratosis 09/02/2018  . Premature atrial contractions 07/22/2012  . Renal insufficiency 08/26/2018  . Rhabdomyolysis 08/26/2018  . Rheumatoid arthritis ([place]) 01/13/2013  . Vertigo    as late effect of cerebrovascular disease    Past Surgical History:  Procedure Laterality Date  . APPENDECTOMY    . BUNIONECTOMY Bilateral   . cataract surgery Bilateral 05/2016  . gallstones removal    . TONSILLECTOMY      There were no vitals filed for this visit.   Subjective Assessment - 04/25/20 0850    Subjective "nothing" when SLP inquired about weekend  events    Patient is accompained by: Family member   [name]   Currently in Pain? No/denies                 ADULT SLP TREATMENT - 04/25/20 0842      General Information   Behavior/Cognition Alert;Cooperative;Confused;Requires cueing      Treatment Provided   Treatment provided Cognitive-Linquistic      Cognitive-Linquistic Treatment   Treatment focused on Cognition;Patient/family/caregiver education    Skilled Treatment Pt arrived 30+ mins late for inital appointment. Pt re-scheduled for next slot. Pt's daughter requested private conversation with SLP, in which daughter became emotional regarding recent events. Pt fell over weekend at home as pt reports she "slipped" from her chair, which was not detected by alert necklace. Pt did not press help button on necklace either. Daughter also reports she provided wake up call at [place], in which pt stated she was getting ready. Pt was not ready in time for intial appointment, with pt "sitting with legs crossed" and no independent initation to get ready without prompting. SLP provided education and instruction with patient and caregiver on strategies to trial at home, including visual aids to cue patient and use of phone for alerts. SLP targeted Spaced Retrieval Therapy for recall to use alert button, which pt able to recall up to 16 minutes delayed.      Assessment / Recommendations / Plan   Plan Continue with current plan of care  Progression Toward Goals   Progression toward goals Progressing toward goals            SLP Education - 04/25/20 0929    Education Details visual aids, Investment banker, operational) Educated Patient;Child(ren)    Methods Explanation;Demonstration;Handout    Comprehension Verbalized understanding;Returned demonstration;Need further instruction            SLP Short Term Goals - 04/25/20 0845      SLP SHORT TERM GOAL #1   Title Pt will use external memory aids for daily schedule, medicine  management, and other daily activities with min A between or in 2 sessions.    Time 4    Period Weeks    Status On-going      SLP SHORT TERM GOAL #2   Title Pt will recall to don alert necklace using external memory aids and Spaced Retrievel with min A from caregiver for 2 sessions    Time 4    Period Weeks    Status On-going      SLP SHORT TERM GOAL #3   Title Pt will recall needed items (i.e., reading glasses) and placed in walker bag given min A over 2 sessions    Time 4    Period Weeks    Status On-going            SLP Long Term Goals - 04/25/20 0845      SLP LONG TERM GOAL #1   Title Pt will use external memory aids for daily schedule, medicine management, and other daily activities with rare min A between or in 3 sessions.    Time 8    Period Weeks    Status On-going      SLP LONG TERM GOAL #2   Title Pt will recall to don alert necklace using external memory aids and Spaced Retrievel with rare min A from caregiver over 3 sessions    Time 8    Period Weeks    Status On-going      SLP LONG TERM GOAL #3   Title Caregiver will report decreased frequency for reminders for medications and pertinent information across 3 sessions    Time 8    Period Weeks    Status On-going            Plan - 04/25/20 [place] presents today with mod-severe cognitive linguistic deficits secondary to dementia.  Daughter, [name], reports patient fell over weekend and family found her down in her apartment. Pt did not use alert button independently and fall was not detected by alert system. Pt only recalled fall with max verbal prompting from SLP. SLP utilized Licensed conveyancer Therapy to train use of alert necklace. SLP created and trained visual aids to trial at home to increase recall and safety. Due to decline in memory and decreased safety awareness impacting functional independence and safety at home, pt would benefit from skilled ST intervention to  target memory compensations/external aids and family/caregiver education.    Speech Therapy Frequency 1x /week    Duration 8 weeks    Treatment/Interventions Compensatory strategies;Functional tasks;Patient/family education;Cueing hierarchy;Multimodal communcation approach;SLP instruction and feedback;Internal/external aids;Compensatory techniques    Potential to Achieve Goals Fair    Potential Considerations Co-morbidities    SLP Home Exercise Plan provided    Consulted and Agree with Plan of Care Patient;Family member/caregiver    Family Member Consulted [name]  Patient will benefit from skilled therapeutic intervention in order to improve the following deficits and impairments:   Cognitive communication deficit    Problem List Patient Active Problem List   Diagnosis Date Noted  . Dementia ([place]) 01/27/2020  . Pneumonia due to COVID-19 virus 12/23/2019  . Acute hypoxemic respiratory failure ([place]) 12/23/2019  . Hypokalemia 12/23/2019  . Transaminitis 12/23/2019  . Slow heart rate 03/13/2019  . Mild neurocognitive disorder 02/26/2019  . Clavi 01/20/2019  . Porokeratosis 09/02/2018  . Corns and callosities 09/02/2018  . Elevated CK   . Pressure injury of skin 08/27/2018  . PAF (paroxysmal atrial fibrillation) ([place]) 08/27/2018  . Hypernatremia   . Rhabdomyolysis 08/26/2018  . Normocytic anemia 08/26/2018  . Renal insufficiency 08/26/2018  . Community acquired pneumonia 01/13/2013  . Sepsis ([place]) 01/13/2013  . Hyponatremia 01/13/2013  . Hypochloremia 01/13/2013  . Rheumatoid arthritis ([place]) 01/13/2013  . Premature atrial contractions 07/22/2012  . Hyperlipidemia 07/22/2012  . Carotid artery bruit 07/22/2012  . Moderate aortic stenosis 07/22/2012  . Chest pain   . Essential hypertension     [name], MA CCC-SLP 04/25/2020, 10:40 AM  [facility] [address] [place], [place],  [address] Phone: [phone]   Fax:  [phone]   Name: [name] [name] MRN: [id number] Date of Birth: [birth year]

## 2020-04-28 ENCOUNTER — Other Ambulatory Visit: Payer: Self-pay

## 2020-04-28 ENCOUNTER — Encounter (HOSPITAL_COMMUNITY): Payer: Self-pay | Admitting: Emergency Medicine

## 2020-04-28 ENCOUNTER — Inpatient Hospital Stay (HOSPITAL_COMMUNITY)
Admission: EM | Admit: 2020-04-28 | Discharge: 2020-05-11 | DRG: 641 | Disposition: A | Discharge status: Skilled Nursing Facility | Payer: PPO | Attending: Family Medicine | Admitting: Family Medicine

## 2020-04-28 ENCOUNTER — Inpatient Hospital Stay (HOSPITAL_COMMUNITY): Payer: PPO

## 2020-04-28 DIAGNOSIS — E861 Hypovolemia: Secondary | ICD-10-CM | POA: Diagnosis present

## 2020-04-28 DIAGNOSIS — Z7982 Long term (current) use of aspirin: Secondary | ICD-10-CM

## 2020-04-28 DIAGNOSIS — R531 Weakness: Secondary | ICD-10-CM | POA: Diagnosis present

## 2020-04-28 DIAGNOSIS — E8809 Other disorders of plasma-protein metabolism, not elsewhere classified: Secondary | ICD-10-CM | POA: Diagnosis present

## 2020-04-28 DIAGNOSIS — E46 Unspecified protein-calorie malnutrition: Secondary | ICD-10-CM | POA: Diagnosis present

## 2020-04-28 DIAGNOSIS — W1830XA Fall on same level, unspecified, initial encounter: Secondary | ICD-10-CM | POA: Diagnosis present

## 2020-04-28 DIAGNOSIS — F32A Depression, unspecified: Secondary | ICD-10-CM | POA: Diagnosis present

## 2020-04-28 DIAGNOSIS — R54 Age-related physical debility: Secondary | ICD-10-CM | POA: Diagnosis present

## 2020-04-28 DIAGNOSIS — Y92009 Unspecified place in unspecified non-institutional (private) residence as the place of occurrence of the external cause: Secondary | ICD-10-CM | POA: Diagnosis not present

## 2020-04-28 DIAGNOSIS — I340 Nonrheumatic mitral (valve) insufficiency: Secondary | ICD-10-CM | POA: Diagnosis not present

## 2020-04-28 DIAGNOSIS — I5042 Chronic combined systolic (congestive) and diastolic (congestive) heart failure: Secondary | ICD-10-CM | POA: Diagnosis present

## 2020-04-28 DIAGNOSIS — N179 Acute kidney failure, unspecified: Secondary | ICD-10-CM | POA: Diagnosis present

## 2020-04-28 DIAGNOSIS — M069 Rheumatoid arthritis, unspecified: Secondary | ICD-10-CM | POA: Diagnosis present

## 2020-04-28 DIAGNOSIS — Z515 Encounter for palliative care: Secondary | ICD-10-CM

## 2020-04-28 DIAGNOSIS — Z9181 History of falling: Secondary | ICD-10-CM

## 2020-04-28 DIAGNOSIS — Z7189 Other specified counseling: Secondary | ICD-10-CM | POA: Diagnosis not present

## 2020-04-28 DIAGNOSIS — R7989 Other specified abnormal findings of blood chemistry: Secondary | ICD-10-CM

## 2020-04-28 DIAGNOSIS — M159 Polyosteoarthritis, unspecified: Secondary | ICD-10-CM | POA: Diagnosis present

## 2020-04-28 DIAGNOSIS — I083 Combined rheumatic disorders of mitral, aortic and tricuspid valves: Secondary | ICD-10-CM | POA: Diagnosis present

## 2020-04-28 DIAGNOSIS — I4821 Permanent atrial fibrillation: Secondary | ICD-10-CM | POA: Diagnosis present

## 2020-04-28 DIAGNOSIS — R296 Repeated falls: Secondary | ICD-10-CM | POA: Diagnosis present

## 2020-04-28 DIAGNOSIS — E876 Hypokalemia: Secondary | ICD-10-CM | POA: Diagnosis present

## 2020-04-28 DIAGNOSIS — Z79899 Other long term (current) drug therapy: Secondary | ICD-10-CM

## 2020-04-28 DIAGNOSIS — R55 Syncope and collapse: Secondary | ICD-10-CM | POA: Diagnosis present

## 2020-04-28 DIAGNOSIS — Z6831 Body mass index (BMI) 31.0-31.9, adult: Secondary | ICD-10-CM | POA: Diagnosis not present

## 2020-04-28 DIAGNOSIS — I361 Nonrheumatic tricuspid (valve) insufficiency: Secondary | ICD-10-CM | POA: Diagnosis not present

## 2020-04-28 DIAGNOSIS — E785 Hyperlipidemia, unspecified: Secondary | ICD-10-CM | POA: Diagnosis present

## 2020-04-28 DIAGNOSIS — E778 Other disorders of glycoprotein metabolism: Secondary | ICD-10-CM | POA: Diagnosis present

## 2020-04-28 DIAGNOSIS — Z833 Family history of diabetes mellitus: Secondary | ICD-10-CM

## 2020-04-28 DIAGNOSIS — Z20822 Contact with and (suspected) exposure to covid-19: Secondary | ICD-10-CM | POA: Diagnosis present

## 2020-04-28 DIAGNOSIS — Z66 Do not resuscitate: Secondary | ICD-10-CM | POA: Diagnosis present

## 2020-04-28 DIAGNOSIS — I4892 Unspecified atrial flutter: Secondary | ICD-10-CM | POA: Diagnosis present

## 2020-04-28 DIAGNOSIS — R627 Adult failure to thrive: Secondary | ICD-10-CM | POA: Diagnosis present

## 2020-04-28 DIAGNOSIS — I110 Hypertensive heart disease with heart failure: Secondary | ICD-10-CM | POA: Diagnosis present

## 2020-04-28 DIAGNOSIS — Z8616 Personal history of COVID-19: Secondary | ICD-10-CM

## 2020-04-28 DIAGNOSIS — Z820 Family history of epilepsy and other diseases of the nervous system: Secondary | ICD-10-CM

## 2020-04-28 DIAGNOSIS — E860 Dehydration: Secondary | ICD-10-CM | POA: Diagnosis present

## 2020-04-28 DIAGNOSIS — I350 Nonrheumatic aortic (valve) stenosis: Secondary | ICD-10-CM | POA: Diagnosis not present

## 2020-04-28 DIAGNOSIS — I10 Essential (primary) hypertension: Secondary | ICD-10-CM | POA: Diagnosis present

## 2020-04-28 DIAGNOSIS — I77810 Thoracic aortic ectasia: Secondary | ICD-10-CM | POA: Diagnosis present

## 2020-04-28 DIAGNOSIS — R748 Abnormal levels of other serum enzymes: Secondary | ICD-10-CM | POA: Diagnosis present

## 2020-04-28 DIAGNOSIS — R9431 Abnormal electrocardiogram [ECG] [EKG]: Secondary | ICD-10-CM | POA: Diagnosis not present

## 2020-04-28 DIAGNOSIS — S7012XA Contusion of left thigh, initial encounter: Secondary | ICD-10-CM | POA: Diagnosis present

## 2020-04-28 DIAGNOSIS — A419 Sepsis, unspecified organism: Secondary | ICD-10-CM | POA: Diagnosis present

## 2020-04-28 DIAGNOSIS — I351 Nonrheumatic aortic (valve) insufficiency: Secondary | ICD-10-CM | POA: Diagnosis not present

## 2020-04-28 DIAGNOSIS — S79912A Unspecified injury of left hip, initial encounter: Secondary | ICD-10-CM | POA: Diagnosis present

## 2020-04-28 DIAGNOSIS — R34 Anuria and oliguria: Secondary | ICD-10-CM

## 2020-04-28 DIAGNOSIS — W19XXXA Unspecified fall, initial encounter: Secondary | ICD-10-CM | POA: Diagnosis not present

## 2020-04-28 DIAGNOSIS — M6282 Rhabdomyolysis: Secondary | ICD-10-CM | POA: Diagnosis present

## 2020-04-28 DIAGNOSIS — F0390 Unspecified dementia without behavioral disturbance: Secondary | ICD-10-CM | POA: Diagnosis present

## 2020-04-28 DIAGNOSIS — I480 Paroxysmal atrial fibrillation: Secondary | ICD-10-CM | POA: Diagnosis not present

## 2020-04-28 DIAGNOSIS — E038 Other specified hypothyroidism: Secondary | ICD-10-CM | POA: Diagnosis present

## 2020-04-28 DIAGNOSIS — N3281 Overactive bladder: Secondary | ICD-10-CM | POA: Diagnosis present

## 2020-04-28 DIAGNOSIS — F419 Anxiety disorder, unspecified: Secondary | ICD-10-CM | POA: Diagnosis present

## 2020-04-28 DIAGNOSIS — Z885 Allergy status to narcotic agent status: Secondary | ICD-10-CM

## 2020-04-28 DIAGNOSIS — W19XXXD Unspecified fall, subsequent encounter: Secondary | ICD-10-CM | POA: Diagnosis not present

## 2020-04-28 DIAGNOSIS — I483 Typical atrial flutter: Secondary | ICD-10-CM | POA: Diagnosis not present

## 2020-04-28 DIAGNOSIS — E440 Moderate protein-calorie malnutrition: Secondary | ICD-10-CM | POA: Diagnosis not present

## 2020-04-28 LAB — CBC
HCT: 38.7 % (ref 36.0–46.0)
Hemoglobin: 12.5 g/dL (ref 12.0–15.0)
MCH: 30.4 pg (ref 26.0–34.0)
MCHC: 32.3 g/dL (ref 30.0–36.0)
MCV: 94.2 fL (ref 80.0–100.0)
Platelets: 209 10*3/uL (ref 150–400)
RBC: 4.11 MIL/uL (ref 3.87–5.11)
RDW: 15.8 % — ABNORMAL HIGH (ref 11.5–15.5)
WBC: 6.6 10*3/uL (ref 4.0–10.5)
nRBC: 0 % (ref 0.0–0.2)

## 2020-04-28 LAB — URINALYSIS, ROUTINE W REFLEX MICROSCOPIC
Bilirubin Urine: NEGATIVE
Glucose, UA: NEGATIVE mg/dL
Ketones, ur: 5 mg/dL — AB
Leukocytes,Ua: NEGATIVE
Nitrite: NEGATIVE
Protein, ur: 100 mg/dL — AB
Specific Gravity, Urine: 1.023 (ref 1.005–1.030)
pH: 6 (ref 5.0–8.0)

## 2020-04-28 LAB — BASIC METABOLIC PANEL
Anion gap: 10 (ref 5–15)
BUN: 13 mg/dL (ref 8–23)
CO2: 31 mmol/L (ref 22–32)
Calcium: 8.9 mg/dL (ref 8.9–10.3)
Chloride: 98 mmol/L (ref 98–111)
Creatinine, Ser: 0.95 mg/dL (ref 0.44–1.00)
GFR, Estimated: 57 mL/min — ABNORMAL LOW (ref >60)
Glucose, Bld: 116 mg/dL — ABNORMAL HIGH (ref 70–99)
Potassium: 2.3 mmol/L — CL (ref 3.5–5.1)
Sodium: 139 mmol/L (ref 135–145)

## 2020-04-28 LAB — POC SARS CORONAVIRUS 2 AG -  ED: SARS Coronavirus 2 Ag: NEGATIVE

## 2020-04-28 MED ORDER — POTASSIUM CHLORIDE IN NACL 20-0.9 MEQ/L-% IV SOLN
Freq: Once | INTRAVENOUS | Status: AC
  Filled 2020-04-28: qty 1000

## 2020-04-28 MED ORDER — ASPIRIN 81 MG PO CHEW
81.0000 mg | CHEWABLE_TABLET | Freq: Every day | ORAL | Status: DC
  Administered 2020-04-29 – 2020-05-01 (×3): 81 mg via ORAL
  Filled 2020-04-28 (×3): qty 1

## 2020-04-28 MED ORDER — MEMANTINE HCL 10 MG PO TABS
10.0000 mg | ORAL_TABLET | Freq: Two times a day (BID) | ORAL | Status: DC
  Administered 2020-04-28 – 2020-05-05 (×14): 10 mg via ORAL
  Filled 2020-04-28 (×15): qty 1

## 2020-04-28 MED ORDER — ROSUVASTATIN CALCIUM 20 MG PO TABS: 20.0000 mg | ORAL_TABLET | Freq: Every day | ORAL | Status: DC

## 2020-04-28 MED ORDER — ACETAMINOPHEN 650 MG RE SUPP: 650.0000 mg | Freq: Four times a day (QID) | RECTAL | Status: DC | PRN

## 2020-04-28 MED ORDER — SERTRALINE HCL 100 MG PO TABS
100.0000 mg | ORAL_TABLET | Freq: Every day | ORAL | Status: DC
  Administered 2020-04-28 – 2020-05-10 (×13): 100 mg via ORAL
  Filled 2020-04-28 (×14): qty 1

## 2020-04-28 MED ORDER — POTASSIUM CHLORIDE CRYS ER 20 MEQ PO TBCR
40.0000 meq | EXTENDED_RELEASE_TABLET | ORAL | Status: AC
Start: 2020-04-28 — End: 2020-04-29
  Administered 2020-04-28 – 2020-04-29 (×2): 40 meq via ORAL
  Filled 2020-04-28 (×2): qty 2

## 2020-04-28 MED ORDER — DILTIAZEM HCL ER COATED BEADS 240 MG PO CP24: 240.0000 mg | ORAL_CAPSULE | Freq: Every day | ORAL | Status: DC

## 2020-04-28 MED ORDER — MEMANTINE HCL 10 MG PO TABS
10.0000 mg | ORAL_TABLET | Freq: Two times a day (BID) | ORAL | Status: DC
  Filled 2020-04-28: qty 1

## 2020-04-28 MED ORDER — ACETAMINOPHEN 325 MG PO TABS: 650.0000 mg | ORAL_TABLET | Freq: Four times a day (QID) | ORAL | Status: DC | PRN

## 2020-04-28 MED ORDER — METOPROLOL TARTRATE 25 MG PO TABS
12.5000 mg | ORAL_TABLET | Freq: Two times a day (BID) | ORAL | Status: DC
  Administered 2020-04-28 – 2020-04-29 (×2): 12.5 mg via ORAL
  Filled 2020-04-28 (×2): qty 1

## 2020-04-28 MED ORDER — DILTIAZEM HCL ER COATED BEADS 180 MG PO CP24
180.0000 mg | ORAL_CAPSULE | Freq: Every day | ORAL | Status: DC
  Administered 2020-04-29: 180 mg via ORAL
  Filled 2020-04-28: qty 1

## 2020-04-28 MED ORDER — DONEPEZIL HCL 10 MG PO TABS
10.0000 mg | ORAL_TABLET | Freq: Every day | ORAL | Status: DC
  Filled 2020-04-28: qty 1

## 2020-04-28 MED ORDER — HEPARIN SODIUM (PORCINE) 5000 UNIT/ML IJ SOLN
5000.0000 [IU] | Freq: Three times a day (TID) | INTRAMUSCULAR | Status: DC
  Administered 2020-04-29 (×2): 5000 [IU] via SUBCUTANEOUS
  Filled 2020-04-28 (×2): qty 1

## 2020-04-28 MED ORDER — DONEPEZIL HCL 10 MG PO TABS
10.0000 mg | ORAL_TABLET | Freq: Every day | ORAL | Status: DC
  Administered 2020-04-28 – 2020-05-04 (×7): 10 mg via ORAL
  Filled 2020-04-28 (×8): qty 1

## 2020-04-28 MED ORDER — MIRABEGRON ER 25 MG PO TB24
25.0000 mg | ORAL_TABLET | Freq: Every day | ORAL | Status: DC
  Administered 2020-04-29 – 2020-05-05 (×7): 25 mg via ORAL
  Filled 2020-04-28 (×7): qty 1

## 2020-04-28 MED ORDER — METOPROLOL TARTRATE 25 MG PO TABS: 12.5000 mg | ORAL_TABLET | Freq: Two times a day (BID) | ORAL | Status: DC

## 2020-04-28 NOTE — ED Triage Notes (Signed)
Family reports frequent falls over the last few weeks.  Found pt lying in floor this morning.  Pt denies pain/injury.  Denies dizziness.  Hx of dementia.

## 2020-04-28 NOTE — ED Notes (Signed)
Patient transported to MRI 

## 2020-04-28 NOTE — ED Notes (Signed)
Patient transported to CT 

## 2020-04-28 NOTE — H&P (Addendum)
[facility] Admission History and Physical Service Pager: [phone]  Patient name: [name] [name] Medical record number: [id number] Date of birth: 03/30/1928 Age: 87 y.o. Gender: female  Primary Care Provider: Merrilee Seashore, MD Consultants: None Code Status: DNR which was confirmed with family if patient unable to confirm  Preferred Emergency Contact: Daughter [name] [name]" Leilani Able [phone]  Chief Complaint: Found down   Assessment and Plan: [name] [name] is a 87 y.o. female presenting with recent fall and found down by daughter . PMH is significant for paroxysmal A. fib not on anticoagulation, aortic stenosis, HTN, hyperlipidemia, RA, dementia, depression.  Found down after fall Patient found down by daughter; patient had a 1 to 2-hour time period in which she was unsupervised.  Patient reports no known head injury but does have a history of dementia and cannot describe the fall.  Daughter does note that patient appears to be at her baseline cognitively.  Patient has had multiple recent falls in the last several months, last noted fall was 3/14 and prior fall with rhabdomyolysis in 2020.  Large hematoma on left lateral thigh and it is unknown when this occurred or if it was related to this most recent fall, no reported pain with palpation.  Neuro exam shows no focal deficits.  Left hip x-ray obtained showed concern for possible subtle nondisplaced subcapital fracture of the left femoral head, as this is something I can have intervention we will obtain a CT to confirm diagnosis. As the fall was unwitnessed and patient cannot accurately recall the details of the circumstances, we will do a syncope work-up.  Possible cardiogenic causes include heart failure (last echo in 08/2018 showed aortic stenosis, LVEF of 55 to 60%) and arrhythmia (in the setting of known PAF on rate control medications); orthostatic hypotension; thyroid disease; neurogenic causes  including seizure (family history of seizure, urinary incontinence in the setting of of frequent bladder, no fecal incontinence).  Fall also possibly secondary to weakness, malnutrition and poor gait control/MSK etiology-patient has previously been in rehab facilities after previous falls and would likely benefit from continued physical and occupational therapy.  Patient currently lives alone with consideration that this living situation may need to be reevaluated for the patient's safety.  We will admit for a syncope work-up in addition to working up possible injuries to her head and hip. -Admit to [facility], attending Dr. McDiarmid -Continuous cardiac telemetry and pulse ox -Follow-up echocardiogram -Follow-up orthostatic vitals -Labs: TSH, CMP, CK -CT head without contrast -CT left hip (hip x-ray showed possible nondisplaced fracture but was inconclusive) -PT/OT eval and treat -Out of bed with assistance -Fall precautions  Hypokalemia Critical potassium level of 2.3.  In the ED, repletion was ordered with 20 mEq in fluid.  Patient reportedly has had a significant decrease in appetite since her Covid infection in November 2021.  Of note, during hospitalization for Covid patient was noted to have hypokalemia of 2.7 on admission.  Likely secondary to poor oral intake and lack of nutrition. -Replete further with 40 mEq K-Dur x2 -CMP time for 1 AM, will replete as appropriate and repeat potassium measurements tomorrow -Follow-up mag and Phos  Paroxysmal Afib Not on chronic anticoagulation, which previous hospitalization notes noted that it was because she only had a single episode during hospital stay in 2020 with no recurrence.  On the telemetry monitors she is noted to have A. fib and physical exam pulse feels irregularly irregular with a recent EKG on admission showing a flutter.Home  medications include metoprolol 12.5 mg twice daily, diltiazem 180 mg. Of note, patient has had several instances in  which she does not take her metoprolol because the pill is very small one could have happened sometimes she does not notice it in the pill container.   -Continue home metoprolol and diltiazem -Holding home ASA until CT head negative for bleed  Cognitive decline  Dementia Patient currently lives alone and her daughter helps her most days and arranges her medications and finances.  Patient performs certain ADLs such as getting dressed in certain aspects of hygiene, that she sometimes needs help with bathing, patient does use the stove to cook boiled eggs, and administers her medications and food herself.  Patient does not currently drive secondary to difficulty with vision in the right eye. Currently follows with neurology.  Current home medications include donepezil 10mg  and memantine 10mg .  Of note, current medications also include oxybutynin-which is on the beers list and could possibly cause some mental status changes due to anticholinergic properties. -Continue home donepezil and memantine -Delirium precautions  Overactive bladder Medications include oxybutynin 10 mg daily, and Myrbetriq 25 mg daily.  We will hold home oxybutynin while hospitalized due to concerns of anticholinergic side effects as it is a beers list medication. -Continue home Myrbetriq -Hold oxybutynin  HTN BP on admission elevated to 150s over 90s.  Current home medications include diltiazem 180mg  and metoprolol 12.5mg  BID. -Continue home diltiazem and metoprolol  Hyperlipidemia Stable. -Hold home rosuvastatin 20 mg until CK has resulted and there is no concern for rhabdomyolysis  Depression Stable -sertraline   FEN/GI: Heart healthy diet Prophylaxis: SCDs until cleared by CT imaging and will transition to Lovenox  Disposition: Med telemetry, inpatient  History of Present Illness:  [name] [name] is a 87 y.o. female who was found down at home.  Her PMH is significant for HTN, dementia, paroxysmal A. fib,  overactive bladder, recurrent falls.  Daughter provided most of the history.  Her daughter reports that she left the appartment to go the a doctors appointment from about 10 am to 11 am.  When the daughter returned home, she found her mother fallen on the floor of the living room with her walker fallen to the floor a short distance away.  Ms. Mcfaul does not clearly remember the incident but believes that she did not hit her head.  She has had worsening mobility issues for months and currently uses a walker around the house.  Her right leg is weaker than her left and that leads to some trouble with ambulation.  She lives alone and her daughter provides a lot of help with her ADLs.  Her daughter notes that she has been declining more in the past months and been requiring additional help at home.  It is becoming difficult for her daughter to keep up with the necessary assistance at home, and the daughter is very concerned about the patient's safety.  Daughter does report that the patient has had a weight loss of about 10 to 15 pounds since she previously caught Covid in November secondary to decreased appetite. Daughter states that previously the patient did not have any issues with appetite.  In the ED she was found to have a potassium of 2.3. IV repletion of 20 mEq was provided and she was called for admission for additional care and social support.  Review Of Systems: Per HPI with the following additions:   Review of Systems  Constitutional: Negative for chills and fever.  HENT: Negative for rhinorrhea, sinus pressure, sinus pain, sore throat and trouble swallowing.   Eyes: Negative for visual disturbance.  Respiratory: Negative for cough, chest tightness and shortness of breath.   Cardiovascular: Negative for chest pain and palpitations.  Gastrointestinal: Negative for abdominal pain, constipation, diarrhea, nausea and vomiting.  Genitourinary: Negative for dysuria and urgency.  Skin: Positive  for wound.  Neurological: Positive for weakness (right leg weakness). Negative for dizziness, seizures, light-headedness and headaches.     Patient Active Problem List   Diagnosis Date Noted  . Dementia ([place]) 01/27/2020  . Pneumonia due to COVID-19 virus 12/23/2019  . Acute hypoxemic respiratory failure ([place]) 12/23/2019  . Hypokalemia 12/23/2019  . Transaminitis 12/23/2019  . Slow heart rate 03/13/2019  . Mild neurocognitive disorder 02/26/2019  . Clavi 01/20/2019  . Porokeratosis 09/02/2018  . Corns and callosities 09/02/2018  . Elevated CK   . Pressure injury of skin 08/27/2018  . PAF (paroxysmal atrial fibrillation) ([place]) 08/27/2018  . Hypernatremia   . Rhabdomyolysis 08/26/2018  . Normocytic anemia 08/26/2018  . Renal insufficiency 08/26/2018  . Community acquired pneumonia 01/13/2013  . Sepsis ([place]) 01/13/2013  . Hyponatremia 01/13/2013  . Hypochloremia 01/13/2013  . Rheumatoid arthritis ([place]) 01/13/2013  . Premature atrial contractions 07/22/2012  . Hyperlipidemia 07/22/2012  . Carotid artery bruit 07/22/2012  . Moderate aortic stenosis 07/22/2012  . Chest pain   . Essential hypertension     Past Medical History: Past Medical History:  Diagnosis Date  . Aortic stenosis   . Atrial fibrillation ([place])   . Carotid artery bruit 07/22/2012  . Chest pain    myoview 04/24/07-mild-mod perfusion defect with mild-mod superimposed ischemiain the mid inferior, apicla inferior, basal inferolateral and mid inferolateral regions  . Community acquired pneumonia 01/13/2013  . Dementia ([place]) 01/27/2020  . Edema   . Gallstone   . Generalized osteoarthritis   . HTN (hypertension)   . Hyperlipidemia   . Hypochloremia 01/13/2013  . Hyponatremia 01/13/2013  . IFG (impaired fasting glucose)   . Mild neurocognitive disorder 02/26/2019  . Nonspecific abnormal electrocardiogram (ECG) (EKG)   . Normocytic anemia 08/26/2018  . Porokeratosis 09/02/2018  . Premature atrial contractions 07/22/2012   . Renal insufficiency 08/26/2018  . Rhabdomyolysis 08/26/2018  . Rheumatoid arthritis ([place]) 01/13/2013  . Vertigo    as late effect of cerebrovascular disease    Past Surgical History: Past Surgical History:  Procedure Laterality Date  . APPENDECTOMY    . BUNIONECTOMY Bilateral   . cataract surgery Bilateral 05/2016  . gallstones removal    . TONSILLECTOMY      Social History: Social History   Tobacco Use  . Smoking status: Never Smoker  . Smokeless tobacco: Never Used  Vaping Use  . Vaping Use: Never used  Substance Use Topics  . Alcohol use: No  . Drug use: No   Additional social history:   Please also refer to relevant sections of EMR.  Family History: Family History  Problem Relation Age of Onset  . Diabetes Father   . Seizures Brother   . Alzheimer's disease Sister   . Cancer Sister   . Breast cancer Sister   . Cancer Sister   . Seizures Daughter   . Seizures Son   . Seizures Granddaughter   . Ovarian cancer Granddaughter   . Lung cancer Nephew   . Breast cancer Niece   . Breast cancer Niece   . Brain cancer Niece     Allergies and Medications: Allergies  Allergen  Reactions  . Codeine Nausea And Vomiting and Other (See Comments)    Dizziness   No current facility-administered medications on file prior to encounter.   Current Outpatient Medications on File Prior to Encounter  Medication Sig Dispense Refill  . albuterol (VENTOLIN HFA) 108 (90 Base) MCG/ACT inhaler Inhale 2 puffs into the lungs every 6 (six) hours as needed for wheezing or shortness of breath.    Marland Kitchen aspirin EC 81 MG tablet Take 81 mg by mouth daily.    Marland Kitchen diltiazem (CARDIZEM CD) 240 MG 24 hr capsule Take 1 capsule (240 mg total) by mouth daily.    Marland Kitchen donepezil (ARICEPT) 10 MG tablet Take 10 mg by mouth at bedtime.  4  . memantine (NAMENDA) 10 MG tablet Take 1 tablet (10 mg total) by mouth 2 (two) times daily. 180 tablet 3  . metoprolol tartrate (LOPRESSOR) 25 MG tablet Take 0.5 tablets  (12.5 mg total) by mouth 2 (two) times daily. 90 tablet 3  . mirabegron ER (MYRBETRIQ) 25 MG TB24 tablet Take 25 mg by mouth daily.    Marland Kitchen oxybutynin (DITROPAN-XL) 10 MG 24 hr tablet Take 10 mg by mouth daily.    . pantoprazole (PROTONIX) 40 MG tablet Take 1 tablet (40 mg total) by mouth daily.    . rosuvastatin (CRESTOR) 20 MG tablet Take 20 mg by mouth daily.    . sertraline (ZOLOFT) 100 MG tablet Take 100 mg by mouth daily.   3  . VITAMIN D PO Take 1 capsule by mouth daily.      Objective: BP (!) 135/107   Pulse (!) 30   Temp 98.2 F (36.8 C) (Oral)   Resp 18   SpO2 91%  Exam: General -- pleasant and cooperative, daughter at bedside providing most of the history. HEENT -- Head is normocephalic. Right pupil with decreased reactivity compared to the left pupil. EOMI. Nose and throat were benign. Neck -- supple; no bruits. Integument -- intact. Some healing hematomas located on b/l knees (noted by daughter from prior fall), very large hematoma on left lateral thigh (see photo below) Chest -- good expansion. Lungs clear to auscultation. Comfortable on room air, no increased WOB Cardiac -- irregularly irregular pulse, non-tachycardic. 3/6 systolic murmur present.  Abdomen -- soft, nontender. No masses palpable. Bowel sounds present. CNS -- cranial nerves II through XII grossly intact. 2+ reflexes bilaterally. Extremeties - no tenderness or effusions noted. ROM good. 5/5 strength BUE, 4/5 right LE strength with 5/5 left LE strength. Dorsalis pedis pulses present and symmetrical.          Labs and Imaging: CBC BMET  Recent Labs  Lab 04/28/20 1604  WBC 6.6  HGB 12.5  HCT 38.7  PLT 209   Recent Labs  Lab 04/28/20 1604  NA 139  K 2.3*  CL 98  CO2 31  BUN 13  CREATININE 0.95  GLUCOSE 116*  CALCIUM 8.9     EKG: irregularly irregular rhythm, non-tachycardic, a-flutter pattern in V1 but does not appear to be present in other leads, LAD, PVC noted  No results found.    Rise Patience, DO 04/28/2020, 7:48 PM PGY-1, [place] Intern pager: [phone], text pages welcome  FPTS Upper-Level Resident Addendum   I have independently interviewed and examined the patient. I have discussed the above with the original author and agree with their documentation. My edits for correction/addition/clarification are in blue. Please see also any attending notes.    Matilde Haymaker MD PGY-3,  [place] 04/28/2020 10:56 PM  FPTS Service pager: [phone] (text pages welcome through [facility])

## 2020-04-28 NOTE — ED Notes (Signed)
Dr. Zenia Resides notified of pt K+ level

## 2020-04-29 ENCOUNTER — Inpatient Hospital Stay (HOSPITAL_COMMUNITY): Payer: PPO

## 2020-04-29 DIAGNOSIS — I351 Nonrheumatic aortic (valve) insufficiency: Secondary | ICD-10-CM

## 2020-04-29 DIAGNOSIS — M069 Rheumatoid arthritis, unspecified: Secondary | ICD-10-CM

## 2020-04-29 DIAGNOSIS — R54 Age-related physical debility: Secondary | ICD-10-CM | POA: Diagnosis present

## 2020-04-29 DIAGNOSIS — W19XXXA Unspecified fall, initial encounter: Secondary | ICD-10-CM

## 2020-04-29 DIAGNOSIS — S79912A Unspecified injury of left hip, initial encounter: Secondary | ICD-10-CM | POA: Diagnosis present

## 2020-04-29 DIAGNOSIS — R55 Syncope and collapse: Secondary | ICD-10-CM

## 2020-04-29 DIAGNOSIS — F0390 Unspecified dementia without behavioral disturbance: Secondary | ICD-10-CM | POA: Diagnosis not present

## 2020-04-29 DIAGNOSIS — I483 Typical atrial flutter: Secondary | ICD-10-CM

## 2020-04-29 DIAGNOSIS — I480 Paroxysmal atrial fibrillation: Secondary | ICD-10-CM

## 2020-04-29 DIAGNOSIS — R748 Abnormal levels of other serum enzymes: Secondary | ICD-10-CM

## 2020-04-29 DIAGNOSIS — E860 Dehydration: Secondary | ICD-10-CM

## 2020-04-29 DIAGNOSIS — E46 Unspecified protein-calorie malnutrition: Secondary | ICD-10-CM | POA: Diagnosis present

## 2020-04-29 DIAGNOSIS — I361 Nonrheumatic tricuspid (valve) insufficiency: Secondary | ICD-10-CM | POA: Diagnosis not present

## 2020-04-29 DIAGNOSIS — I340 Nonrheumatic mitral (valve) insufficiency: Secondary | ICD-10-CM | POA: Diagnosis not present

## 2020-04-29 DIAGNOSIS — E876 Hypokalemia: Secondary | ICD-10-CM

## 2020-04-29 DIAGNOSIS — I350 Nonrheumatic aortic (valve) stenosis: Secondary | ICD-10-CM

## 2020-04-29 DIAGNOSIS — R7989 Other specified abnormal findings of blood chemistry: Secondary | ICD-10-CM

## 2020-04-29 DIAGNOSIS — E440 Moderate protein-calorie malnutrition: Secondary | ICD-10-CM

## 2020-04-29 DIAGNOSIS — I4892 Unspecified atrial flutter: Secondary | ICD-10-CM | POA: Diagnosis present

## 2020-04-29 DIAGNOSIS — I10 Essential (primary) hypertension: Secondary | ICD-10-CM

## 2020-04-29 LAB — ECHOCARDIOGRAM COMPLETE
AR max vel: 0.5 cm2
AV Area VTI: 0.5 cm2
AV Area mean vel: 0.5 cm2
AV Mean grad: 20 mmHg
AV Peak grad: 29.3 mmHg
Ao pk vel: 2.71 m/s
Area-P 1/2: 2.91 cm2
Height: 64 in
P 1/2 time: 288 msec
S' Lateral: 3.7 cm
Weight: 2560 oz

## 2020-04-29 LAB — BASIC METABOLIC PANEL
Anion gap: 9 (ref 5–15)
BUN: 13 mg/dL (ref 8–23)
CO2: 29 mmol/L (ref 22–32)
Calcium: 8.9 mg/dL (ref 8.9–10.3)
Chloride: 101 mmol/L (ref 98–111)
Creatinine, Ser: 1.06 mg/dL — ABNORMAL HIGH (ref 0.44–1.00)
GFR, Estimated: 50 mL/min — ABNORMAL LOW (ref >60)
Glucose, Bld: 186 mg/dL — ABNORMAL HIGH (ref 70–99)
Potassium: 3.3 mmol/L — ABNORMAL LOW (ref 3.5–5.1)
Sodium: 139 mmol/L (ref 135–145)

## 2020-04-29 LAB — COMPREHENSIVE METABOLIC PANEL
ALT: 20 U/L (ref 0–44)
AST: 37 U/L (ref 15–41)
Albumin: 3 g/dL — ABNORMAL LOW (ref 3.5–5.0)
Alkaline Phosphatase: 46 U/L (ref 38–126)
Anion gap: 8 (ref 5–15)
BUN: 14 mg/dL (ref 8–23)
CO2: 29 mmol/L (ref 22–32)
Calcium: 8.8 mg/dL — ABNORMAL LOW (ref 8.9–10.3)
Chloride: 102 mmol/L (ref 98–111)
Creatinine, Ser: 0.91 mg/dL (ref 0.44–1.00)
GFR, Estimated: >60 mL/min (ref >60)
Glucose, Bld: 162 mg/dL — ABNORMAL HIGH (ref 70–99)
Potassium: 2.8 mmol/L — ABNORMAL LOW (ref 3.5–5.1)
Sodium: 139 mmol/L (ref 135–145)
Total Bilirubin: 1.1 mg/dL (ref 0.3–1.2)
Total Protein: 5.9 g/dL — ABNORMAL LOW (ref 6.5–8.1)

## 2020-04-29 LAB — T4, FREE: Free T4: 0.73 ng/dL (ref 0.61–1.12)

## 2020-04-29 LAB — CK: Total CK: 268 U/L — ABNORMAL HIGH (ref 38–234)

## 2020-04-29 LAB — TSH: TSH: 7.115 u[IU]/mL — ABNORMAL HIGH (ref 0.350–4.500)

## 2020-04-29 LAB — MAGNESIUM: Magnesium: 1.5 mg/dL — ABNORMAL LOW (ref 1.7–2.4)

## 2020-04-29 LAB — PHOSPHORUS: Phosphorus: 2.2 mg/dL — ABNORMAL LOW (ref 2.5–4.6)

## 2020-04-29 MED ORDER — ROSUVASTATIN CALCIUM 20 MG PO TABS
20.0000 mg | ORAL_TABLET | Freq: Every day | ORAL | Status: DC
  Administered 2020-04-30 – 2020-05-03 (×4): 20 mg via ORAL
  Filled 2020-04-29 (×4): qty 1

## 2020-04-29 MED ORDER — POTASSIUM PHOSPHATES 15 MMOLE/5ML IV SOLN
30.0000 mmol | Freq: Once | INTRAVENOUS | Status: AC
  Administered 2020-04-29: 30 mmol via INTRAVENOUS
  Filled 2020-04-29: qty 10

## 2020-04-29 MED ORDER — MAGNESIUM SULFATE IN D5W 1-5 GM/100ML-% IV SOLN
1.0000 g | Freq: Once | INTRAVENOUS | Status: AC
  Administered 2020-04-29: 1 g via INTRAVENOUS
  Filled 2020-04-29: qty 100

## 2020-04-29 MED ORDER — POTASSIUM CHLORIDE 10 MEQ/100ML IV SOLN
10.0000 meq | INTRAVENOUS | Status: AC
  Administered 2020-04-29 (×3): 10 meq via INTRAVENOUS

## 2020-04-29 MED ORDER — METOPROLOL TARTRATE 25 MG/10 ML ORAL SUSPENSION
6.2500 mg | Freq: Two times a day (BID) | ORAL | Status: DC
  Filled 2020-04-29: qty 5

## 2020-04-29 MED ORDER — ONDANSETRON HCL 4 MG/2ML IJ SOLN: 4.0000 mg | Freq: Once | INTRAMUSCULAR | Status: AC

## 2020-04-29 MED ORDER — METOPROLOL TARTRATE 12.5 MG HALF TABLET
12.5000 mg | ORAL_TABLET | Freq: Two times a day (BID) | ORAL | Status: DC
  Administered 2020-04-29 – 2020-05-05 (×12): 12.5 mg via ORAL
  Filled 2020-04-29 (×12): qty 1

## 2020-04-29 MED ORDER — POTASSIUM CHLORIDE 10 MEQ/100ML IV SOLN
10.0000 meq | INTRAVENOUS | Status: AC
  Administered 2020-04-29: 10 meq via INTRAVENOUS
  Filled 2020-04-29 (×4): qty 100

## 2020-04-29 MED ORDER — ONDANSETRON HCL 4 MG/2ML IJ SOLN
INTRAMUSCULAR | Status: AC
  Administered 2020-04-29: 4 mg
  Filled 2020-04-29: qty 2

## 2020-04-29 MED ORDER — ENOXAPARIN SODIUM 40 MG/0.4ML ~~LOC~~ SOLN
40.0000 mg | Freq: Every day | SUBCUTANEOUS | Status: DC
  Administered 2020-04-30: 40 mg via SUBCUTANEOUS
  Filled 2020-04-29: qty 0.4

## 2020-04-29 MED ORDER — SODIUM CHLORIDE 0.9 % IV SOLN: INTRAVENOUS | Status: AC

## 2020-04-29 NOTE — Consult Note (Addendum)
Consult Note  Patient Name: [name] [name] Date of Encounter: 04/29/2020  Primary Cardiologist: Dr Quay Burow, MD  Subjective   [name] [name] is an 87 year old female with PMHx of paroxysmal atrial fibrillation not on anticoagulation, aortic stenosis, hypertension, hyperlipidemia, dementia, depression, rheumatoid arthritis admitted after being found on the ground following a mechanical fall. She reports that she usually ambulates using her walker. She endorses that she was attempting to sit down on her recliner but was too far out resulting in fall. She denies any chest pain, lightheadedness/dizziness, palpitations. She denies any head trauma or loss of consciousness. She does endorse ~10lb weight loss over the past couple months due to decreased appetite. Syncope work up initiated by admitting team including Echo which was significant for decreased LVEF 40-45%, global hypokinesis of left ventricle with moderate LV hypertrophy; RV systolic function also reduced with elevated RVSP. LA and RA severely dilated. Severe mitral valve and tricuspid valve regurgitation and severe aortic valve stenosis noted. Cardiology consulted.   Inpatient Medications    Scheduled Meds: . aspirin  81 mg Oral Daily  . donepezil  10 mg Oral QHS  . enoxaparin (LOVENOX) injection  40 mg Subcutaneous Daily  . memantine  10 mg Oral BID  . metoprolol tartrate  12.5 mg Oral BID  . mirabegron ER  25 mg Oral Daily  . rosuvastatin  20 mg Oral Daily  . sertraline  100 mg Oral Daily   Continuous Infusions: . sodium chloride 100 mL/hr at 04/29/20 1152  . potassium chloride     PRN Meds: acetaminophen **OR** acetaminophen   Vital Signs    Vitals:   04/29/20 1130 04/29/20 1200 04/29/20 1330 04/29/20 1501  BP: 104/88 128/81 119/87 102/65  Pulse: 83 (!) 36 64 80  Resp:   (!) 29 15  Temp:    98.1 F (36.7 C)  TempSrc:    Oral  SpO2: 99% 96% 95% 95%  Weight:      Height:        Intake/Output Summary  (Last 24 hours) at 04/29/2020 1537 Last data filed at 04/28/2020 2305 Gross per 24 hour  Intake 1000 ml  Output -  Net 1000 ml   Filed Weights   04/28/20 2047  Weight: 72.6 kg    Telemetry    Atrial fibrillation with multiple PVCs; HR 70's- Personally Reviewed  ECG    Atrial flutter with variable AV block, rate 80 - Personally Reviewed  Physical Exam  Physical Exam  Constitutional: Elderly female, thin, No distress.  HENT: Normocephalic and atraumatic, EOMI, conjunctiva normal, dry mucous membranes Cardiovascular: Normal rate, irregularly irregular rhythm, S1 and S2 present, +systolic murmur at 2nd R ICS and harsh holosystolic murmur at R sternal border and apex, no rubs or gallops.  Distal pulses intact Respiratory: No respiratory distress, no accessory muscle use.  Effort is normal.  Lungs are clear to auscultation bilaterally. GI: Nondistended, soft, nontender to palpation, active bowel sounds Musculoskeletal: Normal bulk and tone.  No peripheral edema noted. Neurological: Is alert and oriented x4, spontaneously moving all extremities Skin: Warm and dry.   Psychiatric: Normal mood and affect. Behavior is normal. Judgment and thought content normal.   Labs    Chemistry Recent Labs  Lab 04/28/20 1604 04/29/20 0125 04/29/20 0910  NA 139 139 139  K 2.3* 2.8* 3.3*  CL 98 102 101  CO2 31 29 29   GLUCOSE 116* 162* 186*  BUN 13 14 13   CREATININE 0.95 0.91 1.06*  CALCIUM 8.9 8.8* 8.9  PROT  --  5.9*  --   ALBUMIN  --  3.0*  --   AST  --  37  --   ALT  --  20  --   ALKPHOS  --  46  --   BILITOT  --  1.1  --   GFRNONAA 57* >60 50*  ANIONGAP 10 8 9      Hematology Recent Labs  Lab 04/28/20 1604  WBC 6.6  RBC 4.11  HGB 12.5  HCT 38.7  MCV 94.2  MCH 30.4  MCHC 32.3  RDW 15.8*  PLT 209    Cardiac EnzymesNo results for input(s): TROPONINI in the last 168 hours. No results for input(s): TROPIPOC in the last 168 hours.   BNPNo results for input(s): BNP, PROBNP  in the last 168 hours.   DDimer No results for input(s): DDIMER in the last 168 hours.   Radiology    CT HEAD WO CONTRAST  Result Date: 04/28/2020 CLINICAL DATA:  Head trauma, minor (Age >= 65y) Found down after fall Multiple recent falls.  Dementia patient. EXAM: CT HEAD WITHOUT CONTRAST TECHNIQUE: Contiguous axial images were obtained from the base of the skull through the vertex without intravenous contrast. COMPARISON:  Most recent head CT 03/01/2020 FINDINGS: Brain: Stable degree of chronic small vessel ischemia. Brain volume is normal for age. No intracranial hemorrhage, mass effect, or midline shift. No hydrocephalus. Incidental cavum septum pellucidum, normal variant anatomy. The basilar cisterns are patent. No evidence of territorial infarct or acute ischemia. Stable enlarged partially empty sella. No extra-axial or intracranial fluid collection. Vascular: Atherosclerosis of skullbase vasculature without hyperdense vessel or abnormal calcification. Skull: No fracture or focal lesion. Sinuses/Orbits: Paranasal sinuses and mastoid air cells are clear. The visualized orbits are unremarkable. Bilateral cataract resection. Other: None. IMPRESSION: 1. No acute intracranial abnormality. No skull fracture. 2. Stable degree of chronic small vessel ischemia. Electronically Signed   By: Keith Rake M.D.   On: 04/28/2020 22:54   CT HIP LEFT WO CONTRAST  Result Date: 04/29/2020 CLINICAL DATA:  Left hip pain, fall, abnormal radiograph EXAM: CT OF THE LEFT HIP WITHOUT CONTRAST TECHNIQUE: Multidetector CT imaging of the left hip was performed according to the standard protocol. Multiplanar CT image reconstructions were also generated. COMPARISON:  Plain radiograph 04/28/2020 FINDINGS: Bones/Joint/Cartilage Normal alignment of the left hip. No fracture or dislocation. Cortical irregularity noted on prior radiograph appears chronic in nature and is likely degenerative in nature. Superimposed moderate left  hip degenerative arthritis. Ligaments Suboptimally assessed by CT. Muscles and Tendons Normal muscle bulk. Ileus psoas, gluteal, and hamstring tendons appear intact. Soft tissues There is extensive soft tissue swelling seen within the soft tissues lateral to the greater trochanter. Limited images of the pelvis are age-appropriate. IMPRESSION: No acute fracture or dislocation of the left hip. Extensive lateral soft tissue swelling. Electronically Signed   By: Fidela Salisbury MD   On: 04/29/2020 00:44   ECHOCARDIOGRAM COMPLETE  Result Date: 04/29/2020    ECHOCARDIOGRAM REPORT   Patient Name:   [facility] CADDELL Omary Date of Exam: 04/29/2020 Medical Rec #:  [id number]            Height:       64.0 in Accession #:    [id number]           Weight:       160.0 lb Date of Birth:  Aug 05, 1928             BSA:  1.779 m Patient Age:    83 years             BP:           136/90 mmHg Patient Gender: F                    HR:           78 bpm. Exam Location:  Inpatient Procedure: 2D Echo, 3D Echo, Cardiac Doppler and Color Doppler Indications:    R55 Syncope  History:        Patient has prior history of Echocardiogram examinations, most                 recent 08/27/2018. Arrythmias:Atrial Fibrillation,                 Signs/Symptoms:Chest Pain; Risk Factors:Hypertension and                 Dyslipidemia. COVID-19.  Sonographer:    Jonelle Sidle Dance Referring Phys: Carter  1. Left ventricular ejection fraction, by estimation, is 40 to 45%. The left ventricle has mildly decreased function. The left ventricle demonstrates global hypokinesis. There is moderate left ventricular hypertrophy. Left ventricular diastolic parameters are indeterminate.  2. Right ventricular systolic function is mildly reduced. The right ventricular size is moderately enlarged. The estimated right ventricular systolic pressure is 32.4 mmHg.  3. Left atrial size was severely dilated.  4. Right atrial size was severely dilated.  5. The  mitral valve is grossly normal. Severe mitral valve regurgitation, suspected mechanism is secondary MR from LV dysfunction and atrial MR. No evidence of mitral stenosis.  6. Tricuspid valve regurgitation is severe. Incomplete coaptation of tricuspid valve leaflets likely from annular dilation.  7. The aortic valve is abnormal. There is severe calcifcation of the aortic valve. Aortic valve regurgitation is mild. Aortic valve mean gradient measures 20.0 mmHg. Low flow low gradient aortic valve stenosis. Based on valve appearance, there is at least moderate-severe aortic valve stenosis.  8. Aortic dilatation noted. There is mild dilatation of the ascending aorta, measuring 40 mm.  9. Cannot exclude atrial level shunt by color flow Doppler. FINDINGS  Left Ventricle: Left ventricular ejection fraction, by estimation, is 40 to 45%. The left ventricle has mildly decreased function. The left ventricle demonstrates global hypokinesis. The left ventricular internal cavity size was normal in size. There is  moderate left ventricular hypertrophy. Left ventricular diastolic parameters are indeterminate. Right Ventricle: The right ventricular size is moderately enlarged. Right vetricular wall thickness was not well visualized. Right ventricular systolic function is mildly reduced. The tricuspid regurgitant velocity is 2.06 m/s, and with an assumed right atrial pressure of 15 mmHg, the estimated right ventricular systolic pressure is 40.1 mmHg. Left Atrium: Left atrial size was severely dilated. Right Atrium: Right atrial size was severely dilated. Pericardium: Trivial pericardial effusion is present. Mitral Valve: The mitral valve is grossly normal. Severe mitral valve regurgitation. No evidence of mitral valve stenosis. Tricuspid Valve: The tricuspid valve is grossly normal. Tricuspid valve regurgitation is severe. Aortic Valve: The aortic valve is abnormal. There is severe calcifcation of the aortic valve. Aortic valve  regurgitation is mild. Aortic regurgitation PHT measures 288 msec. Aortic valve mean gradient measures 20.0 mmHg. Aortic valve peak gradient measures 29.3 mmHg. Aortic valve area, by VTI measures 0.50 cm. Pulmonic Valve: The pulmonic valve was normal in structure. Pulmonic valve regurgitation is mild to moderate. Aorta: Aortic dilatation noted. There is mild  dilatation of the ascending aorta, measuring 40 mm. IAS/Shunts: Cannot exclude atrial level shunt by color flow Doppler.  LEFT VENTRICLE PLAX 2D LVIDd:         4.90 cm LVIDs:         3.70 cm LV PW:         1.30 cm LV IVS:        1.30 cm LVOT diam:     1.80 cm LV SV:         26 LV SV Index:   15 LVOT Area:     2.54 cm  RIGHT VENTRICLE RV Basal diam:  3.80 cm RV Mid diam:    2.40 cm RV S prime:     9.14 cm/s TAPSE (M-mode): 1.4 cm LEFT ATRIUM              Index       RIGHT ATRIUM           Index LA diam:        5.20 cm  2.92 cm/m  RA Area:     31.90 cm LA Vol (A2C):   136.0 ml 76.43 ml/m RA Volume:   114.00 ml 64.07 ml/m LA Vol (A4C):   107.0 ml 60.13 ml/m LA Biplane Vol: 124.0 ml 69.69 ml/m  AORTIC VALVE AV Area (Vmax):    0.50 cm AV Area (Vmean):   0.50 cm AV Area (VTI):     0.50 cm AV Vmax:           270.75 cm/s AV Vmean:          197.500 cm/s AV VTI:            0.528 m AV Peak Grad:      29.3 mmHg AV Mean Grad:      20.0 mmHg LVOT Vmax:         53.33 cm/s LVOT Vmean:        38.500 cm/s LVOT VTI:          0.104 m LVOT/AV VTI ratio: 0.20 AI PHT:            288 msec  AORTA Ao Root diam: 3.60 cm Ao Asc diam:  4.00 cm MITRAL VALVE               TRICUSPID VALVE MV Area (PHT): 2.91 cm    TR Peak grad:   17.0 mmHg MV Decel Time: 261 msec    TR Vmax:        206.00 cm/s MV E velocity: 90.25 cm/s                            SHUNTS                            Systemic VTI:  0.10 m                            Systemic Diam: 1.80 cm Cherlynn Kaiser MD Electronically signed by Cherlynn Kaiser MD Signature Date/Time: 04/29/2020/12:04:34 PM    Final    DG HIP UNILAT  WITH PELVIS 2-3 VIEWS LEFT  Result Date: 04/28/2020 CLINICAL DATA:  Lateral hip pain post fall 1 day ago at home. No history of prior injuries or surgeries. Reported pain on the LEFT. EXAM: DG HIP (WITH OR WITHOUT PELVIS) 2-3V LEFT COMPARISON:  None. FINDINGS: No sign  of fracture about the bony pelvis.  Enthesopathy. Mild irregularity at the lateral margin of the femoral head seen only on one view on the LEFT. No sign of displacement. LEFT hip is located. IMPRESSION: Mild irregularity at the lateral margin of the LEFT femoral head seen only on one view. This could represent a subtle nondisplaced subcapital fracture. Electronically Signed   By: Zetta Bills M.D.   On: 04/28/2020 22:05    Cardiac Studies   IMPRESSIONS  1. Left ventricular ejection fraction, by estimation, is 40 to 45%. The  left ventricle has mildly decreased function. The left ventricle  demonstrates global hypokinesis. There is moderate left ventricular  hypertrophy. Left ventricular diastolic  parameters are indeterminate.  2. Right ventricular systolic function is mildly reduced. The right  ventricular size is moderately enlarged. The estimated right ventricular  systolic pressure is 93.7 mmHg.  3. Left atrial size was severely dilated.  4. Right atrial size was severely dilated.  5. The mitral valve is grossly normal. Severe mitral valve regurgitation,  suspected mechanism is secondary MR from LV dysfunction and atrial MR. No  evidence of mitral stenosis.  6. Tricuspid valve regurgitation is severe. Incomplete coaptation of  tricuspid valve leaflets likely from annular dilation.  7. The aortic valve is abnormal. There is severe calcifcation of the  aortic valve. Aortic valve regurgitation is mild. Aortic valve mean  gradient measures 20.0 mmHg. Low flow low gradient aortic valve stenosis.  Based on valve appearance, there is at  least moderate-severe aortic valve stenosis.  8. Aortic dilatation noted. There  is mild dilatation of the ascending  aorta, measuring 40 mm.  9. Cannot exclude atrial level shunt by color flow Doppler.   Patient Profile     87 y.o. female with PMHx of paroxysmal atrial fibrillation not on anticoagulation, aortic stenosis, hypertension, hyperlipidemia, dementia and depression admitted s/p fall. Syncope work up with new HFrEF to 40-45%, persistent aortic stenosis and new MR/TR. She is currently asymptomatic.  Assessment & Plan    Heart failure with reduced ejection fraction Severe mitral valve and tricuspid valve regurgitation, aortic stenosis  Patient presented with fall; syncope work up initiated for which Echo obtained. Echo with moderately reduced EF to 40-45%, persistent moderate-to-severe aortic stenosis, severe mitral valve and tricuspid valve regurgitation compared to Echo from 2020. Murmurs can be appreciated on examination. Patient appears hypovolemic. She denies any symptoms of chest pain, lightheadedness/dizziness on standing, palpitations. Given her advanced age and dementia, would recommend against invasive procedures and optimize medical therapy.  - Continue metoprolol 12.5mg  daily - Currently, BP's are soft but would recommend addition of ACEi/ARB vs ARNI and BiDil once BP can tolerate  - Given that she is currently asymptomatic, would have her follow up with cardiology as outpatient   Paroxysmal atrial fibrillation, rate controlled CHADS2-VASc score 4 (gender, age, hypertension), HAS-BLED score 2 (age, recurrent falls). She is not currently on anticoagulation and with moderately increased risk of bleeding, would recommend to hold off on this. She was on metoprolol and diltiazem for rate control; however, questionable whether she was actually taking these medications. Telemetry with rate controlled atrial fibrillation with intermittent PVCs.  - Continue rate control with metoprolol 12.5mg  - Will hold off on anticoagulation at this time - Continue cardiac  monitoring   Recurrent falls This is likely multifactorial in setting of her anticholinergic medication, arthritis, dementia, decreased oral intake resulting in electrolyte abnormalities and overall deconditioning.  - Minimize centrally acting medications  - Continue with electrolyte  replacement - IV fluids for dehydration - PT/OT eval  Signed, Harvie Heck, MD Internal Medicine, PGY-2 04/29/20 3:53 PM Pager # [phone]  Personally seen and examined. Agree with above.   87 year old female with moderate to severe aortic stenosis, severe mitral valve regurgitation here with fall.  Currently resting comfortably in bed.  Has rate controlled atrial fibrillation.  Ejection fraction was decreased at 40 to 45%.  On exam, 3/6 systolic murmur crescendo decrescendo, as well as apical harsh systolic murmur noted.  Irregularly irregular.  Severe mitral regurgitation as well as tricuspid regurgitation as well as moderate to severe aortic stenosis. -Currently being observed for syncope observation.  By her history, she was attempting to sit down in a recliner but was too far out resulting in her fall.  She denies any loss of consciousness.  She has been losing weight over the past few months.  She fell.  Thankfully, no fractures. -She is not hypoxic.  Rate is under good control.  Despite her moderate to severe valvular lesions, she does not demonstrate any cardiac distress. -Would continue with rate control of atrial fibrillation with metoprolol at this time.  Agree with discontinuing diltiazem given reduction in ejection fraction. -Given her underlying baseline, DNR status, memory impairment, does not appear to be a good candidate for further invasive evaluation.  We will continue with medical management.  Candee Furbish, MD

## 2020-04-29 NOTE — Progress Notes (Signed)
Pt a/o x4 on arrival, but can be forgetful.  Daughter planning to stay the night to help since pt gets confused more and more the later it gets.

## 2020-04-29 NOTE — Therapy (Signed)
[facility] [address] [place], [place], [address] Phone: [phone]   Fax:  [phone]  Patient Details  Name: [name] [name] MRN: [id number] Date of Birth: 1928-09-15 Referring Provider:  Debbora Presto NP  Encounter Date: 04/29/2020 SPEECH THERAPY DISCHARGE SUMMARY  Visits from Start of Care: 2  Current functional level related to goals / functional outcomes: [name] [name] was seen for 2 OPST visits to address cognitive linguistic deficits secondary to dementia. She demonstrated decreased safety awareness and short term recall, in which SLP introduced Spaced Retrieval Therapy and external memory compensations to increase recall and safety within her home environment. Minimal progress made towards ST goals due to limited visits prior to discharge to hospital. See goals below.     SLP Short Term Goals - 04/25/20 0845              SLP SHORT TERM GOAL #1    Title Pt will use external memory aids for daily schedule, medicine management, and other daily activities with min A between or in 2 sessions.     Time 4     Period Weeks     Status On-going          SLP SHORT TERM GOAL #2    Title Pt will recall to don alert necklace using external memory aids and Spaced Retrievel with min A from caregiver for 2 sessions     Time 4     Period Weeks     Status On-going          SLP SHORT TERM GOAL #3    Title Pt will recall needed items (i.e., reading glasses) and placed in walker bag given min A over 2 sessions     Time 4     Period Weeks     Status On-going                  SLP Long Term Goals - 04/25/20 0845         SLP LONG TERM GOAL #1    Title Pt will use external memory aids for daily schedule, medicine management, and other daily activities with rare min A between or in 3 sessions.     Time 8     Period Weeks     Status On-going          SLP LONG TERM GOAL #2    Title Pt will  recall to don alert necklace using external memory aids and Spaced Retrievel with rare min A from caregiver over 3 sessions     Time 8     Period Weeks     Status On-going          SLP LONG TERM GOAL #3    Title Caregiver will report decreased frequency for reminders for medications and pertinent information across 3 sessions     Time 8     Period Weeks     Status On-going                  Plan - 04/25/20 [place] presents today with mod-severe cognitive linguistic deficits secondary to dementia.  Daughter, [name] [name], reports patient fell over weekend and family found her down in her apartment. Pt did not use alert button independently and fall was not detected by alert system. Pt only recalled fall with max verbal prompting from SLP. SLP utilized Licensed conveyancer Therapy to train  use of alert necklace. SLP created and trained visual aids to trial at home to increase recall and safety. Due to decline in memory and decreased safety awareness impacting functional independence and safety at home, pt would benefit from skilled ST intervention to target memory compensations/external aids and family/caregiver education.     Speech Therapy Frequency 1x /week     Duration 8 weeks     Treatment/Interventions Compensatory strategies;Functional tasks;Patient/family education;Cueing hierarchy;Multimodal communcation approach;SLP instruction and feedback;Internal/external aids;Compensatory techniques     Potential to Achieve Goals Fair     Potential Considerations Co-morbidities     SLP Home Exercise Plan provided     Consulted and Agree with Plan of Care Patient;Family member/caregiver     Family Member Consulted [name] [name]          Remaining deficits: Deficits remain. Mod-severe cognitive linguistic deficits, decreased safety awareness, limited short term recall   Education / Equipment: Memory compensations, external aids, safety  precautions, caregiver education  Plan: Patient agrees to discharge.  Patient goals were not met. Patient is being discharged due to a change in medical status.  ?????       [name], MA CCC-SLP 04/29/2020, 3:58 PM  [facility] [address] [place], [place], [address] Phone: [phone]   Fax:  [phone]

## 2020-04-29 NOTE — Progress Notes (Signed)
Family Medicine Teaching Service Daily Progress Note Intern Pager: [phone]  Patient name: [name] [name] [name] record number: [id number] Date of birth: 07/31/1928 Age: 87 y.o. Gender: female  Primary Care Provider: Merrilee Seashore, MD Consultants: Cardiology Code Status: DNR  Pt Overview and Major Events to Date:  3/10 - Admitted  Assessment and Plan: [name] [name] a 87 y.o.femalewho was admitted s/p fall. PMH is significant for paroxysmal A. fib not on anticoagulation, aortic stenosis, HTN, hyperlipidemia, RA, dementia, depression.  Fall  Cognitive decline  Dementia Patient currently lives at home. Current PT recommendation is SNF or 24h supervision. Continued work with PT and disposition planning with the family.  - PT/OT following - Fall precautions - OOB with assistance - Continue home donepezil and memantine - NS IVF @ maintenance  HFrEF EF EF 40-45%. Patient would likely benefit from either an ACEi/ARB vs ARNI and BiDil once blood pressures are able to tolerate the medications. Crackles present in b/l bases, concern for some fluid overload, may consider decreasing fluids but currently has AKI so may continue fluids until tomorrow.  - Cardiology consulted, appreciate recs - Continue metoprolol 12.5mg  BID - Strict I/O's - Daily weights  AKI Creatinine bumped from 1.06>1.53. Patient is currently on NS @100mL /hr. Difficulty in balancing with patient significant heart failure vs current AKI. Elevation is likely in the setting of recent diarrhea and vomiting with concern for some element of dehydration. Physical exam did have fine crackles in b/l bases, no significant peripheral edema present. At this time likely would not add on a fluid bolus and may consider cutting back fluids if BMP tomorrow comes back improved. - Continue NS @100mL /hr - F/u AM BMP  Hypomagnesemia 1.8, goal is >2. - replete 1g  Paroxysmal A. Fib: chronic, stable - Continue  metoprolol 12.5mg  BID - Will touch base with daughter about consideration to d/c ASA  Overactive bladder: chronic, stable -Continue home Myrbetriq 25 mg daily -Holding home oxybutynin; recommend d/c on discharge due to BEERs criteria  Hypertension: chronic, stable BP's ranging 121-169/76-130. Most recent BP 136/90. See above for future HTN/HF medications once BP able to tolerate further medications. -Continue home diltiazem and metoprolol  Hyperlipidemia: chronic, stable -Continue home rosuvastatin 20 mg  Depression: chronic, stable -Continue home sertraline 100 mg daily  Subclinical hypothyroidism TSH 7.115, free T4 0.73. - No intervention at this time   FEN/GI: Heart healthy diet; NS @100mL /h PPx: Lovenox   Status is: Inpatient  Remains inpatient appropriate because:Persistent severe electrolyte disturbances and Unsafe d/c plan   Dispo: The patient is from: Home              Anticipated d/c is to: SNF              Patient currently is not medically stable to d/c.   Difficult to place patient No     Subjective:  Patient reports that she is not having any diarrhea or vomiting and that she has not had any since yesterday. Currently feels pretty good and has no complaints at this time.  Objective: Temp:  [97.7 F (36.5 C)-98.1 F (36.7 C)] 97.7 F (36.5 C) (03/12 0602) Pulse Rate:  [36-93] 80 (03/12 0602) Resp:  [15-29] 17 (03/12 0602) BP: (102-136)/(60-108) 104/60 (03/12 0602) SpO2:  [89 %-100 %] 98 % (03/12 0602) Physical Exam: General -- pleasant and cooperative. Daughter at bedside HEENT -- Head is normocephalic. EOMI Neck -- supple; no bruits. JVP <3cm from my estimation  Integument -- intact. No rash, erythema,  or ecchymoses.  Chest -- good aeration, fine crackles in b/l bases, no wheezing, comfortable on room air Cardiac -- irregularly irregularly with normal rate. No murmurs noted.  Abdomen -- soft, nontender. No masses palpable. Bowel sounds  present. Genital, rectal and breast exam -- deferred. Extremeties - no tenderness or effusions noted. SCDs present on b/l LE. Dorsalis pedis pulses present and symmetrical.   Laboratory: Recent Labs  Lab 04/28/20 1604 04/30/20 0240  WBC 6.6 7.3  HGB 12.5 11.9*  HCT 38.7 38.4  PLT 209 180   Recent Labs  Lab 04/29/20 0125 04/29/20 0910 04/30/20 0240  NA 139 139 137  K 2.8* 3.3* 4.2  CL 102 101 103  CO2 29 29 27   BUN 14 13 15   CREATININE 0.91 1.06* 1.53*  CALCIUM 8.8* 8.9 8.7*  PROT 5.9*  --   --   BILITOT 1.1  --   --   ALKPHOS 46  --   --   ALT 20  --   --   AST 37  --   --   GLUCOSE 162* 186* 113*     Imaging/Diagnostic Tests: ECHOCARDIOGRAM COMPLETE  Result Date: 04/29/2020    ECHOCARDIOGRAM REPORT   Patient Name:   [name] [name] Date of Exam: 04/29/2020 [name] Rec #:  [id number]            Height:       64.0 in Accession #:    [id number]           Weight:       160.0 lb Date of Birth:  1928-12-05             BSA:          1.779 m Patient Age:    87 years             BP:           136/90 mmHg Patient Gender: F                    HR:           78 bpm. Exam Location:  Inpatient Procedure: 2D Echo, 3D Echo, Cardiac Doppler and Color Doppler Indications:    R55 Syncope  History:        Patient has prior history of Echocardiogram examinations, most                 recent 08/27/2018. Arrythmias:Atrial Fibrillation,                 Signs/Symptoms:Chest Pain; Risk Factors:Hypertension and                 Dyslipidemia. COVID-19.  Sonographer:    [name] [name] Referring Phys: [name] [name]  1. Left ventricular ejection fraction, by estimation, is 40 to 45%. The left ventricle has mildly decreased function. The left ventricle demonstrates global hypokinesis. There is moderate left ventricular hypertrophy. Left ventricular diastolic parameters are indeterminate.  2. Right ventricular systolic function is mildly reduced. The right ventricular size is moderately  enlarged. The estimated right ventricular systolic pressure is 56.2 mmHg.  3. Left atrial size was severely dilated.  4. Right atrial size was severely dilated.  5. The mitral valve is grossly normal. Severe mitral valve regurgitation, suspected mechanism is secondary MR from LV dysfunction and atrial MR. No evidence of mitral stenosis.  6. Tricuspid valve regurgitation is severe. Incomplete coaptation of tricuspid valve leaflets likely from annular dilation.  7.  The aortic valve is abnormal. There is severe calcifcation of the aortic valve. Aortic valve regurgitation is mild. Aortic valve mean gradient measures 20.0 mmHg. Low flow low gradient aortic valve stenosis. Based on valve appearance, there is at least moderate-severe aortic valve stenosis.  8. Aortic dilatation noted. There is mild dilatation of the ascending aorta, measuring 40 mm.  9. Cannot exclude atrial level shunt by color flow Doppler. FINDINGS  Left Ventricle: Left ventricular ejection fraction, by estimation, is 40 to 45%. The left ventricle has mildly decreased function. The left ventricle demonstrates global hypokinesis. The left ventricular internal cavity size was normal in size. There is  moderate left ventricular hypertrophy. Left ventricular diastolic parameters are indeterminate. Right Ventricle: The right ventricular size is moderately enlarged. Right vetricular wall thickness was not well visualized. Right ventricular systolic function is mildly reduced. The tricuspid regurgitant velocity is 2.06 m/s, and with an assumed right atrial pressure of 15 mmHg, the estimated right ventricular systolic pressure is 94.8 mmHg. Left Atrium: Left atrial size was severely dilated. Right Atrium: Right atrial size was severely dilated. Pericardium: Trivial pericardial effusion is present. Mitral Valve: The mitral valve is grossly normal. Severe mitral valve regurgitation. No evidence of mitral valve stenosis. Tricuspid Valve: The tricuspid valve is  grossly normal. Tricuspid valve regurgitation is severe. Aortic Valve: The aortic valve is abnormal. There is severe calcifcation of the aortic valve. Aortic valve regurgitation is mild. Aortic regurgitation PHT measures 288 msec. Aortic valve mean gradient measures 20.0 mmHg. Aortic valve peak gradient measures 29.3 mmHg. Aortic valve area, by VTI measures 0.50 cm. Pulmonic Valve: The pulmonic valve was normal in structure. Pulmonic valve regurgitation is mild to moderate. Aorta: Aortic dilatation noted. There is mild dilatation of the ascending aorta, measuring 40 mm. IAS/Shunts: Cannot exclude atrial level shunt by color flow Doppler.  LEFT VENTRICLE PLAX 2D LVIDd:         4.90 cm LVIDs:         3.70 cm LV PW:         1.30 cm LV IVS:        1.30 cm LVOT diam:     1.80 cm LV SV:         26 LV SV Index:   15 LVOT Area:     2.54 cm  RIGHT VENTRICLE RV Basal diam:  3.80 cm RV Mid diam:    2.40 cm RV S prime:     9.14 cm/s TAPSE (M-mode): 1.4 cm LEFT ATRIUM              Index       RIGHT ATRIUM           Index LA diam:        5.20 cm  2.92 cm/m  RA Area:     31.90 cm LA Vol (A2C):   136.0 ml 76.43 ml/m RA Volume:   114.00 ml 64.07 ml/m LA Vol (A4C):   107.0 ml 60.13 ml/m LA Biplane Vol: 124.0 ml 69.69 ml/m  AORTIC VALVE AV Area (Vmax):    0.50 cm AV Area (Vmean):   0.50 cm AV Area (VTI):     0.50 cm AV Vmax:           270.75 cm/s AV Vmean:          197.500 cm/s AV VTI:            0.528 m AV Peak Grad:      29.3 mmHg AV Mean Grad:  20.0 mmHg LVOT Vmax:         53.33 cm/s LVOT Vmean:        38.500 cm/s LVOT VTI:          0.104 m LVOT/AV VTI ratio: 0.20 AI PHT:            288 msec  AORTA Ao Root diam: 3.60 cm Ao Asc diam:  4.00 cm MITRAL VALVE               TRICUSPID VALVE MV Area (PHT): 2.91 cm    TR Peak grad:   17.0 mmHg MV Decel Time: 261 msec    TR Vmax:        206.00 cm/s MV E velocity: 90.25 cm/s                            SHUNTS                            Systemic VTI:  0.10 m                             Systemic Diam: 1.80 cm Cherlynn Kaiser MD Electronically signed by Cherlynn Kaiser MD Signature Date/Time: 04/29/2020/12:04:34 PM    Final      Rise Patience, DO 04/30/2020, 8:21 AM PGY-1, [place] Intern pager: [phone], text pages welcome

## 2020-04-29 NOTE — ED Notes (Signed)
Lunch Tray Ordered @ 1001. 

## 2020-04-29 NOTE — ED Notes (Signed)
Pts daughter stated pt vomited, pt holding emesis bag with vomit visible. Further exam showed minimal amt of emesis, pt "a little nauseated." States "I can't eat this food here" Zofran 4mg  administered.

## 2020-04-29 NOTE — ED Notes (Signed)
Admitting MD notified of pt having diarrhea X3 today. Orders will be placed.

## 2020-04-29 NOTE — Hospital Course (Addendum)
[name] [name] is a 87 y.o. female who presented with recent fall, found down by daughter . PMH is significant for paroxysmal A. fib not on anticoagulation, aortic stenosis, HTN, hyperlipidemia, RA, dementia, depression.  Fall  Dementia Patient found down after 1-2 hours with no reported head injury though patient couldn't describe the event. CT head was obtained with no acute abnormalities. Significant hematoma of the left lateral thigh present, x-ray imaging showed possible nondisplaced subcapital fracture of the femoral head and further evaluated with CT that showed no fracture. Syncope could not be ruled out and and a full workup was completed with echo, orthostatic vitals, and labs. Echo with significant results as described below. Ultimately, fall was deemed to be from mechanical cause and not related to cardiac origin. Patient noted to have progressive worsening of cognitive functioning with increasing frequency of falls in the last several months. Patient was living alone at time of admission. Was evaluated by PT/OT during admission who recommended SNF placement. Palliative consult was placed and family decided on long term care facility with hospice. She transitioned to comfort care only 3/17.   HFpEF  Echo demonstrated EF of 40-45% with global hypokinesis of LV and moderate LVH. There was severe dilation of the left and right atria with severe mitral valve regurgitation and tricuspid regurgitation. Severe calcification of aortic valve with mild regurgitation. Heart failure was consulted and recommended to not pursue guideline directed medical therapy 2/2 her advanced age, inactivity and dementia. All cardiac medications discontinued.  Hypokalemia  Acute Kidney Injury Patient admitted with critical potassium level of 2.3 and was repleted appropriately. Upon discharge potassium was in normal range. Patient with worsening renal function throughout admission. Creatinine continued to increase.  Was cautiously given fluids given her poor cardiac output. Etiology thought to be due to worsening heart failure and decreased PO. FENa obtained which confirmed pre-renal origin. Renal U/S without hydronephrosis or obstruction. Further work up and nephrology consult was deferred after shared decision making with daughter and decision to keep patient comfortable.   Permanent Afib Patient not on chronic anticoagulation, noted to have afib on physical exam telemetry at time of admission. Home medications of diltiazem and metoprolol were initially continued during admission. Once echo returned with decreased EF, diltiazem was discontinued. All cardiac medications discontinued on discharge.  Depression Continue Zoloft 100mg  daily.   All other chronic conditions were stable.

## 2020-04-29 NOTE — ED Provider Notes (Signed)
[facility]  SURGICAL Provider Note   CSN: [id number] Arrival date & time: 04/28/20  1530     History Chief Complaint  Patient presents with  . Fall    [name] [name] is a 87 y.o. female.  HPI Next day chart completion   Is elderly female with worsening memory issues presenting with her daughter who provides much of the history.  Patient was found lying on the floor this morning.  Daughter notes the patient has had increasing frequency of falls, worsening weakness.  The patient answers specific questions with sustained answers, does not provide in-depth responses. Level 5 caveat secondary to dementia.  Daughter notes the patient's weakness has been progressive, seemingly generalized, with worsening confusion as well.  Past Medical History:  Diagnosis Date  . Aortic stenosis   . Atrial fibrillation ([place])   . Carotid artery bruit 07/22/2012  . Chest pain    myoview 04/24/07-mild-mod perfusion defect with mild-mod superimposed ischemiain the mid inferior, apicla inferior, basal inferolateral and mid inferolateral regions  . Community acquired pneumonia 01/13/2013  . Dementia ([place]) 01/27/2020  . Edema   . Gallstone   . Generalized osteoarthritis   . HTN (hypertension)   . Hyperlipidemia   . Hypochloremia 01/13/2013  . Hyponatremia 01/13/2013  . IFG (impaired fasting glucose)   . Mild neurocognitive disorder 02/26/2019  . Nonspecific abnormal electrocardiogram (ECG) (EKG)   . Normocytic anemia 08/26/2018  . Porokeratosis 09/02/2018  . Premature atrial contractions 07/22/2012  . Renal insufficiency 08/26/2018  . Rhabdomyolysis 08/26/2018  . Rheumatoid arthritis ([place]) 01/13/2013  . Vertigo    as late effect of cerebrovascular disease    Patient Active Problem List   Diagnosis Date Noted  . Atrial flutter ([place]) 04/29/2020  . Traumatic Soft tissue injury of hip, left, initial encounter 04/29/2020  . Hypomagnesemia 04/29/2020  . Protein calorie  malnutrition ([place]) 04/29/2020  . Frailty syndrome in geriatric patient 04/29/2020  . TSH elevation 04/29/2020  . Dehydration 04/29/2020  . Syncope, possible 04/29/2020  . Hypophosphatemia 04/29/2020  . Fall 04/28/2020  . Dementia ([place]) 01/27/2020  . Pneumonia due to COVID-19 virus 12/23/2019  . Acute hypoxemic respiratory failure ([place]) 12/23/2019  . Hypokalemia with generalized weakness 12/23/2019  . Transaminitis 12/23/2019  . Slow heart rate 03/13/2019  . Mild neurocognitive disorder 02/26/2019  . [place] 01/20/2019  . Porokeratosis 09/02/2018  . Corns and callosities 09/02/2018  . Elevated CK   . Pressure injury of skin 08/27/2018  . PAF (paroxysmal atrial fibrillation) ([place]) 08/27/2018  . Hypernatremia   . Rhabdomyolysis 08/26/2018  . Normocytic anemia 08/26/2018  . Renal insufficiency 08/26/2018  . Community acquired pneumonia 01/13/2013  . Sepsis, Rule Out 01/13/2013  . Hyponatremia 01/13/2013  . Hypochloremia 01/13/2013  . Rheumatoid arthritis ([place]) 01/13/2013  . Premature atrial contractions 07/22/2012  . Hyperlipidemia 07/22/2012  . Carotid artery bruit 07/22/2012  . Moderate aortic stenosis 07/22/2012  . Chest pain   . Essential hypertension     Past Surgical History:  Procedure Laterality Date  . APPENDECTOMY    . BUNIONECTOMY Bilateral   . cataract surgery Bilateral 05/2016  . gallstones removal    . TONSILLECTOMY       OB History   No obstetric history on file.     Family History  Problem Relation Age of Onset  . Diabetes Father   . Seizures Brother   . Alzheimer's disease Sister   . Cancer Sister   . Breast cancer Sister   .  Cancer Sister   . Seizures Daughter   . Seizures Son   . Seizures Granddaughter   . Ovarian cancer Granddaughter   . Lung cancer Nephew   . Breast cancer Niece   . Breast cancer Niece   . Brain cancer Niece     Social History   Tobacco Use  . Smoking status: Never Smoker  . Smokeless tobacco: Never Used  Vaping  Use  . Vaping Use: Never used  Substance Use Topics  . Alcohol use: No  . Drug use: No    Home Medications Prior to Admission medications   Medication Sig Start Date End Date Taking? Authorizing Provider  aspirin EC 81 MG tablet Take 81 mg by mouth daily.   Yes [provider]  calcium-vitamin D (OSCAL WITH D) 500-200 MG-UNIT tablet Take 1 tablet by mouth daily.   Yes [provider]  diltiazem (TIAZAC) 180 MG 24 hr capsule Take 180 mg by mouth daily.   Yes [provider]  donepezil (ARICEPT) 10 MG tablet Take 10 mg by mouth at bedtime. 12/04/17  Yes [provider]  memantine (NAMENDA) 10 MG tablet Take 1 tablet (10 mg total) by mouth 2 (two) times daily. 10/28/19  Yes Lomax, Amy, NP  metoprolol tartrate (LOPRESSOR) 25 MG tablet Take 12.5 mg by mouth 2 (two) times daily.   Yes [provider]  mirabegron ER (MYRBETRIQ) 25 MG TB24 tablet Take 25 mg by mouth daily.   Yes [provider]  oxybutynin (DITROPAN-XL) 10 MG 24 hr tablet Take 10 mg by mouth daily. 01/19/19  Yes [provider]  rosuvastatin (CRESTOR) 20 MG tablet Take 20 mg by mouth daily.   Yes [provider]  sertraline (ZOLOFT) 100 MG tablet Take 100 mg by mouth daily.  12/04/17  Yes [provider]  tobramycin (TOBREX) 0.3 % ophthalmic solution Place 1 drop into both eyes 3 (three) times daily.   Yes [provider]  VITAMIN D PO Take 1 capsule by mouth daily.   Yes [provider]  albuterol (VENTOLIN HFA) 108 (90 Base) MCG/ACT inhaler Inhale 2 puffs into the lungs every 6 (six) hours as needed for wheezing or shortness of breath. Patient not taking: No sig reported 12/29/19   Elgergawy, Silver Huguenin, MD  diltiazem (CARDIZEM CD) 240 MG 24 hr capsule Take 1 capsule (240 mg total) by mouth daily. Patient not taking: No sig reported 12/30/19   Elgergawy, Silver Huguenin, MD  pantoprazole (PROTONIX) 40 MG tablet Take 1 tablet (40 mg total) by  mouth daily. Patient not taking: Reported on 04/28/2020 12/30/19   Elgergawy, Silver Huguenin, MD    Allergies    Codeine  Review of Systems   Review of Systems  Unable to perform ROS: Dementia    Physical Exam Updated Vital Signs BP 114/82 (BP Location: Right Arm)   Pulse 68   Temp 97.9 F (36.6 C) (Oral)   Resp 16   Ht 5\' 4"  (1.626 m)   Wt 72.6 kg   SpO2 100%   BMI 27.46 kg/m   Physical Exam Vitals and nursing note reviewed.  Constitutional:      General: She is not in acute distress.    Appearance: She is well-developed.  HENT:     Head: Normocephalic and atraumatic.  Eyes:     Conjunctiva/sclera: Conjunctivae normal.  Cardiovascular:     Rate and Rhythm: Normal rate and regular rhythm.  Pulmonary:     Effort: Pulmonary effort is  normal. No respiratory distress.     Breath sounds: Normal breath sounds. No stridor.  Abdominal:     General: There is no distension.  Skin:    General: Skin is warm and dry.  Neurological:     Mental Status: She is alert and oriented to person, place, and time.     Cranial Nerves: No cranial nerve deficit.     Comments: Diffuse atrophy, no focal deficits, she does follow commands appropriately.  Face is symmetric, speech is brief, clear.  Psychiatric:        Behavior: Behavior is slowed and withdrawn.        Cognition and Memory: Cognition is impaired. Memory is impaired.     ED Results / Procedures / Treatments   Labs (all labs ordered are listed, but only abnormal results are displayed) Labs Reviewed  BASIC METABOLIC PANEL - Abnormal; Notable for the following components:      Result Value   Potassium 2.3 (*)    Glucose, Bld 116 (*)    GFR, Estimated 57 (*)    All other components within normal limits  CBC - Abnormal; Notable for the following components:   RDW 15.8 (*)    All other components within normal limits  URINALYSIS, ROUTINE W REFLEX MICROSCOPIC - Abnormal; Notable for the following components:   Color, Urine AMBER  (*)    APPearance HAZY (*)    Hgb urine dipstick MODERATE (*)    Ketones, ur 5 (*)    Protein, ur 100 (*)    Bacteria, UA RARE (*)    All other components within normal limits  MAGNESIUM - Abnormal; Notable for the following components:   Magnesium 1.5 (*)    All other components within normal limits  PHOSPHORUS - Abnormal; Notable for the following components:   Phosphorus 2.2 (*)    All other components within normal limits  CK - Abnormal; Notable for the following components:   Total CK 268 (*)    All other components within normal limits  COMPREHENSIVE METABOLIC PANEL - Abnormal; Notable for the following components:   Potassium 2.8 (*)    Glucose, Bld 162 (*)    Calcium 8.8 (*)    Total Protein 5.9 (*)    Albumin 3.0 (*)    All other components within normal limits  TSH - Abnormal; Notable for the following components:   TSH 7.115 (*)    All other components within normal limits  BASIC METABOLIC PANEL - Abnormal; Notable for the following components:   Potassium 3.3 (*)    Glucose, Bld 186 (*)    Creatinine, Ser 1.06 (*)    GFR, Estimated 50 (*)    All other components within normal limits  GASTROINTESTINAL PANEL BY PCR, STOOL (REPLACES STOOL CULTURE)  T4, FREE  T3, FREE  MAGNESIUM  PHOSPHORUS  BASIC METABOLIC PANEL  CBC  POC SARS CORONAVIRUS 2 AG -  ED    EKG EKG Interpretation  Date/Time:  Thursday April 28 2020 15:46:39 EST Ventricular Rate:  80 PR Interval:    QRS Duration: 102 QT Interval:  380 QTC Calculation: 438 R Axis:   -38 Text Interpretation: Atrial flutter with variable A-V block with premature ventricular or aberrantly conducted complexes Left axis deviation Left ventricular hypertrophy ( [place]Estes ) Abnormal ECG Confirmed by Carmin Muskrat [phone]) on 04/28/2020 6:05:14 PM   Radiology CT HEAD WO CONTRAST  Result Date: 04/28/2020 CLINICAL DATA:  Head trauma, minor (Age >= 65y) Found down after fall  Multiple recent falls.  Dementia  patient. EXAM: CT HEAD WITHOUT CONTRAST TECHNIQUE: Contiguous axial images were obtained from the base of the skull through the vertex without intravenous contrast. COMPARISON:  Most recent head CT 03/01/2020 FINDINGS: Brain: Stable degree of chronic small vessel ischemia. Brain volume is normal for age. No intracranial hemorrhage, mass effect, or midline shift. No hydrocephalus. Incidental cavum septum pellucidum, normal variant anatomy. The basilar cisterns are patent. No evidence of territorial infarct or acute ischemia. Stable enlarged partially empty sella. No extra-axial or intracranial fluid collection. Vascular: Atherosclerosis of skullbase vasculature without hyperdense vessel or abnormal calcification. Skull: No fracture or focal lesion. Sinuses/Orbits: Paranasal sinuses and mastoid air cells are clear. The visualized orbits are unremarkable. Bilateral cataract resection. Other: None. IMPRESSION: 1. No acute intracranial abnormality. No skull fracture. 2. Stable degree of chronic small vessel ischemia. Electronically Signed   By: Keith Rake M.D.   On: 04/28/2020 22:54   CT HIP LEFT WO CONTRAST  Result Date: 04/29/2020 CLINICAL DATA:  Left hip pain, fall, abnormal radiograph EXAM: CT OF THE LEFT HIP WITHOUT CONTRAST TECHNIQUE: Multidetector CT imaging of the left hip was performed according to the standard protocol. Multiplanar CT image reconstructions were also generated. COMPARISON:  Plain radiograph 04/28/2020 FINDINGS: Bones/Joint/Cartilage Normal alignment of the left hip. No fracture or dislocation. Cortical irregularity noted on prior radiograph appears chronic in nature and is likely degenerative in nature. Superimposed moderate left hip degenerative arthritis. Ligaments Suboptimally assessed by CT. Muscles and Tendons Normal muscle bulk. Ileus psoas, gluteal, and hamstring tendons appear intact. Soft tissues There is extensive soft tissue swelling seen within the soft tissues lateral to  the greater trochanter. Limited images of the pelvis are age-appropriate. IMPRESSION: No acute fracture or dislocation of the left hip. Extensive lateral soft tissue swelling. Electronically Signed   By: Fidela Salisbury MD   On: 04/29/2020 00:44   ECHOCARDIOGRAM COMPLETE  Result Date: 04/29/2020    ECHOCARDIOGRAM REPORT   Patient Name:   [facility] CADDELL Adachi Date of Exam: 04/29/2020 Medical Rec #:  [id number]            Height:       64.0 in Accession #:    [id number]           Weight:       160.0 lb Date of Birth:  [birth year]             BSA:          1.779 m Patient Age:    89 years             BP:           136/90 mmHg Patient Gender: F                    HR:           78 bpm. Exam Location:  Inpatient Procedure: 2D Echo, 3D Echo, Cardiac Doppler and Color Doppler Indications:    R55 Syncope  History:        Patient has prior history of Echocardiogram examinations, most                 recent 08/27/2018. Arrythmias:Atrial Fibrillation,                 Signs/Symptoms:Chest Pain; Risk Factors:Hypertension and                 Dyslipidemia. COVID-19.  Sonographer:    Jonelle Sidle Dance Referring Phys:  Ward Left ventricular ejection fraction, by estimation, is 40 to 45%. The left ventricle has mildly decreased function. The left ventricle demonstrates global hypokinesis. There is moderate left ventricular hypertrophy. Left ventricular diastolic parameters are indeterminate.  2. Right ventricular systolic function is mildly reduced. The right ventricular size is moderately enlarged. The estimated right ventricular systolic pressure is 09.7 mmHg.  3. Left atrial size was severely dilated.  4. Right atrial size was severely dilated.  5. The mitral valve is grossly normal. Severe mitral valve regurgitation, suspected mechanism is secondary MR from LV dysfunction and atrial MR. No evidence of mitral stenosis.  6. Tricuspid valve regurgitation is severe. Incomplete coaptation of tricuspid valve  leaflets likely from annular dilation.  7. The aortic valve is abnormal. There is severe calcifcation of the aortic valve. Aortic valve regurgitation is mild. Aortic valve mean gradient measures 20.0 mmHg. Low flow low gradient aortic valve stenosis. Based on valve appearance, there is at least moderate-severe aortic valve stenosis.  8. Aortic dilatation noted. There is mild dilatation of the ascending aorta, measuring 40 mm.  9. Cannot exclude atrial level shunt by color flow Doppler. FINDINGS  Left Ventricle: Left ventricular ejection fraction, by estimation, is 40 to 45%. The left ventricle has mildly decreased function. The left ventricle demonstrates global hypokinesis. The left ventricular internal cavity size was normal in size. There is  moderate left ventricular hypertrophy. Left ventricular diastolic parameters are indeterminate. Right Ventricle: The right ventricular size is moderately enlarged. Right vetricular wall thickness was not well visualized. Right ventricular systolic function is mildly reduced. The tricuspid regurgitant velocity is 2.06 m/s, and with an assumed right atrial pressure of 15 mmHg, the estimated right ventricular systolic pressure is 35.3 mmHg. Left Atrium: Left atrial size was severely dilated. Right Atrium: Right atrial size was severely dilated. Pericardium: Trivial pericardial effusion is present. Mitral Valve: The mitral valve is grossly normal. Severe mitral valve regurgitation. No evidence of mitral valve stenosis. Tricuspid Valve: The tricuspid valve is grossly normal. Tricuspid valve regurgitation is severe. Aortic Valve: The aortic valve is abnormal. There is severe calcifcation of the aortic valve. Aortic valve regurgitation is mild. Aortic regurgitation PHT measures 288 msec. Aortic valve mean gradient measures 20.0 mmHg. Aortic valve peak gradient measures 29.3 mmHg. Aortic valve area, by VTI measures 0.50 cm. Pulmonic Valve: The pulmonic valve was normal in  structure. Pulmonic valve regurgitation is mild to moderate. Aorta: Aortic dilatation noted. There is mild dilatation of the ascending aorta, measuring 40 mm. IAS/Shunts: Cannot exclude atrial level shunt by color flow Doppler.  LEFT VENTRICLE PLAX 2D LVIDd:         4.90 cm LVIDs:         3.70 cm LV PW:         1.30 cm LV IVS:        1.30 cm LVOT diam:     1.80 cm LV SV:         26 LV SV Index:   15 LVOT Area:     2.54 cm  RIGHT VENTRICLE RV Basal diam:  3.80 cm RV Mid diam:    2.40 cm RV S prime:     9.14 cm/s TAPSE (M-mode): 1.4 cm LEFT ATRIUM              Index       RIGHT ATRIUM           Index LA diam:  5.20 cm  2.92 cm/m  RA Area:     31.90 cm LA Vol (A2C):   136.0 ml 76.43 ml/m RA Volume:   114.00 ml 64.07 ml/m LA Vol (A4C):   107.0 ml 60.13 ml/m LA Biplane Vol: 124.0 ml 69.69 ml/m  AORTIC VALVE AV Area (Vmax):    0.50 cm AV Area (Vmean):   0.50 cm AV Area (VTI):     0.50 cm AV Vmax:           270.75 cm/s AV Vmean:          197.500 cm/s AV VTI:            0.528 m AV Peak Grad:      29.3 mmHg AV Mean Grad:      20.0 mmHg LVOT Vmax:         53.33 cm/s LVOT Vmean:        38.500 cm/s LVOT VTI:          0.104 m LVOT/AV VTI ratio: 0.20 AI PHT:            288 msec  AORTA Ao Root diam: 3.60 cm Ao Asc diam:  4.00 cm MITRAL VALVE               TRICUSPID VALVE MV Area (PHT): 2.91 cm    TR Peak grad:   17.0 mmHg MV Decel Time: 261 msec    TR Vmax:        206.00 cm/s MV E velocity: 90.25 cm/s                            SHUNTS                            Systemic VTI:  0.10 m                            Systemic Diam: 1.80 cm Cherlynn Kaiser MD Electronically signed by Cherlynn Kaiser MD Signature Date/Time: 04/29/2020/12:04:34 PM    Final    DG HIP UNILAT WITH PELVIS 2-3 VIEWS LEFT  Result Date: 04/28/2020 CLINICAL DATA:  Lateral hip pain post fall 1 day ago at home. No history of prior injuries or surgeries. Reported pain on the LEFT. EXAM: DG HIP (WITH OR WITHOUT PELVIS) 2-3V LEFT COMPARISON:  None.  FINDINGS: No sign of fracture about the bony pelvis.  Enthesopathy. Mild irregularity at the lateral margin of the femoral head seen only on one view on the LEFT. No sign of displacement. LEFT hip is located. IMPRESSION: Mild irregularity at the lateral margin of the LEFT femoral head seen only on one view. This could represent a subtle nondisplaced subcapital fracture. Electronically Signed   By: Zetta Bills M.D.   On: 04/28/2020 22:05    Procedures Procedures   Medications Ordered in ED Medications  sertraline (ZOLOFT) tablet 100 mg (100 mg Oral Given 04/29/20 1020)  mirabegron ER (MYRBETRIQ) tablet 25 mg (25 mg Oral Given 04/29/20 1020)  acetaminophen (TYLENOL) tablet 650 mg (has no administration in time range)    Or  acetaminophen (TYLENOL) suppository 650 mg (has no administration in time range)  donepezil (ARICEPT) tablet 10 mg (10 mg Oral Given 04/29/20 2108)  memantine (NAMENDA) tablet 10 mg (10 mg Oral Given 04/29/20 2108)  aspirin chewable tablet 81 mg (81 mg Oral Given 04/29/20 1019)  enoxaparin (LOVENOX) injection 40 mg (has no administration in time range)  0.9 %  sodium chloride infusion ( Intravenous New Bag/Given 04/29/20 2224)  rosuvastatin (CRESTOR) tablet 20 mg (0 mg Oral Hold 04/29/20 1315)  potassium chloride 10 mEq in 100 mL IVPB (10 mEq Intravenous New Bag/Given 04/29/20 1725)  metoprolol tartrate (LOPRESSOR) tablet 12.5 mg (12.5 mg Oral Given 04/29/20 2108)  potassium chloride 10 mEq in 100 mL IVPB (10 mEq Intravenous New Bag/Given 04/29/20 2225)  0.9 % NaCl with KCl 20 mEq/ L  infusion ( Intravenous Stopped 04/28/20 2305)  potassium chloride SA (KLOR-CON) CR tablet 40 mEq (40 mEq Oral Given 04/29/20 0220)  magnesium sulfate IVPB 1 g 100 mL (0 g Intravenous Stopped 04/29/20 0903)  potassium PHOSPHATE 30 mmol in dextrose 5 % 500 mL infusion (0 mmol Intravenous Stopped 04/29/20 1358)  ondansetron (ZOFRAN) injection 4 mg (4 mg Intravenous Given 04/29/20 1340)    ED Course  I  have reviewed the triage vital signs and the nursing notes.  Pertinent labs & imaging results that were available during my care of the patient were reviewed by me and considered in my medical decision making (see chart for details).  Elderly female presents with her daughter who provides much of the history as the patient has dementia. Here the patient is found to have critically abnormal potassium value, and given her worsening weakness there is some suspicion for her electrolyte abnormalities compared contributing to her frequent falls, weakness. No other early evidence for bacteremia, sepsis, pneumonia.  Patient did not have head CT immediately, as she was moving all extremities, following commands directly, there is no evidence for trauma and the most recent fall was earlier in the day. CT was scan was performed after admission orders, this is found to be unremarkable on chart review.  Fluid resuscitation accomplished with potassium IV and normal saline.  Final Clinical Impression(s) / ED Diagnoses Final diagnoses:  Fall  Hypokalemia  Weakness     Carmin Muskrat, MD 04/29/20 2323

## 2020-04-29 NOTE — Evaluation (Signed)
Physical Therapy Evaluation Patient Details Name: [name] [name] MRN: [id number] DOB: [birth year] Today's Date: 04/29/2020   History of Present Illness  87 yo with falls was brought to ED, has cognitive changes, dementia, EF 40-45%, CHF, has low K+, overactive bladder, HTN, PAF, elevated TSH.  PMHx:  HTN, depression, HLD, PAF, hypothyroidism,  Clinical Impression  Pt was seen for evaluation of mobility with walking on RW being reasonable but not safe alone.  Pt is living in [place] and will need to be assisted initially esp give her recent history of falling frequently.  Pt has low pulses and but sats and BP are in control.  Follow acutely to work on progression of mobility to default to home but for now asking for SNF due to her unsafe control of balance and additional complication of enteric infection.  Will work toward greater standing tolerance, strength of LE's and balance in both sitting and standing.      Follow Up Recommendations SNF;Supervision/Assistance - 24 hour    Equipment Recommendations  None recommended by PT    Recommendations for Other Services       Precautions / Restrictions Precautions Precautions: Fall Precaution Comments: pt has near incontinence Restrictions Weight Bearing Restrictions: No      Mobility  Bed Mobility Overal bed mobility: Needs Assistance Bed Mobility: Supine to Sit;Sit to Supine     Supine to sit: Min assist Sit to supine: Min assist   General bed mobility comments: min assist for trunk and legs    Transfers Overall transfer level: Needs assistance Equipment used: Rolling walker (2 wheeled);1 person hand held assist Transfers: Sit to/from Stand Sit to Stand: Min assist         General transfer comment: min assist to capture initial balance  Ambulation/Gait Ambulation/Gait assistance: Min assist Gait Distance (Feet): 10 Feet Assistive device: Rolling walker (2 wheeled);1 person hand held assist Gait  Pattern/deviations: Step-through pattern;Shuffle;Wide base of support Gait velocity: reduced Gait velocity interpretation: <1.31 ft/sec, indicative of household ambulator General Gait Details: pt is walking on walker with PT to support, able to maneuver walker with help to balance  Stairs            Wheelchair Mobility    Modified Rankin (Stroke Patients Only)       Balance Overall balance assessment: History of Falls;Needs assistance Sitting-balance support: Feet supported Sitting balance-Leahy Scale: Fair     Standing balance support: Bilateral upper extremity supported;During functional activity Standing balance-Leahy Scale: Poor                               Pertinent Vitals/Pain Pain Assessment: No/denies pain    Home Living Family/patient expects to be discharged to:: Private residence     Type of Home: Independent living facility Home Access: Level entry     Home Layout: One level Home Equipment: Tub bench;Cane - single point;Walker - 2 wheels;Grab bars - tub/shower;Bedside commode;Walker - 4 wheels Additional Comments: Has a life alert    Prior Function Level of Independence: Independent with assistive device(s)         Comments: ADLs and light IADLs. Daughter assists with IADLs and visits a lot. Drives very short distances. Goes to a lunch program for some meals     Hand Dominance   Dominant Hand: Right    Extremity/Trunk Assessment   Upper Extremity Assessment Upper Extremity Assessment: Generalized weakness    Lower Extremity Assessment Lower Extremity  Assessment: Generalized weakness    Cervical / Trunk Assessment Cervical / Trunk Assessment: Kyphotic  Communication   Communication: No difficulties  Cognition Arousal/Alertness: Awake/alert Behavior During Therapy: WFL for tasks assessed/performed Overall Cognitive Status: Within Functional Limits for tasks assessed                                  General Comments: pt is having falls for some unknown reasons      General Comments General comments (skin integrity, edema, etc.): pt is requiring help to steady herself and this is going to be challenging with being home alone    Exercises     Assessment/Plan    PT Assessment Patient needs continued PT services  PT Problem List Decreased strength;Decreased balance;Decreased activity tolerance;Cardiopulmonary status limiting activity       PT Treatment Interventions DME instruction;Gait training;Functional mobility training;Therapeutic activities;Therapeutic exercise;Balance training;Neuromuscular re-education;Patient/family education    PT Goals (Current goals can be found in the Care Plan section)  Acute Rehab PT Goals Patient Stated Goal: to get home safely PT Goal Formulation: With patient Time For Goal Achievement: 05/13/20 Potential to Achieve Goals: Good    Frequency Min 3X/week   Barriers to discharge Decreased caregiver support home in [place] PT "6 Clicks" Mobility  Outcome Measure Help needed turning from your back to your side while in a flat bed without using bedrails?: A Little Help needed moving from lying on your back to sitting on the side of a flat bed without using bedrails?: A Little Help needed moving to and from a bed to a chair (including a wheelchair)?: A Little Help needed standing up from a chair using your arms (e.g., wheelchair or bedside chair)?: A Little Help needed to walk in hospital room?: A Little Help needed climbing 3-5 steps with a railing? : Total 6 Click Score: 16    End of Session Equipment Utilized During Treatment: Gait belt Activity Tolerance: Patient tolerated treatment well Patient left: in bed;with call bell/phone within reach Nurse Communication: Mobility status PT Visit Diagnosis: Unsteadiness on feet (R26.81);Muscle weakness (generalized) (M62.81)    Time:  8938-1017 PT Time Calculation (min) (ACUTE ONLY): 21 min   Charges:   PT Evaluation $PT Eval Moderate Complexity: 1 Mod         Ramond Dial 04/29/2020, 6:37 PM Mee Hives, PT MS Acute Rehab Dept. Number: [place] [facility] [place]

## 2020-04-29 NOTE — Progress Notes (Signed)
Family Medicine Teaching Service Daily Progress Note Intern Pager: [phone]  Patient name: [name] [name] Medical record number: [id number] Date of birth: 1928-05-23 Age: 87 y.o. Gender: female  Primary Care Provider: Merrilee Seashore, MD Consultants: None Code Status: DNR  Pt Overview and Major Events to Date:  Admittted 3/10  Assessment and Plan: [name] is a 87 y.o. female who was admitted s/p fall. PMH is significant for paroxysmal A. fib not on anticoagulation, aortic stenosis, HTN, hyperlipidemia, RA, dementia, depression.  Fall  Cognitive Decline  Dementia CT of her left hip negative for fracture.  Does show extensive lateral soft tissue swelling.  Suspect that fall was likely due to her progressing cognitive decline. Echo shows EF 40-45% with global hypokinesis of LV and moderate LV hypertrophy. Also has severe aortic stenosis which could be contributory.  Unsure how long patient was down for though reassuringly her CK is only slightly elevated to 268. Patient also lives home alone-suspect that moving forward patient would be best served with more supervision at home or at [facility]  Patient is followed with neurology outpatient. -Follow-up echo -PT/OT eval and treat -Fall precautions -Out of bed with assistance -Continue home donepezil 10 mg daily -Continue home memantine 10 mg daily -NS IVF at maintenance  HFrEF EF40-45% Seen on Echo today. Home medications notable for Metoprolol, Diltiazem. Patient should not be on CCB in the setting of HFrEF. Additionally, she has been intermittently bradycardic and would be cautious in using beta-blocker in the setting of hypotension and frequent falls in a geriatric patient.  -D/c Dilt in setting of HFrEF -Cards consult, appreciate recommendations  Hypokalemia: Recurrent Critical potassium of 2.3 on admission.  Has been repleted with 20 mEq x 1 in ED, 40 mEq x 2 on thus far.  Repeat BMP this morning shows continued  hypokalemia 2.8, though was collected after only one supplementation with 81mEq. -Daily supplementation with 40 mEq BID -Ideally would like to keep potassium > 4 in the setting of PAF -Daily BMP  Hypophosphatemia Phosphorus 2.2 this AM. Currently receiving IV potassium phosphate 30 mmol in dextrose 5%  Hypomagnesemia Mg 1.5. Received 1g mag sulfate.  -Repeat Mg in AM  Elevated TSH TSH elevated 7.115. Hypothyroidism vs. Subclinical hypothyroidism. -Free T4  Paroxysmal A. Fib: chronic, stable Not on anticoagulation, patient is high fall risk. Continues to be in atrial fibrillation, rate controlled. CHA2DS2-VASc score of 4, with 4.8% stroke risk/year (due to hx HTN, female, and age). -Decrease Metoprolol 12.5 mg, consider d/c. Will consult cards -D/c diltiazem  -Will consider d/c aspirin 81 mg; will call daughter and do shared decision making  Overactive bladder: chronic, stable -Continue home Myrbetriq 25 mg daily -Holding home oxybutynin; recommend d/c on discharge due to BEERs criteria  Hypertension: chronic, stable BP's ranging 121-169/76-130. Most recent BP 136/90. -Continue home diltiazem and metoprolol  Hyperlipidemia: chronic, stable -Continue home rosuvastatin 20 mg  Depression: chronic, stable -Continue home sertraline 100 mg daily  FEN/GI: Heart healthy PPx: Lovenox   Status is: Inpatient  Remains inpatient appropriate because:Altered mental status, Ongoing diagnostic testing needed not appropriate for outpatient work up and Unsafe d/c plan   Dispo: The patient is from: Home              Anticipated d/c is to: Home vs SNF?              Patient currently is not medically stable to d/c.   Difficult to place patient No   Subjective:  Patient has  no complaints this morning.  States that she lives alone at home but is able to do all the necessary cavities.  She does admit to more frequent falls recently.  She tells me that she is in the hospital due to a fall  that happened on Wednesday.  She was on the ground for about 1 to 2 hours before she was found by her daughter.  She denies hitting her head.  Has no pain anywhere.  States that her last hospitalization was last year also after a fall.  Is frequently visited by her daughter.  She is not interested in living with her daughter, would like to go back home.   Objective: Temp:  [98.2 F (36.8 C)] 98.2 F (36.8 C) (03/10 1545) Pulse Rate:  [30-130] 47 (03/11 0600) Resp:  [16-30] 26 (03/11 0600) BP: (121-169)/(76-130) 139/92 (03/11 0600) SpO2:  [82 %-100 %] 100 % (03/11 0600) Weight:  [72.6 kg] 72.6 kg (03/10 2047) Physical Exam: General: Awake, alert and oriented x4, no distress, pleasant Cardiovascular: Irregularly irregular with normal rate Respiratory: Clear to auscultation bilaterally, good air movement, no wheezing/rhonchi/rales Abdomen: Soft, nontender in all quadrants, nondistended, no rebound/guarding Extremities: No edema to bilateral lower extremities, warm and well-perfused, 2+ radial and DP pulses bilaterally  Laboratory: Recent Labs  Lab 04/28/20 1604  WBC 6.6  HGB 12.5  HCT 38.7  PLT 209   Recent Labs  Lab 04/28/20 1604 04/29/20 0125  NA 139 139  K 2.3* 2.8*  CL 98 102  CO2 31 29  BUN 13 14  CREATININE 0.95 0.91  CALCIUM 8.9 8.8*  PROT  --  5.9*  BILITOT  --  1.1  ALKPHOS  --  46  ALT  --  20  AST  --  37  GLUCOSE 116* 162*    Imaging/Diagnostic Tests: 3/10: CT L Hip w/o Contrast IMPRESSION: No acute fracture or dislocation of the left hip. Extensive lateral soft tissue swelling.  Sharion Settler, DO 04/29/2020, 6:27 AM PGY-1, [place] Intern pager: [phone], text pages welcome

## 2020-04-29 NOTE — Progress Notes (Signed)
  Echocardiogram 2D Echocardiogram has been performed.  Keylee Shrestha G Sana Tessmer 04/29/2020, 10:14 AM

## 2020-04-30 DIAGNOSIS — E876 Hypokalemia: Secondary | ICD-10-CM | POA: Diagnosis not present

## 2020-04-30 DIAGNOSIS — F0390 Unspecified dementia without behavioral disturbance: Secondary | ICD-10-CM | POA: Diagnosis not present

## 2020-04-30 LAB — BASIC METABOLIC PANEL
Anion gap: 7 (ref 5–15)
BUN: 15 mg/dL (ref 8–23)
CO2: 27 mmol/L (ref 22–32)
Calcium: 8.7 mg/dL — ABNORMAL LOW (ref 8.9–10.3)
Chloride: 103 mmol/L (ref 98–111)
Creatinine, Ser: 1.53 mg/dL — ABNORMAL HIGH (ref 0.44–1.00)
GFR, Estimated: 32 mL/min — ABNORMAL LOW (ref >60)
Glucose, Bld: 113 mg/dL — ABNORMAL HIGH (ref 70–99)
Potassium: 4.2 mmol/L (ref 3.5–5.1)
Sodium: 137 mmol/L (ref 135–145)

## 2020-04-30 LAB — CBC
HCT: 38.4 % (ref 36.0–46.0)
Hemoglobin: 11.9 g/dL — ABNORMAL LOW (ref 12.0–15.0)
MCH: 29.6 pg (ref 26.0–34.0)
MCHC: 31 g/dL (ref 30.0–36.0)
MCV: 95.5 fL (ref 80.0–100.0)
Platelets: 180 10*3/uL (ref 150–400)
RBC: 4.02 MIL/uL (ref 3.87–5.11)
RDW: 15.9 % — ABNORMAL HIGH (ref 11.5–15.5)
WBC: 7.3 10*3/uL (ref 4.0–10.5)
nRBC: 0 % (ref 0.0–0.2)

## 2020-04-30 LAB — T3, FREE: T3, Free: 2.2 pg/mL (ref 2.0–4.4)

## 2020-04-30 LAB — PHOSPHORUS: Phosphorus: 4.2 mg/dL (ref 2.5–4.6)

## 2020-04-30 LAB — MAGNESIUM: Magnesium: 1.8 mg/dL (ref 1.7–2.4)

## 2020-04-30 MED ORDER — JUVEN PO PACK
1.0000 | PACK | Freq: Two times a day (BID) | ORAL | Status: DC
  Administered 2020-05-01 – 2020-05-03 (×5): 1 via ORAL
  Filled 2020-04-30 (×6): qty 1

## 2020-04-30 MED ORDER — MAGNESIUM SULFATE IN D5W 1-5 GM/100ML-% IV SOLN
1.0000 g | Freq: Once | INTRAVENOUS | Status: AC
  Administered 2020-04-30: 1 g via INTRAVENOUS
  Filled 2020-04-30: qty 100

## 2020-04-30 MED ORDER — ENSURE ENLIVE PO LIQD
237.0000 mL | Freq: Two times a day (BID) | ORAL | Status: DC
  Administered 2020-05-01 – 2020-05-05 (×9): 237 mL via ORAL

## 2020-04-30 MED ORDER — ENOXAPARIN SODIUM 30 MG/0.3ML ~~LOC~~ SOLN
30.0000 mg | Freq: Every day | SUBCUTANEOUS | Status: DC
  Administered 2020-05-01 – 2020-05-05 (×5): 30 mg via SUBCUTANEOUS
  Filled 2020-04-30 (×5): qty 0.3

## 2020-04-30 MED ORDER — ADULT MULTIVITAMIN W/MINERALS CH
1.0000 | ORAL_TABLET | Freq: Every day | ORAL | Status: DC
  Administered 2020-04-30 – 2020-05-05 (×6): 1 via ORAL
  Filled 2020-04-30 (×6): qty 1

## 2020-04-30 NOTE — Progress Notes (Signed)
CSW attempted to discuss SNF recommendation with patient, and patient's son at bedside said to call the patient's daughter, [name] [name], to discuss, as she handles the patient's affairs. CSW contacted [name] via phone, left a voicemail. Awaiting a call back.  Laveda Abbe, [place] Clinical Social Worker [phone]

## 2020-04-30 NOTE — Progress Notes (Signed)
Cardiology Progress Note  Patient ID: [name] [name] MRN: [id number] DOB: 10-Oct-1928 Date of Encounter: 04/30/2020  Primary Cardiologist: Quay Burow, MD  Subjective   Chief Complaint: None.  HPI: No complaints this morning.  Telemetry unremarkable.  ROS:  All other ROS reviewed and negative. Pertinent positives noted in the HPI.     Inpatient Medications  Scheduled Meds: . aspirin  81 mg Oral Daily  . donepezil  10 mg Oral QHS  . enoxaparin (LOVENOX) injection  40 mg Subcutaneous Daily  . memantine  10 mg Oral BID  . metoprolol tartrate  12.5 mg Oral BID  . mirabegron ER  25 mg Oral Daily  . rosuvastatin  20 mg Oral Daily  . sertraline  100 mg Oral Daily   Continuous Infusions: . sodium chloride 100 mL/hr at 04/30/20 0602   PRN Meds: acetaminophen **OR** acetaminophen   Vital Signs   Vitals:   04/29/20 1700 04/29/20 2155 04/30/20 0209 04/30/20 0602  BP: 110/65 114/82 116/76 104/60  Pulse: 71 68 78 80  Resp: 16 16 17 17   Temp: 97.7 F (36.5 C) 97.9 F (36.6 C) 97.7 F (36.5 C) 97.7 F (36.5 C)  TempSrc: Oral Oral Oral Oral  SpO2: 94% 100% 98% 98%  Weight:      Height:        Intake/Output Summary (Last 24 hours) at 04/30/2020 1115 Last data filed at 04/30/2020 0524 Gross per 24 hour  Intake 1870.74 ml  Output --  Net 1870.74 ml   Last 3 Weights 04/28/2020 02/29/2020 02/25/2020  Weight (lbs) 160 lb 169 lb 9.6 oz 166 lb 12.8 oz  Weight (kg) 72.576 kg 76.93 kg 75.66 kg      Telemetry  Overnight telemetry shows atrial fibrillation with heart rate in the 60s., which I personally reviewed.   ECG  The most recent ECG shows atrial fibrillation, heart rate 80, nonspecific ST-T changes, which I personally reviewed.   Physical Exam   Vitals:   04/29/20 1700 04/29/20 2155 04/30/20 0209 04/30/20 0602  BP: 110/65 114/82 116/76 104/60  Pulse: 71 68 78 80  Resp: 16 16 17 17   Temp: 97.7 F (36.5 C) 97.9 F (36.6 C) 97.7 F (36.5 C) 97.7 F (36.5 C)   TempSrc: Oral Oral Oral Oral  SpO2: 94% 100% 98% 98%  Weight:      Height:         Intake/Output Summary (Last 24 hours) at 04/30/2020 1115 Last data filed at 04/30/2020 0524 Gross per 24 hour  Intake 1870.74 ml  Output --  Net 1870.74 ml    Last 3 Weights 04/28/2020 02/29/2020 02/25/2020  Weight (lbs) 160 lb 169 lb 9.6 oz 166 lb 12.8 oz  Weight (kg) 72.576 kg 76.93 kg 75.66 kg    Body mass index is 27.46 kg/m.  General: Well nourished, well developed, in no acute distress Head: Atraumatic, normal size  Eyes: PEERLA, EOMI  Neck: Supple, no JVD Endocrine: No thryomegaly Cardiac: Normal S1, S2; 3 out of 6 holosystolic murmur, irregular rhythm Lungs: Clear to auscultation bilaterally, no wheezing, rhonchi or rales  Abd: Soft, nontender, no hepatomegaly  Ext: No edema, pulses 2+ Musculoskeletal: No deformities, BUE and BLE strength normal and equal Skin: Warm and dry, no rashes   Neuro: Alert, awake, oriented to person only  Labs  High Sensitivity Troponin:  No results for input(s): TROPONINIHS in the last 720 hours.   Cardiac EnzymesNo results for input(s): TROPONINI in the last 168 hours. No results for  input(s): TROPIPOC in the last 168 hours.  Chemistry Recent Labs  Lab 04/29/20 0125 04/29/20 0910 04/30/20 0240  NA 139 139 137  K 2.8* 3.3* 4.2  CL 102 101 103  CO2 29 29 27   GLUCOSE 162* 186* 113*  BUN 14 13 15   CREATININE 0.91 1.06* 1.53*  CALCIUM 8.8* 8.9 8.7*  PROT 5.9*  --   --   ALBUMIN 3.0*  --   --   AST 37  --   --   ALT 20  --   --   ALKPHOS 46  --   --   BILITOT 1.1  --   --   GFRNONAA >60 50* 32*  ANIONGAP 8 9 7     Hematology Recent Labs  Lab 04/28/20 1604 04/30/20 0240  WBC 6.6 7.3  RBC 4.11 4.02  HGB 12.5 11.9*  HCT 38.7 38.4  MCV 94.2 95.5  MCH 30.4 29.6  MCHC 32.3 31.0  RDW 15.8* 15.9*  PLT 209 180   BNPNo results for input(s): BNP, PROBNP in the last 168 hours.  DDimer No results for input(s): DDIMER in the last 168 hours.    Radiology  CT HEAD WO CONTRAST  Result Date: 04/28/2020 CLINICAL DATA:  Head trauma, minor (Age >= 65y) Found down after fall Multiple recent falls.  Dementia patient. EXAM: CT HEAD WITHOUT CONTRAST TECHNIQUE: Contiguous axial images were obtained from the base of the skull through the vertex without intravenous contrast. COMPARISON:  Most recent head CT 03/01/2020 FINDINGS: Brain: Stable degree of chronic small vessel ischemia. Brain volume is normal for age. No intracranial hemorrhage, mass effect, or midline shift. No hydrocephalus. Incidental cavum septum pellucidum, normal variant anatomy. The basilar cisterns are patent. No evidence of territorial infarct or acute ischemia. Stable enlarged partially empty sella. No extra-axial or intracranial fluid collection. Vascular: Atherosclerosis of skullbase vasculature without hyperdense vessel or abnormal calcification. Skull: No fracture or focal lesion. Sinuses/Orbits: Paranasal sinuses and mastoid air cells are clear. The visualized orbits are unremarkable. Bilateral cataract resection. Other: None. IMPRESSION: 1. No acute intracranial abnormality. No skull fracture. 2. Stable degree of chronic small vessel ischemia. Electronically Signed   By: Keith Rake M.D.   On: 04/28/2020 22:54   CT HIP LEFT WO CONTRAST  Result Date: 04/29/2020 CLINICAL DATA:  Left hip pain, fall, abnormal radiograph EXAM: CT OF THE LEFT HIP WITHOUT CONTRAST TECHNIQUE: Multidetector CT imaging of the left hip was performed according to the standard protocol. Multiplanar CT image reconstructions were also generated. COMPARISON:  Plain radiograph 04/28/2020 FINDINGS: Bones/Joint/Cartilage Normal alignment of the left hip. No fracture or dislocation. Cortical irregularity noted on prior radiograph appears chronic in nature and is likely degenerative in nature. Superimposed moderate left hip degenerative arthritis. Ligaments Suboptimally assessed by CT. Muscles and Tendons Normal  muscle bulk. Ileus psoas, gluteal, and hamstring tendons appear intact. Soft tissues There is extensive soft tissue swelling seen within the soft tissues lateral to the greater trochanter. Limited images of the pelvis are age-appropriate. IMPRESSION: No acute fracture or dislocation of the left hip. Extensive lateral soft tissue swelling. Electronically Signed   By: Fidela Salisbury MD   On: 04/29/2020 00:44   ECHOCARDIOGRAM COMPLETE  Result Date: 04/29/2020    ECHOCARDIOGRAM REPORT   Patient Name:   [name] [name] Date of Exam: 04/29/2020 Medical Rec #:  [id number]            Height:       64.0 in Accession #:  [id number]           Weight:       160.0 lb Date of Birth:  [birth year]             BSA:          1.779 m Patient Age:    68 years             BP:           136/90 mmHg Patient Gender: F                    HR:           78 bpm. Exam Location:  Inpatient Procedure: 2D Echo, 3D Echo, Cardiac Doppler and Color Doppler Indications:    R55 Syncope  History:        Patient has prior history of Echocardiogram examinations, most                 recent 08/27/2018. Arrythmias:Atrial Fibrillation,                 Signs/Symptoms:Chest Pain; Risk Factors:Hypertension and                 Dyslipidemia. COVID-19.  Sonographer:    Jonelle Sidle Dance Referring Phys: [place]  1. Left ventricular ejection fraction, by estimation, is 40 to 45%. The left ventricle has mildly decreased function. The left ventricle demonstrates global hypokinesis. There is moderate left ventricular hypertrophy. Left ventricular diastolic parameters are indeterminate.  2. Right ventricular systolic function is mildly reduced. The right ventricular size is moderately enlarged. The estimated right ventricular systolic pressure is 82.4 mmHg.  3. Left atrial size was severely dilated.  4. Right atrial size was severely dilated.  5. The mitral valve is grossly normal. Severe mitral valve regurgitation, suspected mechanism is  secondary MR from LV dysfunction and atrial MR. No evidence of mitral stenosis.  6. Tricuspid valve regurgitation is severe. Incomplete coaptation of tricuspid valve leaflets likely from annular dilation.  7. The aortic valve is abnormal. There is severe calcifcation of the aortic valve. Aortic valve regurgitation is mild. Aortic valve mean gradient measures 20.0 mmHg. Low flow low gradient aortic valve stenosis. Based on valve appearance, there is at least moderate-severe aortic valve stenosis.  8. Aortic dilatation noted. There is mild dilatation of the ascending aorta, measuring 40 mm.  9. Cannot exclude atrial level shunt by color flow Doppler. FINDINGS  Left Ventricle: Left ventricular ejection fraction, by estimation, is 40 to 45%. The left ventricle has mildly decreased function. The left ventricle demonstrates global hypokinesis. The left ventricular internal cavity size was normal in size. There is  moderate left ventricular hypertrophy. Left ventricular diastolic parameters are indeterminate. Right Ventricle: The right ventricular size is moderately enlarged. Right vetricular wall thickness was not well visualized. Right ventricular systolic function is mildly reduced. The tricuspid regurgitant velocity is 2.06 m/s, and with an assumed right atrial pressure of 15 mmHg, the estimated right ventricular systolic pressure is 23.5 mmHg. Left Atrium: Left atrial size was severely dilated. Right Atrium: Right atrial size was severely dilated. Pericardium: Trivial pericardial effusion is present. Mitral Valve: The mitral valve is grossly normal. Severe mitral valve regurgitation. No evidence of mitral valve stenosis. Tricuspid Valve: The tricuspid valve is grossly normal. Tricuspid valve regurgitation is severe. Aortic Valve: The aortic valve is abnormal. There is severe calcifcation of the aortic valve. Aortic valve regurgitation is mild. Aortic regurgitation PHT measures 288  msec. Aortic valve mean gradient  measures 20.0 mmHg. Aortic valve peak gradient measures 29.3 mmHg. Aortic valve area, by VTI measures 0.50 cm. Pulmonic Valve: The pulmonic valve was normal in structure. Pulmonic valve regurgitation is mild to moderate. Aorta: Aortic dilatation noted. There is mild dilatation of the ascending aorta, measuring 40 mm. IAS/Shunts: Cannot exclude atrial level shunt by color flow Doppler.  LEFT VENTRICLE PLAX 2D LVIDd:         4.90 cm LVIDs:         3.70 cm LV PW:         1.30 cm LV IVS:        1.30 cm LVOT diam:     1.80 cm LV SV:         26 LV SV Index:   15 LVOT Area:     2.54 cm  RIGHT VENTRICLE RV Basal diam:  3.80 cm RV Mid diam:    2.40 cm RV S prime:     9.14 cm/s TAPSE (M-mode): 1.4 cm LEFT ATRIUM              Index       RIGHT ATRIUM           Index LA diam:        5.20 cm  2.92 cm/m  RA Area:     31.90 cm LA Vol (A2C):   136.0 ml 76.43 ml/m RA Volume:   114.00 ml 64.07 ml/m LA Vol (A4C):   107.0 ml 60.13 ml/m LA Biplane Vol: 124.0 ml 69.69 ml/m  AORTIC VALVE AV Area (Vmax):    0.50 cm AV Area (Vmean):   0.50 cm AV Area (VTI):     0.50 cm AV Vmax:           270.75 cm/s AV Vmean:          197.500 cm/s AV VTI:            0.528 m AV Peak Grad:      29.3 mmHg AV Mean Grad:      20.0 mmHg LVOT Vmax:         53.33 cm/s LVOT Vmean:        38.500 cm/s LVOT VTI:          0.104 m LVOT/AV VTI ratio: 0.20 AI PHT:            288 msec  AORTA Ao Root diam: 3.60 cm Ao Asc diam:  4.00 cm MITRAL VALVE               TRICUSPID VALVE MV Area (PHT): 2.91 cm    TR Peak grad:   17.0 mmHg MV Decel Time: 261 msec    TR Vmax:        206.00 cm/s MV E velocity: 90.25 cm/s                            SHUNTS                            Systemic VTI:  0.10 m                            Systemic Diam: 1.80 cm Cherlynn Kaiser MD Electronically signed by Cherlynn Kaiser MD Signature Date/Time: 04/29/2020/12:04:34 PM    Final    DG HIP UNILAT WITH PELVIS 2-3  VIEWS LEFT  Result Date: 04/28/2020 CLINICAL DATA:  Lateral hip pain post  fall 1 day ago at home. No history of prior injuries or surgeries. Reported pain on the LEFT. EXAM: DG HIP (WITH OR WITHOUT PELVIS) 2-3V LEFT COMPARISON:  None. FINDINGS: No sign of fracture about the bony pelvis.  Enthesopathy. Mild irregularity at the lateral margin of the femoral head seen only on one view on the LEFT. No sign of displacement. LEFT hip is located. IMPRESSION: Mild irregularity at the lateral margin of the LEFT femoral head seen only on one view. This could represent a subtle nondisplaced subcapital fracture. Electronically Signed   By: Zetta Bills M.D.   On: 04/28/2020 22:05    Cardiac Studies  TTE 04/29/2020 1. Left ventricular ejection fraction, by estimation, is 40 to 45%. The  left ventricle has mildly decreased function. The left ventricle  demonstrates global hypokinesis. There is moderate left ventricular  hypertrophy. Left ventricular diastolic  parameters are indeterminate.  2. Right ventricular systolic function is mildly reduced. The right  ventricular size is moderately enlarged. The estimated right ventricular  systolic pressure is 79.0 mmHg.  3. Left atrial size was severely dilated.  4. Right atrial size was severely dilated.  5. The mitral valve is grossly normal. Severe mitral valve regurgitation,  suspected mechanism is secondary MR from LV dysfunction and atrial MR. No  evidence of mitral stenosis.  6. Tricuspid valve regurgitation is severe. Incomplete coaptation of  tricuspid valve leaflets likely from annular dilation.  7. The aortic valve is abnormal. There is severe calcifcation of the  aortic valve. Aortic valve regurgitation is mild. Aortic valve mean  gradient measures 20.0 mmHg. Low flow low gradient aortic valve stenosis.  Based on valve appearance, there is at  least moderate-severe aortic valve stenosis.  8. Aortic dilatation noted. There is mild dilatation of the ascending  aorta, measuring 40 mm.  9. Cannot exclude atrial  level shunt by color flow Doppler.   Patient Profile  [name] [name] is a 87 y.o. female with atrial fibrillation (not on anticoagulation due to frequent falls), aortic stenosis, congestive heart failure, dementia who was admitted on 04/29/2020 with fall.  Assessment & Plan   1.  Mechanical fall -Occurred while attempting to sit down.  Found to have newly reduced EF with low flow low gradient severe AS, severe MR and severe TR.  Her valvular heart disease does not appear to be the cause.  She is volume down.  There is no evidence of congestive heart failure. -Suspect this was just related to mechanical element.  2.  Systolic heart failure, EF 40 to 45%/moderate to severe aortic stenosis severe MR/severe TR -Advanced dementia.  Very inactive.  Not a candidate for aggressive medical care.  No orders as indicated. -Would recommend to continue her beta-blocker for rate control of her atrial fibrillation.  BPs are soft.  She will not benefit from aggressive guideline directed medical therapy. -No need for Lasix.  3.  Atrial fibrillation, permanent -No anticoagulation due to recurrent falls.  I agree there is no benefit in the setting of advanced dementia. -We will just continue metoprolol for rate control.  [facility] will sign off.   Medication Recommendations: As above Other recommendations (labs, testing, etc): None Follow up as an outpatient: She may keep her regular scheduled follow-up appoint with her outpatient cardiologist.  There is no need for expedited follow-up.  She has advanced dementia and is not a good candidate for aggressive cardiovascular  care.  Goals of care discussion will be recommended.  For questions or updates, please contact [place] Please consult [email or web address] for contact info under   Time Spent with Patient: I have spent a total of 25 minutes with patient reviewing hospital notes, telemetry, EKGs, labs and examining the patient as well as  establishing an assessment and plan that was discussed with the patient.  > 50% of time was spent in direct patient care.    Signed, Addison Naegeli. Audie Box, MD, [place]  04/30/2020 11:15 AM

## 2020-04-30 NOTE — Evaluation (Signed)
Occupational Therapy Evaluation Patient Details Name: [name] [name] MRN: [id number] DOB: 12-12-1928 Today's Date: 04/30/2020    History of Present Illness 87 yo with falls was brought to ED, has cognitive changes, dementia, EF 40-45%, CHF, has low K+, overactive bladder, HTN, PAF, elevated TSH.  PMHx:  HTN, depression, HLD, PAF, hypothyroidism,   Clinical Impression   Pt admitted due to fall. Pt ahs had two falls in the last 30 days but unknown why. Pt also per daughter" likes to lean head forward to the ground. " Pt lives in [place] and family comes every day. Pt was having Outpatient Physical and Speech Therapy prior to admission. Pt at this time requires cues for safety with supervision for transfers, min guard for ambulation with RW and standing ADLs due to unsteadiness. Pt currently with functional limitations due to the deficits listed below (see OT Problem List).Pt will benefit from skilled OT to increase their safety and independence with ADL and functional mobility for ADL to facilitate discharge to venue listed below.        Follow Up Recommendations  SNF;Supervision/Assistance - 24 hour    Equipment Recommendations       Recommendations for Other Services       Precautions / Restrictions Precautions Precautions: Fall Precaution Comments: pt has near incontinence Restrictions Weight Bearing Restrictions: No      Mobility Bed Mobility Overal bed mobility: Needs Assistance Bed Mobility: Supine to Sit;Sit to Supine     Supine to sit: Supervision Sit to supine: Supervision   General bed mobility comments:  (cues for hand placement)    Transfers Overall transfer level: Needs assistance Equipment used: Rolling walker (2 wheeled) Transfers: Sit to/from Stand Sit to Stand: Supervision              Balance Overall balance assessment: History of Falls;Needs assistance Sitting-balance support: Feet supported Sitting balance-Leahy Scale:  Good                                     ADL either performed or assessed with clinical judgement   ADL Overall ADL's : Needs assistance/impaired Eating/Feeding: Independent;Cueing for safety;Cueing for sequencing   Grooming: Wash/dry hands;Wash/dry face;Min guard;Cueing for safety;Cueing for sequencing (pt has a postior lean with unspurotive activity)   Upper Body Bathing: Set up;Sitting   Lower Body Bathing: Sit to/from stand;Moderate assistance   Upper Body Dressing : Set up;Sitting   Lower Body Dressing: Moderate assistance;Cueing for safety;Cueing for sequencing;Sit to/from stand   Toilet Transfer: Min guard;Cueing for safety;Cueing for sequencing   Toileting- Water quality scientist and Hygiene: Min guard;Cueing for safety;Cueing for sequencing;Sit to/from stand   Tub/ Banker: Moderate assistance;Cueing for safety;Cueing for sequencing   Functional mobility during ADLs: Min guard;Rolling walker       Vision         Perception     Praxis      Pertinent Vitals/Pain Pain Assessment: No/denies pain     Hand Dominance Right   Extremity/Trunk Assessment Upper Extremity Assessment Upper Extremity Assessment: Generalized weakness   Lower Extremity Assessment Lower Extremity Assessment: Defer to PT evaluation   Cervical / Trunk Assessment Cervical / Trunk Assessment: Kyphotic   Communication Communication Communication: No difficulties   Cognition Arousal/Alertness: Awake/alert Behavior During Therapy: WFL for tasks assessed/performed Overall Cognitive Status: Within Functional Limits for tasks assessed  General Comments: pt has had two falls in the last 30 days but cause is unknown   General Comments       Exercises     Shoulder Instructions      Home Living Family/patient expects to be discharged to:: Private residence     Type of Home: Independent living facility Home Access:  Level entry     Home Layout: One level     Bathroom Shower/Tub: Teacher, early years/pre: Standard Bathroom Accessibility: Yes How Accessible: Accessible via walker Home Equipment: Tub bench;Cane - single point;Walker - 2 wheels;Grab bars - tub/shower;Bedside commode;Walker - 4 wheels   Additional Comments: Has a life alert  Lives With: Alone;Other (Comment) (daughter or son visits every day)    Prior Functioning/Environment Level of Independence: Independent with assistive device(s)        Comments: ADLs and light IADLs. Daughter assists with IADLs and visits a lot. Drives very short distances. Goes to a lunch program for some meals        OT Problem List: Decreased strength;Decreased activity tolerance;Impaired balance (sitting and/or standing);Decreased safety awareness      OT Treatment/Interventions: Self-care/ADL training;Therapeutic exercise;DME and/or AE instruction;Therapeutic activities;Patient/family education;Balance training    OT Goals(Current goals can be found in the care plan section) Acute Rehab OT Goals Patient Stated Goal: to get stronger prior to going home OT Goal Formulation: With patient Time For Goal Achievement: 05/14/20 Potential to Achieve Goals: Good ADL Goals Pt Will Perform Upper Body Bathing: with set-up;sitting Pt Will Perform Lower Body Bathing: with supervision;sit to/from stand Pt Will Transfer to Toilet: with supervision;ambulating Pt Will Perform Tub/Shower Transfer: Shower transfer;with min assist;shower seat;grab bars  OT Frequency: Min 2X/week   Barriers to D/C:            Co-evaluation              AM-PAC OT "6 Clicks" Daily Activity     Outcome Measure Help from another person eating meals?: None Help from another person taking care of personal grooming?: A Little Help from another person toileting, which includes using toliet, bedpan, or urinal?: A Little Help from another person bathing (including  washing, rinsing, drying)?: A Little Help from another person to put on and taking off regular upper body clothing?: None Help from another person to put on and taking off regular lower body clothing?: A Little 6 Click Score: 20   End of Session Equipment Utilized During Treatment: Gait belt;Rolling walker  Activity Tolerance: Patient tolerated treatment well Patient left: in bed;with call bell/phone within reach;with bed alarm set;with family/visitor present (daughter present)  OT Visit Diagnosis: Unsteadiness on feet (R26.81);Repeated falls (R29.6);Muscle weakness (generalized) (M62.81)                Time: 1829-9371 OT Time Calculation (min): 59 min Charges:  OT General Charges $OT Visit: 1 Visit OT Evaluation $OT Eval Low Complexity: 1 Low OT Treatments $Self Care/Home Management : 38-52 mins  Joeseph Amor OTR/L  Acute Rehab Services  [phone] office number [phone] pager number   Joeseph Amor 04/30/2020, 10:20 AM

## 2020-04-30 NOTE — Progress Notes (Signed)
Initial Nutrition Assessment  DOCUMENTATION CODES:   Not applicable  INTERVENTION:  Ensure Enlive po BID, each supplement provides 350 kcal and 20 grams of protein (chocolate/strawberry)  Juven BID, each packet provides 95 calories, 2.5 grams of protein (collagen), and 9.8 grams of carbohydrate (3 grams sugar); also contains 7 grams of L-arginine and L-glutamine, 300 mg vitamin C, 15 mg vitamin E, 1.2 mcg vitamin B-12, 9.5 mg zinc, 200 mg calcium, and 1.5 g  Calcium Beta-hydroxy-Beta-methylbutyrate to support wound healing  MVI with minerals daily  Pt at risk for refeeding, recommend continuing to monitor magnesium, potassium, and phosphorus daily, MD to replete as needed  NUTRITION DIAGNOSIS:   Increased nutrient needs related to wound healing as evidenced by estimated needs.  GOAL:   Patient will meet greater than or equal to 90% of their needs   MONITOR:   Labs,I & O's,Supplement acceptance,PO intake,Weight trends,Skin  REASON FOR ASSESSMENT:   Consult Assessment of nutrition requirement/status  ASSESSMENT:   RD working remotely.  87 year old female admitted hypokalemia with generalized weakness presented with recent fall and found down by daughter. Past medical history of paroxysmal atrial fibrillation not on anticoagulation, aortic stenosis, HTN, HLD, RA, dementia, and depression.  Spoke with pt via phone this afternoon, reports feeling good today.  Recalls soup and sandwich for lunch, consumed 90% per flowsheet. Pt endorses good appetite, usually eats 3 meals/day. Recalls cereal, grits, eggs for breakfast, soups and sandwiches for lunch, says daughter normally brings her dinner.    Patient recalls usual weight around 170 lbs. Per chart, weights have trended down ~9 lbs (5.6%) in the last 2 months which is insignificant for time frame, however concerning given advanced age with history significant for dementia. Highly suspect degree of malnutrition, however unable to  identify at this time, will plan to complete exam at follow-up. She occasionally drinks Ensure at home, likes the chocolate and strawberry flavors and is agreeable to drinking during admission. Will also order Juven to support wound healing of stage II sacral pressure injury present on admission.   Medications reviewed and include: Namenda  IVF: NaCl @ 100 ml/hr  Labs: K 4.4 (WNL) >3.3, Mg 1.8 (WNL), P 4.2 (WNL)  NUTRITION - FOCUSED PHYSICAL EXAM:  Unable to complete at this time  Diet Order:   Diet Order            Diet Heart Room service appropriate? Yes; Fluid consistency: Thin  Diet effective now                 EDUCATION NEEDS:   Education needs have been addressed  Skin:  Skin Assessment: Skin Integrity Issues: Skin Integrity Issues:: Stage II Stage II: sacrum  Last BM:  3/11  Height:   Ht Readings from Last 1 Encounters:  04/28/20 5\' 4"  (1.626 m)    Weight:   Wt Readings from Last 1 Encounters:  04/28/20 72.6 kg    BMI:  Body mass index is 27.46 kg/m.  Estimated Nutritional Needs:   Kcal:  1600-1800  Protein:  85-100  Fluid:  1.6 L   Lajuan Lines, RD, LDN Clinical Nutrition After Hours/Weekend Pager # in [place]

## 2020-05-01 DIAGNOSIS — E876 Hypokalemia: Secondary | ICD-10-CM | POA: Diagnosis not present

## 2020-05-01 LAB — BASIC METABOLIC PANEL
Anion gap: 9 (ref 5–15)
BUN: 22 mg/dL (ref 8–23)
CO2: 23 mmol/L (ref 22–32)
Calcium: 8.6 mg/dL — ABNORMAL LOW (ref 8.9–10.3)
Chloride: 106 mmol/L (ref 98–111)
Creatinine, Ser: 2.11 mg/dL — ABNORMAL HIGH (ref 0.44–1.00)
GFR, Estimated: 22 mL/min — ABNORMAL LOW (ref >60)
Glucose, Bld: 109 mg/dL — ABNORMAL HIGH (ref 70–99)
Potassium: 4.8 mmol/L (ref 3.5–5.1)
Sodium: 138 mmol/L (ref 135–145)

## 2020-05-01 LAB — PHOSPHORUS: Phosphorus: 4.5 mg/dL (ref 2.5–4.6)

## 2020-05-01 LAB — MAGNESIUM: Magnesium: 2 mg/dL (ref 1.7–2.4)

## 2020-05-01 NOTE — Progress Notes (Addendum)
Family Medicine Teaching Service Daily Progress Note Intern Pager: [phone]  Patient name: [name] [name] Medical record number: [id number] Date of birth: 01/21/1929 Age: 87 y.o. Gender: female  Primary Care Provider: Merrilee Seashore, MD Consultants: Cardiology (s/o) Code Status: DNR  Pt Overview and Major Events to Date:  3/10: Admitted  3/11: Recommended for SNF 3/12: Cardiology s/o  Assessment and Plan: [name] [name] a 88 y.o.femalewho was admitted s/p fall. PMH is significant for paroxysmal A. fib not on anticoagulation, aortic stenosis, HTN, hyperlipidemia, RA, dementia, depression.  Fall  Cognitive decline  Dementia Patient resided at home prior to admission. PT/OT recommendation is SNF or 24h supervision. -PT/OT following -CSW to help with SNF placement -Fall precautions -OOB with assistance -Continue home donepezil and memantine  HFrEF EF 40-45%  Severe mitral and tricuspid regurgitation  Aortic stenosis Cardiology consulted and do not recommend aggressive guideline directed medical therapy given her age, inactivity and advanced dementia. Weight 81.1>82.4 kg, patient appears volume down.  I/O with 300cc PO and 300cc urine. -Continue metoprolol 12.5mg  BID -Strict I/O's -Daily weights  AKI: worsening Creatinine 2.11>2.40. GFR 20>19. Was started on gentle hydration with NS at 125 mL/hr yesterday. Has not had adequate urine output. Per RN, bladder scans with minimal urine (~20cc). Decreased PO. K+ increased to 5.4 this AM.  -500cc NS bolus -Repeat BMP in PM -Daily BMP -Avoid nephrotoxic agents -Kidney U/S -FeNa   A. Fib, permanent: chronic, stable -Continue metoprolol 12.5mg  BID  Overactive bladder: chronic, stable -Continue home Myrbetriq 25 mg daily -Holding home oxybutynin; recommend d/c on discharge due to BEERs criteria  Hypertension: chronic, stable BP's ranging 100-107/77-87. Most recent BP 97/72. -Continue home  metoprolol  Hyperlipidemia: chronic, stable -Continue home rosuvastatin 20 mg  Depression: chronic, stable -Continue home sertraline100 mg daily  Subclinical hypothyroidism: stable TSH 7.115, free T4 0.73. -No intervention at this time  Monitoring for Refeeding: stable  Mg 2.1 this AM. Phos 4.7. K+ 5.4. Only 300 mL recorded for PO yesterday.   FEN/GI: Regular diet (advanced from heart healthy given poor PO intake) PPx: Lovenox   Status is: Inpatient  Remains inpatient appropriate because:Unsafe d/c plan   Dispo: The patient is from: Home              Anticipated d/c is to: SNF              Patient currently is not medically stable to d/c.   Difficult to place patient No    Subjective:  [name] [name] has no complaints this AM. No pain. Has not had breakfast yet this morning but reports not feeling hungry. Her daughter is with her at bedside. RN also at bedside and notes that patient has not had much urine output. Daughter is tearful at bedside.    Objective: Temp:  [98.1 F (36.7 C)-98.3 F (36.8 C)] 98.3 F (36.8 C) (03/13 1500) Pulse Rate:  [72-81] 79 (03/13 1500) Resp:  [16-17] 16 (03/13 1500) BP: (98-107)/(67-87) 107/87 (03/13 1500) SpO2:  [95 %-99 %] 95 % (03/13 1500) Weight:  [72.1 kg] 72.1 kg (03/13 0448) Physical Exam: General: Awake, AxO x4. In no apparent distress, appears restless Cardiovascular: Irregularly irregular Respiratory: CTAB without wheezing/rhonchi/rales Extremities: No edema, 2+ radial and DP pulses b/l   Laboratory: Recent Labs  Lab 04/28/20 1604 04/30/20 0240  WBC 6.6 7.3  HGB 12.5 11.9*  HCT 38.7 38.4  PLT 209 180   Recent Labs  Lab 04/29/20 0125 04/29/20 0910 04/30/20 0240 05/01/20 0334  NA  139 139 137 138  K 2.8* 3.3* 4.2 4.8  CL 102 101 103 106  CO2 29 29 27 23   BUN 14 13 15 22   CREATININE 0.91 1.06* 1.53* 2.11*  CALCIUM 8.8* 8.9 8.7* 8.6*  PROT 5.9*  --   --   --   BILITOT 1.1  --   --   --   ALKPHOS 46  --    --   --   ALT 20  --   --   --   AST 37  --   --   --   GLUCOSE 162* 186* 113* 109*     Imaging/Diagnostic Tests: None new.   Sharion Settler, DO 05/01/2020, 6:42 PM PGY-1, [place] Intern pager: [phone], text pages welcome

## 2020-05-01 NOTE — Discharge Summary (Signed)
[facility] Discharge Summary  Patient name: [name] [name] Medical record number: [id number] Date of birth: Mar 03, 1928 Age: 87 y.o. Gender: female Date of Admission: 04/28/2020  Date of Discharge: 05/11/20 Admitting Physician: Blane Ohara McDiarmid, MD  Primary Care Provider: Merrilee Seashore, MD Consultants: Cardiology, Palliative   Indication for Hospitalization: Fall   Discharge Diagnoses/Problem List:  Fall  Dementia HFpEF, Severe mitral and tricuspid regurgitation, Aortic stenosis Hypokalemia Permanent Atrial Fibrillation  Disposition: SNF  Discharge Condition: Stable  Discharge Exam:   General: Alert, no acute distress, frail 87 yr old female  Cardio:  Well perfused Pulm: normal work of breathing Neuro: Cranial nerves grossly intact  Brief Hospital Course:  [name] [name] is a 87 y.o. female who presented with recent fall, found down by daughter . PMH is significant for paroxysmal A. fib not on anticoagulation, aortic stenosis, HTN, hyperlipidemia, RA, dementia, depression.  Fall  Dementia Patient found down after 1-2 hours with no reported head injury though patient couldn't describe the event. CT head was obtained with no acute abnormalities. Significant hematoma of the left lateral thigh present, x-ray imaging showed possible nondisplaced subcapital fracture of the femoral head and further evaluated with CT that showed no fracture. Syncope could not be ruled out and and a full workup was completed with echo, orthostatic vitals, and labs. Echo with significant results as described below. Ultimately, fall was deemed to be from mechanical cause and not related to cardiac origin. Patient noted to have progressive worsening of cognitive functioning with increasing frequency of falls in the last several months. Patient was living alone at time of admission. Was evaluated by PT/OT during admission who recommended SNF placement. Palliative  consult was placed and family decided on long term care facility with hospice. She transitioned to comfort care only 3/17.   HFpEF  Echo demonstrated EF of 40-45% with global hypokinesis of LV and moderate LVH. There was severe dilation of the left and right atria with severe mitral valve regurgitation and tricuspid regurgitation. Severe calcification of aortic valve with mild regurgitation. Heart failure was consulted and recommended to not pursue guideline directed medical therapy 2/2 her advanced age, inactivity and dementia. All cardiac medications discontinued.  Hypokalemia  Acute Kidney Injury Patient admitted with critical potassium level of 2.3 and was repleted appropriately. Upon discharge potassium was in normal range. Patient with worsening renal function throughout admission. Creatinine continued to increase. Was cautiously given fluids given her poor cardiac output. Etiology thought to be due to worsening heart failure and decreased PO. FENa obtained which confirmed pre-renal origin. Renal U/S without hydronephrosis or obstruction. Further work up and nephrology consult was deferred after shared decision making with daughter and decision to keep patient comfortable.   Permanent Afib Patient not on chronic anticoagulation, noted to have afib on physical exam telemetry at time of admission. Home medications of diltiazem and metoprolol were initially continued during admission. Once echo returned with decreased EF, diltiazem was discontinued. All cardiac medications discontinued on discharge.  Depression Continue Zoloft 100mg  daily.   All other chronic conditions were stable.    PCP recommendations: 1. Continue Zoloft 100mg  daily  2. All other medications stopped 3. Continue comfort care   Significant Procedures: None  Significant Labs and Imaging:  No results for input(s): WBC, HGB, HCT, PLT in the last 168 hours. No results for input(s): NA, K, CL, CO2, GLUCOSE, BUN,  CREATININE, CALCIUM, MG, PHOS, ALKPHOS, AST, ALT, ALBUMIN, PROTEIN in the last 168 hours.  Invalid  input(s): TBILI 3/10: Left Hip X-ray 2-3 View IMPRESSION: Mild irregularity at the lateral margin of the LEFT femoral head seen only on one view. This could represent a subtle nondisplaced subcapital fracture.  3/10 CT Head without Contrast IMPRESSION: 1. No acute intracranial abnormality. No skull fracture. 2. Stable degree of chronic small vessel ischemia.   3/10 CT Left Hip w/o contrast IMPRESSION: No acute fracture or dislocation of the left hip. Extensive lateral soft tissue swelling.   Results/Tests Pending at Time of Discharge: None  Discharge Medications:  Allergies as of 05/11/2020      Reactions   Codeine Nausea And Vomiting, Other (See Comments)   Dizziness      Medication List    STOP taking these medications   albuterol 108 (90 Base) MCG/ACT inhaler Commonly known as: VENTOLIN HFA   aspirin EC 81 MG tablet   calcium-vitamin D 500-200 MG-UNIT tablet Commonly known as: OSCAL WITH D   diltiazem 180 MG 24 hr capsule Commonly known as: TIAZAC   diltiazem 240 MG 24 hr capsule Commonly known as: CARDIZEM CD   donepezil 10 MG tablet Commonly known as: ARICEPT   memantine 10 MG tablet Commonly known as: Namenda   metoprolol tartrate 25 MG tablet Commonly known as: LOPRESSOR   mirabegron ER 25 MG Tb24 tablet Commonly known as: MYRBETRIQ   oxybutynin 10 MG 24 hr tablet Commonly known as: DITROPAN-XL   rosuvastatin 20 MG tablet Commonly known as: CRESTOR   tobramycin 0.3 % ophthalmic solution Commonly known as: TOBREX   VITAMIN D PO     TAKE these medications   bisacodyl 10 MG suppository Commonly known as: DULCOLAX Place 1 suppository (10 mg total) rectally daily as needed for moderate constipation.   glycopyrrolate 1 MG tablet Commonly known as: ROBINUL Take 1 tablet (1 mg total) by mouth every 4 (four) hours as needed (excessive  secretions).   glycopyrrolate 0.2 MG/ML injection Commonly known as: ROBINUL Inject 1 mL (0.2 mg total) into the skin every 4 (four) hours as needed (excessive secretions).   haloperidol 0.5 MG tablet Commonly known as: HALDOL Take 1 tablet (0.5 mg total) by mouth every 4 (four) hours as needed for agitation (or delirium).   haloperidol 2 MG/ML solution Commonly known as: HALDOL Place 0.3 mLs (0.6 mg total) under the tongue every 4 (four) hours as needed for agitation (or delirium).   LORazepam 1 MG tablet Commonly known as: ATIVAN Take 1 tablet (1 mg total) by mouth every 4 (four) hours as needed for anxiety.   LORazepam 2 MG/ML concentrated solution Commonly known as: ATIVAN Place 0.5 mLs (1 mg total) under the tongue every 4 (four) hours as needed for anxiety.   ondansetron 4 MG disintegrating tablet Commonly known as: ZOFRAN-ODT Take 1 tablet (4 mg total) by mouth every 6 (six) hours as needed for nausea.   pantoprazole 40 MG tablet Commonly known as: PROTONIX Take 1 tablet (40 mg total) by mouth daily.   polyethylene glycol 17 g packet Commonly known as: MIRALAX / GLYCOLAX Take 17 g by mouth 2 (two) times daily.   polyvinyl alcohol 1.4 % ophthalmic solution Commonly known as: LIQUIFILM TEARS Place 1 drop into both eyes 4 (four) times daily as needed for dry eyes.   sertraline 100 MG tablet Commonly known as: ZOLOFT Take 100 mg by mouth daily.       Discharge Instructions: Please refer to Patient Instructions section of EMR for full details.  Patient was counseled important signs and  symptoms that should prompt return to medical care, changes in medications, dietary instructions, activity restrictions, and follow up appointments.   Follow-Up Appointments:   Lattie Haw, MD 05/11/2020, 8:56 AM PGY-2, [place]

## 2020-05-01 NOTE — Progress Notes (Signed)
Family Medicine Teaching Service Daily Progress Note Intern Pager: [phone]  Patient name: [name] [name] Medical record number: [id number] Date of birth: 11-06-1928 Age: 87 y.o. Gender: female  Primary Care Provider: Merrilee Seashore, MD Consultants: Cardiology (s/o) Code Status: DNR  Pt Overview and Major Events to Date:  3/10: Admitted  3/11: Recommended for SNF 3/12: Cardiology s/o  Assessment and Plan: [name] [name] a 87 y.o.femalewho was admitted s/p fall. PMH is significant for paroxysmal A. fib not on anticoagulation, aortic stenosis, HTN, hyperlipidemia, RA, dementia, depression.  Fall  Cognitive decline  Dementia Patient resided at home prior to admission. PT/OT recommendation is SNF or 24h supervision. -PT/OT following -CSW to help with SNF placement -Fall precautions -OOB with assistance -Continue home donepezil and memantine -NS IVF @ maintenance  HFrEF EF 40-45%  Severe mitral and tricuspid regurgitation  Aortic stenosis Cardiology consulted and do not recommend aggressive guideline directed medical therapy given her age, inactivity and advanced dementia.  -Continue metoprolol 12.5mg  BID -Strict I/O's -Daily weights  AKI: worsening Creatinine 1.53>2.11. GFR 20. Likely in the setting of recent diarrhea/vomiting combined with poor PO intake.  -Increase NS to 172mL/hr -Daily BMP -Avoid nephrotoxic agents  A. Fib, permanent: chronic, stable Discussed with daughter regarding ASA use in patient. Risks of continued use due to high fall risk in patient. Daughter amenable to discontinuing this medication.  -Continue metoprolol 12.5mg  BID -D/c ASA 81 mg   Overactive bladder: chronic, stable -Continue home Myrbetriq 25 mg daily -Holding home oxybutynin; recommend d/c on discharge due to BEERs criteria  Hypertension: chronic, stable BP's ranging 98-105/67-78. Most recent BP 98/71. -Continue home metoprolol  Hyperlipidemia: chronic,  stable -Continue home rosuvastatin 20 mg  Depression: chronic, stable -Continue home sertraline100 mg daily  Subclinical hypothyroidism TSH 7.115, free T4 0.73. -No intervention at this time  Hypomagnesemia: resolved Mg 2 this AM.   FEN/GI: Heart healthy diet; increase NS rate to 169mL/hr PPx: Lovenox  Status is: Inpatient  Remains inpatient appropriate because:Unsafe d/c plan   Dispo: The patient is from: Home              Anticipated d/c is to: SNF              Patient currently is medically stable to d/c.   Difficult to place patient No   Subjective:  [name] [name] is accompanied by her daughter this morning.  Daughter reports that she is generally weak and her appetite has been decreasing. She is wondering what is happening with her kidneys. Daughter is amenable to SNF placement for patient.  Feels that she would benefit from more supervised care.  [name] [name] has no complaints this morning.  Objective: Temp:  [98.1 F (36.7 C)-98.2 F (36.8 C)] 98.2 F (36.8 C) (03/13 0410) Pulse Rate:  [72-81] 72 (03/13 0410) Resp:  [16-17] 17 (03/13 0410) BP: (98-105)/(67-78) 98/71 (03/13 0410) SpO2:  [93 %-99 %] 98 % (03/13 0410) Weight:  [72.1 kg] 72.1 kg (03/13 0448) Physical Exam: General: Awake, alert, oriented to person, place, time, and situation, in no distress Cardiovascular: Irregularly irregular, rate controlled Respiratory: CTA B, no wheezing/rhonchi/rales Abdomen: Soft, nontender in all quadrants, nondistended, no rebound or guarding Extremities: No edema, SCDs to lower extremities, 2+ radial and DP pulses bilaterally  Laboratory: Recent Labs  Lab 04/28/20 1604 04/30/20 0240  WBC 6.6 7.3  HGB 12.5 11.9*  HCT 38.7 38.4  PLT 209 180   Recent Labs  Lab 04/29/20 0125 04/29/20 0910 04/30/20 0240 05/01/20 0334  NA 139 139 137 138  K 2.8* 3.3* 4.2 4.8  CL 102 101 103 106  CO2 29 29 27 23   BUN 14 13 15 22   CREATININE 0.91 1.06* 1.53* 2.11*  CALCIUM 8.8*  8.9 8.7* 8.6*  PROT 5.9*  --   --   --   BILITOT 1.1  --   --   --   ALKPHOS 46  --   --   --   ALT 20  --   --   --   AST 37  --   --   --   GLUCOSE 162* 186* 113* 109*    Imaging/Diagnostic Tests: None new.   [name] Settler, DO 05/01/2020, 8:55 AM PGY-1, [place] Intern pager: [phone], text pages welcome

## 2020-05-02 ENCOUNTER — Inpatient Hospital Stay (HOSPITAL_COMMUNITY): Payer: PPO

## 2020-05-02 ENCOUNTER — Other Ambulatory Visit (HOSPITAL_COMMUNITY): Payer: PPO

## 2020-05-02 ENCOUNTER — Ambulatory Visit: Payer: PPO | Admitting: Podiatry

## 2020-05-02 ENCOUNTER — Ambulatory Visit: Payer: PPO

## 2020-05-02 DIAGNOSIS — N179 Acute kidney failure, unspecified: Secondary | ICD-10-CM | POA: Diagnosis not present

## 2020-05-02 DIAGNOSIS — E876 Hypokalemia: Secondary | ICD-10-CM | POA: Diagnosis not present

## 2020-05-02 DIAGNOSIS — Z515 Encounter for palliative care: Secondary | ICD-10-CM

## 2020-05-02 DIAGNOSIS — I77810 Thoracic aortic ectasia: Secondary | ICD-10-CM | POA: Diagnosis present

## 2020-05-02 DIAGNOSIS — R531 Weakness: Secondary | ICD-10-CM

## 2020-05-02 DIAGNOSIS — R34 Anuria and oliguria: Secondary | ICD-10-CM | POA: Diagnosis present

## 2020-05-02 DIAGNOSIS — Z7189 Other specified counseling: Secondary | ICD-10-CM

## 2020-05-02 LAB — BASIC METABOLIC PANEL
Anion gap: 12 (ref 5–15)
Anion gap: 11 (ref 5–15)
BUN: 29 mg/dL — ABNORMAL HIGH (ref 8–23)
BUN: 36 mg/dL — ABNORMAL HIGH (ref 8–23)
CO2: 20 mmol/L — ABNORMAL LOW (ref 22–32)
CO2: 20 mmol/L — ABNORMAL LOW (ref 22–32)
Calcium: 8.6 mg/dL — ABNORMAL LOW (ref 8.9–10.3)
Calcium: 8.5 mg/dL — ABNORMAL LOW (ref 8.9–10.3)
Chloride: 104 mmol/L (ref 98–111)
Chloride: 106 mmol/L (ref 98–111)
Creatinine, Ser: 2.4 mg/dL — ABNORMAL HIGH (ref 0.44–1.00)
Creatinine, Ser: 2.7 mg/dL — ABNORMAL HIGH (ref 0.44–1.00)
GFR, Estimated: 19 mL/min — ABNORMAL LOW (ref >60)
GFR, Estimated: 16 mL/min — ABNORMAL LOW (ref >60)
Glucose, Bld: 89 mg/dL (ref 70–99)
Glucose, Bld: 161 mg/dL — ABNORMAL HIGH (ref 70–99)
Potassium: 5.4 mmol/L — ABNORMAL HIGH (ref 3.5–5.1)
Potassium: 4.8 mmol/L (ref 3.5–5.1)
Sodium: 136 mmol/L (ref 135–145)
Sodium: 137 mmol/L (ref 135–145)

## 2020-05-02 LAB — URINALYSIS, ROUTINE W REFLEX MICROSCOPIC
Bilirubin Urine: NEGATIVE
Glucose, UA: NEGATIVE mg/dL
Ketones, ur: NEGATIVE mg/dL
Nitrite: NEGATIVE
Protein, ur: 100 mg/dL — AB
Specific Gravity, Urine: 1.023 (ref 1.005–1.030)
pH: 5 (ref 5.0–8.0)

## 2020-05-02 LAB — PHOSPHORUS: Phosphorus: 4.7 mg/dL — ABNORMAL HIGH (ref 2.5–4.6)

## 2020-05-02 LAB — SODIUM, URINE, RANDOM: Sodium, Ur: 12 mmol/L

## 2020-05-02 LAB — MAGNESIUM: Magnesium: 2.1 mg/dL (ref 1.7–2.4)

## 2020-05-02 LAB — CREATININE, URINE, RANDOM: Creatinine, Urine: 251.42 mg/dL

## 2020-05-02 MED ORDER — SODIUM CHLORIDE 0.9 % IV BOLUS
500.0000 mL | Freq: Once | INTRAVENOUS | Status: AC
  Administered 2020-05-02: 500 mL via INTRAVENOUS

## 2020-05-02 NOTE — Progress Notes (Signed)
Called pt's daughter Levander Campion to give her an update regarding [place] ordering further tests before consulting nephrology. Also explained that we have placed the palliative referral.   Lattie Haw MD PGY-2, [place]

## 2020-05-02 NOTE — Progress Notes (Signed)
Spoke with patient's daughter this morning regarding worsening kidney function. I said we may speak to nephrology on this admission. I also offered her the option of [place] placing a palliative care referral for [name] [name] given her decline in the hospital. She said she would be fine with this and her goal is for her mom to be as comfortable as possible. She was quite tearful this morning as she knows her mom has declined in health and she just wants what is best for her.   Lattie Haw MD PGY-2, [place]

## 2020-05-02 NOTE — Consult Note (Signed)
Consultation Note Date: 05/02/2020   Patient Name: [name] [name]  DOB: [birth year]  MRN: [id number]  Age / Sex: 87 y.o., female  PCP: Merrilee Seashore, MD Referring Physician: McDiarmid, Blane Ohara, MD  Reason for Consultation: Establishing goals of care and Psychosocial/spiritual support  HPI/Patient Profile: 87 y.o. female  with past medical history of dementia, HTN, HLD, depression, RA/OA, systolic heart failure, recurrent falls, aortic stenosis, afib not on anticoagulation, overactive bladder, and subclinical hypothyroidism admitted on 04/28/2020 with hypokalemia, generalized weakness, and fall.   Patient was admitted for syncope work-up and fall was determined to likely be mechanical. Patient's health has declined, with AKI/renal function worsening during hospitalization. Palliative medicine consulted to assist with determining goals of care.  Clinical Assessment and Goals of Care:  I have reviewed medical records including EPIC notes, labs and imaging, received report from RN/Alexis, assessed the patient and then met at the bedside along with patient's daughter [name] [name]) and son [name] [name] to discuss diagnosis, prognosis, GOC, [name] wishes, disposition and options.  I introduced Palliative Medicine as specialized medical care for people living with serious illness. It focuses on providing relief from the symptoms and stress of a serious illness. The goal is to improve quality of life for both the patient and the family.  We discussed a brief life review of the patient and then focused on their current illness. The natural disease trajectory and expectations at [name] were discussed.  I attempted to elicit values and goals of care important to the patient.    [name] [name] was residing at [facility] independent living and ambulated using her walker PTA. No driving. She was cooking small meals and taking her  medications on her own but required assistance with bathing and dressing. She has 4 children (3 sons and 1 daughter) and several grandchildren. Her husband died 32 years ago and her only sister died 74 month ago. 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 She enjoys visiting the food bank, playing cards at the senior center, and going to church.  Her daughter [name] [name]) visits frequently and reports patient had increasingly needed assistance with ADLs. [name]'s functional status has drastically declined since contracting COVID PNA in November 2021. [name] [name] notes patient's decreased nutritional intake, weight loss, frequent falls, grimacing with movements, worsening memory, and linguistic deficits. [name] [name] and [name] [name] both understand the terminal nature of patient's dementia. They acknowledge the recent development of signs that indicate end stage dementia. Discussed multifactorial causes of [name] [name]'s current illness including her worsening kidney function. Discussed very limited treatment options given her frailty, advanced age, and comorbidities. Teaching service supervisor Dr. Claiborne Billings then entered the room and kindly provided additional insight and answered family's questions.  [name] Campion shares that they are worried about decisions for disposition, realizing that [name] [name] and herself both work full time and neither of them are able to provide 24/7 care in their homes. She is uncertain of how Vandora would feel about another rehab attempt. She can tell her mother's pain increases when she  walks, although she won't admit it. [name] [name] and [name] [name] are emotional and tearful. An extensive conversation was had covering concepts specific to code status, artifical feeding and hydration, continued IV antibiotics and rehospitalization. They are willing to review and complete a MOST form; treatment options were made as below:  Cardiopulmonary Resuscitation: Do Not Attempt Resuscitation (DNR/No CPR)   Medical Interventions: Comfort Measures: Keep clean, warm, and dry. Use medication by any route, positioning, wound care, and other measures to relieve pain and suffering. Use oxygen, suction and manual treatment of airway obstruction as needed for comfort. Do not transfer to the [name] unless comfort needs cannot be met in current location.  Antibiotics: Determine use of limitation of antibiotics when infection occurs  IV Fluids: No IV fluids (provide other measures to ensure comfort)  Feeding Tube: No feeding tube    The difference between aggressive medical intervention and comfort care was considered in light of the patient's goals of care.   Hospice and Palliative Care services outpatient were explained and offered.  Discussed the importance of continued conversation with family and the medical providers regarding overall plan of care and treatment options, ensuring decisions are within the context of the patient's values and GOCs.    Questions and concerns were addressed. The family was encouraged to call with questions or concerns.  PMT will continue to support holistically.    NEXT OF KIN are majority of patient's adult children. Daughter [name] [name]) is the primary contact. No HCPOA/Living Will documentation on file.    SUMMARY OF RECOMMENDATIONS   -Continue to provide emotional and psychosocial support -MOST form complete. Original placed in pt chart. Copy provided to family. Will scan into EMR/Vynca  -Continue non-invasive diagnostics and current treatment for stabilization -No escalation of treatment -Family has decided for d/c to SNF with hospice services -Unrestricted visitation status order placed in light of transition to comfort focused care -Hard Choices booklet left for review -Discussed with RN Ubaldo Glassing, Milus Banister, CM Heather, Dr. Posey Pronto, Dr. Precious Haws, Dr. Oleh Genin  Code Status/Advance Care Planning:  DNR   Patient does not have capacity and is unable to  complete advance directive documentation  Palliative Prophylaxis:   Aspiration, Bowel Regimen, Delirium Protocol, Frequent Pain Assessment and Oral Care  Additional Recommendations (Limitations, Scope, Preferences):  Avoid Hospitalization, No Artificial Feeding and No Escalation of Treatment  Psycho-social/Spiritual:   Desire for further Chaplaincy support: did not assess  Additional Recommendations: Caregiving  Support/Resources and Education on Hospice  Prognosis:   < 6 months ; poor long-term prognosis with severe frailty, malnutrition, end stage dementia, and multiple additional comorbidities  Discharge Planning: [place] with Hospice      Primary Diagnoses: Present on Admission: . Fall . Atrial flutter ([place]) . Traumatic Soft tissue injury of hip, left, initial encounter . Dementia ([place]) . Rheumatoid arthritis ([place]) . Hypokalemia with generalized weakness . Hypomagnesemia . Protein calorie malnutrition ([place]) . Essential hypertension . Elevated CK . Dehydration . Hypophosphatemia . Sepsis, Rule Out . Aortic root dilatation ([facility])   I have reviewed the medical record, interviewed the patient and family, and examined the patient. The following aspects are pertinent.  Past Medical History:  Diagnosis Date  . Aortic stenosis   . Atrial fibrillation ([place])   . Carotid artery bruit 07/22/2012  . Chest pain    myoview 04/24/07-mild-mod perfusion defect with mild-mod superimposed ischemiain the mid inferior, apicla inferior, basal inferolateral and mid inferolateral regions  . Community acquired pneumonia 01/13/2013  . Dementia ([place])  01/27/2020  . Edema   . Gallstone   . Generalized osteoarthritis   . HTN (hypertension)   . Hyperlipidemia   . Hypochloremia 01/13/2013  . Hyponatremia 01/13/2013  . IFG (impaired fasting glucose)   . Mild neurocognitive disorder 02/26/2019  . Nonspecific abnormal electrocardiogram (ECG) (EKG)   . Normocytic anemia  08/26/2018  . Porokeratosis 09/02/2018  . Premature atrial contractions 07/22/2012  . Renal insufficiency 08/26/2018  . Rhabdomyolysis 08/26/2018  . Rheumatoid arthritis ([place]) 01/13/2013  . Vertigo    as late effect of cerebrovascular disease   Social History   Socioeconomic History  . Marital status: Divorced    Spouse name: Not on file  . Number of children: 4  . Years of education: 80  . Highest education level: Some college, no degree  Occupational History  . Not on file  Tobacco Use  . Smoking status: Never Smoker  . Smokeless tobacco: Never Used  Vaping Use  . Vaping Use: Never used  Substance and Sexual Activity  . Alcohol use: No  . Drug use: No  . Sexual activity: Never  Other Topics Concern  . Not on file  Social History Narrative   Lives at home alone   Right handed   Caffeine: sometimes   Social Determinants of Health   Financial Resource Strain: Not on file  Food Insecurity: Not on file  Transportation Needs: Not on file  Physical Activity: Not on file  Stress: Not on file  Social Connections: Not on file   Family History  Problem Relation Age of Onset  . Diabetes Father   . Seizures Brother   . Alzheimer's disease Sister   . Cancer Sister   . Breast cancer Sister   . Cancer Sister   . Seizures Daughter   . Seizures Son   . Seizures Granddaughter   . Ovarian cancer Granddaughter   . Lung cancer Nephew   . Breast cancer Niece   . Breast cancer Niece   . Brain cancer Niece    Scheduled Meds: . donepezil  10 mg Oral QHS  . enoxaparin (LOVENOX) injection  30 mg Subcutaneous Daily  . feeding supplement  237 mL Oral BID BM  . memantine  10 mg Oral BID  . metoprolol tartrate  12.5 mg Oral BID  . mirabegron ER  25 mg Oral Daily  . multivitamin with minerals  1 tablet Oral Daily  . nutrition supplement (JUVEN)  1 packet Oral BID BM  . rosuvastatin  20 mg Oral Daily  . sertraline  100 mg Oral Daily   Continuous Infusions: PRN Meds:.acetaminophen  **OR** acetaminophen Medications Prior to Admission:  Prior to Admission medications   Medication Sig Start Date End Date Taking? Authorizing Provider  aspirin EC 81 MG tablet Take 81 mg by mouth daily.   Yes [provider]  calcium-vitamin D (OSCAL WITH D) 500-200 MG-UNIT tablet Take 1 tablet by mouth daily.   Yes [provider]  diltiazem (TIAZAC) 180 MG 24 hr capsule Take 180 mg by mouth daily.   Yes [provider]  donepezil (ARICEPT) 10 MG tablet Take 10 mg by mouth at bedtime. 12/04/17  Yes [provider]  memantine (NAMENDA) 10 MG tablet Take 1 tablet (10 mg total) by mouth 2 (two) times daily. 10/28/19  Yes Lomax, Amy, NP  metoprolol tartrate (LOPRESSOR) 25 MG tablet Take 12.5 mg by mouth 2 (two) times daily.   Yes [provider]  mirabegron ER (MYRBETRIQ) 25 MG TB24  tablet Take 25 mg by mouth daily.   Yes [provider]  oxybutynin (DITROPAN-XL) 10 MG 24 hr tablet Take 10 mg by mouth daily. 01/19/19  Yes [provider]  rosuvastatin (CRESTOR) 20 MG tablet Take 20 mg by mouth daily.   Yes [provider]  sertraline (ZOLOFT) 100 MG tablet Take 100 mg by mouth daily.  12/04/17  Yes [provider]  tobramycin (TOBREX) 0.3 % ophthalmic solution Place 1 drop into both eyes 3 (three) times daily.   Yes [provider]  VITAMIN D PO Take 1 capsule by mouth daily.   Yes [provider]  albuterol (VENTOLIN HFA) 108 (90 Base) MCG/ACT inhaler Inhale 2 puffs into the lungs every 6 (six) hours as needed for wheezing or shortness of breath. Patient not taking: No sig reported 12/29/19   Elgergawy, Silver Huguenin, MD  diltiazem (CARDIZEM CD) 240 MG 24 hr capsule Take 1 capsule (240 mg total) by mouth daily. Patient not taking: No sig reported 12/30/19   Elgergawy, Silver Huguenin, MD  pantoprazole (PROTONIX) 40 MG tablet Take 1 tablet (40 mg total) by mouth daily. Patient not taking: Reported on 04/28/2020  12/30/19   Elgergawy, Silver Huguenin, MD   Allergies  Allergen Reactions  . Codeine Nausea And Vomiting and Other (See Comments)    Dizziness   Review of Systems  Constitutional: Positive for appetite change.  All other systems reviewed and are negative.   Physical Exam Vitals and nursing note reviewed.  Constitutional:      Appearance: She is ill-appearing.  HENT:     Head: Normocephalic and atraumatic.  Cardiovascular:     Rate and Rhythm: Normal rate and regular rhythm.  Pulmonary:     Effort: Pulmonary effort is normal. No respiratory distress.  Skin:    General: Skin is warm and dry.  Neurological:     Mental Status: Mental status is at baseline.     Motor: Weakness present.     Vital Signs: BP 106/88 (BP Location: Left Arm)   Pulse 88   Temp 98.6 F (37 C)   Resp 16   Ht _0  (1.626 m)   Wt 82.4 kg   SpO2 100%   BMI 31.18 kg/m  Pain Scale: 0-10   Pain Score: 0-No pain   SpO2: SpO2: 100 % O2 Device:SpO2: 100 % O2 Flow Rate: .   IO: Intake/output summary:   Intake/Output Summary (Last 24 hours) at 05/02/2020 1417 Last data filed at 05/02/2020 0900 Gross per 24 hour  Intake 120 ml  Output 150 ml  Net -30 ml    LBM: Last BM Date: 05/02/20 Baseline Weight: Weight: 72.6 kg Most recent weight: Weight: 82.4 kg     Palliative Assessment/Data: 40%    Time In: 1:30pm Time Out: 2:45pm Time Total: 75 minutes  Greater than 50% of this time was spent counseling and coordinating care related to the above assessment and plan.  Signed by: Norberta Keens, PA-C Palliative Medicine Team Team phone # [phone]  Thank you for allowing the Palliative Medicine Team to assist in the care of this patient. Please utilize secure chat with additional questions, if there is no response within 30 minutes please call the above phone number.  Palliative Medicine Team providers are available by phone from 7am to 7pm daily and can be reached through the team cell  phone.  Should this patient require assistance outside of these hours, please call the patient's attending physician.

## 2020-05-02 NOTE — Progress Notes (Signed)
Physical Therapy Treatment Patient Details Name: [name] [name] MRN: [id number] DOB: [birth year] Today's Date: 05/02/2020    History of Present Illness 87 yo with falls was brought to ED, has cognitive changes, dementia, EF 40-45%, CHF, has low K+, overactive bladder, HTN, PAF, elevated TSH.  PMHx:  HTN, depression, HLD, PAF, hypothyroidism,    PT Comments    Pt was seen for mobility training after receiving assistance to get OOB.  Her control of walker is hindered by general weakness and is still therefore recommended to go to SNF for further strengthening.  Her tolerance for gait is limited by PLOF where she was only walking short trips.  Follow acutely for progression to as much standing balance work and increasing endurance as tolerated.     Follow Up Recommendations  SNF     Equipment Recommendations  None recommended by PT    Recommendations for Other Services       Precautions / Restrictions Precautions Precautions: Fall Precaution Comments: pt has near incontinence Restrictions Weight Bearing Restrictions: No    Mobility  Bed Mobility Overal bed mobility: Needs Assistance Bed Mobility: Supine to Sit;Sit to Supine     Supine to sit: Min guard Sit to supine: Min assist   General bed mobility comments: min assist for lifting legs    Transfers Overall transfer level: Needs assistance Equipment used: Rolling walker (2 wheeled) Transfers: Sit to/from Stand Sit to Stand: Min guard         General transfer comment: min guard to steady  Ambulation/Gait Ambulation/Gait assistance: Min guard Gait Distance (Feet): 18 Feet Assistive device: Rolling walker (2 wheeled);1 person hand held assist Gait Pattern/deviations: Step-through pattern;Shuffle;Wide base of support   Gait velocity interpretation: <1.31 ft/sec, indicative of household ambulator General Gait Details: help to turn and maneuver walker   Stairs             Wheelchair Mobility     Modified Rankin (Stroke Patients Only)       Balance Overall balance assessment: History of Falls;Needs assistance Sitting-balance support: Feet supported Sitting balance-Leahy Scale: Good     Standing balance support: Bilateral upper extremity supported;During functional activity Standing balance-Leahy Scale: Poor                              Cognition Arousal/Alertness: Awake/alert Behavior During Therapy: WFL for tasks assessed/performed Overall Cognitive Status: Within Functional Limits for tasks assessed                                        Exercises      General Comments General comments (skin integrity, edema, etc.): pt is up to side of bed with help and walked with steadying help, and then assisted to reposition on the bed      Pertinent Vitals/Pain Pain Assessment: No/denies pain    Home Living                      Prior Function            PT Goals (current goals can now be found in the care plan section) Acute Rehab PT Goals Patient Stated Goal: to get stronger prior to going home Progress towards PT goals: Progressing toward goals    Frequency    Min 3X/week      PT Plan Current plan  remains appropriate    Co-evaluation              AM-PAC PT "6 Clicks" Mobility   Outcome Measure  Help needed turning from your back to your side while in a flat bed without using bedrails?: A Little Help needed moving from lying on your back to sitting on the side of a flat bed without using bedrails?: A Little Help needed moving to and from a bed to a chair (including a wheelchair)?: A Little Help needed standing up from a chair using your arms (e.g., wheelchair or bedside chair)?: A Little Help needed to walk in hospital room?: A Little Help needed climbing 3-5 steps with a railing? : A Lot 6 Click Score: 17    End of Session Equipment Utilized During Treatment: Gait belt Activity Tolerance: Patient  tolerated treatment well Patient left: in bed;with call bell/phone within reach Nurse Communication: Mobility status PT Visit Diagnosis: Unsteadiness on feet (R26.81);Muscle weakness (generalized) (M62.81)     Time: 1833-5825 PT Time Calculation (min) (ACUTE ONLY): 24 min  Charges:  $Gait Training: 8-22 mins $Therapeutic Activity: 8-22 mins                Ramond Dial 05/02/2020, 9:11 PM Mee Hives, PT MS Acute Rehab Dept. Number: [place] [facility] [place]

## 2020-05-02 NOTE — TOC Initial Note (Signed)
Transition of Care [facility]) - Initial/Assessment Note    Patient Details  Name: [name] [name] MRN: [id number] Date of Birth: 12/21/1928  Transition of Care [facility]) CM/SW Contact:    Emeterio Reeve, [place] Phone Number: 05/02/2020, 11:59 AM  Clinical Narrative:                  CSW spoke to pts daughter Baltazar Najjar via phone. Sunday Spillers stated that PTA pt was living home alone. Sunday Spillers states pt uses a walker for mobility and requires help with most ADL's. Pt is covid vaccinated and has not had booster.   CSW reviewed pt/ot reccs of SNF. Sunday Spillers stated she is open to [facility] but would like to see what palliative says first. Sunday Spillers stated she sopke to MD who said pts kidney function is poor and hospice/comfort care should be discussed. Sunday Spillers states if pt does go to SNF that she has been to [facility] in the past for would prefer her to go to [facility], [facility] or [facility].   TOC will follow.   Expected Discharge Plan: Skilled Nursing Facility Barriers to Discharge: Continued Medical Work up   Patient Goals and CMS Choice Patient states their goals for this hospitalization and ongoing recovery are:: Daughter states, "Im not sure yet, I want to talk to pallaitive" CMS Medicare.gov Compare Post Acute Care list provided to:: Patient Choice offered to / list presented to : Adult Children  Expected Discharge Plan and Services Expected Discharge Plan: [place] arrangements for the past 2 months: Single Family Home                                      Prior Living Arrangements/Services Living arrangements for the past 2 months: Single Family Home Lives with:: Self Patient language and need for interpreter reviewed:: Yes Do you feel safe going back to the place where you live?: Yes      Need for Family Participation in Patient Care: Yes (Comment) Care giver support system in place?: Yes (comment) Current home services: DME Criminal Activity/Legal  Involvement Pertinent to Current Situation/Hospitalization: No - Comment as needed  Activities of Daily Living      Permission Sought/Granted Permission sought to share information with : Family Chief Financial Officer Permission granted to share information with : Yes, Verbal Permission Granted     Permission granted to share info w AGENCY: SNF  Permission granted to share info w Relationship: Daughter Sunday Spillers     Emotional Assessment Appearance:: Appears stated age Attitude/Demeanor/Rapport: Engaged Affect (typically observed): Appropriate Orientation: : Oriented to Self,Oriented to Place,Oriented to  Time,Oriented to Situation Alcohol / Substance Use: Not Applicable Psych Involvement: No (comment)  Admission diagnosis:  Fall [W19.XXXA] Patient Active Problem List   Diagnosis Date Noted  . Aortic root dilatation ([place]) 05/02/2020  . Atrial flutter ([place]) 04/29/2020  . Traumatic Soft tissue injury of hip, left, initial encounter 04/29/2020  . Hypomagnesemia 04/29/2020  . Protein calorie malnutrition ([place]) 04/29/2020  . Frailty syndrome in geriatric patient 04/29/2020  . TSH elevation 04/29/2020  . Dehydration 04/29/2020  . Syncope, possible 04/29/2020  . Hypophosphatemia 04/29/2020  . Fall 04/28/2020  . Dementia ([place]) 01/27/2020  . Pneumonia due to COVID-19 virus 12/23/2019  . Acute hypoxemic respiratory failure ([place]) 12/23/2019  . Hypokalemia with generalized weakness 12/23/2019  . Transaminitis 12/23/2019  . Slow heart rate 03/13/2019  .  Mild neurocognitive disorder 02/26/2019  . Clavi 01/20/2019  . Porokeratosis 09/02/2018  . Corns and callosities 09/02/2018  . Elevated CK   . Pressure injury of skin 08/27/2018  . PAF (paroxysmal atrial fibrillation) ([place]) 08/27/2018  . Hypernatremia   . Rhabdomyolysis 08/26/2018  . Normocytic anemia 08/26/2018  . Renal insufficiency 08/26/2018  . Community acquired pneumonia 01/13/2013  . Sepsis, Rule Out  01/13/2013  . Hyponatremia 01/13/2013  . Hypochloremia 01/13/2013  . Rheumatoid arthritis ([place]) 01/13/2013  . Premature atrial contractions 07/22/2012  . Hyperlipidemia 07/22/2012  . Carotid artery bruit 07/22/2012  . Moderate aortic stenosis 07/22/2012  . Chest pain   . Essential hypertension    PCP:  Merrilee Seashore, MD Pharmacy:   [facility]), [place] - [address] [place] [address] Phone: [phone] Fax: [phone]     Social Determinants of Health (SDOH) Interventions    Readmission Risk Interventions No flowsheet data found.  Emeterio Reeve, Latanya Presser, [place] Social Worker [phone]

## 2020-05-02 NOTE — NC FL2 (Signed)
[place] LEVEL OF CARE SCREENING TOOL     IDENTIFICATION  Patient Name: [name] [name] Birthdate: [birth year] Sex: female Admission Date (Current Location): 04/28/2020  [facility] and [place] Number:  Herbalist and Address:  [facility], [place] [address] [place], [place] [address]      Provider Number: [id number]  Attending Physician Name and Address:  McDiarmid, Blane Ohara, MD  Relative Name and Phone Number:       Current Level of Care: Hospital Recommended Level of Care: [place] Prior Approval Number:    Date Approved/Denied:   PASRR Number: [id number]  Discharge Plan: Home    Current Diagnoses: Patient Active Problem List   Diagnosis Date Noted  . Aortic root dilatation ([place]) 05/02/2020  . Atrial flutter ([place]) 04/29/2020  . Traumatic Soft tissue injury of hip, left, initial encounter 04/29/2020  . Hypomagnesemia 04/29/2020  . Protein calorie malnutrition ([place]) 04/29/2020  . Frailty syndrome in geriatric patient 04/29/2020  . TSH elevation 04/29/2020  . Dehydration 04/29/2020  . Syncope, possible 04/29/2020  . Hypophosphatemia 04/29/2020  . Fall 04/28/2020  . Dementia ([place]) 01/27/2020  . Pneumonia due to COVID-19 virus 12/23/2019  . Acute hypoxemic respiratory failure ([place]) 12/23/2019  . Hypokalemia with generalized weakness 12/23/2019  . Transaminitis 12/23/2019  . Slow heart rate 03/13/2019  . Mild neurocognitive disorder 02/26/2019  . Clavi 01/20/2019  . Porokeratosis 09/02/2018  . Corns and callosities 09/02/2018  . Elevated CK   . Pressure injury of skin 08/27/2018  . PAF (paroxysmal atrial fibrillation) ([place]) 08/27/2018  . Hypernatremia   . Rhabdomyolysis 08/26/2018  . Normocytic anemia 08/26/2018  . Renal insufficiency 08/26/2018  . Community acquired pneumonia 01/13/2013  . Sepsis, Rule Out 01/13/2013  . Hyponatremia 01/13/2013  . Hypochloremia 01/13/2013  . Rheumatoid arthritis  ([place]) 01/13/2013  . Premature atrial contractions 07/22/2012  . Hyperlipidemia 07/22/2012  . Carotid artery bruit 07/22/2012  . Moderate aortic stenosis 07/22/2012  . Chest pain   . Essential hypertension     Orientation RESPIRATION BLADDER Height & Weight     Place,Situation,Time,Self  Normal Continent Weight: 181 lb 10.5 oz (82.4 kg) Height:  5\' 4"  (162.6 cm)  BEHAVIORAL SYMPTOMS/MOOD NEUROLOGICAL BOWEL NUTRITION STATUS      Continent Diet (See discharge summary)  AMBULATORY STATUS COMMUNICATION OF NEEDS Skin   Limited Assist Verbally Normal                       Personal Care Assistance Level of Assistance  Bathing,Feeding,Dressing Bathing Assistance: Limited assistance Feeding assistance: Independent Dressing Assistance: Limited assistance     Functional Limitations Info  Sight,Hearing,Speech Sight Info: Adequate Hearing Info: Adequate Speech Info: Adequate    SPECIAL CARE FACTORS FREQUENCY  OT (By licensed OT),PT (By licensed PT)     PT Frequency: 5x a week OT Frequency: 5x a week            Contractures Contractures Info: Not present    Additional Factors Info  Code Status,Allergies Code Status Info: DNR Allergies Info: Codeine           Current Medications (05/02/2020):  This is the current hospital active medication list Current Facility-Administered Medications  Medication Dose Route Frequency Provider Last Rate Last Admin  . acetaminophen (TYLENOL) tablet 650 mg  650 mg Oral Q6H PRN Lilland, Alana, DO       Or  . acetaminophen (TYLENOL) suppository 650 mg  650 mg Rectal Q6H  PRN Lilland, Alana, DO      . donepezil (ARICEPT) tablet 10 mg  10 mg Oral QHS McDiarmid, Blane Ohara, MD   10 mg at 05/01/20 2044  . enoxaparin (LOVENOX) injection 30 mg  30 mg Subcutaneous Daily Wilson Singer I, RPH   30 mg at 05/02/20 0825  . feeding supplement (ENSURE ENLIVE / ENSURE PLUS) liquid 237 mL  237 mL Oral BID BM McDiarmid, Blane Ohara, MD   237 mL at 05/02/20  1440  . memantine (NAMENDA) tablet 10 mg  10 mg Oral BID McDiarmid, Blane Ohara, MD   10 mg at 05/02/20 0825  . metoprolol tartrate (LOPRESSOR) tablet 12.5 mg  12.5 mg Oral BID Gladys Damme, MD   12.5 mg at 05/02/20 0837  . mirabegron ER (MYRBETRIQ) tablet 25 mg  25 mg Oral Daily Lilland, Alana, DO   25 mg at 05/02/20 0825  . multivitamin with minerals tablet 1 tablet  1 tablet Oral Daily McDiarmid, Blane Ohara, MD   1 tablet at 05/02/20 0825  . nutrition supplement (JUVEN) (JUVEN) powder packet 1 packet  1 packet Oral BID BM McDiarmid, Blane Ohara, MD   1 packet at 05/02/20 1441  . rosuvastatin (CRESTOR) tablet 20 mg  20 mg Oral Daily Lattie Haw, MD   20 mg at 05/02/20 0825  . sertraline (ZOLOFT) tablet 100 mg  100 mg Oral Daily McDiarmid, Blane Ohara, MD   100 mg at 05/02/20 6808     Discharge Medications: Please see discharge summary for a list of discharge medications.  Relevant Imaging Results:  Relevant Lab Results:   Additional Information SSN [id number]  Emeterio Reeve, [place]

## 2020-05-02 NOTE — Care Management Important Message (Signed)
Important Message  Patient Details  Name: [name] [name] MRN: [id number] Date of Birth: [birth year]   Medicare Important Message Given:  Yes     Joseph Johns 05/02/2020, 4:00 PM

## 2020-05-02 NOTE — Progress Notes (Addendum)
Family Medicine Teaching Service Daily Progress Note Intern Pager: [phone]  Patient name: [name] [name] Medical record number: [id number] Date of birth: [birth year] Age: 87 y.o. Gender: female  Primary Care Provider: Georgianne Fick, MD Consultants: Cardiology (s/o) Code Status: DNR  Pt Overview and Major Events to Date:  3/10: Admitted  3/11: Recommended for SNF 3/12: Cardiology s/o 3/14: Palliative consult 3/15: Medically clear to SNF with hospice  Assessment and Plan: [name] [name] a 87 y.o.femalewho was admitted s/p fall and now found to have decline in kidney function. PMH is significant for paroxysmal A. fib not on anticoagulation, aortic stenosis, HTN, hyperlipidemia, RA, dementia, depression. Patient is medically clear to discharge to SNF with hospice.  AKI: worsening Creatinine2.4>2.81. GFR 19>16. Given a 500cc bolus yesterday. FENa indicates pre-renal cause, likely due to hypovolemia and heart failure. Poor urine output, only 200 cc yesterday (0.1cc/kg/hr). Renal U/S without hydronephrosis. UA with trace leukocytes, 100 protein, small hgb, many bacteria and hyaline casts present. Further workup deferred after discussion with daughter who wishes to just keep patient comfortable.  -Avoid nephrotoxic agents  Fall  Cognitive decline  Dementia Patient resided at home prior to admission.PT/OTrecommendation is SNF or 24h supervision. Palliative consulted yesterday given overall decline and family decided on SNF with hospice. Daughter has decided on [facility]. Insurance authorization pending.  -PT/OT following -Awaiting insurance authorization -Fall precautions -OOB with assistance -Continue home donepezil and memantine -F/u COVID test  HFrEF EF40-45%  Severe mitral and tricuspid regurgitation  Aortic stenosis Cardiology consulted and do not recommend aggressive guideline directed medical therapy given her age, inactivity and advanced  dementia.Weight 82.4> 84.9 kg, patient appears volume down.  I/O +20.  -Continue metoprolol 12.5mg  BID -Strict I/O's -Daily weights  Incidental U/S finding concerning for cirrhosis Renal U/S with incidental finding of lobulated contour of liver suggestive of cirrhosis. Also with slight ascites in the upper quadrants.  -No intervention at this time  A. Fib, permanent: chronic, stable -Continue metoprolol 12.5mg  BID  Overactive bladder: chronic, stable -Continue home Myrbetriq 25 mg daily -Holding home oxybutynin; recommend d/c on discharge due to BEERs criteria  Hypertension: chronic, stable BP's ranging 97-131/72-88. Most recent BP 131/88. -Continue home metoprolol  Hyperlipidemia: chronic, stable -Continue home rosuvastatin 20 mg  Depression: chronic, stable -Continue home sertraline100 mg daily  Subclinical hypothyroidism: stable TSH 7.115, free T4 0.73. -No intervention at this time   FEN/GI: Regular diet (advanced from heart healthy given poor PO intake) PPx: Lovenox    Status is: Inpatient  Remains inpatient appropriate because:Unsafe d/c plan and Inpatient level of care appropriate due to severity of illness   Dispo: The patient is from: Home              Anticipated d/c is to: SNF              Patient currently is medically stable to d/c.   Difficult to place patient No    Subjective:  Daughter is at bedside today. She states that patient did not eat much yesterday. Only urinated twice yesterday while she was visiting (about 12 hour visit). She did have one episode of vomiting yesterday afternoon and daughter suspects that this is the reason she is hesitant about eating more. Patient denies any pain or complaints. Daughter at bedside is amenable to chaplain consult today for more support.   Objective: Temp:  [97.7 F (36.5 C)-98.6 F (37 C)] 98.6 F (37 C) (03/14 1346) Pulse Rate:  [80-99] 88 (03/14 1346) Resp:  [  16] 16 (03/14 1346) BP:  (97-106)/(72-88) 106/88 (03/14 1346) SpO2:  [92 %-100 %] 100 % (03/14 1346) Weight:  [82.4 kg] 82.4 kg (03/14 0433) Physical Exam: General: Awake, AxO x4 Cardiovascular: Irregularly irregular, rate controlled Respiratory: CTAB without wheezing/rhonchi/rales Abdomen: soft, non-distended, non-tender in all quadrants, no rebound or guarding Extremities: No edema, SCD's in place   Laboratory: Recent Labs  Lab 04/28/20 1604 04/30/20 0240  WBC 6.6 7.3  HGB 12.5 11.9*  HCT 38.7 38.4  PLT 209 180   Recent Labs  Lab 04/29/20 0125 04/29/20 0910 04/30/20 0240 05/01/20 0334 05/02/20 0219  NA 139   < > 137 138 136  K 2.8*   < > 4.2 4.8 5.4*  CL 102   < > 103 106 104  CO2 29   < > 27 23 20*  BUN 14   < > 15 22 29*  CREATININE 0.91   < > 1.53* 2.11* 2.40*  CALCIUM 8.8*   < > 8.7* 8.6* 8.6*  PROT 5.9*  --   --   --   --   BILITOT 1.1  --   --   --   --   ALKPHOS 46  --   --   --   --   ALT 20  --   --   --   --   AST 37  --   --   --   --   GLUCOSE 162*   < > 113* 109* 89   < > = values in this interval not displayed.    Imaging/Diagnostic Tests: 3/14 Renal U/S IMPRESSION: 1. Negative for hydronephrosis. 2. Kidneys appear slightly echogenic and there is mild diffuse cortical thinning on the right, consistent with medical renal disease. 3. Lobulated contour of the liver is suspicious for cirrhosis. There is a small amount of ascites in the upper quadrants. 4. Thick-walled appearance of urinary bladder, possible cystitis  [name] Dick, DO 05/02/2020, 3:02 PM PGY-1, [facility] Family Medicine FPTS Intern pager: [phone], text pages welcome

## 2020-05-03 DIAGNOSIS — E876 Hypokalemia: Secondary | ICD-10-CM | POA: Diagnosis not present

## 2020-05-03 LAB — BASIC METABOLIC PANEL
Anion gap: 12 (ref 5–15)
BUN: 40 mg/dL — ABNORMAL HIGH (ref 8–23)
CO2: 20 mmol/L — ABNORMAL LOW (ref 22–32)
Calcium: 8.7 mg/dL — ABNORMAL LOW (ref 8.9–10.3)
Chloride: 106 mmol/L (ref 98–111)
Creatinine, Ser: 2.81 mg/dL — ABNORMAL HIGH (ref 0.44–1.00)
GFR, Estimated: 16 mL/min — ABNORMAL LOW (ref >60)
Glucose, Bld: 111 mg/dL — ABNORMAL HIGH (ref 70–99)
Potassium: 4.9 mmol/L (ref 3.5–5.1)
Sodium: 138 mmol/L (ref 135–145)

## 2020-05-03 LAB — SARS CORONAVIRUS 2 (TAT 6-24 HRS): SARS Coronavirus 2: NEGATIVE

## 2020-05-03 NOTE — Progress Notes (Signed)
This chaplain returned to the Pt. bedside to offer spiritual care to the Pt. daughter-[name] [name]. [name] [name] is joined by her daughter and brother-[name] [name]. The chaplain is appreciative of the RN's support.  The chaplain introduced herself and listened to the Pt. 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   The chaplain and [name] [name] step outside the Pt. room to continue the conversation. The chaplain offers reflective listening and emotional support as [name] [name] shares her anticipatory grief.  The chaplain understands [name] [name] recognizes her mother's changes in quality of life and accepts the decision for Hospice care. [name] [name] feels supported by her family, community, and faith as she experiencing the loss one day at time along with her own healthcare uncertainties.   [name] [name] accepted the chaplain's invitation for F/U spiritual care and prayer.

## 2020-05-03 NOTE — TOC Progression Note (Signed)
Transition of Care [facility]) - Progression Note    Patient Details  Name: [name] [name] MRN: [id number] Date of Birth: 1928/03/02  [facility]) CM/SW [place], [place] Phone Number: 05/03/2020, 11:55 AM  Clinical Narrative:      CSW gave bed offers to [place]. Sunday Spillers chose [facility]. CSW confirmed that [facility] can accept pt at discharge. CSW stated insurance auth for [facility] and [address] transportation. CSW requested covid test.   Expected Discharge Plan: Skilled Nursing Facility Barriers to Discharge: Continued Medical Work up  Expected Discharge Plan and Services Expected Discharge Plan: [place] arrangements for the past 2 months: Single Family Home                                       Social Determinants of Health (SDOH) Interventions    Readmission Risk Interventions No flowsheet data found.  Emeterio Reeve, Latanya Presser, [place] Social Worker [phone]

## 2020-05-03 NOTE — Progress Notes (Signed)
Patient tolerates pills whole, one at a time. Disoriented at times but able to be reoriented. Daughter at bedside.

## 2020-05-03 NOTE — Progress Notes (Signed)
Occupational Therapy Treatment Patient Details Name: [name] [name] MRN: [id number] DOB: [birth year] Today's Date: 05/03/2020    History of present illness 87 yo with falls was brought to ED, has cognitive changes, dementia, EF 40-45%, CHF, has low K+, overactive bladder, HTN, PAF, elevated TSH.  PMHx:  HTN, depression, HLD, PAF, hypothyroidism,   OT comments  Pt sitting EOB with NT requesting to use BSC upon arrival. Min A sit - stand from EOB to RW, min guard A to SPT to BSC, max A for posterior hygiene, min A SPT back to bed with pt able to side step towards HOB. Mod A to return to supine. Pt fatigued after toileting tasks. Pt's daughter and grand daughter present this session. OT will continue to follow acutely  Follow Up Recommendations  SNF;Supervision/Assistance - 24 hour    Equipment Recommendations   none   Recommendations for Other Services      Precautions / Restrictions Precautions Precautions: Fall Precaution Comments: pt has near incontinence Restrictions Weight Bearing Restrictions: No       Mobility Bed Mobility Overal bed mobility: Needs Assistance Bed Mobility: Sit to Supine       Sit to supine: Mod assist   General bed mobility comments: mod A with managing UB and LEs    Transfers Overall transfer level: Needs assistance Equipment used: Rolling walker (2 wheeled) Transfers: Sit to/from Stand Sit to Stand: Min assist;Min guard              Balance Overall balance assessment: History of Falls;Needs assistance Sitting-balance support: Feet supported Sitting balance-Leahy Scale: Fair     Standing balance support: Bilateral upper extremity supported;During functional activity Standing balance-Leahy Scale: Poor                             ADL either performed or assessed with clinical judgement   ADL Overall ADL's : Needs assistance/impaired     Grooming: Wash/dry hands;Wash/dry face;Min guard;Cueing for safety;Cueing for  sequencing           Upper Body Dressing : Min guard;Sitting       Toilet Transfer: Minimal assistance;Min guard;Ambulation;RW;Stand-pivot;Cueing for safety;Cueing for sequencing   Toileting- Clothing Manipulation and Hygiene: Total assistance;Sit to/from stand Toileting - Clothing Manipulation Details (indicate cue type and reason): max A for posterior hygiene standing at [facility] with RW     Functional mobility during ADLs: Minimal assistance;Min guard;Rolling walker;Cueing for sequencing;Cueing for safety       Vision Patient Visual Report: No change from baseline     Perception     Praxis      Cognition Arousal/Alertness: Awake/alert Behavior During Therapy: WFL for tasks assessed/performed Overall Cognitive Status: Within Functional Limits for tasks assessed                                 General Comments: pt has had two falls in the last 30 days but cause is unknown        Exercises     Shoulder Instructions       General Comments      Pertinent Vitals/ Pain       Pain Assessment: No/denies pain  Home Living  Prior Functioning/Environment              Frequency  Min 2X/week        Progress Toward Goals  OT Goals(current goals can now be found in the care plan section)  Progress towards OT goals: OT to reassess next treatment     Plan Discharge plan remains appropriate    Co-evaluation                 AM-PAC OT "6 Clicks" Daily Activity     Outcome Measure   Help from another person eating meals?: None Help from another person taking care of personal grooming?: A Little Help from another person toileting, which includes using toliet, bedpan, or urinal?: A Lot Help from another person bathing (including washing, rinsing, drying)?: A Little Help from another person to put on and taking off regular upper body clothing?: None Help from another person to put  on and taking off regular lower body clothing?: A Little 6 Click Score: 19    End of Session Equipment Utilized During Treatment: Gait belt;Rolling walker;Other (comment) (BSC)  OT Visit Diagnosis: Unsteadiness on feet (R26.81);Repeated falls (R29.6);Muscle weakness (generalized) (M62.81)   Activity Tolerance Patient limited by fatigue   Patient Left in bed;with call bell/phone within reach;with bed alarm set;with family/visitor present   Nurse Communication          Time: 7829-5621 OT Time Calculation (min): 17 min  Charges: OT General Charges $OT Visit: 1 Visit OT Treatments $Self Care/Home Management : 8-22 mins    Britt Bottom 05/03/2020, 4:57 PM

## 2020-05-03 NOTE — Progress Notes (Signed)
This chaplain attempted to respond to spiritual care consult for the Pt. [name].    [name] [name] has stepped away from the Pt. room.  The chaplain is appreciative of the Pt. RN-Annabelle offer to page the chaplain if [name] [name] returns today.

## 2020-05-04 DIAGNOSIS — N179 Acute kidney failure, unspecified: Secondary | ICD-10-CM | POA: Diagnosis not present

## 2020-05-04 NOTE — Progress Notes (Signed)
Palliative Medicine RN Note: I rec'd a call from our [place], who is out of the office today. She got a call from FMTS asking for [place] to revisit [place] with family. I have paged out the request to the staff who is working today; no one has been able to pick it up yet.  I reviewed the chart. The only notable change I see is that the peer to peer was denied. I called [place], and we reviewed the pt together. At this time, she does not appear to be appropriate for [facility], as she is still taking whole pills, drinking Ensures BID and has no symptoms being treated. She likely needs long-term/custodial care with hospice, NOT SNF.  Dr Hilma Favors will review the patient later and make recommendations.  Marjie Skiff Zeniyah Peaster, RN, BSN, [facility] Palliative Medicine Team 05/04/2020 12:37 PM Office [phone]

## 2020-05-04 NOTE — TOC Progression Note (Signed)
Transition of Care [facility]) - Progression Note    Patient Details  Name: [name] [name] MRN: [id number] Date of Birth: 1928/04/25  Transition of Care [facility]) CM/SW [place], [place] Phone Number: 05/04/2020, 4:34 PM  Clinical Narrative:     CSW spoke to Pt, daughter Sunday Spillers, grand daughter and son at bedside. CSW informed family that that since pts insurance denied SNF they will have to have new plan. Sunday Spillers stated that pt has already applied for medicaid and has a pending number. Sunday Spillers stated she had the information written down at hoe and will call csw with that info. CSW stated that is her medicaid is pending they can see of a facility will accept that with an LOG.   CSW explained if they cant find a facility that will accept pending medicaid that the family will have to take her home and provide the care until its approved. Family stated there other brother will be in tomorrow and they will discuss it and get back to CSW with an answer.   CSW to follow.   Expected Discharge Plan: [place] Barriers to Discharge: Continued Medical Work up  Expected Discharge Plan and Services Expected Discharge Plan: [place] arrangements for the past 2 months: Single Family Home                                       Social Determinants of Health (SDOH) Interventions    Readmission Risk Interventions No flowsheet data found.  Emeterio Reeve, Latanya Presser, [place] Social Worker [phone]

## 2020-05-04 NOTE — Progress Notes (Signed)
Called Dr Amalia Hailey for peer to peer review for SNF with hospice. He said pt does not qualify for SNF with hospice as she will likely not improve after being at the SNF and her prognosis is poor. He recommended that she may qualify for inpatient hospice. We should discuss this with Dr Hilma Favors later today for his disposition and also [facility].  [facility]

## 2020-05-04 NOTE — Progress Notes (Addendum)
Family Medicine Teaching Service Daily Progress Note Intern Pager: [phone]  Patient name: Jeananne Bedwell Medical record number: [id number] Date of birth: 10/21/1928 Age: 87 y.o. Gender: female  Primary Care Provider: Merrilee Seashore, MD Consultants: Cardiology (s/o) Code Status: DNR  Pt Overview and Major Events to Date:  3/10: Admitted  3/11: Recommended for SNF 3/12: Cardiology s/o 3/14: Palliative consult 3/15: Medically clear to SNF with hospice  Assessment and Plan: Nealie Mchatton a 87 y.o.femalewho was admitted s/p fall and now found to have decline in kidney function. PMH is significant for paroxysmal A. fib not on anticoagulation, aortic stenosis, HTN, hyperlipidemia, RA, dementia, depression. Patient is medically clear to discharge to Long-term care facility with hospice.  Fall  Cognitive decline  Dementia Palliative consulted given overall decline and family has decided on SNF with hospice. Daughter has decided on [facility]. Unfortunately though, peer-to-peer denied as patient is declining and will likely not show improvement with rehab.  -CSW following, appreciate assistance with long-term care placement; insurance may present barriers -Fall precautions -OOB with assistance -Continue home donepezil and memantine  AKI: stable, no further workup Creatinine has been worsening. Upon discussion with daughter, have opted to stop further checks and work up and focus more on comfort of patient.  -No more labs  HFrEF EF40-45%  Severe mitral and tricuspid regurgitation  Aortic stenosis Cardiology consulted and do not recommend aggressive guideline directed medical therapy given her age, inactivity and advanced dementia.Weightstable.  -Continue metoprolol 12.5mg  BID -Strict I/O's -D/c Daily weights  Incidental U/S finding concerning for cirrhosis Renal U/S with incidental finding of lobulated contour of liver suggestive of cirrhosis. Also  with slight ascites in the upper quadrants.  -No intervention at this time  A. Fib, permanent: chronic, stable -Continue metoprolol 12.5mg  BID  Overactive bladder: chronic, stable -Continue home Myrbetriq 25 mg daily -Holding home oxybutynin; recommend d/c on discharge due to BEERs criteria  Hypertension: chronic, stable BP's ranging97-131/72-88. Most recent BP 131/88. -Continue home metoprolol  Hyperlipidemia: chronic, stable -D/c rosuvastatin  Depression: chronic, stable -Continue home sertraline100 mg daily  Subclinical hypothyroidism: stable TSH 7.115, free T4 0.73. -No intervention at this time   FEN/GI: Regular diet PPx: Lovenox   Status is: Inpatient  Remains inpatient appropriate because:Awaiting SNF with hospice placement   Dispo: The patient is from: Home              Anticipated d/c is to: SNF with hospice              Patient currently is medically stable to d/c.   Difficult to place patient No   Subjective:  Patient is accompanied by her daughter this morning.  Daughter notes that patient continues to seem weak overall.  She is looking for to SNF placement for patient.  Patient has no complaints this morning.  She is getting up with NT to sit in recliner.  Objective: Temp:  [98 F (36.7 C)-99.7 F (37.6 C)] 98 F (36.7 C) (03/16 0507) Pulse Rate:  [79-99] 92 (03/16 0507) Resp:  [16-20] 16 (03/16 0507) BP: (114-154)/(62-100) 122/87 (03/16 0507) SpO2:  [93 %-98 %] 95 % (03/16 0507) Weight:  [84.5 kg] 84.5 kg (03/16 0507) Physical Exam: General: Elderly, appears generally weak Cardiovascular: Irregularly irregular Respiratory: CTAB without wheezing/rhonchi/rales Abdomen: soft, non-tender in all quadrants, non-distended, no rebound or guarding Extremities: No edema, 2+ DP and radial pulses   Laboratory: Recent Labs  Lab 04/28/20 1604 04/30/20 0240  WBC 6.6 7.3  HGB  12.5 11.9*  HCT 38.7 38.4  PLT 209 180   Recent Labs  Lab  04/29/20 0125 04/29/20 0910 05/02/20 0219 05/02/20 1520 05/03/20 0325  NA 139   < > 136 137 138  K 2.8*   < > 5.4* 4.8 4.9  CL 102   < > 104 106 106  CO2 29   < > 20* 20* 20*  BUN 14   < > 29* 36* 40*  CREATININE 0.91   < > 2.40* 2.70* 2.81*  CALCIUM 8.8*   < > 8.6* 8.5* 8.7*  PROT 5.9*  --   --   --   --   BILITOT 1.1  --   --   --   --   ALKPHOS 46  --   --   --   --   ALT 20  --   --   --   --   AST 37  --   --   --   --   GLUCOSE 162*   < > 89 161* 111*   < > = values in this interval not displayed.    Imaging/Diagnostic Tests: None new.   Sharion Settler, DO 05/04/2020, 7:11 AM PGY-1, [place] Intern pager: [phone], text pages welcome

## 2020-05-04 NOTE — Progress Notes (Signed)
Physical Therapy Treatment Patient Details Name: [name] [name] MRN: [id number] DOB: September 27, 1928 Today's Date: 05/04/2020    History of Present Illness 87 yo with falls was brought to ED, has cognitive changes, dementia, EF 40-45%, CHF, has low K+, overactive bladder, HTN, PAF, elevated TSH.  PMHx:  HTN, depression, HLD, PAF, hypothyroidism,    PT Comments    Pt is notably getting up to exercise with better control of standing balance on walker but requiring more help to stand.  Pt is a bit sleepy, and may be  Tired from the length of time in her chair.  Follow along with her to continue to work on walking distances and control of standing balance.  Her plan is to get home after rehab and this is still appropriate.  Follow Up Recommendations  SNF     Equipment Recommendations  None recommended by PT    Recommendations for Other Services       Precautions / Restrictions Precautions Precautions: Fall Precaution Comments: pt has near incontinence Restrictions Weight Bearing Restrictions: No    Mobility  Bed Mobility               General bed mobility comments: up in chair when PT arrives    Transfers Overall transfer level: Needs assistance Equipment used: Rolling walker (2 wheeled) Transfers: Sit to/from Stand Sit to Stand: Mod assist         General transfer comment: mod assist but has been in chair awhile  Ambulation/Gait             General Gait Details: fatigued after ex   Stairs             Wheelchair Mobility    Modified Rankin (Stroke Patients Only)       Balance Overall balance assessment: History of Falls;Needs assistance Sitting-balance support: Feet supported Sitting balance-Leahy Scale: Fair     Standing balance support: Bilateral upper extremity supported;During functional activity Standing balance-Leahy Scale: Poor                              Cognition Arousal/Alertness: Awake/alert Behavior During  Therapy: WFL for tasks assessed/performed Overall Cognitive Status: Within Functional Limits for tasks assessed                                 General Comments: able to follow instructions for standing ex      Exercises General Exercises - Lower Extremity Ankle Circles/Pumps: Standing;5 reps Gluteal Sets: Strengthening;10 reps Long Arc Quad: Strengthening;10 reps Heel Slides: Strengthening;10 reps Hip ABduction/ADduction: Strengthening;10 reps Hip Flexion/Marching: AROM;10 reps Toe Raises: Strengthening;5 reps Mini-Sqauts: Strengthening;5 reps    General Comments General comments (skin integrity, edema, etc.): Pt is up to stand with RW and is unsteady until set but then can do some standing ex's with help to balance      Pertinent Vitals/Pain Pain Assessment: No/denies pain    Home Living                      Prior Function            PT Goals (current goals can now be found in the care plan section) Acute Rehab PT Goals Patient Stated Goal: to get stronger prior to going home Progress towards PT goals: Progressing toward goals    Frequency    Min 3X/week  PT Plan Current plan remains appropriate    Co-evaluation              AM-PAC PT "6 Clicks" Mobility   Outcome Measure  Help needed turning from your back to your side while in a flat bed without using bedrails?: A Little Help needed moving from lying on your back to sitting on the side of a flat bed without using bedrails?: A Lot Help needed moving to and from a bed to a chair (including a wheelchair)?: A Lot Help needed standing up from a chair using your arms (e.g., wheelchair or bedside chair)?: A Lot Help needed to walk in hospital room?: A Little Help needed climbing 3-5 steps with a railing? : A Lot 6 Click Score: 14    End of Session Equipment Utilized During Treatment: Gait belt Activity Tolerance: Patient tolerated treatment well Patient left: in chair;with  call bell/phone within reach;with chair alarm set;with family/visitor present Nurse Communication: Mobility status PT Visit Diagnosis: Unsteadiness on feet (R26.81);Muscle weakness (generalized) (M62.81)     Time: 1658-0063 PT Time Calculation (min) (ACUTE ONLY): 28 min  Charges:  $Therapeutic Exercise: 8-22 mins $Therapeutic Activity: 8-22 mins                    Ramond Dial 05/04/2020, 4:40 PM Mee Hives, PT MS Acute Rehab Dept. Number: [place] [facility] [place]

## 2020-05-05 DIAGNOSIS — R54 Age-related physical debility: Secondary | ICD-10-CM | POA: Diagnosis not present

## 2020-05-05 DIAGNOSIS — E46 Unspecified protein-calorie malnutrition: Principal | ICD-10-CM

## 2020-05-05 DIAGNOSIS — E876 Hypokalemia: Secondary | ICD-10-CM | POA: Diagnosis not present

## 2020-05-05 DIAGNOSIS — W19XXXA Unspecified fall, initial encounter: Secondary | ICD-10-CM | POA: Diagnosis not present

## 2020-05-05 MED ORDER — ONDANSETRON HCL 4 MG/2ML IJ SOLN
4.0000 mg | Freq: Four times a day (QID) | INTRAMUSCULAR | Status: DC | PRN
Start: 2020-05-05 — End: 2020-05-06

## 2020-05-05 MED ORDER — BIOTENE DRY MOUTH MT LIQD: 15.0000 mL | OROMUCOSAL | Status: DC | PRN

## 2020-05-05 MED ORDER — HALOPERIDOL 0.5 MG PO TABS
0.5000 mg | ORAL_TABLET | ORAL | Status: DC | PRN
  Filled 2020-05-05: qty 1

## 2020-05-05 MED ORDER — HALOPERIDOL LACTATE 5 MG/ML IJ SOLN
0.5000 mg | INTRAMUSCULAR | Status: DC | PRN
Start: 2020-05-05 — End: 2020-05-06

## 2020-05-05 MED ORDER — LORAZEPAM 2 MG/ML IJ SOLN: 1.0000 mg | INTRAMUSCULAR | Status: DC | PRN

## 2020-05-05 MED ORDER — GLYCOPYRROLATE 1 MG PO TABS
1.0000 mg | ORAL_TABLET | ORAL | Status: DC | PRN
  Filled 2020-05-05: qty 1

## 2020-05-05 MED ORDER — LORAZEPAM 2 MG/ML PO CONC: 1.0000 mg | ORAL | Status: DC | PRN

## 2020-05-05 MED ORDER — GLYCOPYRROLATE 0.2 MG/ML IJ SOLN: 0.2000 mg | INTRAMUSCULAR | Status: DC | PRN

## 2020-05-05 MED ORDER — POLYVINYL ALCOHOL 1.4 % OP SOLN
1.0000 [drp] | Freq: Four times a day (QID) | OPHTHALMIC | Status: DC | PRN
  Administered 2020-05-09: 1 [drp] via OPHTHALMIC
  Filled 2020-05-05: qty 15

## 2020-05-05 MED ORDER — HALOPERIDOL LACTATE 2 MG/ML PO CONC
0.5000 mg | ORAL | Status: DC | PRN
  Filled 2020-05-05: qty 0.3

## 2020-05-05 MED ORDER — BISACODYL 10 MG RE SUPP: 10.0000 mg | Freq: Every day | RECTAL | Status: DC | PRN

## 2020-05-05 MED ORDER — LORAZEPAM 1 MG PO TABS: 1.0000 mg | ORAL_TABLET | ORAL | Status: DC | PRN

## 2020-05-05 MED ORDER — DIPHENHYDRAMINE HCL 50 MG/ML IJ SOLN: 12.5000 mg | INTRAMUSCULAR | Status: DC | PRN

## 2020-05-05 MED ORDER — ONDANSETRON 4 MG PO TBDP
4.0000 mg | ORAL_TABLET | Freq: Four times a day (QID) | ORAL | Status: DC | PRN
  Administered 2020-05-05 – 2020-05-11 (×2): 4 mg via ORAL
  Filled 2020-05-05 (×2): qty 1

## 2020-05-05 NOTE — Care Management (Signed)
[employer] denial letter for SNF given to patient's daughter.   Magdalen Spatz RN

## 2020-05-05 NOTE — Progress Notes (Signed)
Family Medicine Teaching Service Daily Progress Note Intern Pager: [phone]  Patient name: [name] [name] Medical record number: [id number] Date of birth: [birth year] Age: 87 y.o. Gender: female  Primary Care Provider: Merrilee Seashore, MD Consultants: Cardiology (s/o) Code Status: DNR  Pt Overview and Major Events to Date:  3/10: Admitted  3/11: Recommended for SNF 3/12: Cardiology s/o 3/14: Palliative consult 3/15: Medically clear to SNF with hospice 3/16: Peer to peer and SNF denied. Recommended for long term care facility with hospice 3/17: Decision made to switch to comfort care only   Assessment and Plan: Dorinda Caddell Davisis a 87 y.o.femalewho was admitted s/p falland now found to have decline in kidney function. PMH is significant for paroxysmal A. fib not on anticoagulation, aortic stenosis, HTN, hyperlipidemia, RA, dementia, depression.Patient is medically clear to discharge to Long-term care facility with hospice.  Fall  Overall Decline  Dementia: Now Comfort Care Discussed with patients daughter, son, and daughter in law outside of the room this morning. [name] [name], Palliative PA-C, also joined conversation. Daughter was tearful and expressed wishes to keep her mother comfortable. She was amenable to switching to comfort care only measures. Would like for patient to be placed in an area close enough for her to visit. Due to her own medical issues and other responsibilities (such as also caring for her brother at an ALF) she is unable to care for her mother at home while Medicaid is in process. Appreciate TOC's assistance in helping with placement for this patient. Patient now comfort care only.  -F/u CSW -Fall precautions -OOB with assistance -Continue donepezil and memantine -Continue Zoloft 100 mg daily  -Acetaminophen 650 mg PRN mild pain, fever -Antiseptic oral rinse PRN dry mouth -Bisacodyl suppository daily PRN moderate constipation -Benadryl 12.5 mg  IV q4h PRN itching -Glycoyrrolate 1 mg q4h PRN excessive secretions -Haldol 0.5 mg q4h PRN agitation or delirium  -Ativan 1 mg q4h PRN anxiety  -Zofran 4 mg q6h PRN nausea -Liquifilm tears 1 drop both eyes QID PRN dry eyes    Given comfort measure only, conditions below no longer being treated.  AKI HFrEF EF40-45%  Severe mitral and tricuspid regurgitation  Aortic stenosis A. Fib, permanent Overactive bladder Hypertension Hypelipidemia   FEN/GI: Regular diet PPx: Lovenox   Status is: Inpatient  Remains inpatient appropriate because:Unsafe d/c plan   Dispo: The patient is from: Home              Anticipated d/c is to: Long term care facility with hospice              Patient currently is medically stable to d/c.   Difficult to place patient No   Subjective:  [name] has no complaints this AM. She would like assistance to the bedside commode as she has to urinate. She denies any pain.   Discussed with daughter, son and daughter in law (son's wife) outside of the room. Son and daughter in law drove from [place] (about 20 hours) to be with her. Daughter, [name] [name], is tearful and states that she is unable to care for patient at home while Medicaid is in process. She is struggling with her own uncertain medical diagnosis and on top of that is also caring for her brother who is at an ALF. She states that she would end up in the hospital herself if she had to take on that added responsibility right now. She is tearful in telling me this but reiterates that she just wants the best for  her mother. Her wishes are to keep her mother comfortable. She also does not want her to be placed in a facility that is far from them, would like her to be close. States that she understands that her mothers prognosis is poor and that she is mentally preparing herself for the eventual outcome.   Objective: Temp:  [97.5 F (36.4 C)-98 F (36.7 C)] 98 F (36.7 C) (03/16 2008) Pulse Rate:  [80-84] 80  (03/16 2008) Resp:  [17-19] 17 (03/16 2008) BP: (122)/(87-93) 122/87 (03/16 2008) SpO2:  [96 %-97 %] 97 % (03/16 2008) Physical Exam: General: Awake, alert, oriented x4, frail appearing  Cardiovascular: Irregularly irregular Respiratory: CTAB  Abdomen: Non-tender, non-distended  Laboratory: Recent Labs  Lab 04/28/20 1604 04/30/20 0240  WBC 6.6 7.3  HGB 12.5 11.9*  HCT 38.7 38.4  PLT 209 180   Recent Labs  Lab 04/29/20 0125 04/29/20 0910 05/02/20 0219 05/02/20 1520 05/03/20 0325  NA 139   < > 136 137 138  K 2.8*   < > 5.4* 4.8 4.9  CL 102   < > 104 106 106  CO2 29   < > 20* 20* 20*  BUN 14   < > 29* 36* 40*  CREATININE 0.91   < > 2.40* 2.70* 2.81*  CALCIUM 8.8*   < > 8.6* 8.5* 8.7*  PROT 5.9*  --   --   --   --   BILITOT 1.1  --   --   --   --   ALKPHOS 46  --   --   --   --   ALT 20  --   --   --   --   AST 37  --   --   --   --   GLUCOSE 162*   < > 89 161* 111*   < > = values in this interval not displayed.    Imaging/Diagnostic Tests: None new.   Sharion Settler, DO 05/05/2020, 6:23 AM PGY-1, [place] Intern pager: [phone], text pages welcome

## 2020-05-05 NOTE — Progress Notes (Signed)
Family Medicine Teaching Service Daily Progress Note Intern Pager: [phone]  Patient name: [name] [name] Medical record number: [id number] Date of birth: Jun 10, 1928 Age: 87 y.o. Gender: female  Primary Care Provider: Merrilee Seashore, MD Consultants: Palliative Code Status: DNR  Pt Overview and Major Events to Date:  3/10: Admitted  3/11: Recommended for SNF 3/12: Cardiology s/o 3/14: Palliative consult 3/15: Medically clear to SNF with hospice 3/16: Peer to peer and SNF denied. Recommended for long term care facility with hospice 3/17: Decision made to switch to comfort care only   Assessment and Plan: Peityn Caddell Davisis a 87 y.o.femalewho was admitted s/p falland now found to have decline in kidney function. PMH is significant for paroxysmal A. fib not on anticoagulation, aortic stenosis, HTN, hyperlipidemia, RA, dementia, depression.Patient is medically clear to discharge toLong-term care facilitywith hospice.  Fall  Overall Decline  Dementia: Now Comfort Care Patient without complaints. Daughter notes she appears to be comfortable. She did have some nausea and vomiting yesterday that resolved with Zofran. She does not currently meet criteria for inpatient hospice. Long-term care facility with hospice does not appear to be an option while Medicaid is pending. Appreciate CSW assistance with this patient.  -D/c IV -F/u CSW updates -Fall precautions -OOB with assistance -Continue Zoloft 100 mg daily  Comfort medications:  -Acetaminophen 650 mg PRN mild pain, fever  -Antiseptic oral rinse PRN dry mouth  -Bisacodyl suppository daily PRN moderate constipation  -Benadryl 12.5 mg IV q4h PRN itching  -Glycoyrrolate 1 mg q4h PRN excessive secretions  -Haldol 0.5 mg q4h PRN agitation or delirium   -Ativan 1 mg q4h PRN anxiety   -Zofran 4 mg q6h PRN nausea  -Liquifilm tears 1 drop both eyes QID PRN dry eyes   Given comfort measure only, conditions below no longer  being treated.  AKI HFrEF EF40-45%  Severe mitral and tricuspid regurgitation  Aortic stenosis A. Fib, permanent Overactive bladder Hypertension Hypelipidemia  FEN/GI: Regular diet PPx: None since comfort care   Status is: Inpatient  Remains inpatient appropriate because:Unsafe d/c plan   Dispo: The patient is from: Home              Anticipated d/c is to: Awaiting LTAC facility or residential hospice              Patient currently is medically stable to d/c.   Difficult to place patient No    Subjective:  Patient without complaints this AM. Accompanied by daughter at bedside. Daughter states that patient did well and drank two small waters yesterday. She is still weak but feels that she is comfortable.   Objective: Temp:  [97.4 F (36.3 C)-98 F (36.7 C)] 97.4 F (36.3 C) (03/17 1430) Pulse Rate:  [80-86] 86 (03/17 1430) Resp:  [17-18] 18 (03/17 1430) BP: (97-122)/(75-87) 97/75 (03/17 1430) SpO2:  [93 %-97 %] 93 % (03/17 1430) Physical Exam: General: Elderly and frail, intermittently sleeping through encounter, no distress Cardiovascular: Irregularly irregular Respiratory: CTAB in anterior fields  Extremities: 2+ radial and DP pulses, no edema   Laboratory: Recent Labs  Lab 04/30/20 0240  WBC 7.3  HGB 11.9*  HCT 38.4  PLT 180   Recent Labs  Lab 04/29/20 0125 04/29/20 0910 05/02/20 0219 05/02/20 1520 05/03/20 0325  NA 139   < > 136 137 138  K 2.8*   < > 5.4* 4.8 4.9  CL 102   < > 104 106 106  CO2 29   < > 20* 20* 20*  BUN 14   < > 29* 36* 40*  CREATININE 0.91   < > 2.40* 2.70* 2.81*  CALCIUM 8.8*   < > 8.6* 8.5* 8.7*  PROT 5.9*  --   --   --   --   BILITOT 1.1  --   --   --   --   ALKPHOS 46  --   --   --   --   ALT 20  --   --   --   --   AST 37  --   --   --   --   GLUCOSE 162*   < > 89 161* 111*   < > = values in this interval not displayed.   Imaging/Diagnostic Tests: None new.   Sharion Settler, DO 05/05/2020, 6:26 PM PGY-1,  [place] Intern pager: [phone], text pages welcome

## 2020-05-05 NOTE — Progress Notes (Signed)
Daily Progress Note   Patient Name: [name] [name]       Date: 05/05/2020 DOB: [birth year]  Age: 87 y.o. MRN#: [id number] Attending Physician: McDiarmid, Blane Ohara, MD Primary Care Physician: Merrilee Seashore, MD Admit Date: 04/28/2020  Reason for Consultation/Follow-up: Establishing goals of care and Psychosocial/spiritual support  Subjective: Medical records reviewed. Discussed with patient's daughter Sunday Spillers Levander Campion), son, daughter-in-law, Dr. Nita Sells, and pharmacy resident.   Patient's daughter is in great distress having discovered that insurance will not cover SNF with hospice. Her brother and sister-in-law just flew in from [place] this morning and they are also present for this discussion. Levander Campion is emotional and explains that she is unable to safely care for patient in the home due to her own health problems. She desperately wishes that she could. She wonders if SNF would accept patient without hospice, although she becomes tearful when considering this as her primary goal is keeping her mother comfortable.   [name] agrees that a transition to comfort focused care is the best decision for the patient at this time. She is hopeful that the patient may quality for residential hospice upon reevaluation. Patient is a poor candidate for aggressive interventions and will likely decompensate quickly without additional work up/treatment of worsening renal function. Family understands this and display appropriate anticipatory grief. Emotional support provided. PMT contact information provided. Encouraged to call with additional questions or concerns.   Length of Stay: 7   Vital Signs: BP 122/87 (BP Location: Left Arm)   Pulse 80   Temp 98 F (36.7 C) (Axillary)   Resp 17   Ht 5\' 4"  (1.626 m)    Wt 84.5 kg   SpO2 97%   BMI 31.98 kg/m  SpO2: SpO2: 97 % O2 Device: O2 Device: Room Air O2 Flow Rate:    Intake/output summary:   Intake/Output Summary (Last 24 hours) at 05/05/2020 1244 Last data filed at 05/05/2020 6286 Gross per 24 hour  Intake 240 ml  Output 600 ml  Net -360 ml   LBM: Last BM Date: 05/02/20 Baseline Weight: Weight: 72.6 kg Most recent weight: Weight: 84.5 kg   Patient Active Problem List   Diagnosis Date Noted  . Aortic root dilatation ([place]) 05/02/2020  . Weakness   . Acute kidney injury ([place])   . Decreased urine output   .  Goals of care, counseling/discussion   . Palliative care by specialist   . Atrial flutter ([place]) 04/29/2020  . Traumatic Soft tissue injury of hip, left, initial encounter 04/29/2020  . Hypomagnesemia 04/29/2020  . Protein calorie malnutrition ([place]) 04/29/2020  . Frailty syndrome in geriatric patient 04/29/2020  . TSH elevation 04/29/2020  . Dehydration 04/29/2020  . Syncope, possible 04/29/2020  . Hypophosphatemia 04/29/2020  . Fall 04/28/2020  . Dementia ([place]) 01/27/2020  . Pneumonia due to COVID-19 virus 12/23/2019  . Acute hypoxemic respiratory failure ([place]) 12/23/2019  . Hypokalemia with generalized weakness 12/23/2019  . Transaminitis 12/23/2019  . Slow heart rate 03/13/2019  . Mild neurocognitive disorder 02/26/2019  . Clavi 01/20/2019  . Porokeratosis 09/02/2018  . Corns and callosities 09/02/2018  . Elevated CK   . Pressure injury of skin 08/27/2018  . PAF (paroxysmal atrial fibrillation) ([place]) 08/27/2018  . Hypernatremia   . Rhabdomyolysis 08/26/2018  . Normocytic anemia 08/26/2018  . Renal insufficiency 08/26/2018  . Community acquired pneumonia 01/13/2013  . Sepsis, Rule Out 01/13/2013  . Hyponatremia 01/13/2013  . Hypochloremia 01/13/2013  . Rheumatoid arthritis ([place]) 01/13/2013  . Premature atrial contractions 07/22/2012  . Hyperlipidemia 07/22/2012  . Carotid artery bruit 07/22/2012  . Moderate  aortic stenosis 07/22/2012  . Chest pain   . Essential hypertension     Palliative Care Assessment & Plan   Patient Profile: 87 y.o. female  with past medical history of dementia, HTN, HLD, depression, RA/OA, systolic heart failure, recurrent falls, aortic stenosis, afib not on anticoagulation, overactive bladder, and subclinical hypothyroidism admitted on 04/28/2020 with hypokalemia, generalized weakness, and fall.   Patient was admitted for syncope work-up and fall was determined to likely be mechanical. Patient's health has declined, with AKI/renal function worsening during hospitalization. Palliative medicine consulted to assist with determining goals of care  Assessment: AKI Dementia Recurrent falls Protein calorie malnutrition Systolic HF Severe frailty Afib not on anticoagulation HTN HLD Depression  Recommendations/Plan:  Transition to full comfort path today.   She is at very high risk to decline quickly. Depending on clinical course over the next 24-48 hours, will need to reassess appropriateness for residential hospice vs long term care facility with hospice.  Bisacodyl suppository PRN constipation  Benadryl PRN itching  Robinul PRN excessive secretions  Haldol PRN agitation   Ativan PRN anxiety/agitation   Zofran PRN nausea  Comfort feeds as desired  Goals of Care and Additional Recommendations:  Limitations on Scope of Treatment: Full Comfort Care  Code Status:  DNR   Code Status Orders  (From admission, onward)         Start     Ordered   05/05/20 0918  Do not attempt resuscitation (DNR)  Continuous       Question Answer Comment  In the event of cardiac or respiratory ARREST Do not call a "code blue"   In the event of cardiac or respiratory ARREST Do not perform Intubation, CPR, defibrillation or ACLS   In the event of cardiac or respiratory ARREST Use medication by any route, position, wound care, and other measures to relive pain and  suffering. May use oxygen, suction and manual treatment of airway obstruction as needed for comfort.      05/05/20 0922        Code Status History    Date Active Date Inactive Code Status Order ID Comments User Context   04/28/2020 2135 05/05/2020 0922 DNR [id number]  Rise Patience, DO ED   12/23/2019 0533 12/29/2019 2216 Full  Code [id number]  Shela Leff, MD ED   08/26/2018 1714 08/28/2018 2107 Full Code [id number]  Norval Morton, MD ED   01/13/2013 1743 01/16/2013 1400 Full Code [id number]  Cristal Ford, DO ED   Advance Care Planning Activity      Prognosis:  Very poor prognosis given her worsening renal function (last GFR of 16), dementia, failure to thrive, CHF, and multiple valvular abnormalities among other comorbidities. She is at high risk to decompensate very quickly. Will need to closely monitor clinical course over next 24-48 hours and reassess prognosis, as she was only transitioned to full comfort care as of today.  Discharge Planning:  To Be Determined   Thank you for allowing the Palliative Medicine Team to assist in the care of this patient.  Time In:  9:45am Time Out:  10:30am Total Time  45 minutes Prolonged Time Billed  no     Greater than 50% of this time was spent counseling and coordinating care related to the above assessment and plan.  Dorthy Cooler, PA-C Palliative Medicine Team Team phone # [phone]  Thank you for allowing the Palliative Medicine Team to assist in the care of this patient. Please utilize secure chat with additional questions, if there is no response within 30 minutes please call the above phone number.  Palliative Medicine Team providers are available by phone from 7am to 7pm daily and can be reached through the team cell phone.  Should this patient require assistance outside of these hours, please call the patient's attending physician.

## 2020-05-05 NOTE — Progress Notes (Signed)
Nutrition Brief Note  Chart reviewed. Visited pt, daughter at bedside.  Pt now transitioning to comfort care. Continue Regular diet, added extra pepper on all meal trays per request.  No further nutrition interventions warranted at this time.  Please re-consult as needed.   [name] MS, RDN, LDN, CNSC Registered Dietitian III Clinical Nutrition RD Pager and On-Call Pager Number Located in [place]

## 2020-05-05 NOTE — Progress Notes (Signed)
This chaplain is present for F/U spiritual care upon the Pt. transition to comfort focused care. The Pt. is awake and appears to be comfortable in the recliner. The Pt. daughter, son, and granddaughter are at the bedside.  The Pt. follows the speaker as different family members share the stories that are part of the Pt. legacy.  The family is accepting of the chaplain's intercessory prayer and F/U spiritual care as needed.

## 2020-05-06 DIAGNOSIS — E876 Hypokalemia: Secondary | ICD-10-CM | POA: Diagnosis not present

## 2020-05-06 DIAGNOSIS — F0390 Unspecified dementia without behavioral disturbance: Secondary | ICD-10-CM | POA: Diagnosis not present

## 2020-05-06 DIAGNOSIS — N179 Acute kidney failure, unspecified: Secondary | ICD-10-CM | POA: Diagnosis not present

## 2020-05-06 DIAGNOSIS — R55 Syncope and collapse: Secondary | ICD-10-CM

## 2020-05-06 DIAGNOSIS — R54 Age-related physical debility: Secondary | ICD-10-CM | POA: Diagnosis not present

## 2020-05-06 NOTE — Progress Notes (Signed)
PT Cancellation Note  Patient Details Name: [name] [name] MRN: [id number] DOB: [birth year]   Cancelled Treatment:    Reason Eval/Treat Not Completed: Patient declined, no reason specified; patient up in recliner and declined exercise or mobility.  Daughter in the room and attending to pt.  Noted pt on full comfort measures, will sign off and encourage continued nursing assist for mobility when pt needs or willing to mobilize.  Magda Kiel, PT Acute Rehabilitation Services UYEBX:[phone] Office:[phone] 05/06/2020  [name] 05/06/2020, 1:34 PM

## 2020-05-06 NOTE — Progress Notes (Signed)
Daily Progress Note   Patient Name: [name] [name]       Date: 05/06/2020 DOB: May 12, 1928  Age: 87 y.o. MRN#: [id number] Attending Physician: McDiarmid, Blane Ohara, MD Primary Care Physician: Merrilee Seashore, MD Admit Date: 04/28/2020  Reason for Consultation/Follow-up: Psychosocial/spiritual support  Subjective: Medical records reviewed. Discussed with patient's daughter Sunday Spillers [facility]) and RN/Jaimie. Patient is sitting comfortably in the recliner. Her intake is very minimal. Patient's daughter Levander Campion informs me that the patient has urinated twice today. Urine was dark and small in quantity.   Created space and opportunity for thoughts regarding Lorene's current illness and care plan. [name] feels reassured that a safe plan will be determined and appreciates everyone working together to support her family. She is feeling immense relief of anxiety after yesterday's discussion. She remains hopeful for placement at long term care facility with hospice and understands that the patient may rapidly decline at any moment. She would agree with residential hospice placement if this occurs. She agrees that [place] looks comfortable today. Discussed expectations at [name] and provided education that focusing on patient's water intake is not beneficial unless at the patient's request for comfort.   Questions and concerns addressed. Family has PMT contact information. Encouraged to call with additional questions or concerns.   PMT will continue to support holistically.    Length of Stay: 8   Vital Signs: BP 120/72 (BP Location: Left Arm)   Pulse 84   Temp 98 F (36.7 C) (Oral)   Resp 17   Ht 5\' 4"  (1.626 m)   Wt 84.5 kg   SpO2 96%   BMI 31.98 kg/m  SpO2: SpO2: 96 % O2 Device: O2 Device: Room  Air O2 Flow Rate:    Intake/output summary:   Intake/Output Summary (Last 24 hours) at 05/06/2020 1142 Last data filed at 05/06/2020 2197 Gross per 24 hour  Intake 560 ml  Output 450 ml  Net 110 ml   LBM: Last BM Date: 05/02/20 Baseline Weight: Weight: 72.6 kg Most recent weight: Weight: 84.5 kg   Patient Active Problem List   Diagnosis Date Noted  . Aortic root dilatation ([place]) 05/02/2020  . Weakness   . Acute kidney injury ([place])   . Decreased urine output   . Goals of care, counseling/discussion   . Palliative care by specialist   .  Atrial flutter ([place]) 04/29/2020  . Traumatic Soft tissue injury of hip, left, initial encounter 04/29/2020  . Hypomagnesemia 04/29/2020  . Protein calorie malnutrition ([place]) 04/29/2020  . Frailty syndrome in geriatric patient 04/29/2020  . TSH elevation 04/29/2020  . Dehydration 04/29/2020  . Syncope, possible 04/29/2020  . Hypophosphatemia 04/29/2020  . Fall 04/28/2020  . Dementia ([place]) 01/27/2020  . Pneumonia due to COVID-19 virus 12/23/2019  . Acute hypoxemic respiratory failure ([place]) 12/23/2019  . Hypokalemia with generalized weakness 12/23/2019  . Transaminitis 12/23/2019  . Slow heart rate 03/13/2019  . Mild neurocognitive disorder 02/26/2019  . Clavi 01/20/2019  . Porokeratosis 09/02/2018  . Corns and callosities 09/02/2018  . Elevated CK   . Pressure injury of skin 08/27/2018  . PAF (paroxysmal atrial fibrillation) ([place]) 08/27/2018  . Hypernatremia   . Rhabdomyolysis 08/26/2018  . Normocytic anemia 08/26/2018  . Renal insufficiency 08/26/2018  . Community acquired pneumonia 01/13/2013  . Sepsis, Rule Out 01/13/2013  . Hyponatremia 01/13/2013  . Hypochloremia 01/13/2013  . Rheumatoid arthritis ([place]) 01/13/2013  . Premature atrial contractions 07/22/2012  . Hyperlipidemia 07/22/2012  . Carotid artery bruit 07/22/2012  . Moderate aortic stenosis 07/22/2012  . Chest pain   . Essential hypertension     Palliative Care  Assessment & Plan   Patient Profile: 87 y.o. female  with past medical history of dementia, HTN, HLD, depression, RA/OA, systolic heart failure, recurrent falls, aortic stenosis, afib not on anticoagulation, overactive bladder, and subclinical hypothyroidism admitted on 04/28/2020 with hypokalemia, generalized weakness, and fall.   Patient was admitted for syncope work-up and fall was determined to likely be mechanical. Patient's health has declined, with AKI/renal function worsening during hospitalization. Palliative medicine consulted to assist with determining goals of care.  Assessment: AKI Dementia Recurrent falls Protein calorie malnutrition Systolic HF Severe frailty Afib not on anticoagulation HTN HLD Depression  Recommendations/Plan:  Full comfort care - patient is stable and comfortable today  Will continue monitoring for further decline; symptom management as per [facility]  Comfort feeds as desired   Discharge to home is inappropriate as family is unable to safely arrange 24/7 supervision for the patient  Greatly appreciate TOC assistance with long-term care facility placement with hospice services   Goals of Care and Additional Recommendations:  Limitations on Scope of Treatment: Full Comfort Care  Code Status:  DNR   Code Status Orders  (From admission, onward)         Start     Ordered   05/05/20 0918  Do not attempt resuscitation (DNR)  Continuous       Question Answer Comment  In the event of cardiac or respiratory ARREST Do not call a "code blue"   In the event of cardiac or respiratory ARREST Do not perform Intubation, CPR, defibrillation or ACLS   In the event of cardiac or respiratory ARREST Use medication by any route, position, wound care, and other measures to relive pain and suffering. May use oxygen, suction and manual treatment of airway obstruction as needed for comfort.      05/05/20 0922        Code Status History    Date Active Date  Inactive Code Status Order ID Comments User Context   04/28/2020 2135 05/05/2020 0922 DNR [id number]  Rise Patience, DO ED   12/23/2019 0533 12/29/2019 2216 Full Code [id number]  Shela Leff, MD ED   08/26/2018 1714 08/28/2018 2107 Full Code [id number]  Norval Morton, MD ED   01/13/2013 1743 01/16/2013 1400  Full Code [id number]  Cristal Ford, DO ED   Advance Care Planning Activity      Prognosis:  Very poor prognosis given multiple comorbidities including worsening renal function (last GFR of 16), dementia, severe frailty, failure to thrive, CHF, and multiple valvular abnormalities. She is not a candidate for aggressive interventions and at high risk to decompensate.  Discharge Planning:  Will likely need long term care facility with hospice, family is unable to safely care for patient in the home   Thank you for allowing the Palliative Medicine Team to assist in the care of this patient.  Time In:  11:30am Time Out:  11:15am Total Time  15 minutes Prolonged Time Billed  no    Greater than 50% of this time was spent counseling and coordinating care related to the above assessment and plan.   Dorthy Cooler, PA-C Palliative Medicine Team Team phone # [phone]  Thank you for allowing the Palliative Medicine Team to assist in the care of this patient. Please utilize secure chat with additional questions, if there is no response within 30 minutes please call the above phone number.  Palliative Medicine Team providers are available by phone from 7am to 7pm daily and can be reached through the team cell phone.  Should this patient require assistance outside of these hours, please call the patient's attending physician.

## 2020-05-06 NOTE — Progress Notes (Signed)
Family Medicine Teaching Service Daily Progress Note Intern Pager: [phone]  Patient name: [name] [name] Medical record number: [id number] Date of birth: 20-Jan-1929 Age: 87 y.o. Gender: female  Primary Care Provider: Merrilee Seashore, MD Consultants: Palliative Code Status: DNR  Pt Overview and Major Events to Date:  3/10: Admitted  3/11: Recommended for SNF 3/12: Cardiology s/o 3/14: Palliative consult 3/15: Medically clear to SNF with hospice 3/16: Peer to peer and SNF denied. Recommended for long term care facility with hospice 3/17: Decision made to switch to comfort care only   Assessment and Plan: [name] [name] a 87 y.o.femalewho was admitted s/p falland found to have decline in kidney function, ultimately transferred to comfort care. PMH is significant for paroxysmal A. fib not on anticoagulation, aortic stenosis, HTN, hyperlipidemia, RA, dementia, depression.Patient is medically clear to discharge toLong-term care facilitywith hospice.  Fall  Overall Decline  Dementia: Stable, now Comfort Care. Appears overall comfortable.  Disposition complicated while Medicaid pending, daughter and patient hopeful for LTC with hospice. -Social work following, appreciate assistance with placement -Fall precautions -OOB with assistance -Continue Zoloft 100 mg daily  Comfort medications:  -Acetaminophen 650 mg PRN mild pain, fever  -Antiseptic oral rinse PRN dry mouth  -Bisacodyl suppository daily PRN moderate constipation  -Benadryl 12.5 mg IV q4h PRN itching  -Glycoyrrolate 1 mg q4h PRN excessive secretions  -Haldol 0.5 mg q4h PRN agitation or delirium   -Ativan 1 mg q4h PRN anxiety   -Zofran 4 mg q6h PRN nausea  -Liquifilm tears 1 drop both eyes QID PRN dry eyes   Chronic conditions, no longer actively treating:  AKI (Baseline wnl)  HFrEF EF40-45%  Severe mitral and tricuspid regurgitation  Aortic stenosis A. Fib, permanent Overactive  bladder Hypertension Hypelipidemia  FEN/GI: Regular diet as tolerated PPx: None since comfort care   Status is: Inpatient  Remains inpatient appropriate because:Unsafe d/c plan   Dispo: The patient is from: Home              Anticipated d/c is to: Awaiting LTAC facility or residential hospice              Patient currently is medically stable to d/c.   Difficult to place patient No  Subjective:  No acute events overnight.  Reports that she is feeling comfortable and has no concerns.  Daughter is not present at this time.  Objective: Temp:  [98 F (36.7 C)] 98 F (36.7 C) (03/18 0442) Pulse Rate:  [84] 84 (03/18 0442) Resp:  [17] 17 (03/18 0442) BP: (120)/(72) 120/72 (03/18 0442) SpO2:  [96 %] 96 % (03/18 0442) Physical Exam: General: Chronically ill-appearing female, alert and no acute distress HEENT: NCAT Cardiac: Irregularly irregular rate and rhythm Lungs: Clear bilaterally, no increased WOB  Abdomen: soft  Ext: Warm, dry, 2+ distal radial pulses  Laboratory: Recent Labs  Lab 04/30/20 0240  WBC 7.3  HGB 11.9*  HCT 38.4  PLT 180   Recent Labs  Lab 05/02/20 0219 05/02/20 1520 05/03/20 0325  NA 136 137 138  K 5.4* 4.8 4.9  CL 104 106 106  CO2 20* 20* 20*  BUN 29* 36* 40*  CREATININE 2.40* 2.70* 2.81*  CALCIUM 8.6* 8.5* 8.7*  GLUCOSE 89 161* 111*   Imaging/Diagnostic Tests: None new.   Patriciaann Clan, DO 05/06/2020, 9:52 PM PGY-3, [place] Intern pager: [phone], text pages welcome

## 2020-05-06 NOTE — TOC Progression Note (Signed)
Transition of Care [facility]) - Progression Note    Patient Details  Name: [name] [name] MRN: [id number] Date of Birth: [birth year]  Transition of Care [facility]) CM/SW [place], [place] Phone Number: 05/06/2020, 4:05 PM  Clinical Narrative:     CSW sent email to financial counseling to inquire about medicaid status. Family will need to pursue LTC or take pt home if she is not hospice appropriate. Family is aware but is unsure of what they want to do.   Expected Discharge Plan: [place] Barriers to Discharge: Continued Medical Work up  Expected Discharge Plan and Services Expected Discharge Plan: [place] arrangements for the past 2 months: Single Family Home                                       Social Determinants of Health (SDOH) Interventions    Readmission Risk Interventions No flowsheet data found.  Emeterio Reeve, Latanya Presser, [place] Social Worker [phone]

## 2020-05-06 NOTE — Progress Notes (Addendum)
Pt daughter would prefer to leave the IV in and flush it each shift, so if her mother needs IV medication for comfort, she doesn't have to be stuck again to put in a new IV.  Pt up in the chair most of the day. Pt played cards with family member, ate about 10% of meals with daughter, drank about 500 mL during dayshift.  Pt up with walker to the [facility] x2 today.

## 2020-05-07 DIAGNOSIS — W19XXXD Unspecified fall, subsequent encounter: Secondary | ICD-10-CM

## 2020-05-07 DIAGNOSIS — N179 Acute kidney failure, unspecified: Secondary | ICD-10-CM | POA: Diagnosis not present

## 2020-05-07 DIAGNOSIS — F0390 Unspecified dementia without behavioral disturbance: Secondary | ICD-10-CM | POA: Diagnosis not present

## 2020-05-07 DIAGNOSIS — E860 Dehydration: Secondary | ICD-10-CM | POA: Diagnosis not present

## 2020-05-07 DIAGNOSIS — E876 Hypokalemia: Secondary | ICD-10-CM | POA: Diagnosis not present

## 2020-05-07 NOTE — Progress Notes (Signed)
Pt family came to play cards, give her a massage, and do her nails.  Pt ate fried chicken that family member brought in, and sipped on water throughout the day.  Pt continent and able to move with walker from chair/bed/bedside commode with +1 assist.

## 2020-05-07 NOTE — Progress Notes (Signed)
Pt alert. Daughter came in to bathe her this morning.

## 2020-05-08 DIAGNOSIS — E46 Unspecified protein-calorie malnutrition: Secondary | ICD-10-CM | POA: Diagnosis not present

## 2020-05-08 DIAGNOSIS — E876 Hypokalemia: Secondary | ICD-10-CM | POA: Diagnosis not present

## 2020-05-08 DIAGNOSIS — Z515 Encounter for palliative care: Secondary | ICD-10-CM

## 2020-05-08 DIAGNOSIS — R54 Age-related physical debility: Secondary | ICD-10-CM | POA: Diagnosis not present

## 2020-05-08 DIAGNOSIS — N179 Acute kidney failure, unspecified: Secondary | ICD-10-CM | POA: Diagnosis not present

## 2020-05-08 NOTE — Progress Notes (Signed)
Pt had a great day! Several visitors. She played cards with family and friends. Granddaughter brought her a smoothie.  Pt had no complaints of pain.  Pt ate 2 hardboiled eggs for breakfast by herself; upset they didn't bring the butter until her gris were cold and she didn't want to order more.  Pt snacked while family was present.  She used walker to bedside commode/chair/bed throughout the day.  Pt tired at end of shift when family members left.

## 2020-05-08 NOTE — Progress Notes (Signed)
Family Medicine Teaching Service Daily Progress Note Intern Pager: [phone]  Patient name: [name] [name] Medical record number: [id number] Date of birth: 17-Jun-1928 Age: 87 y.o. Gender: female  Primary Care Provider: Merrilee Seashore, MD Consultants: Cardiology (s/o), Palliative  Code Status: DNR   Pt Overview and Major Events to Date:  3/10: Admitted  3/11: Recommended for SNF 3/12: Cardiology s/o 3/14: Palliative consult 3/15: Medically clear to SNF with hospice 3/16: Peer to peer and SNF denied. Recommended for long term care facility with hospice 3/17: Decision made to switch to comfort care only  Assessment and Plan: [name] [name] a 87 y.o.femalewho was admitted s/p falland found to have decline in kidney function, ultimately transferred to comfort care. PMH is significant for paroxysmal A. fib not on anticoagulation, aortic stenosis, HTN, hyperlipidemia, RA, dementia, depression.Patient is medically clear to discharge toLong-term care facilitywith hospice.  Fall OverallDecline  Dementia: Stable, now Comfort Care. Is comfortable at this time.  Disposition complicated while Medicaid pending, daughter and patient hopeful for LTC with hospice. -Social work following, appreciate assistance with placement -Fall precautions -OOB with assistance -Continue Zoloft 100 mg daily  -Comfort medications:             -Acetaminophen 650 mg PRN mild pain, fever             -Antiseptic oral rinse PRN dry mouth             -Bisacodyl suppository daily PRN moderate constipation             -Benadryl 12.5 mg IV q4h PRN itching             -Glycoyrrolate 1 mg q4h PRN excessive secretions             -Haldol 0.5 mg q4h PRN agitation or delirium              -Ativan 1 mg q4h PRN anxiety              -Zofran 4 mg q6h PRN nausea             -Liquifilm tears 1 drop both eyes QID PRN dry eyes   Chronic conditions, no longer actively treating:  AKI (Baseline wnl)  HFrEF  EF40-45%  Severe mitral and tricuspid regurgitation  Aortic stenosis A. Fib, permanent Overactive bladder Hypertension Hypelipidemia  FEN/GI: Regular diet as tolerated PPx: None since comfort care   Status is: Inpatient  Remains inpatient appropriate because:Unsafe d/c plan   Dispo: The patient is from: Home  Anticipated d/c is to: Awaiting LTAC facility or residential hospice  Patient currently is medically stable to d/c.              Difficult to place patient No   Subjective:  No acute events overnight. Patient sitting in the chair eating breakfast with daughter in the room. Daughter states that they are trying to get patient to a long term care facility, as there is nobody in the family that can help with patient if she was to go home.   Objective: Temp:  [97.7 F (36.5 C)-98.5 F (36.9 C)] 97.7 F (36.5 C) (03/20 1404) Pulse Rate:  [74-100] 74 (03/20 1404) Resp:  [16] 16 (03/20 1404) BP: (114-145)/(82-89) 145/89 (03/20 1404) SpO2:  [96 %-97 %] 97 % (03/20 1404) Physical Exam: General: chronically ill appearing, sitting eating breakfast, NAD Cardiovascular: irregularly irregular RR Respiratory: CTAB normal WOB Abdomen: soft, non distended Extremities: warm, dry. 2+ distal pulses   Laboratory:  No results for input(s): WBC, HGB, HCT, PLT in the last 168 hours. Recent Labs  Lab 05/02/20 0219 05/02/20 1520 05/03/20 0325  NA 136 137 138  K 5.4* 4.8 4.9  CL 104 106 106  CO2 20* 20* 20*  BUN 29* 36* 40*  CREATININE 2.40* 2.70* 2.81*  CALCIUM 8.6* 8.5* 8.7*  GLUCOSE 89 161* 111*    Imaging/Diagnostic Tests: None new  Shary Key, DO 05/08/2020, 4:42 PM PGY-1, [place] Intern pager: [phone], text pages welcome

## 2020-05-08 NOTE — Progress Notes (Signed)
Family Medicine Teaching Service Daily Progress Note Intern Pager: [phone]  Patient name: [name] [name] Medical record number: [id number] Date of birth: 1928/10/20 Age: 87 y.o. Gender: female  Primary Care Provider: Merrilee Seashore, MD Consultants: Palliative Code Status: DNR  Pt Overview and Major Events to Date:  3/10: Admitted  3/11: Recommended for SNF 3/12: Cardiology s/o 3/14: Palliative consult 3/15: Medically clear to SNF with hospice 3/16: Peer to peer and SNF denied. Recommended for long term care facility with hospice 3/17: Decision made to switch to comfort care only   Assessment and Plan: [name] [name] a 88 y.o.femalewho was admitted s/p falland found to have decline in kidney function, ultimately transferred to comfort care. PMH is significant for paroxysmal A. fib not on anticoagulation, aortic stenosis, HTN, hyperlipidemia, RA, dementia, depression.Patient is medically clear to discharge toLong-term care facilitywith hospice.  Fall  Overall Decline  Dementia: Stable, now Comfort Care. Is comfortable at this time.  Disposition complicated while Medicaid pending, daughter and patient hopeful for LTC with hospice. -Social work following, appreciate assistance with placement -Fall precautions -OOB with assistance -Continue Zoloft 100 mg daily  Comfort medications:  -Acetaminophen 650 mg PRN mild pain, fever  -Antiseptic oral rinse PRN dry mouth  -Bisacodyl suppository daily PRN moderate constipation  -Benadryl 12.5 mg IV q4h PRN itching  -Glycoyrrolate 1 mg q4h PRN excessive secretions  -Haldol 0.5 mg q4h PRN agitation or delirium   -Ativan 1 mg q4h PRN anxiety   -Zofran 4 mg q6h PRN nausea  -Liquifilm tears 1 drop both eyes QID PRN dry eyes   Chronic conditions, no longer actively treating:  AKI (Baseline wnl)  HFrEF EF40-45%  Severe mitral and tricuspid regurgitation  Aortic stenosis A. Fib, permanent Overactive  bladder Hypertension Hypelipidemia  FEN/GI: Regular diet as tolerated PPx: None since comfort care   Status is: Inpatient  Remains inpatient appropriate because:Unsafe d/c plan   Dispo: The patient is from: Home              Anticipated d/c is to: Awaiting LTAC facility or residential hospice              Patient currently is medically stable to d/c.   Difficult to place patient No  Subjective:  Patient resting comfortably when I entered the room.  Reports she is doing well and has no concerns or complaints at this time.  Objective: Temp:  [98.5 F (36.9 C)] 98.5 F (36.9 C) (03/19 2053) Pulse Rate:  [100] 100 (03/19 2053) Resp:  [16] 16 (03/19 2053) BP: (114)/(82) 114/82 (03/19 2053) SpO2:  [96 %] 96 % (03/19 2053) Physical Exam: General: Chronically ill-appearing, resting comfortably in bed in no acute distress HEENT: NCAT Cardiac: Irregularly irregular rate and rhythm Lungs: Clear bilaterally, no increased WOB  Abdomen: soft  Ext: Warm, dry, 2+ distal radial pulses  Laboratory: No results for input(s): WBC, HGB, HCT, PLT in the last 168 hours. Recent Labs  Lab 05/02/20 0219 05/02/20 1520 05/03/20 0325  NA 136 137 138  K 5.4* 4.8 4.9  CL 104 106 106  CO2 20* 20* 20*  BUN 29* 36* 40*  CREATININE 2.40* 2.70* 2.81*  CALCIUM 8.6* 8.5* 8.7*  GLUCOSE 89 161* 111*   Imaging/Diagnostic Tests: None new.   Gifford Shave, MD 05/08/2020, 5:44 AM PGY-2, [place] Intern pager: [phone], text pages welcome

## 2020-05-09 ENCOUNTER — Ambulatory Visit: Payer: PPO

## 2020-05-09 MED ORDER — POLYETHYLENE GLYCOL 3350 17 G PO PACK
17.0000 g | PACK | Freq: Two times a day (BID) | ORAL | Status: DC
  Administered 2020-05-09 – 2020-05-10 (×4): 17 g via ORAL
  Filled 2020-05-09 (×5): qty 1

## 2020-05-09 NOTE — NC FL2 (Signed)
[place] LEVEL OF CARE SCREENING TOOL     IDENTIFICATION  Patient Name: [name] [name] Birthdate: 04-28-1928 Sex: female Admission Date (Current Location): 04/28/2020  [facility] and [place] Number:  Herbalist and Address:  [facility], [place] [address], [place], [place] [address]      Provider Number: [id number]  Attending Physician Name and Address:  McDiarmid, Blane Ohara, MD  Relative Name and Phone Number:       Current Level of Care: Hospital Recommended Level of Care: Other (Comment) (Long term care) Prior Approval Number:    Date Approved/Denied:   PASRR Number: [id number]  Discharge Plan: SNF    Current Diagnoses: Patient Active Problem List   Diagnosis Date Noted  . Comfort measures only status   . Aortic root dilatation ([place]) 05/02/2020  . Weakness   . Acute kidney injury ([place])   . Decreased urine output   . Goals of care, counseling/discussion   . Palliative care by specialist   . Atrial flutter ([place]) 04/29/2020  . Traumatic Soft tissue injury of hip, left, initial encounter 04/29/2020  . Hypomagnesemia 04/29/2020  . Protein calorie malnutrition ([place]) 04/29/2020  . Frailty syndrome in geriatric patient 04/29/2020  . TSH elevation 04/29/2020  . Dehydration 04/29/2020  . Syncope, possible 04/29/2020  . Hypophosphatemia 04/29/2020  . Fall 04/28/2020  . Dementia ([place]) 01/27/2020  . Pneumonia due to COVID-19 virus 12/23/2019  . Acute hypoxemic respiratory failure ([place]) 12/23/2019  . Hypokalemia with generalized weakness 12/23/2019  . Transaminitis 12/23/2019  . Slow heart rate 03/13/2019  . Mild neurocognitive disorder 02/26/2019  . Clavi 01/20/2019  . Porokeratosis 09/02/2018  . Corns and callosities 09/02/2018  . Elevated CK   . Pressure injury of skin 08/27/2018  . PAF (paroxysmal atrial fibrillation) ([place]) 08/27/2018  . Hypernatremia   . Rhabdomyolysis 08/26/2018  . Normocytic anemia 08/26/2018   . Renal insufficiency 08/26/2018  . Community acquired pneumonia 01/13/2013  . Sepsis, Rule Out 01/13/2013  . Hyponatremia 01/13/2013  . Hypochloremia 01/13/2013  . Rheumatoid arthritis ([place]) 01/13/2013  . Premature atrial contractions 07/22/2012  . Hyperlipidemia 07/22/2012  . Carotid artery bruit 07/22/2012  . Moderate aortic stenosis 07/22/2012  . Chest pain   . Essential hypertension     Orientation RESPIRATION BLADDER Height & Weight     Self,Time,Situation,Place  Normal Continent Weight: 186 lb 4.6 oz (84.5 kg) Height:  5\' 4"  (162.6 cm)  BEHAVIORAL SYMPTOMS/MOOD NEUROLOGICAL BOWEL NUTRITION STATUS      Continent Diet  AMBULATORY STATUS COMMUNICATION OF NEEDS Skin   Extensive Assist Verbally Normal                       Personal Care Assistance Level of Assistance  Bathing,Feeding,Dressing Bathing Assistance: Maximum assistance Feeding assistance: Independent Dressing Assistance: Maximum assistance     Functional Limitations Info  Sight,Hearing,Speech Sight Info: Adequate Hearing Info: Adequate Speech Info: Adequate    SPECIAL CARE FACTORS FREQUENCY  OT (By licensed OT),PT (By licensed PT)     PT Frequency: 5x a week OT Frequency: 5x a week            Contractures Contractures Info: Not present    Additional Factors Info  Code Status,Allergies Code Status Info: DNR Allergies Info: Codeine           Current Medications (05/09/2020):  This is the current hospital active medication list Current Facility-Administered Medications  Medication Dose Route Frequency Provider Last Rate  Last Admin  . acetaminophen (TYLENOL) tablet 650 mg  650 mg Oral Q6H PRN Lilland, Alana, DO       Or  . acetaminophen (TYLENOL) suppository 650 mg  650 mg Rectal Q6H PRN Lilland, Alana, DO      . antiseptic oral rinse (BIOTENE) solution 15 mL  15 mL Topical PRN Cooper, [name] P, PA-C      . bisacodyl (DULCOLAX) suppository 10 mg  10 mg Rectal Daily PRN Cooper,  [name] P, PA-C      . glycopyrrolate (ROBINUL) tablet 1 mg  1 mg Oral Q4H PRN Cooper, [name] P, PA-C       Or  . glycopyrrolate (ROBINUL) injection 0.2 mg  0.2 mg Subcutaneous Q4H PRN Cooper, [name] P, PA-C      . haloperidol (HALDOL) tablet 0.5 mg  0.5 mg Oral Q4H PRN Cooper, [name] P, PA-C       Or  . haloperidol (HALDOL) 2 MG/ML solution 0.5 mg  0.5 mg Sublingual Q4H PRN Cooper, [name] P, PA-C      . LORazepam (ATIVAN) tablet 1 mg  1 mg Oral Q4H PRN Cooper, [name] P, PA-C       Or  . LORazepam (ATIVAN) 2 MG/ML concentrated solution 1 mg  1 mg Sublingual Q4H PRN Cooper, [name] P, PA-C      . ondansetron (ZOFRAN-ODT) disintegrating tablet 4 mg  4 mg Oral Q6H PRN Cooper, [name] P, PA-C   4 mg at 05/05/20 0946  . polyethylene glycol (MIRALAX / GLYCOLAX) packet 17 g  17 g Oral BID Shary Key, DO   17 g at 05/09/20 0941  . polyvinyl alcohol (LIQUIFILM TEARS) 1.4 % ophthalmic solution 1 drop  1 drop Both Eyes QID PRN Cooper, [name] P, PA-C   1 drop at 05/09/20 0941  . sertraline (ZOLOFT) tablet 100 mg  100 mg Oral Daily McDiarmid, Blane Ohara, MD   100 mg at 05/09/20 0762     Discharge Medications: Please see discharge summary for a list of discharge medications.  Relevant Imaging Results:  Relevant Lab Results:   Additional Information SSN [id number], Needs Long term care pending medicaid # [id number], covid vaccinated, receives SSI  [place], LCSWA

## 2020-05-09 NOTE — TOC Progression Note (Addendum)
Transition of Care [facility]) - Progression Note    Patient Details  Name: [name] [name] MRN: [id number] Date of Birth: 09-Apr-1928  Transition of Care [facility]) CM/SW [place], [place] Phone Number: 05/09/2020, 3:23 PM  Clinical Narrative:     CSW spoke to pts daughter Levander Campion about medicaid information. Pts Medicaid case worker is Randalyn Rhea at [phone], pending medicaid number [id number] O. CSW was unable to verify with Ronny Bacon due to not having ROI signed. CSW reached back out to [place] and encouraged her to call [facility]ny Bacon and confirm that pt has applied for Long term Medicaid.   CSW also explained to [facility] that finding a LTC bed can be hard due to staffing shortages at facilities. CSW explained that they may have to look outside of [place]. CSW also explained that pt will have to sign over her SSI check to the facility. [name] stated she would prefer to have her mom close but understands.   CSW re faxed out to facilities in the area for LTC bed.   4:00pm CSW received call from [place]. [facility] confirmed that pt is pending long term medicaid. Ronny Bacon explained that pts application will take a minimum of 45 days and it was started about 15 days ago. Patient will need to be placed at a facility before it will be approved.   Expected Discharge Plan: [place] Barriers to Discharge: Continued Medical Work up  Expected Discharge Plan and Services Expected Discharge Plan: [place] arrangements for the past 2 months: Single Family Home                                       Social Determinants of Health (SDOH) Interventions    Readmission Risk Interventions No flowsheet data found.  Emeterio Reeve, Latanya Presser, [place] Social Worker [phone]

## 2020-05-09 NOTE — Progress Notes (Signed)
Palliative Care Progress Note  Ms. Kijowski is out of the bed in a recliner. She has family at bedside who have come from out-of-town to see her given her serious illness and what continue to be an overall poor prognosis. Goals have been established as comfort care only. She has a particularly great day yesterday- surely energized by all of the support from family and friends visiting. She has been able to ambulate short distances with assistance, but has limited energy reserve. On my visit this afternoon she appears tired, but has no complaints and is not in distress.   Mrs. Stolar has not received any medication except for zoloft and artificial tears based on her [facility] for the past couple of days. All of her cardiac meds were stopped, anticoagulation, and 10+ home meds she had prior to admission and this seems to be serving her very well both on clinical observation and based on normal vital signs.  I suspect that this "good" time for Mrs. Whipple is the result of a comfort focused approach,as well as the excellent care and attention she has been receiving. Given the dramatic nature of her improvement-I suspect this could be a short term rally. I encouraged family at bedside to enjoy this time.It would only take a small change in hemodynamics, a mild infection or dehydration to tip her over into needing a higher level of end-of-life care. Based on her goals and liimited potential for recovery I still do not believe that she would tolerate or be a good candidate for SNF rehab.   The current challenge is to find a place where she can receive custodial care, enjoy her family and friends and have hospice services provide [name] skilled care and meet needs as they develop-and I anticipate her decline at anytime- but there is always uncertainty and medical phenomena that we cannot predict.   Key issue is her current Medicaid Status. Recommend considerations of her current ability to pay for care and health system  UM/Appropriate use Assisitance to secure placement in LTC w/Hospice.  Our team will continue to follow and support. No further palliative care recommendations. Her prognosis remains <6 months (likely much less than this-but at this time cannot support a hospice IPU referral due to no active symptoms and uncertain trajectory, LTC hospice referral appropriate .  Lane Hacker, DO Palliative Medicine  Time: 35 min Greater than 50%  of this time was spent counseling and coordinating care related to the above assessment and plan.

## 2020-05-09 NOTE — Social Work (Signed)
CSW called pts daughter Levander Campion and inquired about medicaid information. [name] stated she has information at home and will call CSW back.   Emeterio Reeve, Latanya Presser, [place] Social Worker [phone]

## 2020-05-10 LAB — SARS CORONAVIRUS 2 (TAT 6-24 HRS): SARS Coronavirus 2: NEGATIVE

## 2020-05-10 NOTE — Progress Notes (Signed)
Family Medicine Teaching Service Daily Progress Note Intern Pager: [phone]  Patient name: [name] [name] Medical record number: [id number] Date of birth: 1928/07/14 Age: 87 y.o. Gender: female  Primary Care Provider: Merrilee Seashore, MD Consultants: Cardiology (s/o), Palliative Code Status: DNR  Pt Overview and Major Events to Date:  3/10: Admitted  3/11: Recommended for SNF 3/12: Cardiology s/o 3/14: Palliative consult 3/15: Medically clear to SNF with hospice 3/16: Peer to peer and SNF denied. Recommended for long term care facility with hospice 3/17: Decision made to switch to comfort care only  Assessment and Plan: [name] [name] a 87 y.o.femalewho was admitted s/p falland found to have decline in kidney function, ultimately transferred to comfort care. PMH is significant for paroxysmal A. fib not on anticoagulation, aortic stenosis, HTN, hyperlipidemia, RA, dementia, depression.Patient is medically clear to discharge toLong-term care facilitywith hospice.  Fall OverallDecline  Dementia: Stable, now Comfort Care. Is comfortable at this time.  Disposition complicated while Medicaid pending, daughter and patient hopeful for LTC with hospice. -Social work following, appreciate assistance with placement -Fall precautions -OOB with assistance -Continue Zoloft 100 mg daily  -Comfort medications:             -Acetaminophen 650 mg PRN mild pain, fever             -Antiseptic oral rinse PRN dry mouth             -Bisacodyl suppository daily PRN moderate constipation             -Benadryl 12.5 mg IV q4h PRN itching             -Glycoyrrolate 1 mg q4h PRN excessive secretions             -Haldol 0.5 mg q4h PRN agitation or delirium              -Ativan 1 mg q4h PRN anxiety              -Zofran 4 mg q6h PRN nausea             -Liquifilm tears 1 drop both eyes QID PRN dry eyes  -Per palliative, recommends considerations of her current ability to pay for care  and health system UM/appropriate use assistance to secure placement in LTC with hospice.  Chronic conditions, no longer actively treating:  AKI (Baseline wnl)  HFrEF EF40-45%  Severe mitral and tricuspid regurgitation  Aortic stenosis A. Fib, permanent Overactive bladder Hypertension Hypelipidemia  FEN/GI: Regular diet as tolerated PPx: None since comfort care   Status is: Inpatient  Remains inpatient appropriate because:Unsafe d/c plan   Dispo: The patient is from: Home  Anticipated d/c is to: Awaiting LTAC facility or residential hospice  Patient currently is medically stable to d/c.              Difficult to place patient No  Subjective:  No acute events overnight. Patient comfortable resting in bed without any complaints of pain   Objective: Temp:  [97.5 F (36.4 C)-97.6 F (36.4 C)] 97.6 F (36.4 C) (03/22 0424) Pulse Rate:  [88-119] 119 (03/22 0424) Resp:  [16] 16 (03/22 0424) BP: (135-138)/(96-97) 138/96 (03/22 0424) SpO2:  [97 %-100 %] 97 % (03/22 0424) Physical Exam: General: chronically ill appearing, NAD Cardiovascular: irregularly irregular RR Respiratory: CTAB. Normal WOB Abdomen: soft, non distended, non tender  Extremities: warm, dry. 2+ LE pitting edema bilaterally   Laboratory: No results for input(s): WBC, HGB, HCT, PLT in the last  168 hours. No results for input(s): NA, K, CL, CO2, BUN, CREATININE, CALCIUM, PROT, BILITOT, ALKPHOS, ALT, AST, GLUCOSE in the last 168 hours.  Invalid input(s): LABALBU   Imaging/Diagnostic Tests: None new  Shary Key, DO 05/10/2020, 7:49 AM PGY-1, [place] Intern pager: [phone], text pages welcome

## 2020-05-10 NOTE — TOC Progression Note (Addendum)
Transition of Care [facility]) - Progression Note    Patient Details  Name: [name] [name] MRN: [id number] Date of Birth: [birth year]  Transition of Care [facility]) CM/SW [place], [place] Phone Number: 05/10/2020, 1:05 PM  Clinical Narrative:     [facility] a L[facility] medicaid bed. Pts daughter Levander Campion and [facility] business office are working out the financial piece and will inform CSW when complete. If everything is resolved today, they could potentially accept her tomorrow. CSW requested covid test fro MD.    2:30pm- [facility] confirmed pt can DC tomorrow. Pt will be going to room 103E-A.   Expected Discharge Plan: [place] Barriers to Discharge: Continued Medical Work up  Expected Discharge Plan and Services Expected Discharge Plan: [place] arrangements for the past 2 months: Single Family Home                                       Social Determinants of Health (SDOH) Interventions    Readmission Risk Interventions No flowsheet data found.  Emeterio Reeve, Latanya Presser, [place] Social Worker [phone]

## 2020-05-11 MED ORDER — POLYETHYLENE GLYCOL 3350 17 G PO PACK: 17.0000 g | PACK | Freq: Two times a day (BID) | ORAL | 0 refills | Status: AC

## 2020-05-11 MED ORDER — ONDANSETRON 4 MG PO TBDP: 4.0000 mg | ORAL_TABLET | Freq: Four times a day (QID) | ORAL | 0 refills | Status: AC | PRN

## 2020-05-11 MED ORDER — GLYCOPYRROLATE 0.2 MG/ML IJ SOLN: 0.2000 mg | INTRAMUSCULAR | Status: AC | PRN

## 2020-05-11 MED ORDER — HALOPERIDOL 0.5 MG PO TABS: 0.5000 mg | ORAL_TABLET | ORAL | Status: AC | PRN

## 2020-05-11 MED ORDER — LORAZEPAM 2 MG/ML PO CONC: 1.0000 mg | ORAL | 0 refills | Status: DC | PRN

## 2020-05-11 MED ORDER — LORAZEPAM 1 MG PO TABS: 1.0000 mg | ORAL_TABLET | ORAL | 0 refills | Status: AC | PRN

## 2020-05-11 MED ORDER — POLYVINYL ALCOHOL 1.4 % OP SOLN: 1.0000 [drp] | Freq: Four times a day (QID) | OPHTHALMIC | 0 refills | Status: AC | PRN

## 2020-05-11 MED ORDER — GLYCOPYRROLATE 1 MG PO TABS: 1.0000 mg | ORAL_TABLET | ORAL | Status: AC | PRN

## 2020-05-11 MED ORDER — BISACODYL 10 MG RE SUPP: 10.0000 mg | Freq: Every day | RECTAL | 0 refills | Status: AC | PRN

## 2020-05-11 MED ORDER — HALOPERIDOL LACTATE 2 MG/ML PO CONC: 0.5000 mg | ORAL | 0 refills | Status: DC | PRN

## 2020-05-11 NOTE — TOC Transition Note (Signed)
Transition of Care [facility]) - CM/SW Discharge Note   Patient Details  Name: [name] [name] MRN: [id number] Date of Birth: February 10, 1929  Transition of [facility]) CM/SW Contact:  Emeterio Reeve, [place] Phone Number: 05/11/2020, 10:09 AM   Clinical Narrative:     Pt will [facility] to [facility] for Long term Care. Pts covid test is negative. CSW notified family in room of DC. [facility]tar transportation has been requested.   Nurse to call report to [phone].   Final next level of care: Long Term Nursing Home Barriers to Discharge: Barriers Resolved   Patient Goals and CMS Choice Patient states their goals for this hospitalization and ongoing recovery are:: Daughter states, "Im not sure yet, I want to talk to pallaitive" CMS Medicare.gov Compare Post Acute Care list provided to:: Patient Choice offered to / list presented to : Adult Children  Discharge Placement              Patient [facility] bed at: [facility] Patient to be transferred to facility by: [facility] Name of family member notified: Family in room Patient and family notified of of transfer: 05/11/20  Discharge Plan and Services                                     Social Determinants of Health ([place]) Interventions     Readmission Risk Interventions No flowsheet data found.  Emeterio Reeve, Latanya Presser, [place] Social Worker [phone]

## 2020-05-11 NOTE — Discharge Instructions (Addendum)
You were admitted after a fall. This is likely due to frailty and your dementia. Throughout the admission your kidney function did get worse. You saw palliative medicine in the hospital. It was decided with your daughter that you will be kept comfort care to maximize your quality of life. You will be going to [facility].  We wish you a comfortable and peaceful time there.  Best wishes,  Family Medicine Team.

## 2020-05-11 NOTE — Care Management Important Message (Signed)
Important Message  Patient Details  Name: [name] [name] MRN: [id number] Date of Birth: [birth year]   Medicare Important Message Given:  Yes  Patient left prior to IM delivery. IM will be mailed to the patient address.    Afton Lavalle 05/11/2020, 2:52 PM

## 2020-05-12 DIAGNOSIS — D509 Iron deficiency anemia, unspecified: Secondary | ICD-10-CM | POA: Diagnosis not present

## 2020-05-12 DIAGNOSIS — R269 Unspecified abnormalities of gait and mobility: Secondary | ICD-10-CM | POA: Diagnosis not present

## 2020-05-12 DIAGNOSIS — N179 Acute kidney failure, unspecified: Secondary | ICD-10-CM | POA: Diagnosis not present

## 2020-05-12 DIAGNOSIS — I350 Nonrheumatic aortic (valve) stenosis: Secondary | ICD-10-CM | POA: Diagnosis not present

## 2020-05-12 DIAGNOSIS — E46 Unspecified protein-calorie malnutrition: Secondary | ICD-10-CM | POA: Diagnosis not present

## 2020-05-12 DIAGNOSIS — F0390 Unspecified dementia without behavioral disturbance: Secondary | ICD-10-CM | POA: Diagnosis not present

## 2020-05-12 DIAGNOSIS — M069 Rheumatoid arthritis, unspecified: Secondary | ICD-10-CM | POA: Diagnosis not present

## 2020-05-12 DIAGNOSIS — I4891 Unspecified atrial fibrillation: Secondary | ICD-10-CM | POA: Diagnosis not present

## 2020-05-12 DIAGNOSIS — R531 Weakness: Secondary | ICD-10-CM | POA: Diagnosis not present

## 2020-05-12 DIAGNOSIS — M25559 Pain in unspecified hip: Secondary | ICD-10-CM | POA: Diagnosis not present

## 2020-05-12 DIAGNOSIS — N183 Chronic kidney disease, stage 3 unspecified: Secondary | ICD-10-CM | POA: Diagnosis not present

## 2020-05-16 ENCOUNTER — Ambulatory Visit: Payer: PPO

## 2020-05-19 DIAGNOSIS — I509 Heart failure, unspecified: Secondary | ICD-10-CM | POA: Diagnosis not present

## 2020-05-19 DIAGNOSIS — F0390 Unspecified dementia without behavioral disturbance: Secondary | ICD-10-CM | POA: Diagnosis not present

## 2020-05-19 DIAGNOSIS — I10 Essential (primary) hypertension: Secondary | ICD-10-CM | POA: Diagnosis not present

## 2020-05-23 ENCOUNTER — Ambulatory Visit: Payer: PPO

## 2020-05-23 DIAGNOSIS — I639 Cerebral infarction, unspecified: Secondary | ICD-10-CM | POA: Diagnosis not present

## 2020-05-25 ENCOUNTER — Encounter: Payer: Self-pay | Admitting: Cardiovascular Disease

## 2020-05-25 ENCOUNTER — Ambulatory Visit (INDEPENDENT_AMBULATORY_CARE_PROVIDER_SITE_OTHER): Payer: PPO | Admitting: Cardiovascular Disease

## 2020-05-25 ENCOUNTER — Other Ambulatory Visit: Payer: Self-pay

## 2020-05-25 DIAGNOSIS — I10 Essential (primary) hypertension: Secondary | ICD-10-CM | POA: Diagnosis not present

## 2020-05-25 DIAGNOSIS — I5043 Acute on chronic combined systolic (congestive) and diastolic (congestive) heart failure: Secondary | ICD-10-CM

## 2020-05-25 DIAGNOSIS — I350 Nonrheumatic aortic (valve) stenosis: Secondary | ICD-10-CM

## 2020-05-25 DIAGNOSIS — E782 Mixed hyperlipidemia: Secondary | ICD-10-CM | POA: Diagnosis not present

## 2020-05-25 DIAGNOSIS — I480 Paroxysmal atrial fibrillation: Secondary | ICD-10-CM

## 2020-05-25 LAB — BASIC METABOLIC PANEL
BUN/Creatinine Ratio: 21 (ref 12–28)
BUN: 51 mg/dL — ABNORMAL HIGH (ref 8–27)
CO2: 20 mmol/L (ref 20–29)
Calcium: 9.3 mg/dL (ref 8.7–10.3)
Chloride: 101 mmol/L (ref 96–106)
Creatinine, Ser: 2.48 mg/dL — ABNORMAL HIGH (ref 0.57–1.00)
Glucose: 107 mg/dL — ABNORMAL HIGH (ref 65–99)
Potassium: 4.8 mmol/L (ref 3.5–5.2)
Sodium: 140 mmol/L (ref 134–144)
eGFR: 18 mL/min/{1.73_m2} — ABNORMAL LOW (ref >59)

## 2020-05-25 MED ORDER — METOPROLOL SUCCINATE ER 25 MG PO TB24: 12.5000 mg | ORAL_TABLET | Freq: Every day | ORAL | 3 refills | Status: AC

## 2020-05-25 MED ORDER — FUROSEMIDE 40 MG PO TABS: 40.0000 mg | ORAL_TABLET | Freq: Every day | ORAL | 3 refills | Status: AC

## 2020-05-25 NOTE — Assessment & Plan Note (Signed)
History of essential hypertension currently not on any medications.  Blood pressure today is 134/86.

## 2020-05-25 NOTE — Assessment & Plan Note (Signed)
History of chronic A. fib today with a heart rate in the 100 range not a candidate to be on oral anticoagulation given her dementia and fall risk.

## 2020-05-25 NOTE — Assessment & Plan Note (Signed)
2D echo performed 04/29/2020 revealed EF in the 40 to 45% range with severe MR, moderate to severe AS and severe TR.  She she is not on a diuretic at this time although when I saw her last year she was on hydrochlorothiazide.  Her serum creatinine has increased up to the high 2 range.  She has progressive lower extremity edema.  She is mostly wheelchair-bound and walks with assistance and a rehab facility.  She is on palliative care.  I am going to begin her on a low-dose diuretic and will check lab work today and again in 2 weeks.

## 2020-05-25 NOTE — Patient Instructions (Signed)
Medication Instructions:   -Start furosemide (lasix) 40mg  once daily .  -Start metoprolol succinate (toprolol xl) 12.5mg  once daily.  *If you need a refill on your cardiac medications before your next appointment, please call your pharmacy*   Lab Work: Your physician recommends that you have labs drawn today: BMET  Your physician recommends that you return for lab work in: in 1-2 weeks BMET   If you have labs (blood work) drawn today and your tests are completely normal, you will receive your results only by: Marland Kitchen MyChart Message (if you have MyChart) OR . A paper copy in the mail If you have any lab test that is abnormal or we need to change your treatment, we will call you to review the results.   Follow-Up: At [facility], you and your health needs are our priority.  As part of our continuing mission to provide you with exceptional heart care, we have created designated Provider Care Teams.  These Care Teams include your primary Cardiologist (physician) and Advanced Practice Providers (APPs -  Physician Assistants and Nurse Practitioners) who all work together to provide you with the care you need, when you need it.  We recommend signing up for the patient portal called "MyChart".  Sign up information is provided on this After Visit Summary.  MyChart is used to connect with patients for Virtual Visits (Telemedicine).  Patients are able to view lab/test results, encounter notes, upcoming appointments, etc.  Non-urgent messages can be sent to your provider as well.   To learn more about what you can do with MyChart, go to [email or web address].    Your next appointment:   1 month(s) and 6 months  The format for your next appointment:   In Person  Provider:   You will see one of the following Advanced Practice Providers on your designated Care Team:    Sande Rives, PA-C  Coletta Memos, FNP  Then, Quay Burow, MD will plan to see you again in 12 month(s).

## 2020-05-25 NOTE — Progress Notes (Signed)
05/25/2020 [name] [name]   22-Jul-1928  [id number]  Primary Physician Merrilee Seashore, MD Primary Cardiologist: Lorretta Harp MD Lupe Carney, [place]  HPI:  [name] [name] is a 87 y.o.  moderately overweight divorced African-American female mother of 81, grandmother of 51 grandchildren is accompanied by her daughter [name] [name] today. She was referred by the hospitalist during her recent hospitalization for an episode of PAF. She has seen Dr. Sallyanne Kuster remotely back in 2014.   I last saw her in the office 03/13/2019.  She has a history of treated hypertension, and hyperlipidemia as well as moderate aortic stenosis which has not progressed in the last 6 years by recent 2D echo. She apparently fell in her bathroom at home and was on the ground for 6 days before coming to the hospital. She was dehydrated and was in rhabdomyolysis. She had a brief episode of PAF and was found on telemetry but otherwise had sinus rhythm with PACs  Since I saw her in the office over a year ago she was recently hospitalized last month after a fall.  She was found to be in A. fib with RVR.  She was also volume overloaded.  Her serum creatinine rose to the high 2 range.  She is currently on palliative care and is living at [facility] rehab facility.  She walks rarely and with assistance.  2D echo performed 04/29/2020 revealed EF of 40 to 45% with severe MR, TR and aortic stenosis.   Current Meds  Medication Sig  . bisacodyl (DULCOLAX) 10 MG suppository Place 1 suppository (10 mg total) rectally daily as needed for moderate constipation.  Marland Kitchen glycopyrrolate (ROBINUL) 0.2 MG/ML injection Inject 1 mL (0.2 mg total) into the skin every 4 (four) hours as needed (excessive secretions).  Marland Kitchen glycopyrrolate (ROBINUL) 1 MG tablet Take 1 tablet (1 mg total) by mouth every 4 (four) hours as needed (excessive secretions).  . haloperidol (HALDOL) 0.5 MG tablet Take 1 tablet (0.5 mg total) by mouth every 4 (four) hours  as needed for agitation (or delirium).  . LORazepam (ATIVAN) 1 MG tablet Take 1 tablet (1 mg total) by mouth every 4 (four) hours as needed for anxiety.  . ondansetron (ZOFRAN-ODT) 4 MG disintegrating tablet Take 1 tablet (4 mg total) by mouth every 6 (six) hours as needed for nausea.  . pantoprazole (PROTONIX) 40 MG tablet Take 1 tablet (40 mg total) by mouth daily.  . polyethylene glycol (MIRALAX / GLYCOLAX) 17 g packet Take 17 g by mouth 2 (two) times daily.  . polyvinyl alcohol (LIQUIFILM TEARS) 1.4 % ophthalmic solution Place 1 drop into both eyes 4 (four) times daily as needed for dry eyes.  Marland Kitchen sertraline (ZOLOFT) 100 MG tablet Take 100 mg by mouth daily.      Allergies  Allergen Reactions  . Codeine Nausea And Vomiting and Other (See Comments)    Dizziness    Social History   Socioeconomic History  . Marital status: Divorced    Spouse name: Not on file  . Number of children: 4  . Years of education: 40  . Highest education level: Some college, no degree  Occupational History  . Not on file  Tobacco Use  . Smoking status: Never Smoker  . Smokeless tobacco: Never Used  Vaping Use  . Vaping Use: Never used  Substance and Sexual Activity  . Alcohol use: No  . Drug use: No  . Sexual activity: Never  Other Topics Concern  .  Not on file  Social History Narrative   Lives at home alone   Right handed   Caffeine: sometimes   Social Determinants of Health   Financial Resource Strain: Not on file  Food Insecurity: Not on file  Transportation Needs: Not on file  Physical Activity: Not on file  Stress: Not on file  Social Connections: Not on file  Intimate Partner Violence: Not on file     Review of Systems: General: negative for chills, fever, night sweats or weight changes.  Cardiovascular: negative for chest pain, dyspnea on exertion, edema, orthopnea, palpitations, paroxysmal nocturnal dyspnea or shortness of breath Dermatological: negative for rash Respiratory:  negative for cough or wheezing Urologic: negative for hematuria Abdominal: negative for nausea, vomiting, diarrhea, bright red blood per rectum, melena, or hematemesis Neurologic: negative for visual changes, syncope, or dizziness All other systems reviewed and are otherwise negative except as noted above.    Blood pressure 134/86, pulse 100, height 5\' 6"  (1.676 m), weight 180 lb (81.6 kg), SpO2 96 %.  General appearance: alert and no distress Neck: no adenopathy, no carotid bruit, no JVD, supple, symmetrical, trachea midline and thyroid not enlarged, symmetric, no tenderness/mass/nodules Lungs: clear to auscultation bilaterally Heart: irregularly irregular rhythm Extremities: 2-3+ pitting edema bilaterally Pulses: 2+ and symmetric Skin: Skin color, texture, turgor normal. No rashes or lesions Neurologic: Alert and oriented X 3, normal strength and tone. Normal symmetric reflexes. Normal coordination and gait  EKG atrial fibrillation with ventricular spots of 100, poor R wave progression.  I personally reviewed this EKG.  ASSESSMENT AND PLAN:   Essential hypertension History of essential hypertension currently not on any medications.  Blood pressure today is 134/86.  Hyperlipidemia History of hyperlipidemia not on statin therapy lipid profile performed 02/25/2020 revealing total cholesterol 126, LDL of 46 and HDL 58.  Moderate aortic stenosis History of moderate to severe aortic stenosis with 2D echo performed 04/29/2020 revealing moderate decline in LV function with an EF in the 40 to 45% range and an aortic valve area of 0.5 cm with a peak gradient of 29 mmHg consistent with low output put/low flow aortic stenosis.  Given the patient's advanced dementia and other comorbidities she is not a candidate for aggressive care.  PAF (paroxysmal atrial fibrillation) (HCC) History of chronic A. fib today with a heart rate in the 100 range not a candidate to be on oral anticoagulation given her  dementia and fall risk.  Acute on chronic combined systolic and diastolic CHF (congestive heart failure) ([place]) 2D echo performed 04/29/2020 revealed EF in the 40 to 45% range with severe MR, moderate to severe AS and severe TR.  She she is not on a diuretic at this time although when I saw her last year she was on hydrochlorothiazide.  Her serum creatinine has increased up to the high 2 range.  She has progressive lower extremity edema.  She is mostly wheelchair-bound and walks with assistance and a rehab facility.  She is on palliative care.  I am going to begin her on a low-dose diuretic and will check lab work today and again in 2 weeks.      Lorretta Harp MD FACP,FACC,FAHA, [facility] 05/25/2020 10:27 AM

## 2020-05-25 NOTE — Assessment & Plan Note (Signed)
History of moderate to severe aortic stenosis with 2D echo performed 04/29/2020 revealing moderate decline in LV function with an EF in the 40 to 45% range and an aortic valve area of 0.5 cm with a peak gradient of 29 mmHg consistent with low output put/low flow aortic stenosis.  Given the patient's advanced dementia and other comorbidities she is not a candidate for aggressive care.

## 2020-05-25 NOTE — Assessment & Plan Note (Signed)
History of hyperlipidemia not on statin therapy lipid profile performed 02/25/2020 revealing total cholesterol 126, LDL of 46 and HDL 58.

## 2020-06-02 DIAGNOSIS — F0390 Unspecified dementia without behavioral disturbance: Secondary | ICD-10-CM | POA: Diagnosis not present

## 2020-06-02 DIAGNOSIS — I4891 Unspecified atrial fibrillation: Secondary | ICD-10-CM | POA: Diagnosis not present

## 2020-06-02 DIAGNOSIS — M069 Rheumatoid arthritis, unspecified: Secondary | ICD-10-CM | POA: Diagnosis not present

## 2020-06-02 DIAGNOSIS — I509 Heart failure, unspecified: Secondary | ICD-10-CM | POA: Diagnosis not present

## 2020-06-02 DIAGNOSIS — D509 Iron deficiency anemia, unspecified: Secondary | ICD-10-CM | POA: Diagnosis not present

## 2020-06-02 DIAGNOSIS — E46 Unspecified protein-calorie malnutrition: Secondary | ICD-10-CM | POA: Diagnosis not present

## 2020-06-02 DIAGNOSIS — R269 Unspecified abnormalities of gait and mobility: Secondary | ICD-10-CM | POA: Diagnosis not present

## 2020-06-02 DIAGNOSIS — I350 Nonrheumatic aortic (valve) stenosis: Secondary | ICD-10-CM | POA: Diagnosis not present

## 2020-06-02 DIAGNOSIS — R531 Weakness: Secondary | ICD-10-CM | POA: Diagnosis not present

## 2020-06-02 DIAGNOSIS — I10 Essential (primary) hypertension: Secondary | ICD-10-CM | POA: Diagnosis not present

## 2020-06-02 DIAGNOSIS — N183 Chronic kidney disease, stage 3 unspecified: Secondary | ICD-10-CM | POA: Diagnosis not present

## 2020-06-06 ENCOUNTER — Ambulatory Visit: Payer: PPO

## 2020-06-07 ENCOUNTER — Ambulatory Visit: Payer: PPO

## 2020-06-08 DIAGNOSIS — I639 Cerebral infarction, unspecified: Secondary | ICD-10-CM | POA: Diagnosis not present

## 2020-06-08 DIAGNOSIS — I10 Essential (primary) hypertension: Secondary | ICD-10-CM | POA: Diagnosis not present

## 2020-06-08 DIAGNOSIS — Z79899 Other long term (current) drug therapy: Secondary | ICD-10-CM | POA: Diagnosis not present

## 2020-06-12 ENCOUNTER — Emergency Department (HOSPITAL_COMMUNITY): Payer: PPO

## 2020-06-12 ENCOUNTER — Inpatient Hospital Stay (HOSPITAL_COMMUNITY)
Admission: EM | Admit: 2020-06-12 | Discharge: 2020-06-19 | DRG: 291 | Disposition: E | Payer: PPO | Attending: Internal Medicine | Admitting: Internal Medicine

## 2020-06-12 DIAGNOSIS — Z8673 Personal history of transient ischemic attack (TIA), and cerebral infarction without residual deficits: Secondary | ICD-10-CM

## 2020-06-12 DIAGNOSIS — I083 Combined rheumatic disorders of mitral, aortic and tricuspid valves: Secondary | ICD-10-CM | POA: Diagnosis present

## 2020-06-12 DIAGNOSIS — Z993 Dependence on wheelchair: Secondary | ICD-10-CM

## 2020-06-12 DIAGNOSIS — G9341 Metabolic encephalopathy: Secondary | ICD-10-CM | POA: Diagnosis present

## 2020-06-12 DIAGNOSIS — Z9181 History of falling: Secondary | ICD-10-CM

## 2020-06-12 DIAGNOSIS — M069 Rheumatoid arthritis, unspecified: Secondary | ICD-10-CM | POA: Diagnosis present

## 2020-06-12 DIAGNOSIS — I130 Hypertensive heart and chronic kidney disease with heart failure and stage 1 through stage 4 chronic kidney disease, or unspecified chronic kidney disease: Principal | ICD-10-CM | POA: Diagnosis present

## 2020-06-12 DIAGNOSIS — Z66 Do not resuscitate: Secondary | ICD-10-CM | POA: Diagnosis present

## 2020-06-12 DIAGNOSIS — E785 Hyperlipidemia, unspecified: Secondary | ICD-10-CM | POA: Diagnosis present

## 2020-06-12 DIAGNOSIS — I509 Heart failure, unspecified: Secondary | ICD-10-CM

## 2020-06-12 DIAGNOSIS — R778 Other specified abnormalities of plasma proteins: Secondary | ICD-10-CM

## 2020-06-12 DIAGNOSIS — F0390 Unspecified dementia without behavioral disturbance: Secondary | ICD-10-CM | POA: Diagnosis present

## 2020-06-12 DIAGNOSIS — R7989 Other specified abnormal findings of blood chemistry: Secondary | ICD-10-CM

## 2020-06-12 DIAGNOSIS — Z20822 Contact with and (suspected) exposure to covid-19: Secondary | ICD-10-CM | POA: Diagnosis present

## 2020-06-12 DIAGNOSIS — R570 Cardiogenic shock: Secondary | ICD-10-CM | POA: Diagnosis present

## 2020-06-12 DIAGNOSIS — I5084 End stage heart failure: Secondary | ICD-10-CM | POA: Diagnosis present

## 2020-06-12 DIAGNOSIS — Z7401 Bed confinement status: Secondary | ICD-10-CM

## 2020-06-12 DIAGNOSIS — K922 Gastrointestinal hemorrhage, unspecified: Secondary | ICD-10-CM

## 2020-06-12 DIAGNOSIS — I248 Other forms of acute ischemic heart disease: Secondary | ICD-10-CM | POA: Diagnosis present

## 2020-06-12 DIAGNOSIS — K921 Melena: Secondary | ICD-10-CM | POA: Diagnosis present

## 2020-06-12 DIAGNOSIS — E876 Hypokalemia: Secondary | ICD-10-CM | POA: Diagnosis present

## 2020-06-12 DIAGNOSIS — I10 Essential (primary) hypertension: Secondary | ICD-10-CM | POA: Diagnosis present

## 2020-06-12 DIAGNOSIS — I5043 Acute on chronic combined systolic (congestive) and diastolic (congestive) heart failure: Secondary | ICD-10-CM | POA: Diagnosis present

## 2020-06-12 DIAGNOSIS — I4892 Unspecified atrial flutter: Secondary | ICD-10-CM | POA: Diagnosis present

## 2020-06-12 DIAGNOSIS — R1111 Vomiting without nausea: Secondary | ICD-10-CM | POA: Diagnosis not present

## 2020-06-12 DIAGNOSIS — I462 Cardiac arrest due to underlying cardiac condition: Secondary | ICD-10-CM | POA: Diagnosis not present

## 2020-06-12 DIAGNOSIS — L89152 Pressure ulcer of sacral region, stage 2: Secondary | ICD-10-CM | POA: Diagnosis present

## 2020-06-12 DIAGNOSIS — R4189 Other symptoms and signs involving cognitive functions and awareness: Secondary | ICD-10-CM | POA: Diagnosis present

## 2020-06-12 DIAGNOSIS — Z515 Encounter for palliative care: Secondary | ICD-10-CM

## 2020-06-12 DIAGNOSIS — I480 Paroxysmal atrial fibrillation: Secondary | ICD-10-CM | POA: Diagnosis present

## 2020-06-12 DIAGNOSIS — N179 Acute kidney failure, unspecified: Secondary | ICD-10-CM | POA: Diagnosis present

## 2020-06-12 DIAGNOSIS — N184 Chronic kidney disease, stage 4 (severe): Secondary | ICD-10-CM | POA: Diagnosis present

## 2020-06-12 DIAGNOSIS — R54 Age-related physical debility: Secondary | ICD-10-CM | POA: Diagnosis present

## 2020-06-12 DIAGNOSIS — J9601 Acute respiratory failure with hypoxia: Secondary | ICD-10-CM | POA: Diagnosis present

## 2020-06-12 DIAGNOSIS — Z79899 Other long term (current) drug therapy: Secondary | ICD-10-CM

## 2020-06-12 DIAGNOSIS — J90 Pleural effusion, not elsewhere classified: Secondary | ICD-10-CM | POA: Diagnosis not present

## 2020-06-12 DIAGNOSIS — E46 Unspecified protein-calorie malnutrition: Secondary | ICD-10-CM | POA: Diagnosis present

## 2020-06-12 DIAGNOSIS — I2489 Other forms of acute ischemic heart disease: Secondary | ICD-10-CM | POA: Diagnosis present

## 2020-06-12 DIAGNOSIS — R1110 Vomiting, unspecified: Secondary | ICD-10-CM | POA: Diagnosis not present

## 2020-06-12 DIAGNOSIS — U071 COVID-19: Secondary | ICD-10-CM | POA: Diagnosis not present

## 2020-06-12 LAB — CBC WITH DIFFERENTIAL/PLATELET
Abs Immature Granulocytes: 0.06 10*3/uL (ref 0.00–0.07)
Basophils Absolute: 0 10*3/uL (ref 0.0–0.1)
Basophils Relative: 0 %
Eosinophils Absolute: 0 10*3/uL (ref 0.0–0.5)
Eosinophils Relative: 0 %
HCT: 41.5 % (ref 36.0–46.0)
Hemoglobin: 13 g/dL (ref 12.0–15.0)
Immature Granulocytes: 1 %
Lymphocytes Relative: 16 %
Lymphs Abs: 1.4 10*3/uL (ref 0.7–4.0)
MCH: 29.8 pg (ref 26.0–34.0)
MCHC: 31.3 g/dL (ref 30.0–36.0)
MCV: 95.2 fL (ref 80.0–100.0)
Monocytes Absolute: 0.4 10*3/uL (ref 0.1–1.0)
Monocytes Relative: 4 %
Neutro Abs: 7.2 10*3/uL (ref 1.7–7.7)
Neutrophils Relative %: 79 %
Platelets: 112 10*3/uL — ABNORMAL LOW (ref 150–400)
RBC: 4.36 MIL/uL (ref 3.87–5.11)
RDW: 21.5 % — ABNORMAL HIGH (ref 11.5–15.5)
WBC: 9.1 10*3/uL (ref 4.0–10.5)
nRBC: 0.3 % — ABNORMAL HIGH (ref 0.0–0.2)

## 2020-06-12 LAB — I-STAT CHEM 8, ED
BUN: 38 mg/dL — ABNORMAL HIGH (ref 8–23)
Calcium, Ion: 1.01 mmol/L — ABNORMAL LOW (ref 1.15–1.40)
Chloride: 102 mmol/L (ref 98–111)
Creatinine, Ser: 3 mg/dL — ABNORMAL HIGH (ref 0.44–1.00)
Glucose, Bld: 106 mg/dL — ABNORMAL HIGH (ref 70–99)
HCT: 44 % (ref 36.0–46.0)
Hemoglobin: 15 g/dL (ref 12.0–15.0)
Potassium: 3.8 mmol/L (ref 3.5–5.1)
Sodium: 138 mmol/L (ref 135–145)
TCO2: 23 mmol/L (ref 22–32)

## 2020-06-12 LAB — TYPE AND SCREEN
ABO/RH(D): B POS
Antibody Screen: NEGATIVE

## 2020-06-12 LAB — COMPREHENSIVE METABOLIC PANEL
ALT: 22 U/L (ref 0–44)
AST: 45 U/L — ABNORMAL HIGH (ref 15–41)
Albumin: 2.6 g/dL — ABNORMAL LOW (ref 3.5–5.0)
Alkaline Phosphatase: 47 U/L (ref 38–126)
Anion gap: 19 — ABNORMAL HIGH (ref 5–15)
BUN: 35 mg/dL — ABNORMAL HIGH (ref 8–23)
CO2: 19 mmol/L — ABNORMAL LOW (ref 22–32)
Calcium: 8.8 mg/dL — ABNORMAL LOW (ref 8.9–10.3)
Chloride: 100 mmol/L (ref 98–111)
Creatinine, Ser: 3.26 mg/dL — ABNORMAL HIGH (ref 0.44–1.00)
GFR, Estimated: 13 mL/min — ABNORMAL LOW (ref >60)
Glucose, Bld: 112 mg/dL — ABNORMAL HIGH (ref 70–99)
Potassium: 4 mmol/L (ref 3.5–5.1)
Sodium: 138 mmol/L (ref 135–145)
Total Bilirubin: 3.9 mg/dL — ABNORMAL HIGH (ref 0.3–1.2)
Total Protein: 5.7 g/dL — ABNORMAL LOW (ref 6.5–8.1)

## 2020-06-12 LAB — BRAIN NATRIURETIC PEPTIDE: B Natriuretic Peptide: 1382 pg/mL — ABNORMAL HIGH (ref 0.0–100.0)

## 2020-06-12 LAB — TROPONIN I (HIGH SENSITIVITY): Troponin I (High Sensitivity): 297 ng/L (ref <18)

## 2020-06-12 LAB — POC OCCULT BLOOD, ED: Fecal Occult Bld: POSITIVE — AB

## 2020-06-12 LAB — LIPASE, BLOOD: Lipase: 26 U/L (ref 11–51)

## 2020-06-12 MED ORDER — SODIUM CHLORIDE 0.9 % IV BOLUS
500.0000 mL | Freq: Once | INTRAVENOUS | Status: AC
  Administered 2020-06-12: 500 mL via INTRAVENOUS

## 2020-06-12 NOTE — ED Triage Notes (Signed)
Patient family sent patient here after a bout of emesis and possible diarrhea. Patient denies any pain at this time. Patient reports that she thinks that she "ate something bad."

## 2020-06-12 NOTE — ED Provider Notes (Addendum)
[facility] EMERGENCY DEPARTMENT Provider Note   CSN: [id number] Arrival date & time: 06/11/2020  2134     History Chief Complaint  Patient presents with  . Emesis    [name] [name] is a 87 y.o. female hx of dementia, afib not on blood thinner, hypertension, systolic heart failure, who presented with vomiting and blood in the stool.  Patient is from a nursing home.  Patient had about 3 episodes of blood in her diaper.  She also had nonbloody vomiting today.  No abdominal pain.  Patient states that she feels fine.  However her daughter was concerned and called EMS.  Patient lives in [facility].  Patient was noted to be hypotensive and tachycardic.  I confirmed CODE STATUS with daughter and patient is DNR but is agreeable to get blood transfusions.   The history is provided by the patient.       Past Medical History:  Diagnosis Date  . Aortic stenosis   . Atrial fibrillation ([place])   . Carotid artery bruit 07/22/2012  . Chest pain    myoview 04/24/07-mild-mod perfusion defect with mild-mod superimposed ischemiain the mid inferior, apicla inferior, basal inferolateral and mid inferolateral regions  . Community acquired pneumonia 01/13/2013  . Dementia ([place]) 01/27/2020  . Edema   . Gallstone   . Generalized osteoarthritis   . HTN (hypertension)   . Hyperlipidemia   . Hypochloremia 01/13/2013  . Hyponatremia 01/13/2013  . IFG (impaired fasting glucose)   . Mild neurocognitive disorder 02/26/2019  . Nonspecific abnormal electrocardiogram (ECG) (EKG)   . Normocytic anemia 08/26/2018  . Porokeratosis 09/02/2018  . Premature atrial contractions 07/22/2012  . Renal insufficiency 08/26/2018  . Rhabdomyolysis 08/26/2018  . Rheumatoid arthritis ([place]) 01/13/2013  . Vertigo    as late effect of cerebrovascular disease    Patient Active Problem List   Diagnosis Date Noted  . Acute on chronic combined systolic and diastolic CHF (congestive heart failure) ([place])  05/25/2020  . Comfort measures only status   . Aortic root dilatation ([place]) 05/02/2020  . Weakness   . Acute kidney injury ([place])   . Decreased urine output   . Goals of care, counseling/discussion   . Palliative care by specialist   . Atrial flutter ([place]) 04/29/2020  . Traumatic Soft tissue injury of hip, left, initial encounter 04/29/2020  . Hypomagnesemia 04/29/2020  . Protein calorie malnutrition ([place]) 04/29/2020  . Frailty syndrome in geriatric patient 04/29/2020  . TSH elevation 04/29/2020  . Dehydration 04/29/2020  . Syncope, possible 04/29/2020  . Hypophosphatemia 04/29/2020  . Fall 04/28/2020  . Dementia ([place]) 01/27/2020  . Pneumonia due to COVID-19 virus 12/23/2019  . Acute hypoxemic respiratory failure ([place]) 12/23/2019  . Hypokalemia with generalized weakness 12/23/2019  . Transaminitis 12/23/2019  . Slow heart rate 03/13/2019  . Mild neurocognitive disorder 02/26/2019  . [place] 01/20/2019  . Porokeratosis 09/02/2018  . Corns and callosities 09/02/2018  . Elevated CK   . Pressure injury of skin 08/27/2018  . PAF (paroxysmal atrial fibrillation) ([place]) 08/27/2018  . Hypernatremia   . Rhabdomyolysis 08/26/2018  . Normocytic anemia 08/26/2018  . Renal insufficiency 08/26/2018  . Community acquired pneumonia 01/13/2013  . Sepsis, Rule Out 01/13/2013  . Hyponatremia 01/13/2013  . Hypochloremia 01/13/2013  . Rheumatoid arthritis ([place]) 01/13/2013  . Premature atrial contractions 07/22/2012  . Hyperlipidemia 07/22/2012  . Carotid artery bruit 07/22/2012  . Moderate aortic stenosis 07/22/2012  . Chest pain   . Essential hypertension  Past Surgical History:  Procedure Laterality Date  . APPENDECTOMY    . BUNIONECTOMY Bilateral   . cataract surgery Bilateral 05/2016  . gallstones removal    . TONSILLECTOMY       OB History   No obstetric history on file.     Family History  Problem Relation Age of Onset  . Diabetes Father   . Seizures Brother   .  Alzheimer's disease Sister   . Cancer Sister   . Breast cancer Sister   . Cancer Sister   . Seizures Daughter   . Seizures Son   . Seizures Granddaughter   . Ovarian cancer Granddaughter   . Lung cancer Nephew   . Breast cancer Niece   . Breast cancer Niece   . Brain cancer Niece     Social History   Tobacco Use  . Smoking status: Never Smoker  . Smokeless tobacco: Never Used  Vaping Use  . Vaping Use: Never used  Substance Use Topics  . Alcohol use: No  . Drug use: No    Home Medications Prior to Admission medications   Medication Sig Start Date End Date Taking? Authorizing Provider  bisacodyl (DULCOLAX) 10 MG suppository Place 1 suppository (10 mg total) rectally daily as needed for moderate constipation. 05/11/20   Lattie Haw, MD  furosemide (LASIX) 40 MG tablet Take 1 tablet (40 mg total) by mouth daily. 05/25/20 08/23/20  Lorretta Harp, MD  glycopyrrolate (ROBINUL) 0.2 MG/ML injection Inject 1 mL (0.2 mg total) into the skin every 4 (four) hours as needed (excessive secretions). 05/11/20   Lattie Haw, MD  glycopyrrolate (ROBINUL) 1 MG tablet Take 1 tablet (1 mg total) by mouth every 4 (four) hours as needed (excessive secretions). 05/11/20   Lattie Haw, MD  haloperidol (HALDOL) 0.5 MG tablet Take 1 tablet (0.5 mg total) by mouth every 4 (four) hours as needed for agitation (or delirium). 05/11/20   Lattie Haw, MD  LORazepam (ATIVAN) 1 MG tablet Take 1 tablet (1 mg total) by mouth every 4 (four) hours as needed for anxiety. 05/11/20   Lattie Haw, MD  metoprolol succinate (TOPROL XL) 25 MG 24 hr tablet Take 0.5 tablets (12.5 mg total) by mouth daily. 05/25/20   Lorretta Harp, MD  ondansetron (ZOFRAN-ODT) 4 MG disintegrating tablet Take 1 tablet (4 mg total) by mouth every 6 (six) hours as needed for nausea. 05/11/20   Lattie Haw, MD  pantoprazole (PROTONIX) 40 MG tablet Take 1 tablet (40 mg total) by mouth daily. 12/30/19   Elgergawy, Silver Huguenin, MD  polyethylene  glycol (MIRALAX / GLYCOLAX) 17 g packet Take 17 g by mouth 2 (two) times daily. 05/11/20   Lattie Haw, MD  polyvinyl alcohol (LIQUIFILM TEARS) 1.4 % ophthalmic solution Place 1 drop into both eyes 4 (four) times daily as needed for dry eyes. 05/11/20   Lattie Haw, MD  sertraline (ZOLOFT) 100 MG tablet Take 100 mg by mouth daily.  12/04/17   [provider]    Allergies    Codeine  Review of Systems   Review of Systems  Gastrointestinal: Positive for blood in stool and vomiting.  All other systems reviewed and are negative.   Physical Exam Updated Vital Signs BP (!) 89/60 (BP Location: Right Arm)   Pulse (!) 116   Temp (!) 97.5 F (36.4 C) (Oral)   Resp (!) 23   Ht 5\' 7"  (1.702 m)   Wt 85.3 kg   SpO2 93%   BMI  29.44 kg/m   Physical Exam Vitals and nursing note reviewed.  HENT:     Head: Normocephalic.     Mouth/Throat:     Mouth: Mucous membranes are dry.  Eyes:     Comments: Conjunctiva pale   Cardiovascular:     Rate and Rhythm: Normal rate and regular rhythm.     Pulses: Normal pulses.     Heart sounds: Normal heart sounds.  Pulmonary:     Effort: Pulmonary effort is normal.     Breath sounds: Normal breath sounds.  Abdominal:     General: Abdomen is flat.     Palpations: Abdomen is soft.  Genitourinary:    Comments: Rectal-bright red blood  Musculoskeletal:        General: Normal range of motion.     Cervical back: Normal range of motion and neck supple.     Comments: 1+ edema bilateral legs   Skin:    General: Skin is warm.     Capillary Refill: Capillary refill takes less than 2 seconds.  Neurological:     Mental Status: She is alert.     Comments: Demented, slightly confused, moving all extremities   Psychiatric:        Mood and Affect: Mood normal.     ED Results / Procedures / Treatments   Labs (all labs ordered are listed, but only abnormal results are displayed) Labs Reviewed  POC OCCULT BLOOD, ED - Abnormal; Notable for the  following components:      Result Value   Fecal Occult Bld POSITIVE (*)    All other components within normal limits  CBC WITH DIFFERENTIAL/PLATELET  COMPREHENSIVE METABOLIC PANEL  LIPASE, BLOOD  BRAIN NATRIURETIC PEPTIDE  I-STAT CHEM 8, ED  TYPE AND SCREEN  TROPONIN I (HIGH SENSITIVITY)    EKG None  Radiology No results found.  Procedures Procedures   Angiocath insertion Performed by: Wandra Arthurs  Consent: Verbal consent obtained. Risks and benefits: risks, benefits and alternatives were discussed Time out: Immediately prior to procedure a "time out" was called to verify the correct patient, procedure, equipment, support staff and site/side marked as required.  Preparation: Patient was prepped and draped in the usual sterile fashion.  Vein Location: R antecube  Ultrasound Guided  Gauge: 20 long   Normal blood return and flush without difficulty Patient tolerance: Patient tolerated the procedure well with no immediate complications.     Medications Ordered in ED Medications  sodium chloride 0.9 % bolus 500 mL (has no administration in time range)    ED Course  I have reviewed the triage vital signs and the nursing notes.  Pertinent labs & imaging results that were available during my care of the patient were reviewed by me and considered in my medical decision making (see chart for details).    MDM Rules/Calculators/A&P                         Jadelyn Genessa Beman is a 87 y.o. female who presenting with hypotension and possible GI bleed. Patient has a red blood per rectum and an episode of vomiting.  However she has no abdominal pain.  I had extensive discussion with her daughter.  Patient is DNR.  She does not want any aggressive treatment.  She is agreeable to blood transfusions as needed but does not want her to be intubated.  Plan to get CBC and CMP and type and screen and give IV fluids.  Patient  has EF of 40% so we will give 500 cc and  reevaluate.  11:30 PM BP up to 103/84. Occ positive.  Patient's x-ray showed pleural effusion.  Patient's troponin is elevated to 297 likely from demand ischemia.  Given GI bleed we will hold off on any heparin or aspirin.  Patient's white blood cell count is normal and I do not think she has aspiration pneumonia.  Hospitalist to admit. Left message with Dr. Havery Moros from GI.   Final Clinical Impression(s) / ED Diagnoses Final diagnoses:  None    Rx / DC Orders ED Discharge Orders    None       Drenda Freeze, MD 06/14/2020 2332    Drenda Freeze, MD 06/17/2020 [phone]

## 2020-06-13 ENCOUNTER — Observation Stay (HOSPITAL_COMMUNITY): Payer: PPO

## 2020-06-13 ENCOUNTER — Other Ambulatory Visit: Payer: Self-pay

## 2020-06-13 DIAGNOSIS — Z515 Encounter for palliative care: Secondary | ICD-10-CM

## 2020-06-13 DIAGNOSIS — I959 Hypotension, unspecified: Secondary | ICD-10-CM | POA: Diagnosis not present

## 2020-06-13 DIAGNOSIS — I4892 Unspecified atrial flutter: Secondary | ICD-10-CM | POA: Diagnosis not present

## 2020-06-13 DIAGNOSIS — I5084 End stage heart failure: Secondary | ICD-10-CM | POA: Diagnosis not present

## 2020-06-13 DIAGNOSIS — F0390 Unspecified dementia without behavioral disturbance: Secondary | ICD-10-CM | POA: Diagnosis not present

## 2020-06-13 DIAGNOSIS — Z20822 Contact with and (suspected) exposure to covid-19: Secondary | ICD-10-CM | POA: Diagnosis not present

## 2020-06-13 DIAGNOSIS — M069 Rheumatoid arthritis, unspecified: Secondary | ICD-10-CM | POA: Diagnosis not present

## 2020-06-13 DIAGNOSIS — G9341 Metabolic encephalopathy: Secondary | ICD-10-CM | POA: Diagnosis not present

## 2020-06-13 DIAGNOSIS — R4189 Other symptoms and signs involving cognitive functions and awareness: Secondary | ICD-10-CM | POA: Diagnosis not present

## 2020-06-13 DIAGNOSIS — Z789 Other specified health status: Secondary | ICD-10-CM | POA: Diagnosis not present

## 2020-06-13 DIAGNOSIS — Z8673 Personal history of transient ischemic attack (TIA), and cerebral infarction without residual deficits: Secondary | ICD-10-CM | POA: Diagnosis not present

## 2020-06-13 DIAGNOSIS — E785 Hyperlipidemia, unspecified: Secondary | ICD-10-CM | POA: Diagnosis not present

## 2020-06-13 DIAGNOSIS — N179 Acute kidney failure, unspecified: Secondary | ICD-10-CM | POA: Diagnosis not present

## 2020-06-13 DIAGNOSIS — I10 Essential (primary) hypertension: Secondary | ICD-10-CM

## 2020-06-13 DIAGNOSIS — I248 Other forms of acute ischemic heart disease: Secondary | ICD-10-CM | POA: Diagnosis not present

## 2020-06-13 DIAGNOSIS — R778 Other specified abnormalities of plasma proteins: Secondary | ICD-10-CM

## 2020-06-13 DIAGNOSIS — E7800 Pure hypercholesterolemia, unspecified: Secondary | ICD-10-CM | POA: Diagnosis not present

## 2020-06-13 DIAGNOSIS — I5043 Acute on chronic combined systolic (congestive) and diastolic (congestive) heart failure: Secondary | ICD-10-CM | POA: Diagnosis not present

## 2020-06-13 DIAGNOSIS — I509 Heart failure, unspecified: Secondary | ICD-10-CM

## 2020-06-13 DIAGNOSIS — E46 Unspecified protein-calorie malnutrition: Secondary | ICD-10-CM | POA: Diagnosis not present

## 2020-06-13 DIAGNOSIS — Z66 Do not resuscitate: Secondary | ICD-10-CM

## 2020-06-13 DIAGNOSIS — K921 Melena: Secondary | ICD-10-CM | POA: Diagnosis not present

## 2020-06-13 DIAGNOSIS — F1027 Alcohol dependence with alcohol-induced persisting dementia: Secondary | ICD-10-CM

## 2020-06-13 DIAGNOSIS — R570 Cardiogenic shock: Secondary | ICD-10-CM | POA: Diagnosis not present

## 2020-06-13 DIAGNOSIS — I130 Hypertensive heart and chronic kidney disease with heart failure and stage 1 through stage 4 chronic kidney disease, or unspecified chronic kidney disease: Secondary | ICD-10-CM | POA: Diagnosis not present

## 2020-06-13 DIAGNOSIS — K922 Gastrointestinal hemorrhage, unspecified: Secondary | ICD-10-CM

## 2020-06-13 DIAGNOSIS — I5021 Acute systolic (congestive) heart failure: Secondary | ICD-10-CM

## 2020-06-13 DIAGNOSIS — R451 Restlessness and agitation: Secondary | ICD-10-CM | POA: Diagnosis not present

## 2020-06-13 DIAGNOSIS — N184 Chronic kidney disease, stage 4 (severe): Secondary | ICD-10-CM | POA: Diagnosis not present

## 2020-06-13 DIAGNOSIS — J9601 Acute respiratory failure with hypoxia: Secondary | ICD-10-CM | POA: Diagnosis not present

## 2020-06-13 DIAGNOSIS — Z79899 Other long term (current) drug therapy: Secondary | ICD-10-CM | POA: Diagnosis not present

## 2020-06-13 DIAGNOSIS — Z9181 History of falling: Secondary | ICD-10-CM | POA: Diagnosis not present

## 2020-06-13 DIAGNOSIS — R7989 Other specified abnormal findings of blood chemistry: Secondary | ICD-10-CM

## 2020-06-13 DIAGNOSIS — Z7189 Other specified counseling: Secondary | ICD-10-CM

## 2020-06-13 DIAGNOSIS — I462 Cardiac arrest due to underlying cardiac condition: Secondary | ICD-10-CM | POA: Diagnosis not present

## 2020-06-13 DIAGNOSIS — I083 Combined rheumatic disorders of mitral, aortic and tricuspid valves: Secondary | ICD-10-CM | POA: Diagnosis not present

## 2020-06-13 LAB — RESP PANEL BY RT-PCR (FLU A&B, COVID) ARPGX2
Influenza A by PCR: NEGATIVE
Influenza B by PCR: NEGATIVE
SARS Coronavirus 2 by RT PCR: NEGATIVE

## 2020-06-13 LAB — COMPREHENSIVE METABOLIC PANEL
ALT: 21 U/L (ref 0–44)
AST: 45 U/L — ABNORMAL HIGH (ref 15–41)
Albumin: 2.4 g/dL — ABNORMAL LOW (ref 3.5–5.0)
Alkaline Phosphatase: 45 U/L (ref 38–126)
Anion gap: 18 — ABNORMAL HIGH (ref 5–15)
BUN: 35 mg/dL — ABNORMAL HIGH (ref 8–23)
CO2: 19 mmol/L — ABNORMAL LOW (ref 22–32)
Calcium: 8.6 mg/dL — ABNORMAL LOW (ref 8.9–10.3)
Chloride: 102 mmol/L (ref 98–111)
Creatinine, Ser: 3.32 mg/dL — ABNORMAL HIGH (ref 0.44–1.00)
GFR, Estimated: 13 mL/min — ABNORMAL LOW (ref >60)
Glucose, Bld: 111 mg/dL — ABNORMAL HIGH (ref 70–99)
Potassium: 4.1 mmol/L (ref 3.5–5.1)
Sodium: 139 mmol/L (ref 135–145)
Total Bilirubin: 3.5 mg/dL — ABNORMAL HIGH (ref 0.3–1.2)
Total Protein: 5.3 g/dL — ABNORMAL LOW (ref 6.5–8.1)

## 2020-06-13 LAB — CBC
HCT: 38.8 % (ref 36.0–46.0)
Hemoglobin: 12.2 g/dL (ref 12.0–15.0)
MCH: 29.8 pg (ref 26.0–34.0)
MCHC: 31.4 g/dL (ref 30.0–36.0)
MCV: 94.6 fL (ref 80.0–100.0)
Platelets: 107 10*3/uL — ABNORMAL LOW (ref 150–400)
RBC: 4.1 MIL/uL (ref 3.87–5.11)
RDW: 21.4 % — ABNORMAL HIGH (ref 11.5–15.5)
WBC: 8.8 10*3/uL (ref 4.0–10.5)
nRBC: 0.2 % (ref 0.0–0.2)

## 2020-06-13 LAB — ECHOCARDIOGRAM COMPLETE
AR max vel: 0.62 cm2
AV Area VTI: 0.67 cm2
AV Area mean vel: 0.62 cm2
AV Mean grad: 17.7 mmHg
AV Peak grad: 27.9 mmHg
Ao pk vel: 2.64 m/s
Area-P 1/2: 5.84 cm2
Height: 66 in
MV M vel: 4.68 m/s
MV Peak grad: 87.4 mmHg
Radius: 0.6 cm
S' Lateral: 3.4 cm
Weight: 2818.36 oz

## 2020-06-13 LAB — ABO/RH: ABO/RH(D): B POS

## 2020-06-13 LAB — MAGNESIUM: Magnesium: 1.9 mg/dL (ref 1.7–2.4)

## 2020-06-13 LAB — TROPONIN I (HIGH SENSITIVITY)
Troponin I (High Sensitivity): 320 ng/L (ref <18)
Troponin I (High Sensitivity): 765 ng/L (ref <18)

## 2020-06-13 MED ORDER — FENTANYL CITRATE (PF) 100 MCG/2ML IJ SOLN
25.0000 ug | INTRAMUSCULAR | Status: DC | PRN
  Administered 2020-06-14: 25 ug via INTRAVENOUS
  Filled 2020-06-13 (×2): qty 2

## 2020-06-13 MED ORDER — LORAZEPAM 2 MG/ML IJ SOLN
1.0000 mg | INTRAMUSCULAR | Status: DC | PRN
  Administered 2020-06-13 – 2020-06-15 (×4): 1 mg via INTRAVENOUS
  Filled 2020-06-13 (×4): qty 1

## 2020-06-13 MED ORDER — PANTOPRAZOLE SODIUM 40 MG PO TBEC
40.0000 mg | DELAYED_RELEASE_TABLET | Freq: Every day | ORAL | Status: DC
  Administered 2020-06-13: 40 mg via ORAL
  Filled 2020-06-13: qty 1

## 2020-06-13 MED ORDER — SODIUM CHLORIDE 0.9% FLUSH
3.0000 mL | Freq: Two times a day (BID) | INTRAVENOUS | Status: DC
  Administered 2020-06-13 – 2020-06-15 (×7): 3 mL via INTRAVENOUS

## 2020-06-13 MED ORDER — SODIUM CHLORIDE 0.9 % IV SOLN: 250.0000 mL | INTRAVENOUS | Status: DC | PRN

## 2020-06-13 MED ORDER — METOPROLOL SUCCINATE ER 25 MG PO TB24: 12.5000 mg | ORAL_TABLET | Freq: Every day | ORAL | Status: DC

## 2020-06-13 MED ORDER — ONDANSETRON 4 MG PO TBDP
4.0000 mg | ORAL_TABLET | Freq: Four times a day (QID) | ORAL | Status: DC | PRN
  Filled 2020-06-13: qty 1

## 2020-06-13 MED ORDER — HALOPERIDOL LACTATE 2 MG/ML PO CONC
2.0000 mg | Freq: Four times a day (QID) | ORAL | Status: DC | PRN
  Filled 2020-06-13: qty 1

## 2020-06-13 MED ORDER — SODIUM CHLORIDE 0.9% FLUSH
3.0000 mL | Freq: Two times a day (BID) | INTRAVENOUS | Status: DC
  Administered 2020-06-13: 3 mL via INTRAVENOUS

## 2020-06-13 MED ORDER — LORAZEPAM 1 MG PO TABS: 1.0000 mg | ORAL_TABLET | ORAL | Status: DC | PRN

## 2020-06-13 MED ORDER — SODIUM CHLORIDE 0.9% FLUSH
3.0000 mL | INTRAVENOUS | Status: DC | PRN
Start: 2020-06-13 — End: 2020-06-16

## 2020-06-13 MED ORDER — GLYCOPYRROLATE 0.2 MG/ML IJ SOLN: 0.2000 mg | INTRAMUSCULAR | Status: DC | PRN

## 2020-06-13 MED ORDER — POLYVINYL ALCOHOL 1.4 % OP SOLN: 1.0000 [drp] | Freq: Four times a day (QID) | OPHTHALMIC | Status: DC | PRN

## 2020-06-13 MED ORDER — GLYCOPYRROLATE 1 MG PO TABS
1.0000 mg | ORAL_TABLET | ORAL | Status: DC | PRN
  Filled 2020-06-13: qty 1

## 2020-06-13 MED ORDER — HALOPERIDOL LACTATE 5 MG/ML IJ SOLN: 2.0000 mg | Freq: Four times a day (QID) | INTRAMUSCULAR | Status: DC | PRN

## 2020-06-13 MED ORDER — BIOTENE DRY MOUTH MT LIQD
15.0000 mL | Freq: Two times a day (BID) | OROMUCOSAL | Status: DC
  Administered 2020-06-13: 15 mL via TOPICAL

## 2020-06-13 MED ORDER — LORAZEPAM 2 MG/ML PO CONC: 1.0000 mg | ORAL | Status: DC | PRN

## 2020-06-13 MED ORDER — ONDANSETRON HCL 4 MG/2ML IJ SOLN: 4.0000 mg | Freq: Four times a day (QID) | INTRAMUSCULAR | Status: DC | PRN

## 2020-06-13 MED ORDER — ACETAMINOPHEN 325 MG PO TABS: 650.0000 mg | ORAL_TABLET | Freq: Four times a day (QID) | ORAL | Status: DC | PRN

## 2020-06-13 MED ORDER — DIPHENHYDRAMINE HCL 50 MG/ML IJ SOLN: 25.0000 mg | Freq: Four times a day (QID) | INTRAMUSCULAR | Status: DC | PRN

## 2020-06-13 MED ORDER — BISACODYL 10 MG RE SUPP: 10.0000 mg | Freq: Every day | RECTAL | Status: DC | PRN

## 2020-06-13 MED ORDER — ACETAMINOPHEN 650 MG RE SUPP
650.0000 mg | Freq: Four times a day (QID) | RECTAL | Status: DC | PRN
Start: 2020-06-13 — End: 2020-06-16

## 2020-06-13 MED ORDER — HALOPERIDOL 1 MG PO TABS
2.0000 mg | ORAL_TABLET | Freq: Four times a day (QID) | ORAL | Status: DC | PRN
  Filled 2020-06-13: qty 2

## 2020-06-13 NOTE — Consult Note (Addendum)
Cardiology Consultation:   Patient ID: [name] [name] MRN: [id number]; DOB: 05-08-1928  Admit date: 06/11/2020 Date of Consult: 06/13/2020  PCP:  Merrilee Seashore, [place]  Cardiologist:  Quay Burow, MD  Advanced Practice Provider:  No care team member to display Electrophysiologist:  None   Click here to update Patient Care Team and Refresh Note - MD (PCP) or APP (Team Member)  Change PCP Type for MD, Specialty for APP is either Cardiology or Clinical Cardiac Electrophysiology  :[id number]}    Patient Profile:   [name] [name] is a 87 y.o. female with a hx of a flutter, A. Fib- not on AC, hypertension, HFrEF, 40-45%,  severe mitral regurgitation and severe aortic stenosis (LFLG AS, hyperlipidemia, malnutrition, rheumatoid arthritis, CKD 3who is being seen today for the evaluation of HFrEF, Severe AS, Hypotension at the request of Dr Sheppard Coil.  History of Present Illness:   [name] [name] is a 87 y.o. female with medical history significant of dementia, a flutter, A. fib, hypertension, HFrEF, 40-45%,  severe mitral regurgitation and severe aortic stenosis (LFLG AS, hyperlipidemia, malnutrition, rheumatoid arthritis, CKD 3 who presents with nausea, vomiting, bloody stools.   History was obtained from the daughter at bedside.  The patient lives in a nursing home- has advanced dementia- getting worse, declining in function, able to do some ADLS but mostly bed bound, wheel chair bound. Daughter was at the NH last week noticed blood in the groins and today while giving her bath- there was a lot of blood in her groins and buttocks. She is not on any blood thinners- given this concern and also patient had emesis- she was brought to the ER. The patient denies any chest pain or SOB but she gets hypoxic and is requiring oxygen.  ER course- AKI Cr is up, FOBT positive, lipase negative, BNP elevated at 1382 , trop elevated 297->320 Hb  12.2 ekg shows aflutter RVR, HR 117 CXR- shows small pleural effusion and pulmonary edema  Past Medical History:  Diagnosis Date  . Aortic stenosis   . Atrial fibrillation ([place])   . Carotid artery bruit 07/22/2012  . Chest pain    myoview 04/24/07-mild-mod perfusion defect with mild-mod superimposed ischemiain the mid inferior, apicla inferior, basal inferolateral and mid inferolateral regions  . Community acquired pneumonia 01/13/2013  . Dementia ([place]) 01/27/2020  . Edema   . Gallstone   . Generalized osteoarthritis   . HTN (hypertension)   . Hyperlipidemia   . Hypochloremia 01/13/2013  . Hyponatremia 01/13/2013  . IFG (impaired fasting glucose)   . Mild neurocognitive disorder 02/26/2019  . Nonspecific abnormal electrocardiogram (ECG) (EKG)   . Normocytic anemia 08/26/2018  . Porokeratosis 09/02/2018  . Premature atrial contractions 07/22/2012  . Renal insufficiency 08/26/2018  . Rhabdomyolysis 08/26/2018  . Rheumatoid arthritis ([place]) 01/13/2013  . Vertigo    as late effect of cerebrovascular disease    Past Surgical History:  Procedure Laterality Date  . APPENDECTOMY    . BUNIONECTOMY Bilateral   . cataract surgery Bilateral 05/2016  . gallstones removal    . TONSILLECTOMY       Home Medications:  Prior to Admission medications   Medication Sig Start Date End Date Taking? Authorizing Provider  bisacodyl (DULCOLAX) 10 MG suppository Place 1 suppository (10 mg total) rectally daily as needed for moderate constipation. 05/11/20   Lattie Haw, MD  furosemide (LASIX) 40 MG tablet Take 1 tablet (40 mg total) by mouth daily. 05/25/20 08/23/20  Lorretta Harp, MD  glycopyrrolate (ROBINUL) 0.2 MG/ML injection Inject 1 mL (0.2 mg total) into the skin every 4 (four) hours as needed (excessive secretions). 05/11/20   Lattie Haw, MD  glycopyrrolate (ROBINUL) 1 MG tablet Take 1 tablet (1 mg total) by mouth every 4 (four) hours as needed (excessive secretions). 05/11/20   Lattie Haw, MD   haloperidol (HALDOL) 0.5 MG tablet Take 1 tablet (0.5 mg total) by mouth every 4 (four) hours as needed for agitation (or delirium). 05/11/20   Lattie Haw, MD  LORazepam (ATIVAN) 1 MG tablet Take 1 tablet (1 mg total) by mouth every 4 (four) hours as needed for anxiety. 05/11/20   Lattie Haw, MD  metoprolol succinate (TOPROL XL) 25 MG 24 hr tablet Take 0.5 tablets (12.5 mg total) by mouth daily. 05/25/20   Lorretta Harp, MD  ondansetron (ZOFRAN-ODT) 4 MG disintegrating tablet Take 1 tablet (4 mg total) by mouth every 6 (six) hours as needed for nausea. 05/11/20   Lattie Haw, MD  pantoprazole (PROTONIX) 40 MG tablet Take 1 tablet (40 mg total) by mouth daily. 12/30/19   Elgergawy, Silver Huguenin, MD  polyethylene glycol (MIRALAX / GLYCOLAX) 17 g packet Take 17 g by mouth 2 (two) times daily. 05/11/20   Lattie Haw, MD  polyvinyl alcohol (LIQUIFILM TEARS) 1.4 % ophthalmic solution Place 1 drop into both eyes 4 (four) times daily as needed for dry eyes. 05/11/20   Lattie Haw, MD  sertraline (ZOLOFT) 100 MG tablet Take 100 mg by mouth daily.  12/04/17   [provider]    Inpatient Medications: Scheduled Meds: . pantoprazole  40 mg Oral Daily  . sodium chloride flush  3 mL Intravenous Q12H  . sodium chloride flush  3 mL Intravenous Q12H   Continuous Infusions: . sodium chloride     PRN Meds: sodium chloride, acetaminophen **OR** acetaminophen, bisacodyl, glycopyrrolate, ondansetron, polyvinyl alcohol, sodium chloride flush  Allergies:    Allergies  Allergen Reactions  . Codeine Nausea And Vomiting and Other (See Comments)    Dizziness    Social History:   Social History   Socioeconomic History  . Marital status: Divorced    Spouse name: Not on file  . Number of children: 4  . Years of education: 32  . Highest education level: Some college, no degree  Occupational History  . Not on file  Tobacco Use  . Smoking status: Never Smoker  . Smokeless tobacco: Never Used   Vaping Use  . Vaping Use: Never used  Substance and Sexual Activity  . Alcohol use: No  . Drug use: No  . Sexual activity: Never  Other Topics Concern  . Not on file  Social History Narrative   Lives at home alone   Right handed   Caffeine: sometimes   Social Determinants of Health   Financial Resource Strain: Not on file  Food Insecurity: Not on file  Transportation Needs: Not on file  Physical Activity: Not on file  Stress: Not on file  Social Connections: Not on file  Intimate Partner Violence: Not on file    Family History:    Family History  Problem Relation Age of Onset  . Diabetes Father   . Seizures Brother   . Alzheimer's disease Sister   . Cancer Sister   . Breast cancer Sister   . Cancer Sister   . Seizures Daughter   . Seizures Son   . Seizures Granddaughter   . Ovarian cancer Granddaughter   .  Lung cancer Nephew   . Breast cancer Niece   . Breast cancer Niece   . Brain cancer Niece      ROS:  Please see the history of present illness.   All other ROS reviewed and negative.     Physical Exam/Data:   Vitals:   06/04/2020 2138 06/08/2020 2139 05/30/2020 2230 06/04/2020 2345  BP: (!) 89/60  103/84 (!) 89/72  Pulse: (!) 116  (!) 45 (!) 101  Resp: (!) 23  (!) 24 (!) 22  Temp: (!) 97.5 F (36.4 C)     TempSrc: Oral     SpO2: 93%  97% (!) 88%  Weight:  85.3 kg    Height:  5\' 7"  (1.702 m)     No intake or output data in the 24 hours ending 06/13/20 0117 Last 3 Weights 06/14/2020 05/25/2020 05/04/2020  Weight (lbs) 188 lb 180 lb 186 lb 4.6 oz  Weight (kg) 85.276 kg 81.647 kg 84.5 kg     Body mass index is 29.44 kg/m.  General:  Thin, frial lady HEENT: normal Lymph: no adenopathy Neck:  JVD+ Endocrine:  No thryomegaly Vascular: No carotid bruits; FA pulses 2+ bilaterally without bruits  Cardiac:  Irregular rhtyhm and ejection systolic murmur- soft at RUSB and holosystolic at apex Lungs:  crackles Abd: soft, nontender, no hepatomegaly  Ext: 2+  edema Musculoskeletal:  No deformities, BUE and BLE strength normal and equal Skin: warm and edematous and cold Neuro:  CNs 2-12 intact, no focal abnormalities noted Psych:  Normal affect     Laboratory Data:  High Sensitivity Troponin:   Recent Labs  Lab 06/10/2020 2143  TROPONINIHS 297*     Chemistry Recent Labs  Lab 06/11/2020 2143 06/01/2020 2257  NA 138 138  K 4.0 3.8  CL 100 102  CO2 19*  --   GLUCOSE 112* 106*  BUN 35* 38*  CREATININE 3.26* 3.00*  CALCIUM 8.8*  --   GFRNONAA 13*  --   ANIONGAP 19*  --     Recent Labs  Lab 06/13/2020 2143  PROT 5.7*  ALBUMIN 2.6*  AST 45*  ALT 22  ALKPHOS 47  BILITOT 3.9*   Hematology Recent Labs  Lab 05/30/2020 2143 05/26/2020 2257 06/13/20 0045  WBC 9.1  --  8.8  RBC 4.36  --  4.10  HGB 13.0 15.0 12.2  HCT 41.5 44.0 38.8  MCV 95.2  --  94.6  MCH 29.8  --  29.8  MCHC 31.3  --  31.4  RDW 21.5*  --  21.4*  PLT 112*  --  107*   BNP Recent Labs  Lab 05/24/2020 2145  BNP 1,382.0*    DDimer No results for input(s): DDIMER in the last 168 hours.   Radiology/Studies:  DG Chest Port 1 View  Result Date: 05/23/2020 CLINICAL DATA:  Vomiting. EXAM: PORTABLE CHEST 1 VIEW COMPARISON:  December 21, 2019 FINDINGS: Similar cardiac enlargement with central vascular congestion. Coronary artery calcifications. Right basilar consolidation with small right pleural effusion. The visualized skeletal structures are unchanged. IMPRESSION: 1. Right basilar consolidation with small right pleural effusion, concerning for pneumonia. 2. Similar cardiac enlargement with central vascular congestion. Electronically Signed   By: Dahlia Bailiff MD   On: 06/01/2020 23:02    ECHO" 2020 IMPRESSIONS   1. The left ventricle has normal systolic function, with an ejection  fraction of 55-60%. The cavity size was normal. There is moderate  asymmetric left ventricular hypertrophy. Left ventricular diastolic  Doppler parameters  are consistent with   pseudonormalization.  2. The right ventricle has normal systolic function. The cavity was  moderately enlarged. There is no increase in right ventricular wall  thickness. Right ventricular systolic pressure is moderately elevated.  3. Left atrial size was mildly dilated.  4. Right atrial size was moderately dilated.  5. The aortic valve is tricuspid. Severely thickening of the aortic  valve. Severe calcifcation of the aortic valve. Aortic valve regurgitation  is trivial by color flow Doppler. Moderate stenosis of the aortic valve.  AV Area (Vmax): 1.55 cm. AV Mean  Grad: 22.7 mmHg. LVOT/AV VTI ratio: 0.32. AV Vmax: 304.67 cm/s.    ECHO: 04/29/2020 IMPRESSIONS    1. Left ventricular ejection fraction, by estimation, is 40 to 45%. The  left ventricle has mildly decreased function. The left ventricle  demonstrates global hypokinesis. There is moderate left ventricular  hypertrophy. Left ventricular diastolic  parameters are indeterminate.  2. Right ventricular systolic function is mildly reduced. The right  ventricular size is moderately enlarged. The estimated right ventricular  systolic pressure is 64.3 mmHg.  3. Left atrial size was severely dilated.  4. Right atrial size was severely dilated.  5. The mitral valve is grossly normal. Severe mitral valve regurgitation,  suspected mechanism is secondary MR from LV dysfunction and atrial MR. No  evidence of mitral stenosis.  6. Tricuspid valve regurgitation is severe. Incomplete coaptation of  tricuspid valve leaflets likely from annular dilation.  7. The aortic valve is abnormal. There is severe calcifcation of the  aortic valve. Aortic valve regurgitation is mild. Aortic valve mean  gradient measures 20.0 mmHg. Low flow low gradient aortic valve stenosis.  Based on valve appearance, there is at  least moderate-severe aortic valve stenosis.  8. Aortic dilatation noted. There is mild dilatation of the ascending  aorta,  measuring 40 mm.  9. Cannot exclude atrial level shunt by color flow Doppler.  Assessment and Plan:   1. Hypotension- asymptomatic (Impending cardiogenic shock) 2. Acute decompensated HF, HFrEF 40-45% 3. Moderate to severe AS LFLG AS (AVA 0.5cm2, PG 36mmHg), severe MR, severe TR 4. Advanced Dementia 5. Paroxysmal afib/flutter- not on ac 6. H/o falls, nurshing home resident, deconditioning 7. Advanced age, frailty  8. H/o HTN, HLD 9. Elevated trop from demand ischemia 10. Lower GI bleed- hemorrhoids vs unclear  The patient likely has LF LG severe AS (calcific valve), she has HFrEF 40% (decline from 2020- suspect likely from the AS), severe MR and severe TR on recent ECHO in March 2022. She is cold to touch, JVD is up, lungs are crackly-, BP is soft and hypoxic. Got IVF 81cc in the ER I had an extensive talk with the daughter- she is in decompensated state and is she were to worsen- then we should discuss about comfort care measures. She is on palliative care and is DNR already. The daughter is very understandable and agreeable  Plan: supportive care I suggest we support her via oxygen. I am hesitant to give her lasix as if I reduce her preload she may get worse. She seems comfortable on laying down. I  - if BP were to drop and she is in cardiogenic shock then would advise on comfort care. Focus on alleviating pain and comfort - if she is symptomatic SOB- then would give her IV lasix 20mg x1    Risk Assessment/Risk Scores:        New York Heart Association (NYHA) Functional Class NYHA Class III  CHA2DS2-VASc Score =  This indicates a  % annual risk of stroke. The patient's score is based upon:           For questions or updates, please contact [employer] Please consult [email or web address] for contact info under    Signed, Renae Fickle, MD  06/13/2020 1:17 AM

## 2020-06-13 NOTE — Progress Notes (Addendum)
Progress Note  Patient Name: [name] [name] Date of Encounter: 06/13/2020  [facility] Cardiologist: Quay Burow, MD   Subjective   Patient has advanced dementia which is limiting history telling, she responded to her name, states no when asked if she has pain or dyspnea.  She is able to follow one-step commands.  Her daughter/healthcare agent Levander Campion is at bedside.  She is tearful, reports patient was placed to SNF a month ago, has been progressively deteriorating in overall function.  She reports patient has poor memory and cognitive function, has difficulty with communication, is mostly bedbound without much ambulation due to severe weakness.  Patient has not been eating or drinking well, had ongoing nausea with vomiting for some time.  She noted bright red blood per rectum and coffee-ground emesis yesterday, therefore took the patient to the ER.  [name] expressed the wish to make patient comfortable and less suffering and do not wish extravagant interventions, as she understands patient is critically ill multiple comorbidities.   Inpatient Medications    Scheduled Meds:  pantoprazole  40 mg Oral Daily   sodium chloride flush  3 mL Intravenous Q12H   sodium chloride flush  3 mL Intravenous Q12H   Continuous Infusions:  sodium chloride     PRN Meds: sodium chloride, acetaminophen **OR** acetaminophen, bisacodyl, glycopyrrolate, ondansetron, polyvinyl alcohol, sodium chloride flush   Vital Signs    Vitals:   06/13/20 0400 06/13/20 0430 06/13/20 0500 06/13/20 0540  BP: 90/69 (!) 83/63 (!) 87/51 91/75  Pulse:      Resp: (!) 21 (!) 21 (!) 21 (!) 24  Temp:      TempSrc:      SpO2:    92%  Weight:      Height:       No intake or output data in the 24 hours ending 06/13/20 1000 Last 3 Weights 06/13/2020 06/10/2020 05/25/2020  Weight (lbs) 176 lb 2.4 oz 188 lb 180 lb  Weight (kg) 79.9 kg 85.276 kg 81.647 kg      Telemetry    Atrial flutter/fibrillation with rate of  100-110s - Personally Reviewed  ECG    No new tracing, EKG ordered today - Personally Reviewed  Physical Exam   GEN:  Laying flat in bed, no acute distress Neck: +JVD Cardiac:  Irregularly irregular, systolic murmur grade II at RUSB Respiratory:  Coarse crackles bilaterally, on 2 L nasal cannula oxygen GI: Soft, nontender, non-distended  MS: 3+ P pitting edema of bilateral lower extremity Neuro:   Open eyes to voice, oriented to person, followed one-step commands, moderate chest/memory deficit Psych: Calm and cooperative  Labs    High Sensitivity Troponin:   Recent Labs  Lab 06/09/2020 2143 06/13/20 0045  TROPONINIHS 297* 320*      Chemistry Recent Labs  Lab 06/08/2020 2143 06/06/2020 2257 06/13/20 0045  NA 138 138 139  K 4.0 3.8 4.1  CL 100 102 102  CO2 19*  --  19*  GLUCOSE 112* 106* 111*  BUN 35* 38* 35*  CREATININE 3.26* 3.00* 3.32*  CALCIUM 8.8*  --  8.6*  PROT 5.7*  --  5.3*  ALBUMIN 2.6*  --  2.4*  AST 45*  --  45*  ALT 22  --  21  ALKPHOS 47  --  45  BILITOT 3.9*  --  3.5*  GFRNONAA 13*  --  13*  ANIONGAP 19*  --  18*     Hematology Recent Labs  Lab 06/13/2020 2143 05/31/2020  2257 06/13/20 0045  WBC 9.1  --  8.8  RBC 4.36  --  4.10  HGB 13.0 15.0 12.2  HCT 41.5 44.0 38.8  MCV 95.2  --  94.6  MCH 29.8  --  29.8  MCHC 31.3  --  31.4  RDW 21.5*  --  21.4*  PLT 112*  --  107*    BNP Recent Labs  Lab 06/01/2020 2145  BNP 1,382.0*     DDimer No results for input(s): DDIMER in the last 168 hours.   Radiology    DG Chest Port 1 View  Result Date: 05/26/2020 CLINICAL DATA:  Vomiting. EXAM: PORTABLE CHEST 1 VIEW COMPARISON:  December 21, 2019 FINDINGS: Similar cardiac enlargement with central vascular congestion. Coronary artery calcifications. Right basilar consolidation with small right pleural effusion. The visualized skeletal structures are unchanged. IMPRESSION: 1. Right basilar consolidation with small right pleural effusion, concerning for  pneumonia. 2. Similar cardiac enlargement with central vascular congestion. Electronically Signed   By: Dahlia Bailiff MD   On: 05/27/2020 23:02    Cardiac Studies   Echo from 04/29/2020:    1. Left ventricular ejection fraction, by estimation, is 40 to 45%. The  left ventricle has mildly decreased function. The left ventricle  demonstrates global hypokinesis. There is moderate left ventricular  hypertrophy. Left ventricular diastolic  parameters are indeterminate.   2. Right ventricular systolic function is mildly reduced. The right  ventricular size is moderately enlarged. The estimated right ventricular  systolic pressure is 16.1 mmHg.   3. Left atrial size was severely dilated.   4. Right atrial size was severely dilated.   5. The mitral valve is grossly normal. Severe mitral valve regurgitation,  suspected mechanism is secondary MR from LV dysfunction and atrial MR. No  evidence of mitral stenosis.   6. Tricuspid valve regurgitation is severe. Incomplete coaptation of  tricuspid valve leaflets likely from annular dilation.   7. The aortic valve is abnormal. There is severe calcifcation of the  aortic valve. Aortic valve regurgitation is mild. Aortic valve mean  gradient measures 20.0 mmHg. Low flow low gradient aortic valve stenosis.  Based on valve appearance, there is at  least moderate-severe aortic valve stenosis.   8. Aortic dilatation noted. There is mild dilatation of the ascending  aorta, measuring 40 mm.   9. Cannot exclude atrial level shunt by color flow Doppler.    Echo from 08/26/2020:     1. The left ventricle has normal systolic function, with an ejection  fraction of 55-60%. The cavity size was normal. There is moderate  asymmetric left ventricular hypertrophy. Left ventricular diastolic  Doppler parameters are consistent with  pseudonormalization.   2. The right ventricle has normal systolic function. The cavity was  moderately enlarged. There is no  increase in right ventricular wall  thickness. Right ventricular systolic pressure is moderately elevated.   3. Left atrial size was mildly dilated.   4. Right atrial size was moderately dilated.   5. The aortic valve is tricuspid. Severely thickening of the aortic  valve. Severe calcifcation of the aortic valve. Aortic valve regurgitation  is trivial by color flow Doppler. Moderate stenosis of the aortic valve.  AV Area (Vmax): 1.55 cm. AV Mean  Grad: 22.7 mmHg. LVOT/AV VTI ratio: 0.32. AV Vmax: 304.67 cm/s.     Patient Profile     87 yr old female with PMH of dementia, atrial flutter/fibrillation (not on AC due to falls/weakness), HTN, chronic combined heart failure, severe  MR, severe TR, severe AS, HLD, malnutrition, RA, CKD stage IIIb, who presented with nausea, vomiting, bloody stool. She is currently admitted for AKI, decompensated CHF, and GI bleed. Cardiology is consulted and following for CHF, elevated trop at the request of Dr. Sheppard Coil.   Assessment & Plan    Acute hypoxic respiratory failure Acute on chronic combined heart failure  - presented with nausea, vomiting, bloody diarrhea on 05/20/2020, daughter reports patient had ongoing shortness of breath, lower leg edema for 1 month; currently hypoxic requiring 2LNC  - BNP 1382 - Hs trop 297>320, will trend to peak - EKG with atrial flutter with rate of 117bpm, telemetry with persistent a flutter/fibrillation 100s - CXR showed right basilar congestion with small right pleural effusion ? pneumonia, and cardiac enlargement with central vascular congestion  - clinically patient is significantly hypervolemic - please monitor intake and output , daily weight, low sodium diet - hypotension which is limiting diuresis and GDMT with BB, ACEi, SGLT2i - Echo repeat is pending today  - Lengthy time spent discussing goal of care with patient's daughter/HCP [name] at bedside, she had good understanding of patient's chronic comorbidity +  deterioration over the past month, expressed wish to make patient comfortable with less suffering, and do not wish extravagant medical interventions at this time, confirmed DNR/DNI status  - agree with Palliative care consultation, consider comfort measures   Hypotension  - BP 85/71 -121/58 since admission - may consult advanced hearty failure team and ICU admission if family wishes   Elevated troponin  - suspect demand ischemia  - repeat 3rd trop - patient denied chest pain, although with dementia, appears comfortable   Severe MR, TR, AS - Echo from 04/29/2020 with EF 40 to 45%, LV global hypokinesis, moderate LVH, indeterminate diastolic function, mild reduced RV systolic function, RV moderately enlarged, severely dilated left atrium and right atrium, severe MR, severe TR, severe  AS.  -Repeat echocardiogram is pending today -Patient is not a good candidate for surgical intervention  Persistent A flutter/flibrillation  - rate 100s, difficult to control as metorpolo 12.5mg  XL currently held due to hypotension  - CHA2DS2VASc score is 4 due to age, gender, CHF - unable use anticoagulation due to high risk of bleeding complication from advanced age, frequent fall, and weakness, also contraindicated now due to possible GI bleed   AKI  - Cr 2.5-2.8 ranges in March 2022 - Cr 3.26 POA, now 3.32 - consider urine study and renal U/S for further workup if family wishes  - suspect cardiorenal syndrome if other etiology ruled out - Monitor renal index daily, nephrotoxic agents  Hematochezia Frequent emesis - Hgb elevated 12.2 -15 thus far - FOBT+ - presented with bloody stool - pending GI consultation   Advanced dementia - family reports 1 month worsening function with poor PO intake, inability to ambulate, cognitive impairment, and ongoing N/V with BRBPR,  consider goal of care discussion before further diagnostic and therapeutic intervention        For questions or updates, please  contact [facility] Please consult [email or web address] for contact info under    Patient examined chart reviewed. Discussed care with daughters. She is not a candidate for further cardiac w/u Exam with somnolent black female AS/MR murmur lungs exp wheezing advanced dementia Having bedside echo now Agree with comfort care/ Hospice / Palliative  Cardiology will sign off   Jenkins Rouge MD [facility]    Signed, Margie Billet, NP  06/13/2020, 10:00 AM

## 2020-06-13 NOTE — Progress Notes (Signed)
Heart Failure Navigator Progress Note  Assessed for Heart & Vascular TOC clinic readiness.  Unfortunately at this time the patient does not meet criteria due to advanced dementia.   Navigator available for reassessment of patient.   Pricilla Holm, RN, BSN Heart Failure Nurse Navigator [phone]

## 2020-06-13 NOTE — Consult Note (Signed)
Reason for Consult: GI bleed. Referring Physician: IMTS.  [name] [name] is an 87 y.o. female.  HPI: [name] [name] is a 87 year old patient with multiple medical problems listed below. GI consultation was requested as she was noted to have blood in her diaper. As per my discussion with her daughter, [name] who is her POA no invasive GI interventions are desired for the patient as she has dementia and she is DNR.  Past Medical History:  Diagnosis Date  . Aortic stenosis   . Atrial fibrillation ([place])   . Carotid artery bruit 07/22/2012  . Chest pain    myoview 04/24/07-mild-mod perfusion defect with mild-mod superimposed ischemiain the mid inferior, apicla inferior, basal inferolateral and mid inferolateral regions  . Community acquired pneumonia 01/13/2013  . Dementia ([place]) 01/27/2020  . Edema   . Gallstone   . Generalized osteoarthritis   . HTN (hypertension)   . Hyperlipidemia   . Hypochloremia 01/13/2013  . Hyponatremia 01/13/2013  . IFG (impaired fasting glucose)   . Mild neurocognitive disorder 02/26/2019  . Nonspecific abnormal electrocardiogram (ECG) (EKG)   . Normocytic anemia 08/26/2018  . Porokeratosis 09/02/2018  . Premature atrial contractions 07/22/2012  . Renal insufficiency 08/26/2018  . Rhabdomyolysis 08/26/2018  . Rheumatoid arthritis ([place]) 01/13/2013  . Vertigo    as late effect of cerebrovascular disease    Past Surgical History:  Procedure Laterality Date  . APPENDECTOMY    . BUNIONECTOMY Bilateral   . cataract surgery Bilateral 05/2016  . gallstones removal    . TONSILLECTOMY      Family History  Problem Relation Age of Onset  . Diabetes Father   . Seizures Brother   . Alzheimer's disease Sister   . Cancer Sister   . Breast cancer Sister   . Cancer Sister   . Seizures Daughter   . Seizures Son   . Seizures Granddaughter   . Ovarian cancer Granddaughter   . Lung cancer Nephew   . Breast cancer Niece   . Breast cancer Niece   . Brain cancer Niece      Social History:  reports that she has never smoked. She has never used smokeless tobacco. She reports that she does not drink alcohol and does not use drugs.  Allergies:  Allergies  Allergen Reactions  . Codeine Nausea And Vomiting and Other (See Comments)    Dizziness   Medications: I have reviewed the patient's current medications.  Results for orders placed or performed during the hospital encounter of 05/29/2020 (from the past 48 hour(s))  CBC with Differential/Platelet     Status: Abnormal   Collection Time: 06/18/2020  9:43 PM  Result Value Ref Range   WBC 9.1 4.0 - 10.5 K/uL   RBC 4.36 3.87 - 5.11 MIL/uL   Hemoglobin 13.0 12.0 - 15.0 g/dL   HCT 41.5 36.0 - 46.0 %   MCV 95.2 80.0 - 100.0 fL   MCH 29.8 26.0 - 34.0 pg   MCHC 31.3 30.0 - 36.0 g/dL   RDW 21.5 (H) 11.5 - 15.5 %   Platelets 112 (L) 150 - 400 K/uL    Comment: SPECIMEN CHECKED FOR CLOTS Immature Platelet Fraction may be clinically indicated, consider ordering this additional test [id number] REPEATED TO VERIFY PLATELET COUNT CONFIRMED BY SMEAR    nRBC 0.3 (H) 0.0 - 0.2 %   Neutrophils Relative % 79 %   Neutro Abs 7.2 1.7 - 7.7 K/uL   Lymphocytes Relative 16 %   Lymphs Abs 1.4 0.7 -  4.0 K/uL   Monocytes Relative 4 %   Monocytes Absolute 0.4 0.1 - 1.0 K/uL   Eosinophils Relative 0 %   Eosinophils Absolute 0.0 0.0 - 0.5 K/uL   Basophils Relative 0 %   Basophils Absolute 0.0 0.0 - 0.1 K/uL   Immature Granulocytes 1 %   Abs Immature Granulocytes 0.06 0.00 - 0.07 K/uL   Polychromasia PRESENT     Comment: Performed at [facility] [address], [place], [place] [address]  Comprehensive metabolic panel     Status: Abnormal   Collection Time: 06/14/2020  9:43 PM  Result Value Ref Range   Sodium 138 135 - 145 mmol/L   Potassium 4.0 3.5 - 5.1 mmol/L   Chloride 100 98 - 111 mmol/L   CO2 19 (L) 22 - 32 mmol/L   Glucose, Bld 112 (H) 70 - 99 mg/dL    Comment: Glucose reference range applies only to samples  taken after fasting for at least 8 hours.   BUN 35 (H) 8 - 23 mg/dL   Creatinine, Ser 3.26 (H) 0.44 - 1.00 mg/dL   Calcium 8.8 (L) 8.9 - 10.3 mg/dL   Total Protein 5.7 (L) 6.5 - 8.1 g/dL   Albumin 2.6 (L) 3.5 - 5.0 g/dL   AST 45 (H) 15 - 41 U/L   ALT 22 0 - 44 U/L   Alkaline Phosphatase 47 38 - 126 U/L   Total Bilirubin 3.9 (H) 0.3 - 1.2 mg/dL   GFR, Estimated 13 (L) >60 mL/min    Comment: (NOTE) Calculated using the CKD-EPI Creatinine Equation (2021)    Anion gap 19 (H) 5 - 15    Comment: Performed at [facility], [place] [address] [place], [place] [address]  Lipase, blood     Status: None   Collection Time: 06/14/2020  9:43 PM  Result Value Ref Range   Lipase 26 11 - 51 U/L    Comment: Performed at [facility] [address], [place], [place] [address]  Troponin I (High Sensitivity)     Status: Abnormal   Collection Time: 06/10/2020  9:43 PM  Result Value Ref Range   Troponin I (High Sensitivity) 297 (HH) <18 ng/L    Comment: CRITICAL RESULT CALLED TO, READ BACK BY AND VERIFIED WITH: BERRY C,RN 04/24/2 2330 WAYK Performed at [facility], [place] [address] [place], [place] [address]   Brain natriuretic peptide     Status: Abnormal   Collection Time: 06/07/2020  9:45 PM  Result Value Ref Range   B Natriuretic Peptide 1,382.0 (H) 0.0 - 100.0 pg/mL    Comment: Performed at [facility] [address] [place], [place] [address]  POC occult blood, ED     Status: Abnormal   Collection Time: 05/26/2020 10:21 PM  Result Value Ref Range   Fecal Occult Bld POSITIVE (A) NEGATIVE  Type and screen     Status: None   Collection Time: 05/28/2020 10:49 PM  Result Value Ref Range   ABO/RH(D) B POS    Antibody Screen NEG    Sample Expiration      07-14-2020,2359 Performed at [facility], [place] [address], [place], [place] [address]   Resp Panel by RT-PCR (Flu A&B, Covid) Nasopharyngeal Swab     Status: None   Collection Time: 06/06/2020 10:56 PM   Specimen:  Nasopharyngeal Swab; Nasopharyngeal(NP) swabs in vial transport medium  Result Value Ref Range   SARS Coronavirus 2  by RT PCR NEGATIVE NEGATIVE    Comment: (NOTE) SARS-CoV-2 target nucleic acids are NOT DETECTED.  The SARS-CoV-2 RNA is generally detectable in upper respiratory specimens during the acute phase of infection. The lowest concentration of SARS-CoV-2 viral copies this assay can detect is 138 copies/mL. A negative result does not preclude SARS-Cov-2 infection and should not be used as the sole basis for treatment or other patient management decisions. A negative result may occur with  improper specimen collection/handling, submission of specimen other than nasopharyngeal swab, presence of viral mutation(s) within the areas targeted by this assay, and inadequate number of viral copies(<138 copies/mL). A negative result must be combined with clinical observations, patient history, and epidemiological information. The expected result is Negative.  Fact Sheet for Patients:  EntrepreneurPulse.com.au  Fact Sheet for Healthcare Providers:  IncredibleEmployment.be  This test is no t yet approved or cleared by the [place] FDA and  has been authorized for detection and/or diagnosis of SARS-CoV-2 by FDA under an Emergency Use Authorization (EUA). This EUA will remain  in effect (meaning this test can be used) for the duration of the COVID-19 declaration under Section 564(b)(1) of the Act, 21 U.S.C.section 360bbb-3(b)(1), unless the authorization is terminated  or revoked sooner.       Influenza A by PCR NEGATIVE NEGATIVE   Influenza B by PCR NEGATIVE NEGATIVE    Comment: (NOTE) The Xpert Xpress SARS-CoV-2/FLU/RSV plus assay is intended as an aid in the diagnosis of influenza from Nasopharyngeal swab specimens and should not be used as a sole basis for treatment. Nasal washings and aspirates are unacceptable for Xpert Xpress  SARS-CoV-2/FLU/RSV testing.  Fact Sheet for Patients: EntrepreneurPulse.com.au  Fact Sheet for Healthcare Providers: IncredibleEmployment.be  This test is not yet approved or cleared by the [place] FDA and has been authorized for detection and/or diagnosis of SARS-CoV-2 by FDA under an Emergency Use Authorization (EUA). This EUA will remain in effect (meaning this test can be used) for the duration of the COVID-19 declaration under Section 564(b)(1) of the Act, 21 U.S.C. section 360bbb-3(b)(1), unless the authorization is terminated or revoked.  Performed at [facility], [place] [address], [place], [place] [address]   I-stat chem 8, ED (not at [facility] or [facility])     Status: Abnormal   Collection Time: 05/31/2020 10:57 PM  Result Value Ref Range   Sodium 138 135 - 145 mmol/L   Potassium 3.8 3.5 - 5.1 mmol/L   Chloride 102 98 - 111 mmol/L   BUN 38 (H) 8 - 23 mg/dL   Creatinine, Ser 3.00 (H) 0.44 - 1.00 mg/dL   Glucose, Bld 106 (H) 70 - 99 mg/dL    Comment: Glucose reference range applies only to samples taken after fasting for at least 8 hours.   Calcium, Ion 1.01 (L) 1.15 - 1.40 mmol/L   TCO2 23 22 - 32 mmol/L   Hemoglobin 15.0 12.0 - 15.0 g/dL   HCT 44.0 36.0 - 46.0 %  Troponin I (High Sensitivity)     Status: Abnormal   Collection Time: 06/13/20 12:45 AM  Result Value Ref Range   Troponin I (High Sensitivity) 320 (HH) <18 ng/L    Comment: CRITICAL VALUE NOTED.  VALUE IS CONSISTENT WITH PREVIOUSLY REPORTED AND CALLED VALUE. (NOTE) Elevated high sensitivity troponin I (hsTnI) values and significant  changes across serial measurements may suggest ACS but many other  chronic and acute conditions are known to elevate hsTnI results.  Refer to the Links section for  chest pain algorithms and additional  guidance. Performed at [facility], [place] [address], [place], [place] [address]   Magnesium     Status: None   Collection Time:  06/13/20 12:45 AM  Result Value Ref Range   Magnesium 1.9 1.7 - 2.4 mg/dL    Comment: Performed at [facility] [address] , [place] [address]  Comprehensive metabolic panel     Status: Abnormal   Collection Time: 06/13/20 12:45 AM  Result Value Ref Range   Sodium 139 135 - 145 mmol/L   Potassium 4.1 3.5 - 5.1 mmol/L   Chloride 102 98 - 111 mmol/L   CO2 19 (L) 22 - 32 mmol/L   Glucose, Bld 111 (H) 70 - 99 mg/dL    Comment: Glucose reference range applies only to samples taken after fasting for at least 8 hours.   BUN 35 (H) 8 - 23 mg/dL   Creatinine, Ser 3.32 (H) 0.44 - 1.00 mg/dL   Calcium 8.6 (L) 8.9 - 10.3 mg/dL   Total Protein 5.3 (L) 6.5 - 8.1 g/dL   Albumin 2.4 (L) 3.5 - 5.0 g/dL   AST 45 (H) 15 - 41 U/L   ALT 21 0 - 44 U/L   Alkaline Phosphatase 45 38 - 126 U/L   Total Bilirubin 3.5 (H) 0.3 - 1.2 mg/dL   GFR, Estimated 13 (L) >60 mL/min    Comment: (NOTE) Calculated using the CKD-EPI Creatinine Equation (2021)    Anion gap 18 (H) 5 - 15    Comment: Performed at [facility], [place] [address], [place], [place] [address]  CBC     Status: Abnormal   Collection Time: 06/13/20 12:45 AM  Result Value Ref Range   WBC 8.8 4.0 - 10.5 K/uL   RBC 4.10 3.87 - 5.11 MIL/uL   Hemoglobin 12.2 12.0 - 15.0 g/dL   HCT 38.8 36.0 - 46.0 %   MCV 94.6 80.0 - 100.0 fL   MCH 29.8 26.0 - 34.0 pg   MCHC 31.4 30.0 - 36.0 g/dL   RDW 21.4 (H) 11.5 - 15.5 %   Platelets 107 (L) 150 - 400 K/uL    Comment: CONSISTENT WITH PREVIOUS RESULT   nRBC 0.2 0.0 - 0.2 %    Comment: Performed at [facility], [place] [address] [place], [place] [address]  ABO/Rh     Status: None   Collection Time: 06/13/20  7:18 AM  Result Value Ref Range   ABO/RH(D)      B POS Performed at [facility] [address], [place],  [address]     DG Chest Port 1 View  Result Date: 06/11/2020 CLINICAL DATA:  Vomiting. EXAM: PORTABLE CHEST 1 VIEW COMPARISON:  December 21, 2019  FINDINGS: Similar cardiac enlargement with central vascular congestion. Coronary artery calcifications. Right basilar consolidation with small right pleural effusion. The visualized skeletal structures are unchanged. IMPRESSION: 1. Right basilar consolidation with small right pleural effusion, concerning for pneumonia. 2. Similar cardiac enlargement with central vascular congestion. Electronically Signed   By: Dahlia Bailiff MD   On: 06/17/2020 23:02    Review of Systems  Unable to perform ROS: Dementia   Blood pressure 93/72, pulse (!) 101, temperature 97.7 F (36.5 C), temperature source Oral, resp. rate (!) 24, height 5\' 6"  (1.676 m), weight 79.9 kg, SpO2 92 %. Physical Exam Not done. Assessment/Plan: GI bleeding: As per my discussion with the patient's daughter, [name], who  is POA, no further invasive procedures are needed for her at this time. She is DNR.   Juanita Craver 06/13/2020, 12:12 PM

## 2020-06-13 NOTE — H&P (Addendum)
History and Physical   [name] [name] HUT:[id number] DOB: Feb 28, 1928 DOA: 06/18/2020  PCP: Merrilee Seashore, MD   Patient coming from: [facility]  Chief Complaint: Emesis, bloody stools  HPI: [name] [name] is a 87 y.o. female with medical history significant of dementia, a flutter, A. fib, hypertension, heart failure, severe mitral regurgitation and severe aortic stenosis, hyperlipidemia, malnutrition, rheumatoid arthritis, CKD who presents with nausea, vomiting, bloody stools.   History obtained with assistance of patient's family and chart review due to her dementia.  Patient thinks that she just ate something and states that she feels okay now.  Was noted to have several episodes of blood in her diaper at facility and had nausea and vomiting that was nonbloody.  Her family noticed this and called EMS for further evaluation.   Patient also has a history of heart failure and has some chronic edema.  She was recently started on a low dose diuretic due to her persistent edema.Family ports patient has a bedsore.  Patient has also had some intermittent shortness of breath but denies any abdominal pain.  As below, patient is DNR but would be okay and family would be okay with interventions including IV blood pressure support.  They would not want significant procedural intervention.  ED Course: Vital signs in ED significant for blood pressure soft in the 65K to 812X systolic, heart rate in the 100s, respirations in the 20s, temperature 97.5.  Lab work-up showed CMP with creatinine of 3.2 up from baseline 2.5.  Calcium 8.8, protein 5.7, albumin 2.2, AST 645, T bili 3.9.  FOBT was positive, lipase negative.  CBC within normal limits.  Respiratory panel flu COVID pending.  Troponin came back elevated at 297 and BNP elevated at 1382.  Patient was typed and screened in the ED.  Chest x-ray showed right basilar consolidation with small pleural effusion.  Cardiomegaly.  Central  vascular congestion.  A message was sent to GI by the EDP and I consulted cardiology for further assistance.  Review of Systems: As per HPI otherwise all other systems reviewed and are negative.  Past Medical History:  Diagnosis Date  . Aortic stenosis   . Atrial fibrillation ([place])   . Carotid artery bruit 07/22/2012  . Chest pain    myoview 04/24/07-mild-mod perfusion defect with mild-mod superimposed ischemiain the mid inferior, apicla inferior, basal inferolateral and mid inferolateral regions  . Community acquired pneumonia 01/13/2013  . Dementia ([name] [name]) 01/27/2020  . Edema   . Gallstone   . Generalized osteoarthritis   . HTN (hypertension)   . Hyperlipidemia   . Hypochloremia 01/13/2013  . Hyponatremia 01/13/2013  . IFG (impaired fasting glucose)   . Mild neurocognitive disorder 02/26/2019  . Nonspecific abnormal electrocardiogram (ECG) (EKG)   . Normocytic anemia 08/26/2018  . Porokeratosis 09/02/2018  . Premature atrial contractions 07/22/2012  . Renal insufficiency 08/26/2018  . Rhabdomyolysis 08/26/2018  . Rheumatoid arthritis ([place]) 01/13/2013  . Vertigo    as late effect of cerebrovascular disease    Past Surgical History:  Procedure Laterality Date  . APPENDECTOMY    . BUNIONECTOMY Bilateral   . cataract surgery Bilateral 05/2016  . gallstones removal    . TONSILLECTOMY      Social History  reports that she has never smoked. She has never used smokeless tobacco. She reports that she does not drink alcohol and does not use drugs.  Allergies  Allergen Reactions  . Codeine Nausea And Vomiting and Other (See Comments)  Dizziness    Family History  Problem Relation Age of Onset  . Diabetes Father   . Seizures Brother   . Alzheimer's disease Sister   . Cancer Sister   . Breast cancer Sister   . Cancer Sister   . Seizures Daughter   . Seizures Son   . Seizures Granddaughter   . Ovarian cancer Granddaughter   . Lung cancer Nephew   . Breast cancer Niece   . Breast  cancer Niece   . Brain cancer Niece   Reviewed on admission  Prior to Admission medications   Medication Sig Start Date End Date Taking? Authorizing Provider  bisacodyl (DULCOLAX) 10 MG suppository Place 1 suppository (10 mg total) rectally daily as needed for moderate constipation. 05/11/20   Lattie Haw, MD  furosemide (LASIX) 40 MG tablet Take 1 tablet (40 mg total) by mouth daily. 05/25/20 08/23/20  Lorretta Harp, MD  glycopyrrolate (ROBINUL) 0.2 MG/ML injection Inject 1 mL (0.2 mg total) into the skin every 4 (four) hours as needed (excessive secretions). 05/11/20   Lattie Haw, MD  glycopyrrolate (ROBINUL) 1 MG tablet Take 1 tablet (1 mg total) by mouth every 4 (four) hours as needed (excessive secretions). 05/11/20   Lattie Haw, MD  haloperidol (HALDOL) 0.5 MG tablet Take 1 tablet (0.5 mg total) by mouth every 4 (four) hours as needed for agitation (or delirium). 05/11/20   Lattie Haw, MD  LORazepam (ATIVAN) 1 MG tablet Take 1 tablet (1 mg total) by mouth every 4 (four) hours as needed for anxiety. 05/11/20   Lattie Haw, MD  metoprolol succinate (TOPROL XL) 25 MG 24 hr tablet Take 0.5 tablets (12.5 mg total) by mouth daily. 05/25/20   Lorretta Harp, MD  ondansetron (ZOFRAN-ODT) 4 MG disintegrating tablet Take 1 tablet (4 mg total) by mouth every 6 (six) hours as needed for nausea. 05/11/20   Lattie Haw, MD  pantoprazole (PROTONIX) 40 MG tablet Take 1 tablet (40 mg total) by mouth daily. 12/30/19   Elgergawy, Silver Huguenin, MD  polyethylene glycol (MIRALAX / GLYCOLAX) 17 g packet Take 17 g by mouth 2 (two) times daily. 05/11/20   Lattie Haw, MD  polyvinyl alcohol (LIQUIFILM TEARS) 1.4 % ophthalmic solution Place 1 drop into both eyes 4 (four) times daily as needed for dry eyes. 05/11/20   Lattie Haw, MD  sertraline (ZOLOFT) 100 MG tablet Take 100 mg by mouth daily.  12/04/17   [provider]    Physical Exam: Vitals:   05/20/2020 2134 06/10/2020 2138 06/11/2020 2139   2230  BP:  (!) 89/60  103/84  Pulse:  (!) 116  (!) 45  Resp:  (!) 23  (!) 24  Temp: 98.6 F (37 C) (!) 97.5 F (36.4 C)    TempSrc: Axillary Oral    SpO2:  93%  97%  Weight:   85.3 kg   Height:   5\' 7"  (1.702 m)    Physical Exam Constitutional:      General: She is not in acute distress.    Appearance: Normal appearance.  HENT:     Head: Normocephalic and atraumatic.     Mouth/Throat:     Mouth: Mucous membranes are moist.     Pharynx: Oropharynx is clear.  Eyes:     Extraocular Movements: Extraocular movements intact.     Pupils: Pupils are equal, round, and reactive to light.  Cardiovascular:     Rate and Rhythm: Tachycardia present. Rhythm irregular.     Pulses:  Normal pulses.     Heart sounds: Murmur heard.    Pulmonary:     Effort: Pulmonary effort is normal. No respiratory distress.     Breath sounds: Rales (RLL) present.  Abdominal:     General: Bowel sounds are normal. There is no distension.     Palpations: Abdomen is soft.     Tenderness: There is no abdominal tenderness.  Musculoskeletal:        General: No swelling or deformity.     Right lower leg: Edema present.     Left lower leg: Edema present.  Skin:    General: Skin is warm and dry.  Neurological:     General: No focal deficit present.     Mental Status: Mental status is at baseline.     Labs on Admission: I have personally reviewed following labs and imaging studies  CBC: Recent Labs  Lab 06/18/2020 2143 05/30/2020 2257  WBC 9.1  --   NEUTROABS 7.2  --   HGB 13.0 15.0  HCT 41.5 44.0  MCV 95.2  --   PLT 112*  --     Basic Metabolic Panel: Recent Labs  Lab 06/11/2020 2143 06/09/2020 2257  NA 138 138  K 4.0 3.8  CL 100 102  CO2 19*  --   GLUCOSE 112* 106*  BUN 35* 38*  CREATININE 3.26* 3.00*  CALCIUM 8.8*  --     GFR: Estimated Creatinine Clearance: 14.3 mL/min (A) (by C-G formula based on SCr of 3 mg/dL (H)).  Liver Function Tests: Recent Labs  Lab 06/07/2020 2143   AST 45*  ALT 22  ALKPHOS 47  BILITOT 3.9*  PROT 5.7*  ALBUMIN 2.6*    Urine analysis:    Component Value Date/Time   COLORURINE AMBER (A) 05/02/2020 2244   APPEARANCEUR CLOUDY (A) 05/02/2020 2244   LABSPEC 1.023 05/02/2020 2244   PHURINE 5.0 05/02/2020 2244   GLUCOSEU NEGATIVE 05/02/2020 2244   HGBUR SMALL (A) 05/02/2020 2244   BILIRUBINUR NEGATIVE 05/02/2020 2244   KETONESUR NEGATIVE 05/02/2020 2244   PROTEINUR 100 (A) 05/02/2020 2244   UROBILINOGEN 0.2 01/13/2013 1712   NITRITE NEGATIVE 05/02/2020 2244   LEUKOCYTESUR TRACE (A) 05/02/2020 2244    Radiological Exams on Admission: DG Chest Port 1 View  Result Date: 06/02/2020 CLINICAL DATA:  Vomiting. EXAM: PORTABLE CHEST 1 VIEW COMPARISON:  December 21, 2019 FINDINGS: Similar cardiac enlargement with central vascular congestion. Coronary artery calcifications. Right basilar consolidation with small right pleural effusion. The visualized skeletal structures are unchanged. IMPRESSION: 1. Right basilar consolidation with small right pleural effusion, concerning for pneumonia. 2. Similar cardiac enlargement with central vascular congestion. Electronically Signed   By: Dahlia Bailiff MD   On: 06/09/2020 23:02   EKG: Independently reviewed.  Atrial flutter at 117 bpm.  Low voltage.  Assessment/Plan Principal Problem:   Acute on chronic combined systolic and diastolic CHF (congestive heart failure) (HCC) Active Problems:   Essential hypertension   Hyperlipidemia   Dementia (HCC)   Atrial flutter (HCC)   Acute kidney injury ([place])   Demand ischemia (HCC)  Acute on chronic CHF > Patient noted to have BNP elevated to 1300, troponin 297 likely demand, rales of the right base with pleural effusion, central vascular congestion on chest x-ray, AKI. > Does not report shortness of breath.  But does have persistent lower extremity edema despite starting on a diuretic patient recommendation from outpatient cardiology. > Last echo was in  March with EF 40-45%, severe mitral regurgitation,  severe aortic stenosis mildly reduced systolic function. > Difficult to obtain balance given soft blood pressures in the ED and evidence of heart failure.  We will hold off on diuresis or fluid for now given this combination. > Of note patient is DNR and would not want significant procedure went to inventions per family.  They are okay with IV medications including IV medications for blood pressure support > We will consult cardiology for further assistance with management of CHF in the setting of her significant valvular disease and soft blood pressures.  If it turns out she is not a candidate or does not respond well to therapy she may need to be transition to comfort measures. > Did receive 500 cc in the ED - Monitor in progressive unit - Appreciate cardiology recommendations - Strict I's/O, daily weight - Have reordered echocardiogram in case there been any acute changes - Holding home Lasix and metoprolol for now - We will follow cardiology recommendations for possible diuresis or any possible inotropic support if indicated and/or appropriate. - Check magnesium  AKI on CKD 4 > Creatinine elevated to 3.2 from baseline around 2.5 > Possibly due to volume overload in the setting of CHF  - Avoid nephrotoxic agents - Trend renal function and electrolytes  Heme positive stools GI bleed > Patient presented with bloody stools initially along with some nausea and vomiting. > Hemoglobin is stable but stools are heme positive - We will trend hemoglobin patient has been typed and screened - EDP sent message to GI for consultation in the morning  Hypertension - Soft blood pressures, holding Lasix and metoprolol  Atrial fib/flutter - Not a candidate for anticoagulation due to history of falls and weakness - Holding home metoprolol  Dementia - Noted  DVT prophylaxis: None given GI bleed  Code Status:   DNR.  Family states that she would  not want significant procedural interventions.  They state they would be okay with IV medications including IV medications and drips for blood pressure support for temporary.  Family Communication:  Discussed with daughter at bedside Disposition Plan:   Patient is from:  [facility]  Anticipated DC to:  Pending clinical course  Anticipated DC date:  1 to 7 days  Anticipated DC barriers: None  Consults called:  Cardiology, Dr. Rudi Rummage.  Message sent to GI for consult by EDP  Admission status:  Observation, progressive  Severity of Illness: The appropriate patient status for this patient is OBSERVATION. Observation status is judged to be reasonable and necessary in order to provide the required intensity of service to ensure the patient's safety. The patient's presenting symptoms, physical exam findings, and initial radiographic and laboratory data in the context of their medical condition is felt to place them at decreased risk for further clinical deterioration. Furthermore, it is anticipated that the patient will be medically stable for discharge from the hospital within 2 midnights of admission. The following factors support the patient status of observation.   " The patient's presenting symptoms include nausea, bloody stool. " The physical exam findings include edema, rales. " The initial radiographic and laboratory data are chest x-ray with small pleural effusion, cardiac enlargement, central vascular congestion, creatinine 3.2 from baseline of 2.5, T bili 3.9, troponin 297, FOBT positive, BNP 1382.   Marcelyn Bruins MD [facility]  How to contact the [facility] Attending or Consulting provider [place] or covering provider during after hours [place], for this patient?   1. Check the  care team in [facility] and look for a) attending/consulting [place] provider listed and b) the [facility] team listed 2. Log into [email or web address] and use [place]'s universal password to access. If you do not  have the password, please contact the hospital operator. 3. Locate the [facility] provider you are looking for under [facility] and page to a number that you can be directly reached. 4. If you still have difficulty reaching the provider, please page the [facility] (Director on Call) for the Hospitalists listed on amion for assistance.  06/13/2020, 12:40 AM

## 2020-06-13 NOTE — Progress Notes (Signed)
  Echocardiogram 2D Echocardiogram with strain and 3D has been performed.  Darlina Sicilian M 06/13/2020, 10:18 AM

## 2020-06-13 NOTE — Consult Note (Signed)
Consultation Note Date: 06/13/2020   Patient Name: [name] [name]  DOB: 01/08/1929  MRN: [id number]  Age / Sex: 87 y.o., female  PCP: Merrilee Seashore, MD Referring Physician: Tawni Millers  Reason for Consultation: Establishing goals of care and symptom management  HPI/Patient Profile: 87 y.o. female  with past medical history of dementia, atrial flutter, atrial fibrillation, hypertension, heart failure, severe mitral regurgitation, severe aortic stenosis, malnutrition, and CKD presented to [name] ED on 05/27/2020 from [name] with family reports of blood in patient's stool along with nausea and vomiting.  Patient was admitted on 06/08/2020 with acute on chronic CHF, AKI on CKD4, hypotension, and GI bleed.   Cardiology was consulted due to patient's extensive cardiac history and hypotension-they felt patient to be in a decompensated state and feel comfort measures are appropriate at this time.  GI was also consulted for GI bleed -family were clear they did not wish to pursue invasive interventions such as colonoscopy.   Of note, patient was recently admitted from 3/10 - 05/11/2020 for hypokalemia and generalized weakness.  PMT saw patient and family during this admission.  ED Course: Vital signs in ED significant for blood pressure soft in the 18A to 416S systolic, heart rate in the 100s, respirations in the 20s, temperature 97.5.  Lab work-up showed CMP with creatinine of 3.2 up from baseline 2.5.  Calcium 8.8, protein 5.7, albumin 2.2, AST 645, T bili 3.9.  FOBT was positive, lipase negative.  CBC within normal limits.  Respiratory panel flu COVID pending.  Troponin came back elevated at 297 and BNP elevated at 1382.  Patient was typed and screened in the ED.  Chest x-ray showed right basilar consolidation with small pleural effusion.  Cardiomegaly.  Central vascular  congestion.  A message was sent to GI by the EDP and I consulted cardiology for further assistance.  Clinical Assessment and Goals of Care: I have reviewed medical records including EPIC notes, labs, and imaging. Received report from primary RN -no acute concerns other than clarifying the patient's plan of care as her blood pressure remains low.  Went to visit patient at bedside -daughter/[name], son/Raymond, granddaughter were present. Patient was lying in bed awake, alert, oriented but confused at times, and able to participate in minimal conversation.  Patient falls asleep intermittently during my visit today. No signs or non-verbal gestures of pain or discomfort noted. No respiratory distress, increased work of breathing, or secretions noted.  Patient denies any pain or shortness of breath.  She is wearing supplemental oxygen.  Met with family members at bedside as well as patient's other son/Herman via speaker phone to discuss diagnosis, prognosis, [name], [name] wishes, disposition, and options.  I re-introduced Palliative Medicine as specialized medical care for people living with serious illness. It focuses on providing relief from the symptoms and stress of a serious illness. The goal is to improve quality of life for both the patient and the family.  We discussed interval history since patient's hospitalization last month.  After  patient's hospitalization in March she was discharged to [facility] - family report patient had a continued functional and mental decline at [name].  Patient used to be able to ambulate with assistance but now is mostly bedbound and refusing to eat or drink.  We discussed patient's current illness and what it means in the larger context of patient's on-going co-morbidities.  Family have a clear understanding of patient's acute medical situation.  Family also understands that dementia, CHF, and CKD are progressive, non-curable disease underlying the patient's current  acute medical conditions. Natural disease trajectory and expectations at [name] were discussed. I attempted to elicit values and goals of care important to the patient. The difference between aggressive medical intervention and comfort care was considered in light of the patient's goals of care.  Validated that family were not interested in pursuing invasive procedures for GI bleed. Family are clear in their desire to keep the patient comfortable and free from pain.  Family state they have had discussions with patient around her goals and wishes -patient would not want her life prolonged with aggressive medical interventions and and told them "no tubes." We talked about transition to comfort measures in house and what that would entail inclusive of medications to control pain, dyspnea, agitation, nausea, itching, and hiccups. We discussed stopping all unnecessary measures such as blood draws, needle sticks, oxygen, antibiotics, CBGs/insulin, cardiac monitoring, and frequent vital signs.  Family were understandably tearful, but agreed for transition to full comfort measures today in house -they understand this also includes no blood transfusions. Patient did seem to understand this part of the discussion as she was shaking her head in agreement and when asked how she felt about the plan she replied "yes."  Hospice services outpatient were briefly explained and offered.  Reviewed with family that patient at this time seems too unstable for transfer and recommendation was given that patient remain in house for end-of-life care -family were agreeable.  Prognostication was reviewed. Family understand that assessments are ongoing.  Family are clear they do not want patient going back to [name] as they feel the patient did not receive optimal care at this facility.  Visit also consisted of discussions dealing with the complex and emotionally intense issues of symptom management and palliative care in the setting of  serious and life-threatening illness. Palliative care team will continue to support patient, patient's family, and medical team.  Discussed with patient/family the importance of continued conversation with each other and the medical providers regarding overall plan of care and treatment options, ensuring decisions are within the context of the patient's values and GOCs.    Current [place] end-of-life visitation policy was reviewed -family expressed understanding.  Therapeutic listening and emotional support was provided throughout visit.  Family expressed appreciation for PMT visit today.  Questions and concerns were addressed. The patient/family was encouraged to call with questions and/or concerns. PMT card was provided.   Primary Decision Maker: NEXT OF KIN - all reasonably available adult children:  [name], Raymond, Herman    SUMMARY OF RECOMMENDATIONS: Initiated full comfort measures - prognosis likely hours to days Continue DNR/DNI as previously documented Patient does not seem stable for transfer to residential hospice at this time - anticipate hospital death Added orders for [name] symptom management and to reflect full comfort measures, as well as discontinued orders that were not focused on comfort Unrestricted visitation orders were placed per current [place] [name] visitation policy  Nursing to provide frequent assessments  and administer PRN medications as clinically necessary to ensure [name] comfort PMT will continue to follow holistically   Code Status/Advance Care Planning:  DNR  Palliative Prophylaxis:   Aspiration, Bowel Regimen, Delirium Protocol, Frequent Pain Assessment, Oral Care and Turn Reposition  Additional Recommendations (Limitations, Scope, Preferences):  Full Comfort Care  Psycho-social/Spiritual:   Desire for further Chaplaincy support:no Created space and opportunity for patient and family to express thoughts and feelings regarding  patient's current medical situation.   Emotional support and therapeutic listening provided.  Prognosis:   Hours - Days  Discharge Planning: Anticipated Hospital Death      Primary Diagnoses: Present on Admission: . Demand ischemia ([place]) . Acute on chronic combined systolic and diastolic CHF (congestive heart failure) ([place]) . Acute kidney injury ([place]) . Atrial flutter ([place]) . Dementia ([place]) . Essential hypertension . Hyperlipidemia   I have reviewed the medical record, interviewed the patient and family, and examined the patient. The following aspects are pertinent.  Past Medical History:  Diagnosis Date  . Aortic stenosis   . Atrial fibrillation ([place])   . Carotid artery bruit 07/22/2012  . Chest pain    myoview 04/24/07-mild-mod perfusion defect with mild-mod superimposed ischemiain the mid inferior, apicla inferior, basal inferolateral and mid inferolateral regions  . Community acquired pneumonia 01/13/2013  . Dementia ([place]) 01/27/2020  . Edema   . Gallstone   . Generalized osteoarthritis   . HTN (hypertension)   . Hyperlipidemia   . Hypochloremia 01/13/2013  . Hyponatremia 01/13/2013  . IFG (impaired fasting glucose)   . Mild neurocognitive disorder 02/26/2019  . Nonspecific abnormal electrocardiogram (ECG) (EKG)   . Normocytic anemia 08/26/2018  . Porokeratosis 09/02/2018  . Premature atrial contractions 07/22/2012  . Renal insufficiency 08/26/2018  . Rhabdomyolysis 08/26/2018  . Rheumatoid arthritis ([place]) 01/13/2013  . Vertigo    as late effect of cerebrovascular disease   Social History   Socioeconomic History  . Marital status: Divorced    Spouse name: Not on file  . Number of children: 4  . Years of education: 20  . Highest education level: Some college, no degree  Occupational History  . Not on file  Tobacco Use  . Smoking status: Never Smoker  . Smokeless tobacco: Never Used  Vaping Use  . Vaping Use: Never used  Substance and Sexual Activity  . Alcohol  use: No  . Drug use: No  . Sexual activity: Never  Other Topics Concern  . Not on file  Social History Narrative   Lives at home alone   Right handed   Caffeine: sometimes   Social Determinants of Health   Financial Resource Strain: Not on file  Food Insecurity: Not on file  Transportation Needs: Not on file  Physical Activity: Not on file  Stress: Not on file  Social Connections: Not on file   Family History  Problem Relation Age of Onset  . Diabetes Father   . Seizures Brother   . Alzheimer's disease Sister   . Cancer Sister   . Breast cancer Sister   . Cancer Sister   . Seizures Daughter   . Seizures Son   . Seizures Granddaughter   . Ovarian cancer Granddaughter   . Lung cancer Nephew   . Breast cancer Niece   . Breast cancer Niece   . Brain cancer Niece    Scheduled Meds: . pantoprazole  40 mg Oral Daily  . sodium chloride flush  3 mL Intravenous Q12H  . sodium chloride flush  3 mL Intravenous Q12H   Continuous Infusions: . sodium chloride     PRN Meds:.sodium chloride, acetaminophen **OR** acetaminophen, bisacodyl, glycopyrrolate, ondansetron, polyvinyl alcohol, sodium chloride flush Medications Prior to Admission:  Prior to Admission medications   Medication Sig Start Date End Date Taking? Authorizing Provider  bisacodyl (DULCOLAX) 10 MG suppository Place 1 suppository (10 mg total) rectally daily as needed for moderate constipation. 05/11/20   Lattie Haw, MD  furosemide (LASIX) 40 MG tablet Take 1 tablet (40 mg total) by mouth daily. 05/25/20 08/23/20  Lorretta Harp, MD  glycopyrrolate (ROBINUL) 0.2 MG/ML injection Inject 1 mL (0.2 mg total) into the skin every 4 (four) hours as needed (excessive secretions). 05/11/20   Lattie Haw, MD  glycopyrrolate (ROBINUL) 1 MG tablet Take 1 tablet (1 mg total) by mouth every 4 (four) hours as needed (excessive secretions). 05/11/20   Lattie Haw, MD  haloperidol (HALDOL) 0.5 MG tablet Take 1 tablet (0.5 mg total)  by mouth every 4 (four) hours as needed for agitation (or delirium). 05/11/20   Lattie Haw, MD  LORazepam (ATIVAN) 1 MG tablet Take 1 tablet (1 mg total) by mouth every 4 (four) hours as needed for anxiety. 05/11/20   Lattie Haw, MD  metoprolol succinate (TOPROL XL) 25 MG 24 hr tablet Take 0.5 tablets (12.5 mg total) by mouth daily. 05/25/20   Lorretta Harp, MD  ondansetron (ZOFRAN-ODT) 4 MG disintegrating tablet Take 1 tablet (4 mg total) by mouth every 6 (six) hours as needed for nausea. 05/11/20   Lattie Haw, MD  pantoprazole (PROTONIX) 40 MG tablet Take 1 tablet (40 mg total) by mouth daily. 12/30/19   Elgergawy, Silver Huguenin, MD  polyethylene glycol (MIRALAX / GLYCOLAX) 17 g packet Take 17 g by mouth 2 (two) times daily. 05/11/20   Lattie Haw, MD  polyvinyl alcohol (LIQUIFILM TEARS) 1.4 % ophthalmic solution Place 1 drop into both eyes 4 (four) times daily as needed for dry eyes. 05/11/20   Lattie Haw, MD  sertraline (ZOLOFT) 100 MG tablet Take 100 mg by mouth daily.  12/04/17   [provider]   Allergies  Allergen Reactions  . Codeine Nausea And Vomiting and Other (See Comments)    Dizziness   Review of Systems  Unable to perform ROS: Dementia    Physical Exam Vitals and nursing note reviewed.  Constitutional:      General: She is not in acute distress.    Appearance: She is cachectic. She is ill-appearing.  Pulmonary:     Effort: No respiratory distress.  Skin:    General: Skin is cool and dry.  Neurological:     Mental Status: She is lethargic.     Motor: Weakness present.  Psychiatric:        Behavior: Behavior is cooperative.     Vital Signs: BP 93/72 (BP Location: Left Arm)   Pulse (!) 101   Temp 97.7 F (36.5 C) (Oral)   Resp (!) 24   Ht 5' 6"  (1.676 m)   Wt 79.9 kg   SpO2 92%   BMI 28.43 kg/m  Pain Scale: 0-10   Pain Score: 0-No pain   SpO2: SpO2: 92 % O2 Device:SpO2: 92 % O2 Flow Rate: .   IO: Intake/output summary:    Intake/Output Summary (Last 24 hours) at 06/13/2020 1449 Last data filed at 06/13/2020 1000 Gross per 24 hour  Intake 3 ml  Output --  Net 3 ml    LBM: Last BM Date: 06/11/20 Baseline  Weight: Weight: 85.3 kg Most recent weight: Weight: 79.9 kg     Palliative Assessment/Data: PPS 10 to 20%     Time In: 1500 Time Out: 1615 Time Total: 75 minutes  Greater than 50%  of this time was spent counseling and coordinating care related to the above assessment and plan.  Signed by: Lin Landsman, NP   Please contact Palliative Medicine Team phone at [phone] for questions and concerns.  For individual provider: See Amion  *Portions of this note are a verbal dictation therefore any spelling and/or grammatical errors are due to the "[place]" system interpretation.

## 2020-06-13 NOTE — Progress Notes (Signed)
PROGRESS NOTE    [name] [name]  ASN:[id number] DOB: 1928-07-09 DOA: 06/10/2020 PCP: Merrilee Seashore, MD    Brief Narrative:  Mrs. Devita was admitted to the hospital with a working diagnosis of acute on chronic diastolic heart failure decompensation.  87 year old female with a past medical history for dementia, atrial fibrillation/flutter, hypertension, heart failure, severe mitral regurgitation, severe aortic stenosis, dyslipidemia, rheumatoid arthritis, chronic kidney disease and malnutrition who presented with bloody stools.  Patient lives at a nursing facility where she was noted to have bloody stools, several episodes associated with nausea and vomiting that prompted her to be brought to the hospital.  On her initial physical examination blood pressure 89/60, 103/84, heart rate 116, respiratory 24, temperature 7.5, oxygen saturation 93%, her lungs had rales bilaterally, no wheezing, heart S1-S2, present, irregularly irregular, tachycardic, positive systolic murmur, abdomen soft, positive lower extremity edema.  Sodium 138, potassium 4.0, chloride 100, bicarb 19, glucose 112, BUN 35, creatinine 3.26, BNP 1382, high sensitive troponin 297-320, white count 9.1, hemoglobin 13.0, hematocrit 41.5, platelets 112. SARS COVID-19 negative.  Chest radiograph with cardiomegaly, positive hilar vascular congestion, bilateral interstitial infiltrates, predominantly at bases, small bilateral pleural effusions.  EKG 117 bpm, normal axis, measured QT 400, atrial flutter with variable block, no significant ST segment or T wave changes.  Low voltage, poor R wave progression.  Patient with end stage heart failure, acutely decompensated. Not surgical candidate.  Continue palliative care, no aggressive interventions.   Assessment & Plan:   Principal Problem:   Acute on chronic combined systolic and diastolic CHF (congestive heart failure) (HCC) Active Problems:   Essential hypertension    Hyperlipidemia   Dementia (HCC)   Atrial flutter (HCC)   Acute kidney injury ([place])   Demand ischemia (HCC)   Lower GI bleed   Elevated troponin   1. Acute on chronic decompensation of systolic heart failure. Patient with persistent edema and today with acute metabolic encephalopathy. Persistent lower extremity edema with cold lower extremities. Low output heart failure.    End stage heart failure due to aortic stenosis, she is not candidate for surgical intervention.   Not able to use diuresis due to hypotension, not candidate for advance therapies, including inotropic support.   Continue palliative care, to prevent suffering.   2. Acute lower GI bleed. Possibly related to aortic stenosis, will continue supportive medical care. Not candidate for PRBC transfusion or invasive diagnostic testing.   3. AKI on CKD stage 4. Low output heart failure, poor prognosis.   4. Atrial flutter with variable block. Limited pharmacologic options due to hypotension, will add as needed IV metoprolol for now for heart rate 130 or greater. Hold on anticoagulation due to bleeding.   5. Dementia. Supportive medical care.    Patient continue to be at high risk for worsening hypotension   Status is: Observation  The patient will require care spanning > 2 midnights and should be moved to inpatient because: Inpatient level of care appropriate due to severity of illness  Dispo: The patient is from: Home              Anticipated d/c is to: to be determined               Patient currently is not medically stable to d/c.   Difficult to place patient No    DVT prophylaxis: scd   Code Status:   DNR   Family Communication:  I spoke with patient's daughter and son at the bedside, we  talked in detail about patient's condition, plan of care and prognosis and all questions were addressed.      Consultants:   Cardiology   Palliative care.        Subjective: Patient is lethargic and poorly  responsive, not in apparent pain, positive dyspnea at rest.   Objective: Vitals:   06/13/20 0430 06/13/20 0500 06/13/20 0540 06/13/20 1000  BP: (!) 83/63 (!) 87/51 91/75 93/72   Pulse:      Resp: (!) 21 (!) 21 (!) 24   Temp:      TempSrc:      SpO2:   92%   Weight:      Height:        Intake/Output Summary (Last 24 hours) at 06/13/2020 1451 Last data filed at 06/13/2020 1000 Gross per 24 hour  Intake 3 ml  Output --  Net 3 ml   Filed Weights   06/18/2020 2139 06/13/20 0118  Weight: 85.3 kg 79.9 kg    Examination:   General: deconditioned and ill looking appearing  Neurology:somnolent E ENT: positive pallor, no icterus, oral mucosa dry Cardiovascular: Positive JVD. S1-S2 present, rhythmic, no gallops, rubs, or murmurs. +++ pitting lower extremity edema. Pulmonary: positive breath sounds bilaterally, decreased breath sounds and increased work of breathing Gastrointestinal. Abdomen soft and non tender Skin. No rashes Musculoskeletal: no joint deformities     Data Reviewed: I have personally reviewed following labs and imaging studies  CBC: Recent Labs  Lab 06/03/2020 2143 06/08/2020 2257 06/13/20 0045  WBC 9.1  --  8.8  NEUTROABS 7.2  --   --   HGB 13.0 15.0 12.2  HCT 41.5 44.0 38.8  MCV 95.2  --  94.6  PLT 112*  --  130*   Basic Metabolic Panel: Recent Labs  Lab 06/08/2020 2143 05/25/2020 2257 06/13/20 0045  NA 138 138 139  K 4.0 3.8 4.1  CL 100 102 102  CO2 19*  --  19*  GLUCOSE 112* 106* 111*  BUN 35* 38* 35*  CREATININE 3.26* 3.00* 3.32*  CALCIUM 8.8*  --  8.6*  MG  --   --  1.9   GFR: Estimated Creatinine Clearance: 12.2 mL/min (A) (by C-G formula based on SCr of 3.32 mg/dL (H)). Liver Function Tests: Recent Labs  Lab 05/25/2020 2143 06/13/20 0045  AST 45* 45*  ALT 22 21  ALKPHOS 47 45  BILITOT 3.9* 3.5*  PROT 5.7* 5.3*  ALBUMIN 2.6* 2.4*   Recent Labs  Lab 05/26/2020 2143  LIPASE 26   No results for input(s): AMMONIA in the last 168  hours. Coagulation Profile: No results for input(s): INR, PROTIME in the last 168 hours. Cardiac Enzymes: No results for input(s): CKTOTAL, CKMB, CKMBINDEX, TROPONINI in the last 168 hours. BNP (last 3 results) No results for input(s): PROBNP in the last 8760 hours. HbA1C: No results for input(s): HGBA1C in the last 72 hours. CBG: No results for input(s): GLUCAP in the last 168 hours. Lipid Profile: No results for input(s): CHOL, HDL, LDLCALC, TRIG, CHOLHDL, LDLDIRECT in the last 72 hours. Thyroid Function Tests: No results for input(s): TSH, T4TOTAL, FREET4, T3FREE, THYROIDAB in the last 72 hours. Anemia Panel: No results for input(s): VITAMINB12, FOLATE, FERRITIN, TIBC, IRON, RETICCTPCT in the last 72 hours.    Radiology Studies: I have reviewed all of the imaging during this hospital visit personally     Scheduled Meds: . pantoprazole  40 mg Oral Daily  . sodium chloride flush  3 mL Intravenous Q12H  .  sodium chloride flush  3 mL Intravenous Q12H   Continuous Infusions: . sodium chloride       LOS: 0 days        Romond Pipkins Gerome Apley, MD

## 2020-06-14 DIAGNOSIS — F1027 Alcohol dependence with alcohol-induced persisting dementia: Secondary | ICD-10-CM | POA: Diagnosis not present

## 2020-06-14 DIAGNOSIS — K922 Gastrointestinal hemorrhage, unspecified: Secondary | ICD-10-CM | POA: Diagnosis not present

## 2020-06-14 DIAGNOSIS — R451 Restlessness and agitation: Secondary | ICD-10-CM

## 2020-06-14 DIAGNOSIS — I5043 Acute on chronic combined systolic (congestive) and diastolic (congestive) heart failure: Secondary | ICD-10-CM | POA: Diagnosis not present

## 2020-06-14 DIAGNOSIS — I248 Other forms of acute ischemic heart disease: Secondary | ICD-10-CM | POA: Diagnosis not present

## 2020-06-14 DIAGNOSIS — I4892 Unspecified atrial flutter: Secondary | ICD-10-CM | POA: Diagnosis not present

## 2020-06-14 DIAGNOSIS — E7800 Pure hypercholesterolemia, unspecified: Secondary | ICD-10-CM

## 2020-06-14 DIAGNOSIS — N179 Acute kidney failure, unspecified: Secondary | ICD-10-CM | POA: Diagnosis not present

## 2020-06-14 MED ORDER — FENTANYL BOLUS VIA INFUSION
10.0000 ug | INTRAVENOUS | Status: DC | PRN
Start: 2020-06-14 — End: 2020-06-15
  Administered 2020-06-15: 10 ug via INTRAVENOUS
  Filled 2020-06-14: qty 10

## 2020-06-14 MED ORDER — FENTANYL 2500MCG IN NS 250ML (10MCG/ML) PREMIX INFUSION
10.0000 ug/h | INTRAVENOUS | Status: DC
  Administered 2020-06-14: 10 ug/h via INTRAVENOUS
  Administered 2020-06-15: 20 ug/h via INTRAVENOUS
  Administered 2020-06-15: 30 ug/h via INTRAVENOUS
  Filled 2020-06-14: qty 250

## 2020-06-14 MED ORDER — BIOTENE DRY MOUTH MT LIQD: 15.0000 mL | OROMUCOSAL | Status: DC | PRN

## 2020-06-14 NOTE — Progress Notes (Signed)
Daily Progress Note   Patient Name: [name] [name]       Date: 06/14/2020 DOB: Jul 16, 1928  Age: 87 y.o. MRN#: [id number] Attending Physician: Tawni Millers Primary Care Physician: Merrilee Seashore, MD Admit Date: 06/06/2020  Reason for Consultation/Follow-up: Non pain symptom management, Pain control, Psychosocial/spiritual support and Terminal Care  Subjective: Chart review performed.  Received updates from primary RN -no acute concerns.  RN states patient does not open her eyes, is not eating or drinking, and does not like the Biotene mouth swab.  RN states patient has just been given Ativan for restlessness.  Went to visit patient at bedside -son and granddaughter were present.  Patient was lying in bed - she does not wake to voice or gentle touch. No signs or non-verbal gestures of pain or discomfort noted. No respiratory distress, increased work of breathing, or secretions noted.  Patient does not show signs of restlessness/agitation.  She is on 2 L O2 nasal cannula.  Therapeutic listening and emotional support provided to family as they reflect on the events of the past several days.  We discussed oxygen as a life prolonging intervention during the actively dying process as well that it can become uncomfortable and cause dryness.  Discussed weaning supplemental oxygen to off and supporting symptoms with comfort medications -family at bedside request that daughter/[name] be present to make this decision.  They state they will talk to her about this when she arrives later this afternoon.  Provided "Gone From My Sight" book.  Discussed with family that patient still seems unstable for transfer -recommendation given to continue end-of-life care here in house and family are  agreeable.  Family expressed appreciation for PMT support and visit today.  All questions and concerns addressed. Encouraged to call with questions and/or concerns. PMT card provided.  Discussed with RN weaning oxygen to off if family are agreeable.  Length of Stay: 1  Current Medications: Scheduled Meds:  . antiseptic oral rinse  15 mL Topical BID  . sodium chloride flush  3 mL Intravenous Q12H    Continuous Infusions: . sodium chloride      PRN Meds: sodium chloride, acetaminophen **OR** acetaminophen, bisacodyl, diphenhydrAMINE, fentaNYL (SUBLIMAZE) injection, glycopyrrolate **OR** glycopyrrolate **OR** glycopyrrolate, haloperidol **OR** haloperidol **OR** haloperidol lactate, LORazepam **OR** LORazepam **OR** LORazepam, ondansetron **OR** ondansetron (ZOFRAN) IV, polyvinyl  alcohol, sodium chloride flush  Physical Exam Vitals and nursing note reviewed.  Constitutional:      General: She is not in acute distress.    Appearance: She is cachectic. She is ill-appearing.  Pulmonary:     [place]: No respiratory distress.  Skin:    General: Skin is cool and dry.  Neurological:     Mental Status: She is unresponsive.     Motor: Weakness present.  Psychiatric:        Speech: She is noncommunicative.             Vital Signs: BP (!) 86/69 (BP Location: Left Arm)   Pulse 70   Temp 98.4 F (36.9 C) (Oral)   Resp 15   Ht 5\' 6"  (1.676 m)   Wt 79.9 kg   SpO2 99%   BMI 28.43 kg/m  SpO2: SpO2: 99 % O2 Device:   O2 Flow Rate:    Intake/output summary:   Intake/Output Summary (Last 24 hours) at 06/14/2020 1109 Last data filed at 06/14/2020 0755 Gross per 24 hour  Intake --  Output 0 ml  Net 0 ml   LBM: Last BM Date: 06/11/20 Baseline Weight: Weight: 85.3 kg Most recent weight: Weight: 79.9 kg       Palliative Assessment/Data: PPS 10%    Flowsheet Rows   Flowsheet Row Most Recent Value  Intake Tab   Referral Department Hospitalist  Unit at Time of Referral  Med/Surg Unit  Palliative Care Primary Diagnosis Neurology  Date Notified 06/13/20  Palliative Care Type Return patient Palliative Care  Reason for referral Clarify Goals of Care  Date of Admission 05/21/2020  Date first seen by Palliative Care 06/13/20  # of days Palliative referral response time 0 Day(s)  # of days IP prior to Palliative referral 1  Clinical Assessment   Psychosocial & Spiritual Assessment   Palliative Care Outcomes   Patient/Family meeting held? Yes  Who was at the meeting? Patient, daughter, 2 sons, and granddaughter  Palliative Care Outcomes Improved pain interventions, Improved non-pain symptom therapy, Clarified goals of care, Counseled regarding hospice, Provided end of life care assistance, Provided psychosocial or spiritual support, Changed to focus on comfort  Patient/Family wishes: Interventions discontinued/not started  Mechanical Ventilation, Antibiotics, Tube feedings/TPN, BiPAP, Hemodialysis, NIPPV, Trach, Transfusion, Vasopressors, PEG, Transfer out of ICU      Patient Active Problem List   Diagnosis Date Noted  . Demand ischemia ([place]) 06/13/2020  . Lower GI bleed 06/13/2020  . Heart failure ([place]) 06/13/2020  . Elevated troponin   . Acute on chronic combined systolic and diastolic CHF (congestive heart failure) ([place]) 05/25/2020  . Comfort measures only status   . Aortic root dilatation ([place]) 05/02/2020  . Weakness   . Acute kidney injury ([place])   . Decreased urine output   . Goals of care, counseling/discussion   . Palliative care by specialist   . Atrial flutter ([place]) 04/29/2020  . Traumatic Soft tissue injury of hip, left, initial encounter 04/29/2020  . Hypomagnesemia 04/29/2020  . Protein calorie malnutrition ([place]) 04/29/2020  . Frailty syndrome in geriatric patient 04/29/2020  . TSH elevation 04/29/2020  . Dehydration 04/29/2020  . Syncope, possible 04/29/2020  . Hypophosphatemia 04/29/2020  . Fall 04/28/2020  . Dementia ([place]) 01/27/2020   . Pneumonia due to COVID-19 virus 12/23/2019  . Acute hypoxemic respiratory failure ([place]) 12/23/2019  . Hypokalemia with generalized weakness 12/23/2019  . Transaminitis 12/23/2019  . Slow heart rate 03/13/2019  . Mild neurocognitive disorder 02/26/2019  .  [place] 01/20/2019  . Porokeratosis 09/02/2018  . Corns and callosities 09/02/2018  . Elevated CK   . Pressure injury of skin 08/27/2018  . PAF (paroxysmal atrial fibrillation) ([place]) 08/27/2018  . Hypernatremia   . Rhabdomyolysis 08/26/2018  . Normocytic anemia 08/26/2018  . Renal insufficiency 08/26/2018  . Community acquired pneumonia 01/13/2013  . Sepsis, Rule Out 01/13/2013  . Hyponatremia 01/13/2013  . Hypochloremia 01/13/2013  . Rheumatoid arthritis ([place]) 01/13/2013  . Premature atrial contractions 07/22/2012  . Hyperlipidemia 07/22/2012  . Carotid artery bruit 07/22/2012  . Moderate aortic stenosis 07/22/2012  . Chest pain   . Essential hypertension     Palliative Care Assessment & Plan   Patient Profile: 87 y.o. female  with past medical history of dementia, atrial flutter, atrial fibrillation, hypertension, heart failure, severe mitral regurgitation, severe aortic stenosis, malnutrition, and CKD presented to [name] ED on 06/03/2020 from [place] with family reports of blood in patient's stool along with nausea and vomiting.  Patient was admitted on 05/23/2020 with acute on chronic CHF, AKI on CKD4, hypotension, and GI bleed.   Cardiology was consulted due to patient's extensive cardiac history and hypotension-they felt patient to be in a decompensated state and feel comfort measures are appropriate at this time.  GI was also consulted for GI bleed -family were clear they did not wish to pursue invasive interventions such as colonoscopy.   Of note, patient was recently admitted from 3/10 - 05/11/2020 for hypokalemia and generalized weakness.  PMT saw patient and family during this admission.  ED  Course:Vital signs in ED significant for blood pressure soft in the 56L to 875I systolic, heart rate in the 100s, respirations in the 20s, temperature 97.5. Lab work-up showed CMP with creatinine of 3.2 up from baseline 2.5. Calcium 8.8, protein 5.7, albumin 2.2, AST 645, T bili 3.9. FOBT was positive, lipase negative. CBC within normal limits. Respiratory panel flu COVID pending. Troponin came back elevated at 297 and BNP elevated at 1382. Patient was typed and screened in the ED. Chest x-ray showed right basilar consolidation with small pleural effusion. Cardiomegaly. Central vascular congestion. A message was sent to GI by the EDP and I consulted cardiology for further assistance.  Assessment: Acute on chronic combined systolic and diastolic CHF Acute cardiogenic shock Dementia Atrial flutter AKI on CKD stage IV Demand ischemia Lower GI bleed Elevated troponin Terminal care  Recommendations/Plan: Continue full comfort measures Continue DNR/DNI as previously documented Patient does not seem stable for transfer to residential hospice at this time - anticipate hospital death Continue current comfort focused medication regimen - patient appears comfortable Nursing to provide frequent assessments and administer PRN medications as clinically necessary to ensure [name] comfort PMT will continue to follow holistically   Goals of Care and Additional Recommendations: Limitations on Scope of Treatment: Full Comfort Care  Code Status:    Code Status Orders  (From admission, onward)         Start     Ordered   06/13/20 1544  Do not attempt resuscitation (DNR)  Continuous       Question Answer Comment  In the event of cardiac or respiratory ARREST Do not call a "code blue"   In the event of cardiac or respiratory ARREST Do not perform Intubation, CPR, defibrillation or ACLS   In the event of cardiac or respiratory ARREST Use medication by any route, position, wound care, and other  measures to relive pain and suffering. May use oxygen, suction  and manual treatment of airway obstruction as needed for comfort.      06/13/20 1553        Code Status History    Date Active Date Inactive Code Status Order ID Comments User Context   06/13/2020 0017 06/13/2020 1553 DNR [id number]  Marcelyn Bruins, MD ED   06/11/2020 2252 06/13/2020 0017 DNR [id number]  Drenda Freeze, MD ED   05/05/2020 0922 05/11/2020 1815 DNR [id number]  Norberta Keens, PA-C Inpatient   04/28/2020 2135 05/05/2020 0922 DNR [id number]  Rise Patience, DO ED   12/23/2019 0533 12/29/2019 2216 Full Code [id number]  Shela Leff, MD ED   08/26/2018 1714 08/28/2018 2107 Full Code [id number]  Norval Morton, MD ED   01/13/2013 1743 01/16/2013 1400 Full Code [id number]  Cristal Ford, DO ED   Advance Care Planning Activity    Advance Directive Documentation   Flowsheet Row Most Recent Value  Type of Advance Directive Out of facility DNR (pink MOST or yellow form)  Pre-existing out of facility DNR order (yellow form or pink MOST form) Yellow form placed in chart (order not valid for inpatient use)  "MOST" Form in Place? --      Prognosis:  Hours - Days  Discharge Planning: Anticipated Hospital Death  Care plan was discussed with primary RN, patient's family  Thank you for allowing the Palliative Medicine Team to assist in the care of this patient.   Total Time  27 minutes Prolonged Time Billed  no       Greater than 50%  of this time was spent counseling and coordinating care related to the above assessment and plan.  Lin Landsman, NP  Please contact Palliative Medicine Team phone at [phone] for questions and concerns.   *Portions of this note are a verbal dictation therefore any spelling and/or grammatical errors are due to the "[employer] [place] [employer]" system interpretation.

## 2020-06-14 NOTE — Progress Notes (Signed)
PROGRESS NOTE    [name] [name]  YHC:[id number] DOB: 08/15/1928 DOA: 06/03/2020 PCP: Merrilee Seashore, MD    Brief Narrative:  Mrs. Donaldson was admitted to the hospital with a working diagnosis of acute on chronic systolic heart failure decompensation.  87 year old female with a past medical history for dementia, atrial fibrillation/flutter, hypertension, heart failure, severe mitral regurgitation, severe aortic stenosis, dyslipidemia, rheumatoid arthritis, chronic kidney disease and malnutrition who presented with bloody stools.  Patient lives at a nursing facility where she was noted to have bloody stools, several episodes associated with nausea and vomiting that prompted her to be brought to the hospital.  On her initial physical examination blood pressure 89/60, 103/84, heart rate 116, respiratory 24, temperature 7.5, oxygen saturation 93%, her lungs had rales bilaterally, no wheezing, heart S1-S2, present, irregularly irregular, tachycardic, positive systolic murmur, abdomen soft, positive lower extremity edema.  Sodium 138, potassium 4.0, chloride 100, bicarb 19, glucose 112, BUN 35, creatinine 3.26, BNP 1382, high sensitive troponin 297-320, white count 9.1, hemoglobin 13.0, hematocrit 41.5, platelets 112. SARS COVID-19 negative.  Chest radiograph with cardiomegaly, positive hilar vascular congestion, bilateral interstitial infiltrates, predominantly at bases, small bilateral pleural effusions.  EKG 117 bpm, normal axis, measured QT 400, atrial flutter with variable block, no significant ST segment or T wave changes.  Low voltage, poor R wave progression.  Patient with end stage heart failure, acutely decompensated. Not surgical candidate. Not able to tolerate medical therapy due to hypotension.   Continue palliative care, no aggressive interventions.     Assessment & Plan:   Principal Problem:   Acute on chronic combined systolic and diastolic CHF (congestive heart  failure) (HCC) Active Problems:   Essential hypertension   Hyperlipidemia   Dementia (HCC)   Atrial flutter (H[facility])   Acute kidney injury ([place])   Demand ischemia (HCC)   Lower GI bleed   Elevated troponin   Heart failure ([place])   1. Acute on chronic decompensation of systolic heart failure in the setting of moderate to severe aortic stenosis. Low output heart failure. Acute cardiogenic shock.  EF 40 to 45% LV, with global hypokinesis, moderate to severe aortic stenosis, moderate to severe mitral valve regurgitation. Right and left atrium with severe dilatation.   Unable to diurese due to hypotension, patient with poor prognosis, she is not candidate for advanced invasive therapies   Continue with comfort care. Patient with episodic agitation per her family at the bedside. Will add fentanyl drip for comfort care.   2. Acute lower GI bleed. No signs of active bleeding, continue supportive care.   3. AKI on CKD stage 4. Worsening renal function, acute cardio renal syndrome. Continue with palliative care. Will add fentanyl for comfort care.   4. Atrial flutter with variable block. Limited pharmacologic options in the setting of heart failure and hypotension, and cardiogenic shock. Continue with palliative care.   5. Dementia, metabolic encephalopathjy. Patient with sedation poorly responsive, positive episodic agitation, continue with palliative care.     Patient with imminent death   Status is: Inpatient  Remains inpatient appropriate because:Inpatient level of care appropriate due to severity of illness   Dispo: The patient is from: Home              Anticipated d/c is to: imminent death               Patient currently is not medically stable to d/c.   Difficult to place patient No  DVT prophylaxis: None   Code  Status:    DNR   Family Communication:  I spoke with patient's daughter at the bedside, we talked in detail about patient's condition, plan of care and prognosis  and all questions were addressed.     Consultants:   Cardiology   Palliative care    Subjective: Patient is sedated, most information from her daughter at the bedside, patient had episodic agitation.  Objective: Vitals:   06/13/20 1000 06/13/20 1300 06/13/20 2001 06/14/20 0755  BP: 93/72 90/61 (!) 82/71 (!) 86/69  Pulse:  (!) 56 83 70  Resp:  20 13 15   Temp: 98.7 F (37.1 C) 98.7 F (37.1 C) 97.9 F (36.6 C) 98.4 F (36.9 C)  TempSrc: Axillary Axillary Oral Oral  SpO2:  90% 90% 99%  Weight:      Height:        Intake/Output Summary (Last 24 hours) at 06/14/2020 1336 Last data filed at 06/14/2020 0755 Gross per 24 hour  Intake 0 ml  Output 0 ml  Net 0 ml   Filed Weights   05/30/2020 2139 06/13/20 0118  Weight: 85.3 kg 79.9 kg    Examination:   General: patient very weak and deconditioned,  Neurology: somnolent and hyporeactive  E ENT: mild pallor, no icterus, oral mucosa moist Cardiovascular: No JVD. S1-S2 present, rhythmic, no gallops, rubs, or murmurs. +++ pitting bilateral lower extremity edema. Pulmonary: increased work of breathing Gastrointestinal. Abdomen soft and non tender Skin. No rashes Musculoskeletal: no joint deformities     Data Reviewed: I have personally reviewed following labs and imaging studies  CBC: Recent Labs  Lab 06/17/2020 2143 06/18/2020 2257 06/13/20 0045  WBC 9.1  --  8.8  NEUTROABS 7.2  --   --   HGB 13.0 15.0 12.2  HCT 41.5 44.0 38.8  MCV 95.2  --  94.6  PLT 112*  --  564*   Basic Metabolic Panel: Recent Labs  Lab 06/08/2020 2143 06/14/2020 2257 06/13/20 0045  NA 138 138 139  K 4.0 3.8 4.1  CL 100 102 102  CO2 19*  --  19*  GLUCOSE 112* 106* 111*  BUN 35* 38* 35*  CREATININE 3.26* 3.00* 3.32*  CALCIUM 8.8*  --  8.6*  MG  --   --  1.9   GFR: Estimated Creatinine Clearance: 12.2 mL/min (A) (by C-G formula based on SCr of 3.32 mg/dL (H)). Liver Function Tests: Recent Labs  Lab 06/08/2020 2143 06/13/20 0045  AST  45* 45*  ALT 22 21  ALKPHOS 47 45  BILITOT 3.9* 3.5*  PROT 5.7* 5.3*  ALBUMIN 2.6* 2.4*   Recent Labs  Lab 06/10/2020 2143  LIPASE 26   No results for input(s): AMMONIA in the last 168 hours. Coagulation Profile: No results for input(s): INR, PROTIME in the last 168 hours. Cardiac Enzymes: No results for input(s): CKTOTAL, CKMB, CKMBINDEX, TROPONINI in the last 168 hours. BNP (last 3 results) No results for input(s): PROBNP in the last 8760 hours. HbA1C: No results for input(s): HGBA1C in the last 72 hours. CBG: No results for input(s): GLUCAP in the last 168 hours. Lipid Profile: No results for input(s): CHOL, HDL, LDLCALC, TRIG, CHOLHDL, LDLDIRECT in the last 72 hours. Thyroid Function Tests: No results for input(s): TSH, T4TOTAL, FREET4, T3FREE, THYROIDAB in the last 72 hours. Anemia Panel: No results for input(s): VITAMINB12, FOLATE, FERRITIN, TIBC, IRON, RETICCTPCT in the last 72 hours.    Radiology Studies: I have reviewed all of the imaging during this hospital visit personally  Scheduled Meds: . antiseptic oral rinse  15 mL Topical BID  . sodium chloride flush  3 mL Intravenous Q12H   Continuous Infusions: . sodium chloride       LOS: 1 day        Marra Fraga Gerome Apley, MD

## 2020-06-14 NOTE — Progress Notes (Signed)
This chaplain responded to the consult for Pt. prayer and continued relationship through the Palliative Care team.  The Pt. daughter-[name] is at the bedside and the Pt. son-Raymond joined the family later in the visit.  [name] agreed with the chaplain's statement "the Pt. appears to be resting comfortably."  The chaplain listened as [name] reconnected with the chaplain from the Pt. previous visit and story.    The chaplain's invitation for prayer and F/U spiritual care was accepted by the family.

## 2020-06-14 NOTE — Progress Notes (Signed)
Nutrition Brief Note  Chart reviewed. Pt now transitioning to comfort care.  No further nutrition interventions planned at this time.  Please re-consult as needed.   Natividad Schlosser W, RD, LDN, CDCES Registered Dietitian II Certified Diabetes Care and Education Specialist Please refer to [facility] for RD and/or RD on-call/weekend/after hours pager   

## 2020-06-15 DIAGNOSIS — K922 Gastrointestinal hemorrhage, unspecified: Secondary | ICD-10-CM | POA: Diagnosis not present

## 2020-06-15 DIAGNOSIS — I5043 Acute on chronic combined systolic (congestive) and diastolic (congestive) heart failure: Secondary | ICD-10-CM | POA: Diagnosis not present

## 2020-06-15 DIAGNOSIS — N179 Acute kidney failure, unspecified: Secondary | ICD-10-CM | POA: Diagnosis not present

## 2020-06-15 DIAGNOSIS — I248 Other forms of acute ischemic heart disease: Secondary | ICD-10-CM | POA: Diagnosis not present

## 2020-06-15 MED ORDER — FENTANYL BOLUS VIA INFUSION
10.0000 ug | INTRAVENOUS | Status: DC | PRN
  Administered 2020-06-15: 10 ug via INTRAVENOUS
  Filled 2020-06-15: qty 20

## 2020-06-16 NOTE — Progress Notes (Signed)
200 mL of Fentanyl from IV bag wasted in PYXIS.

## 2020-06-19 NOTE — Progress Notes (Signed)
PROGRESS NOTE    [name] [name]  TGY:[id number] DOB: 06-Aug-1928 DOA: 06/07/2020 PCP: Merrilee Seashore, MD    Brief Narrative:  Mrs. Lasure was admitted to the hospital with a working diagnosis of acute on chronic systolic heart failure decompensation.  87 year old female with a past medical history for dementia, atrial fibrillation/flutter, hypertension, heart failure, severe mitral regurgitation, severe aortic stenosis, dyslipidemia, rheumatoid arthritis, chronic kidney disease and malnutrition who presented with bloody stools.  Patient lives at a nursing facility where she was noted to have bloody stools, several episodes associated with nausea and vomiting that prompted her to be brought to the hospital.   Patient with end stage heart failure, acutely decompensated. Not surgical candidate. Not able to tolerate medical therapy due to hypotension.    Assessment & Plan:   Acute on chronic systolic CHF/Moderate to severe aortic stenosis/Acute cardiogenic shock.  EF 40 to 45% LV, with global hypokinesis, moderate to severe aortic stenosis, moderate to severe mitral valve regurgitation. Right and left atrium with severe dilatation.  Unable to diurese due to hypotension, patient with poor prognosis.  Patient was seen by cardiology.  She was not considered a candidate for advanced invasive treatments.  After further discussions patient was transitioned over to comfort care. Palliative care is following.  Acute lower GI bleed Was noted to have hematochezia.  GI was consulted.  However patient was subsequently transitioned to comfort care.  AKI on CKD stage 4.  Worsening renal function, acute cardio renal syndrome.  Atrial flutter with variable block Limited pharmacologic options in the setting of heart failure and hypotension, and cardiogenic shock.  Dementia, metabolic encephalopathy Patient with sedation poorly responsive, positive episodic agitation, continue with palliative  care.    Goals of care/comfort care Comfort care measures are being followed.  Anticipate in-hospital death.  DVT prophylaxis: None   Code Status:    DNR   Family Communication: Discussed with patient and family at bedside Disposition: Anticipate in-hospital death.  Status is: Inpatient  Remains inpatient appropriate because:Anticipate in-hospital death   Dispo: The patient is from: Home              Anticipated d/c is to: Anticipate in-hospital death              Patient currently is not medically stable to d/c.   Difficult to place patient No         Consultants:   Cardiology   Palliative care    Subjective: Patient is still comfortable.  Occasional groaning is noted.  Family raised concern for drainage from under her left breast area.  Objective: Vitals:   06/13/20 1300 06/13/20 2001 06/14/20 0755 06/14/20 2159  BP: 90/61 (!) 82/71 (!) 86/69 94/69  Pulse: (!) 56 83 70 (!) 118  Resp: 20 13 15 19   Temp: 98.7 F (37.1 C) 97.9 F (36.6 C) 98.4 F (36.9 C) 98.5 F (36.9 C)  TempSrc: Axillary Oral Oral Oral  SpO2: 90% 90% 99% 93%  Weight:      Height:        Intake/Output Summary (Last 24 hours) at 07/05/2020 1259 Last data filed at 06/14/2020 1831 Gross per 24 hour  Intake 0 ml  Output --  Net 0 ml   Filed Weights   05/24/2020 2139 06/13/20 0118  Weight: 85.3 kg 79.9 kg    Examination:   Patient has a wound under her left breast which is oozing.  Comfort measures emphasized to family who agree.  We will minimize handling  of that area as much as possible as it appears to cause her pain.    Data Reviewed: I have personally reviewed following labs and imaging studies  CBC: Recent Labs  Lab 06/18/2020 2143 05/28/2020 2257 06/13/20 0045  WBC 9.1  --  8.8  NEUTROABS 7.2  --   --   HGB 13.0 15.0 12.2  HCT 41.5 44.0 38.8  MCV 95.2  --  94.6  PLT 112*  --  161*   Basic Metabolic Panel: Recent Labs  Lab 06/11/2020 2143 06/02/2020 2257 06/13/20 0045   NA 138 138 139  K 4.0 3.8 4.1  CL 100 102 102  CO2 19*  --  19*  GLUCOSE 112* 106* 111*  BUN 35* 38* 35*  CREATININE 3.26* 3.00* 3.32*  CALCIUM 8.8*  --  8.6*  MG  --   --  1.9   GFR: Estimated Creatinine Clearance: 12.2 mL/min (A) (by C-G formula based on SCr of 3.32 mg/dL (H)). Liver Function Tests: Recent Labs  Lab 06/18/2020 2143 06/13/20 0045  AST 45* 45*  ALT 22 21  ALKPHOS 47 45  BILITOT 3.9* 3.5*  PROT 5.7* 5.3*  ALBUMIN 2.6* 2.4*   Recent Labs  Lab 06/11/2020 2143  LIPASE 26     Radiology Studies: I have reviewed all of the imaging during this hospital visit personally     Scheduled Meds: . sodium chloride flush  3 mL Intravenous Q12H   Continuous Infusions: . sodium chloride    . fentaNYL infusion INTRAVENOUS 20 mcg/hr (07/03/2020 1245)     LOS: 2 days        Bonnielee Haff, MD  Pager on [email or web address]

## 2020-06-19 NOTE — Progress Notes (Signed)
Daily Progress Note   Patient Name: [name] [name]       Date: 07/03/20 DOB: Sep 02, 1928  Age: 87 y.o. MRN#: [id number] Attending Physician: Bonnielee Haff, MD Primary Care Physician: Merrilee Seashore, MD Admit Date: 05/31/2020  Reason for Consultation/Follow-up: Non pain symptom management, Pain control, Psychosocial/spiritual support and Terminal Care  Subjective: Chart review performed.  Received report from primary RN -no acute concerns.  Discussed bladder scan results of 254 - no need to place Foley yet.  Went to visit patient at bedside -several family/visitors present.  Patient was lying in bed -she does not wake to voice/gentle touch. No signs or non-verbal gestures of pain or discomfort noted. No respiratory distress, increased work of breathing, or secretions noted.  Patient is on 2 L O2 nasal cannula.  Fentanyl drip is infusing at 10 mcg/h.  Therapeutic listening and emotional support provided to family.  Discussed again today weaning patient off supplemental oxygen and managing symptoms with comfort medications -family is agreeable to discontinue oxygen today.  Provided patient 25 mcg bolus dose of fentanyl for dyspnea prevention and plan to remove oxygen in 15 to 30 minutes.  Family expressed appreciation for "Gone From My Sight" book provided yesterday and PMT assistance in patient's care.  All questions and concerns addressed. Encouraged to call with questions and/or concerns. PMT card previously provided.  Discussed with RN plan to remove oxygen.   Length of Stay: 2  Current Medications: Scheduled Meds:  . sodium chloride flush  3 mL Intravenous Q12H    Continuous Infusions: . sodium chloride    . fentaNYL infusion INTRAVENOUS 10 mcg/hr (06/14/20 1507)    PRN  Meds: sodium chloride, acetaminophen **OR** acetaminophen, antiseptic oral rinse, bisacodyl, diphenhydrAMINE, fentaNYL, fentaNYL (SUBLIMAZE) injection, glycopyrrolate **OR** glycopyrrolate **OR** glycopyrrolate, haloperidol **OR** haloperidol **OR** haloperidol lactate, LORazepam **OR** LORazepam **OR** LORazepam, ondansetron **OR** ondansetron (ZOFRAN) IV, polyvinyl alcohol, sodium chloride flush  Physical Exam Vitals and nursing note reviewed.  Constitutional:      General: She is not in acute distress.    Appearance: She is cachectic. She is ill-appearing.  Pulmonary:     Effort: No respiratory distress.  Skin:    General: Skin is cool and dry.  Neurological:     Mental Status: She is unresponsive.     Motor: Weakness present.  Psychiatric:  Speech: She is noncommunicative.             Vital Signs: BP 94/69 (BP Location: Right Arm)   Pulse (!) 118   Temp 98.5 F (36.9 C) (Oral)   Resp 19   Ht 5\' 6"  (1.676 m)   Wt 79.9 kg   SpO2 93%   BMI 28.43 kg/m  SpO2: SpO2: 93 % O2 Device:   O2 Flow Rate:    Intake/output summary:   Intake/Output Summary (Last 24 hours) at June 16, 2020 1024 Last data filed at 06/14/2020 1831 Gross per 24 hour  Intake 0 ml  Output --  Net 0 ml   LBM: Last BM Date: 06/11/20 Baseline Weight: Weight: 85.3 kg Most recent weight: Weight: 79.9 kg       Palliative Assessment/Data: PPS 10%    Flowsheet Rows   Flowsheet Row Most Recent Value  Intake Tab   Referral Department Hospitalist  Unit at Time of Referral Med/Surg Unit  Palliative Care Primary Diagnosis Neurology  Date Notified 06/13/20  Palliative Care Type Return patient Palliative Care  Reason for referral Clarify Goals of Care  Date of Admission 05/27/2020  Date first seen by Palliative Care 06/13/20  # of days Palliative referral response time 0 Day(s)  # of days IP prior to Palliative referral 1  Clinical Assessment   Psychosocial & Spiritual Assessment   Palliative Care  Outcomes   Patient/Family meeting held? Yes  Who was at the meeting? Patient, daughter, 2 sons, and granddaughter  Palliative Care Outcomes Improved pain interventions, Improved non-pain symptom therapy, Clarified goals of care, Counseled regarding hospice, Provided end of life care assistance, Provided psychosocial or spiritual support, Changed to focus on comfort  Patient/Family wishes: Interventions discontinued/not started  Mechanical Ventilation, Antibiotics, Tube feedings/TPN, BiPAP, Hemodialysis, NIPPV, Trach, Transfusion, Vasopressors, PEG, Transfer out of ICU      Patient Active Problem List   Diagnosis Date Noted  . Demand ischemia ([place]) 06/13/2020  . Lower GI bleed 06/13/2020  . Heart failure ([place]) 06/13/2020  . Elevated troponin   . Acute on chronic combined systolic and diastolic CHF (congestive heart failure) ([place]) 05/25/2020  . Comfort measures only status   . Aortic root dilatation ([place]) 05/02/2020  . Weakness   . Acute kidney injury ([place])   . Decreased urine output   . Goals of care, counseling/discussion   . Palliative care by specialist   . Atrial flutter ([place]) 04/29/2020  . Traumatic Soft tissue injury of hip, left, initial encounter 04/29/2020  . Hypomagnesemia 04/29/2020  . Protein calorie malnutrition ([place]) 04/29/2020  . Frailty syndrome in geriatric patient 04/29/2020  . TSH elevation 04/29/2020  . Dehydration 04/29/2020  . Syncope, possible 04/29/2020  . Hypophosphatemia 04/29/2020  . Fall 04/28/2020  . Dementia ([place]) 01/27/2020  . Pneumonia due to COVID-19 virus 12/23/2019  . Acute hypoxemic respiratory failure ([place]) 12/23/2019  . Hypokalemia with generalized weakness 12/23/2019  . Transaminitis 12/23/2019  . Slow heart rate 03/13/2019  . Mild neurocognitive disorder 02/26/2019  . [place] 01/20/2019  . Porokeratosis 09/02/2018  . Corns and callosities 09/02/2018  . Elevated CK   . Pressure injury of skin 08/27/2018  . PAF (paroxysmal atrial  fibrillation) ([place]) 08/27/2018  . Hypernatremia   . Rhabdomyolysis 08/26/2018  . Normocytic anemia 08/26/2018  . Renal insufficiency 08/26/2018  . Community acquired pneumonia 01/13/2013  . Sepsis, Rule Out 01/13/2013  . Hyponatremia 01/13/2013  . Hypochloremia 01/13/2013  . Rheumatoid arthritis ([place]) 01/13/2013  . Premature atrial contractions 07/22/2012  .  Hyperlipidemia 07/22/2012  . Carotid artery bruit 07/22/2012  . Moderate aortic stenosis 07/22/2012  . Chest pain   . Essential hypertension     Palliative Care Assessment & Plan   Patient Profile: 87 y.o.femalewith past medical history of dementia, atrial flutter, atrial fibrillation, hypertension, heart failure, severe mitral regurgitation, severe aortic stenosis,malnutrition, and CKD presented to [name] ED on 05/28/2020 from [place] home with family reports of blood in patient's stool along with nausea and vomiting.Patient wasadmitted on 4/24/2022with acute on chronic CHF,AKI on CKD4,hypotension, and GI bleed.  Cardiology was consulted due to patient's extensive cardiac historyand hypotension-they felt patient to be in a decompensated state and feel comfort measures are appropriate at this time.GI was also consulted for GI bleed -family were clear they did not wish to pursue invasive interventions such as colonoscopy.  Of note, patient was recently admitted from 3/10 - 05/11/2020 for hypokalemia and generalized weakness. PMT saw patient and family during this admission.  ED Course:Vital signs in ED significant for blood pressure soft in the 62V to 035K systolic, heart rate in the 100s, respirations in the 20s, temperature 97.5. Lab work-up showed CMP with creatinine of 3.2 up from baseline 2.5. Calcium 8.8, protein 5.7, albumin 2.2, AST 645, T bili 3.9. FOBT was positive, lipase negative. CBC within normal limits. Respiratory panel flu COVID pending. Troponin came back elevated at 297 and BNP  elevated at 1382. Patient was typed and screened in the ED. Chest x-ray showed right basilar consolidation with small pleural effusion. Cardiomegaly. Central vascular congestion. A message was sent to GI by the EDP and I consulted cardiology for further assistance.  Assessment: Acute on chronic combined systolic and diastolic CHF Acute cardiogenic shock Dementia Atrial flutter AKI on CKD stage IV Demand ischemia Lower GI bleed Elevated troponin Terminal care  Recommendations/Plan: Continue full comfort measures Continue DNR/DNI as previously documented Patient does not seem stable for transfer to residential hospice at this time - anticipate hospital death Wean patient to room air and manage symptoms per "Oxygen Therapy" order Adjusted fentanyl infusion and bolus dose order - as patient weans off oxygen may require increased bolus doses and/or basal rate.  RN may adjust per order.  Nursing to provide frequent assessments and administer PRN medications as clinically necessary to ensure [name] comfort PMT will continue to follow holistically  Goals of Care and Additional Recommendations: Limitations on Scope of Treatment: Full Comfort Care  Code Status:    Code Status Orders  (From admission, onward)         Start     Ordered   06/14/20 1343  Do not attempt resuscitation (DNR)  Continuous       Question Answer Comment  In the event of cardiac or respiratory ARREST Do not call a "code blue"   In the event of cardiac or respiratory ARREST Do not perform Intubation, CPR, defibrillation or ACLS   In the event of cardiac or respiratory ARREST Use medication by any route, position, wound care, and other measures to relive pain and suffering. May use oxygen, suction and manual treatment of airway obstruction as needed for comfort.      06/14/20 1343        Code Status History    Date Active Date Inactive Code Status Order ID Comments User Context   06/13/2020 1553 06/14/2020 1343  DNR [id number]  Lin Landsman, NP Inpatient   06/13/2020 0017 06/13/2020 1553 DNR [id number]  Marcelyn Bruins, MD ED  06/11/2020 2252 06/13/2020 0017 DNR [id number]  Drenda Freeze, MD ED   05/05/2020 [phone] 05/11/2020 1815 DNR [id number]  Norberta Keens, PA-C Inpatient   04/28/2020 2135 05/05/2020 0922 DNR [id number]  Rise Patience, DO ED   12/23/2019 0533 12/29/2019 2216 Full Code [id number]  Shela Leff, MD ED   08/26/2018 1714 08/28/2018 2107 Full Code [id number]  Norval Morton, MD ED   01/13/2013 1743 01/16/2013 1400 Full Code [id number]  Cristal Ford, DO ED   Advance Care Planning Activity    Advance Directive Documentation   Flowsheet Row Most Recent Value  Type of Advance Directive Out of facility DNR (pink MOST or yellow form)  Pre-existing out of facility DNR order (yellow form or pink MOST form) Yellow form placed in chart (order not valid for inpatient use)  "MOST" Form in Place? --      Prognosis:  Hours - Days  Discharge Planning: Anticipated Hospital Death  Care plan was discussed with primary RN, patient's family  Thank you for allowing the Palliative Medicine Team to assist in the care of this patient.   Total Time 30 minutes Prolonged Time Billed  no       Greater than 50%  of this time was spent counseling and coordinating care related to the above assessment and plan.  Lin Landsman, NP  Please contact Palliative Medicine Team phone at [phone] for questions and concerns.   *Portions of this note are a verbal dictation therefore any spelling and/or grammatical errors are due to the "[place] [employer]" system interpretation.

## 2020-06-19 NOTE — Progress Notes (Signed)
This chaplain is present for F/U spiritual care with the Pt. and family. The chaplain was updated by the Pt. RN-Heather before the visit.   The chaplain met Pt. son-Raymond in the hall. The chaplain joined the family in the Pt. room. The family agreed with the chaplain's observation of the Pt. resting comfortably.   The family continues to surround the Pt. in prayer. The chaplain introduced the opportunity of placing the Pt. hand prints on a poem as a moment the family can share together.  The opportunity was accepted by the family. The chaplain witnessed laughter and joy among the anticipatory grief in the ritual.  This chaplain is available for F/U spiritual care as needed.

## 2020-06-19 NOTE — Progress Notes (Signed)
Bolus given, per Daughter, Webb Silversmith, request

## 2020-06-19 NOTE — Death Summary Note (Addendum)
DEATH SUMMARY   Patient Details  Name: [name] [name] MRN: [id number] DOB: 1928/12/09  Admission/Discharge Information   Admit Date:  2020-07-01  Date of Death: Date of Death: 2020-07-04  Time of Death: Time of Death: 05/30/33  Length of Stay: 3  Referring Physician: Merrilee Seashore, MD   Reason(s) for Hospitalization  Acute on chronic systolic CHF  Diagnoses   Preliminary cause of death: Acute on chronic systolic CHF (congestive heart failure) ([place])  Secondary Diagnoses (including complications and co-morbidities):  Principal Problem:   Acute on chronic combined systolic and diastolic CHF (congestive heart failure) ([place]) Active Problems:   Essential hypertension   Hyperlipidemia   Dementia ([place])   Atrial flutter ([place])   Acute kidney injury ([place])   Demand ischemia ([place])   Lower GI bleed   Elevated troponin   Heart failure [facility])   Brief Hospital Course (including significant findings, care, treatment, and services provided and events leading to death)   Brief HPI:  [name] [name] was admitted to the hospital with a working diagnosis of acute on chronic systolic heart failure decompensation.  87 year old female with a past medical history for dementia, atrial fibrillation/flutter, hypertension, heart failure, severe mitral regurgitation, severe aortic stenosis, dyslipidemia, rheumatoid arthritis, chronic kidney disease and malnutrition who presented with bloody stools. Patient lives at a nursing facility where she was noted to have bloody stools, several episodes associated with nausea and vomiting that prompted her to be brought to the hospital.   Patient with end stage heart failure, acutely decompensated. Not surgical candidate. Not able to tolerate medical therapy due to hypotension.    Hospital Course:   Acute on chronic systolic CHF/Moderate to severe aortic stenosis/Acute cardiogenic shock.  EF 40 to 45% LV, with global hypokinesis, moderate to severe aortic  stenosis, moderate to severe mitral valve regurgitation. Right and left atrium with severe dilatation.  Unable to diurese due to hypotension, patient with poor prognosis.  Patient was seen by cardiology.  She was not considered a candidate for advanced invasive treatments.  After further discussions patient was transitioned over to comfort care.  Acute lower GI bleed Was noted to have hematochezia.  GI was consulted.  However patient was subsequently transitioned to comfort care.  AKI on CKD stage 4.  Worsening renal function, acute cardio renal syndrome.  Atrial flutter with variable block Limited pharmacologic options in the setting of heart failure and hypotension, and cardiogenic shock.  Dementia, metabolic encephalopathy Patient with sedation poorly responsive, positive episodic agitation, continue with palliative care.   Comfort measures were initiated as discussed above.  Patient subsequently expired on Jul 04, 2020 at 11:35 PM.    Pertinent Labs and Studies  Significant Diagnostic Studies DG Chest Port 1 View  Result Date: 2020/07/01 CLINICAL DATA:  Vomiting. EXAM: PORTABLE CHEST 1 VIEW COMPARISON:  December 21, 2019 FINDINGS: Similar cardiac enlargement with central vascular congestion. Coronary artery calcifications. Right basilar consolidation with small right pleural effusion. The visualized skeletal structures are unchanged. IMPRESSION: 1. Right basilar consolidation with small right pleural effusion, concerning for pneumonia. 2. Similar cardiac enlargement with central vascular congestion. Electronically Signed   By: Dahlia Bailiff MD   On: Jul 01, 2020 23:02   ECHOCARDIOGRAM COMPLETE  Result Date: 06/13/2020    ECHOCARDIOGRAM REPORT   Patient Name:   [name] [name] Date of Exam: 06/13/2020 Medical Rec #:  [id number]            Height:       66.0 in Accession #:  [id number]           Weight:       176.1 lb Date of Birth:  11-Jun-1928             BSA:          1.895 m  Patient Age:    86 years             BP:           93/72 mmHg Patient Gender: F                    HR:           104 bpm. Exam Location:  Inpatient Procedure: 2D Echo, 3D Echo, Cardiac Doppler, Color Doppler and Strain Analysis Indications:    CHF-Acute Systolic W26.37  History:        Patient has prior history of Echocardiogram examinations, most                 recent 04/29/2020. Dementia, a flutter, A. fib, hypertension,                 heart failure, severe mitral regurgitation and severe aortic                 stenosis, hyperlipidemia, malnutrition, rheumatoid arthritis,                 CKD, acute on chronic CHF who presents with nausea, vomiting,                 bloody stools.  Sonographer:    Darlina Sicilian RDCS Referring Phys: [id number] Advance  1. Left ventricular ejection fraction, by estimation, is 40 to 45%. The left ventricle has mildly decreased function. The left ventricle demonstrates global hypokinesis. Left ventricular diastolic parameters are indeterminate. Elevated left ventricular end-diastolic pressure. The average left ventricular global longitudinal strain is -5.7 %. The global longitudinal strain is abnormal.  2. Visually, aortic stenosis appears to be at least moderate. Peak velocity and mean gradient are consitent with mild aortic stenosis. However, given reduced systolic function, this is likely low-flow, low-gradient aortic stenosis. Based on the dimensionless index (0.29) it is likely moderate to severe aortic stenosis. The aortic valve is tricuspid. There is severe calcifcation of the aortic valve. There is severe thickening of the aortic valve. Aortic valve regurgitation is mild. Moderate to severe aortic valve stenosis. Aortic valve area, by VTI measures 0.67 cm. Aortic valve mean gradient measures 17.7 mmHg. Aortic valve Vmax measures 2.64 m/s.  3. The mitral valve is normal in structure. Moderate to severe mitral valve regurgitation. No evidence of mitral  stenosis.  4. The inferior vena cava is dilated in size with <50% respiratory variability, suggesting right atrial pressure of 15 mmHg.  5. Aortic dilatation noted. There is mild dilatation of the ascending aorta, measuring 43 mm.  6. There is poor coaptation of the tricuspid valve leaflets. This is likely attributable to annular dilitation. Tricuspid valve regurgitation is severe.  7. Right atrial size was severely dilated.  8. Left atrial size was severely dilated.  9. Overall, study findings unchanged from 04/2020. FINDINGS  Left Ventricle: Left ventricular ejection fraction, by estimation, is 40 to 45%. The left ventricle has mildly decreased function. The left ventricle demonstrates global hypokinesis. The average left ventricular global longitudinal strain is -5.7 %. The  global longitudinal strain is abnormal. The left ventricular internal cavity size was normal in size. There is no  left ventricular hypertrophy. Left ventricular diastolic parameters are indeterminate. Elevated left ventricular end-diastolic pressure. Right Ventricle: The right ventricular size is mildly enlarged. No increase in right ventricular wall thickness. Right ventricular systolic function is moderately reduced. There is normal pulmonary artery systolic pressure. The tricuspid regurgitant velocity is 1.63 m/s, and with an assumed right atrial pressure of 15 mmHg, the estimated right ventricular systolic pressure is 64.4 mmHg. Left Atrium: Left atrial size was severely dilated. Right Atrium: Right atrial size was severely dilated. Pericardium: There is no evidence of pericardial effusion. Mitral Valve: The mitral valve is normal in structure. Moderate to severe mitral valve regurgitation, with posteriorly-directed jet. No evidence of mitral valve stenosis. Tricuspid Valve: There is poor coaptation of the tricuspid valve leaflets. This is likely attributable to annular dilitation. The tricuspid valve is normal in structure. Tricuspid  valve regurgitation is severe. No evidence of tricuspid stenosis. Aortic Valve: Visually, aortic stenosis appears to be at least moderate. Peak velocity and mean gradient are consitent with mild aortic stenosis. However, given reduced systolic function, this is likely low-flow, low-gradient aortic stenosis. Based on the dimensionless index (0.29) it is likely moderate to severe aortic stenosis. The aortic valve is tricuspid. There is severe calcifcation of the aortic valve. There is severe thickening of the aortic valve. Aortic valve regurgitation is mild. Moderate to severe aortic stenosis is present. Aortic valve mean gradient measures 17.7 mmHg. Aortic valve peak gradient measures 27.9 mmHg. Aortic valve area, by VTI measures 0.67 cm. Pulmonic Valve: The pulmonic valve was normal in structure. Pulmonic valve regurgitation is mild to moderate. No evidence of pulmonic stenosis. Aorta: Aortic dilatation noted. There is mild dilatation of the ascending aorta, measuring 43 mm. Venous: The inferior vena cava is dilated in size with less than 50% respiratory variability, suggesting right atrial pressure of 15 mmHg. IAS/Shunts: No atrial level shunt detected by color flow Doppler.  LEFT VENTRICLE PLAX 2D LVIDd:         4.40 cm  Diastology LVIDs:         3.40 cm  LV e' medial:    7.58 cm/s LV PW:         1.00 cm  LV E/e' medial:  10.7 LV IVS:        1.00 cm  LV e' lateral:   7.99 cm/s LVOT diam:     1.70 cm  LV E/e' lateral: 10.1 LV SV:         30 LV SV Index:   16       2D Longitudinal Strain LVOT Area:     2.27 cm 2D Strain GLS (A2C):   -5.6 %                         2D Strain GLS (A3C):   -4.7 %                         2D Strain GLS (A4C):   -6.7 %                         2D Strain GLS Avg:     -5.7 % RIGHT VENTRICLE TAPSE (M-mode): 0.9 cm LEFT ATRIUM             Index       RIGHT ATRIUM           Index LA diam:  4.10 cm 2.16 cm/m  RA Area:     32.10 cm LA Vol (A2C):   90.3 ml 47.66 ml/m RA Volume:   114.00  ml 60.17 ml/m LA Vol (A4C):   57.1 ml 30.11 ml/m LA Biplane Vol: 82.5 ml 43.54 ml/m  AORTIC VALVE AV Area (Vmax):    0.62 cm AV Area (Vmean):   0.62 cm AV Area (VTI):     0.67 cm AV Vmax:           264.33 cm/s AV Vmean:          199.000 cm/s AV VTI:            0.450 m AV Peak Grad:      27.9 mmHg AV Mean Grad:      17.7 mmHg LVOT Vmax:         72.50 cm/s LVOT Vmean:        54.600 cm/s LVOT VTI:          0.132 m LVOT/AV VTI ratio: 0.29  AORTA Ao Root diam: 3.20 cm Ao Asc diam:  4.30 cm MITRAL VALVE                 TRICUSPID VALVE MV Area (PHT): 5.84 cm      TR Peak grad:   10.6 mmHg MV Decel Time: 130 msec      TR Vmax:        163.00 cm/s MR Peak grad:    87.4 mmHg MR Mean grad:    52.0 mmHg   SHUNTS MR Vmax:         467.50 cm/s Systemic VTI:  0.13 m MR Vmean:        331.5 cm/s  Systemic Diam: 1.70 cm MR PISA:         2.26 cm MR PISA Eff ROA: 13 mm MR PISA Radius:  0.60 cm MV E velocity: 81.00 cm/s Skeet Latch MD Electronically signed by Skeet Latch MD Signature Date/Time: 06/13/2020/1:25:41 PM    Final     Microbiology Recent Results (from the past 240 hour(s))  Resp Panel by RT-PCR (Flu A&B, Covid) Nasopharyngeal Swab     Status: None   Collection Time: 06/07/2020 10:56 PM   Specimen: Nasopharyngeal Swab; Nasopharyngeal(NP) swabs in vial transport medium  Result Value Ref Range Status   SARS Coronavirus 2 by RT PCR NEGATIVE NEGATIVE Final    Comment: (NOTE) SARS-CoV-2 target nucleic acids are NOT DETECTED.  The SARS-CoV-2 RNA is generally detectable in upper respiratory specimens during the acute phase of infection. The lowest concentration of SARS-CoV-2 viral copies this assay can detect is 138 copies/mL. A negative result does not preclude SARS-Cov-2 infection and should not be used as the sole basis for treatment or other patient management decisions. A negative result may occur with  improper specimen collection/handling, submission of specimen other than nasopharyngeal swab,  presence of viral mutation(s) within the areas targeted by this assay, and inadequate number of viral copies(<138 copies/mL). A negative result must be combined with clinical observations, patient history, and epidemiological information. The expected result is Negative.  Fact Sheet for Patients:  EntrepreneurPulse.com.au  Fact Sheet for Healthcare Providers:  IncredibleEmployment.be  This test is no t yet approved or cleared by the [place] FDA and  has been authorized for detection and/or diagnosis of SARS-CoV-2 by FDA under an Emergency Use Authorization (EUA). This EUA will remain  in effect (meaning this test can be used) for the duration of the COVID-19 declaration under  Section 564(b)(1) of the Act, 21 U.S.C.section 360bbb-3(b)(1), unless the authorization is terminated  or revoked sooner.       Influenza A by PCR NEGATIVE NEGATIVE Final   Influenza B by PCR NEGATIVE NEGATIVE Final    Comment: (NOTE) The Xpert Xpress SARS-CoV-2/FLU/RSV plus assay is intended as an aid in the diagnosis of influenza from Nasopharyngeal swab specimens and should not be used as a sole basis for treatment. Nasal washings and aspirates are unacceptable for Xpert Xpress SARS-CoV-2/FLU/RSV testing.  Fact Sheet for Patients: EntrepreneurPulse.com.au  Fact Sheet for Healthcare Providers: IncredibleEmployment.be  This test is not yet approved or cleared by the [place] FDA and has been authorized for detection and/or diagnosis of SARS-CoV-2 by FDA under an Emergency Use Authorization (EUA). This EUA will remain in effect (meaning this test can be used) for the duration of the COVID-19 declaration under Section 564(b)(1) of the Act, 21 U.S.C. section 360bbb-3(b)(1), unless the authorization is terminated or revoked.  Performed at [facility], [place] [address], [place], [place] [address]     Lab Basic  Metabolic Panel: Recent Labs  Lab 05/26/2020 2143 06/14/2020 2257 06/13/20 0045  NA 138 138 139  K 4.0 3.8 4.1  CL 100 102 102  CO2 19*  --  19*  GLUCOSE 112* 106* 111*  BUN 35* 38* 35*  CREATININE 3.26* 3.00* 3.32*  CALCIUM 8.8*  --  8.6*  MG  --   --  1.9   Liver Function Tests: Recent Labs  Lab 05/31/2020 2143 06/13/20 0045  AST 45* 45*  ALT 22 21  ALKPHOS 47 45  BILITOT 3.9* 3.5*  PROT 5.7* 5.3*  ALBUMIN 2.6* 2.4*   Recent Labs  Lab 05/29/2020 2143  LIPASE 26   CBC: Recent Labs  Lab 05/20/2020 2143 05/24/2020 2257 06/13/20 0045  WBC 9.1  --  8.8  NEUTROABS 7.2  --   --   HGB 13.0 15.0 12.2  HCT 41.5 44.0 38.8  MCV 95.2  --  94.6  PLT 112*  --  107*   Sepsis Labs: Recent Labs  Lab 05/31/2020 2143 06/13/20 0045  WBC 9.1 8.8      Reneta Niehaus 05/25/2020, 7:09 AM

## 2020-06-19 NOTE — Death Summary Note (Incomplete Revision)
DEATH SUMMARY   Patient Details  Name: [name] [name] MRN: [id number] DOB: Jan 18, 1929  Admission/Discharge Information   Admit Date:  07-04-20  Date of Death: Date of Death: 07/07/20  Time of Death: Time of Death: Jun 02, 2333  Length of Stay: 3  Referring Physician: Merrilee Seashore, MD   Reason(s) for Hospitalization  Acute on chronic systolic CHF  Diagnoses   Preliminary cause of death: Acute on chronic systolic CHF (congestive heart failure) ([place])  Secondary Diagnoses (including complications and co-morbidities):  Principal Problem:   Acute on chronic combined systolic and diastolic CHF (congestive heart failure) ([place]) Active Problems:   Essential hypertension   Hyperlipidemia   Dementia ([name] [name])   Atrial flutter ([place])   Acute kidney injury ([place])   Demand ischemia ([place])   Lower GI bleed   Elevated troponin   Heart failure [facility])   Brief Hospital Course (including significant findings, care, treatment, and services provided and events leading to death)   Brief HPI:  [name] [name] was admitted to the hospital with a working diagnosis of acute on chronic systolic heart failure decompensation.  87 year old female with a past medical history for dementia, atrial fibrillation/flutter, hypertension, heart failure, severe mitral regurgitation, severe aortic stenosis, dyslipidemia, rheumatoid arthritis, chronic kidney disease and malnutrition who presented with bloody stools. Patient lives at a nursing facility where she was noted to have bloody stools, several episodes associated with nausea and vomiting that prompted her to be brought to the hospital.   Patient with end stage heart failure, acutely decompensated. Not surgical candidate. Not able to tolerate medical therapy due to hypotension.    Hospital Course:   Acute on chronic systolic CHF/Moderate to severe aortic stenosis/Acute cardiogenic shock.  EF 40 to 45% LV, with global hypokinesis, moderate to severe aortic  stenosis, moderate to severe mitral valve regurgitation. Right and left atrium with severe dilatation.  Unable to diurese due to hypotension, patient with poor prognosis.  Patient was seen by cardiology.  She was not considered a candidate for advanced invasive treatments.  After further discussions patient was transitioned over to comfort care.  Acute lower GI bleed Was noted to have hematochezia.  GI was consulted.  However patient was subsequently transitioned to comfort care.  AKI on CKD stage 4.  Worsening renal function, acute cardio renal syndrome.  Atrial flutter with variable block Limited pharmacologic options in the setting of heart failure and hypotension, and cardiogenic shock.  Dementia, metabolic encephalopathy Patient with sedation poorly responsive, positive episodic agitation, continue with palliative care.   Comfort measures were initiated as discussed above.  Patient subsequently expired on Jul 07, 2020 at 11:35 PM.    Pertinent Labs and Studies  Significant Diagnostic Studies DG Chest Port 1 View  Result Date: 07/04/20 CLINICAL DATA:  Vomiting. EXAM: PORTABLE CHEST 1 VIEW COMPARISON:  December 21, 2019 FINDINGS: Similar cardiac enlargement with central vascular congestion. Coronary artery calcifications. Right basilar consolidation with small right pleural effusion. The visualized skeletal structures are unchanged. IMPRESSION: 1. Right basilar consolidation with small right pleural effusion, concerning for pneumonia. 2. Similar cardiac enlargement with central vascular congestion. Electronically Signed   By: Dahlia Bailiff MD   On: 07-04-20 23:02   ECHOCARDIOGRAM COMPLETE  Result Date: 06/13/2020    ECHOCARDIOGRAM REPORT   Patient Name:   [name] [name] Date of Exam: 06/13/2020 Medical Rec #:  [id number]            Height:       66.0 in Accession #:  [id number]           Weight:       176.1 lb Date of Birth:  Jun 20, 1928             BSA:          1.895 m  Patient Age:    87 years             BP:           93/72 mmHg Patient Gender: F                    HR:           104 bpm. Exam Location:  Inpatient Procedure: 2D Echo, 3D Echo, Cardiac Doppler, Color Doppler and Strain Analysis Indications:    CHF-Acute Systolic G95.62  History:        Patient has prior history of Echocardiogram examinations, most                 recent 04/29/2020. Dementia, a flutter, A. fib, hypertension,                 heart failure, severe mitral regurgitation and severe aortic                 stenosis, hyperlipidemia, malnutrition, rheumatoid arthritis,                 CKD, acute on chronic CHF who presents with nausea, vomiting,                 bloody stools.  Sonographer:    Darlina Sicilian RDCS Referring Phys: [id number] [place]  1. Left ventricular ejection fraction, by estimation, is 40 to 45%. The left ventricle has mildly decreased function. The left ventricle demonstrates global hypokinesis. Left ventricular diastolic parameters are indeterminate. Elevated left ventricular end-diastolic pressure. The average left ventricular global longitudinal strain is -5.7 %. The global longitudinal strain is abnormal.  2. Visually, aortic stenosis appears to be at least moderate. Peak velocity and mean gradient are consitent with mild aortic stenosis. However, given reduced systolic function, this is likely low-flow, low-gradient aortic stenosis. Based on the dimensionless index (0.29) it is likely moderate to severe aortic stenosis. The aortic valve is tricuspid. There is severe calcifcation of the aortic valve. There is severe thickening of the aortic valve. Aortic valve regurgitation is mild. Moderate to severe aortic valve stenosis. Aortic valve area, by VTI measures 0.67 cm. Aortic valve mean gradient measures 17.7 mmHg. Aortic valve Vmax measures 2.64 m/s.  3. The mitral valve is normal in structure. Moderate to severe mitral valve regurgitation. No evidence of mitral  stenosis.  4. The inferior vena cava is dilated in size with <50% respiratory variability, suggesting right atrial pressure of 15 mmHg.  5. Aortic dilatation noted. There is mild dilatation of the ascending aorta, measuring 43 mm.  6. There is poor coaptation of the tricuspid valve leaflets. This is likely attributable to annular dilitation. Tricuspid valve regurgitation is severe.  7. Right atrial size was severely dilated.  8. Left atrial size was severely dilated.  9. Overall, study findings unchanged from 04/2020. FINDINGS  Left Ventricle: Left ventricular ejection fraction, by estimation, is 40 to 45%. The left ventricle has mildly decreased function. The left ventricle demonstrates global hypokinesis. The average left ventricular global longitudinal strain is -5.7 %. The  global longitudinal strain is abnormal. The left ventricular internal cavity size was normal in size. There is no  left ventricular hypertrophy. Left ventricular diastolic parameters are indeterminate. Elevated left ventricular end-diastolic pressure. Right Ventricle: The right ventricular size is mildly enlarged. No increase in right ventricular wall thickness. Right ventricular systolic function is moderately reduced. There is normal pulmonary artery systolic pressure. The tricuspid regurgitant velocity is 1.63 m/s, and with an assumed right atrial pressure of 15 mmHg, the estimated right ventricular systolic pressure is 09.6 mmHg. Left Atrium: Left atrial size was severely dilated. Right Atrium: Right atrial size was severely dilated. Pericardium: There is no evidence of pericardial effusion. Mitral Valve: The mitral valve is normal in structure. Moderate to severe mitral valve regurgitation, with posteriorly-directed jet. No evidence of mitral valve stenosis. Tricuspid Valve: There is poor coaptation of the tricuspid valve leaflets. This is likely attributable to annular dilitation. The tricuspid valve is normal in structure. Tricuspid  valve regurgitation is severe. No evidence of tricuspid stenosis. Aortic Valve: Visually, aortic stenosis appears to be at least moderate. Peak velocity and mean gradient are consitent with mild aortic stenosis. However, given reduced systolic function, this is likely low-flow, low-gradient aortic stenosis. Based on the dimensionless index (0.29) it is likely moderate to severe aortic stenosis. The aortic valve is tricuspid. There is severe calcifcation of the aortic valve. There is severe thickening of the aortic valve. Aortic valve regurgitation is mild. Moderate to severe aortic stenosis is present. Aortic valve mean gradient measures 17.7 mmHg. Aortic valve peak gradient measures 27.9 mmHg. Aortic valve area, by VTI measures 0.67 cm. Pulmonic Valve: The pulmonic valve was normal in structure. Pulmonic valve regurgitation is mild to moderate. No evidence of pulmonic stenosis. Aorta: Aortic dilatation noted. There is mild dilatation of the ascending aorta, measuring 43 mm. Venous: The inferior vena cava is dilated in size with less than 50% respiratory variability, suggesting right atrial pressure of 15 mmHg. IAS/Shunts: No atrial level shunt detected by color flow Doppler.  LEFT VENTRICLE PLAX 2D LVIDd:         4.40 cm  Diastology LVIDs:         3.40 cm  LV e' medial:    7.58 cm/s LV PW:         1.00 cm  LV E/e' medial:  10.7 LV IVS:        1.00 cm  LV e' lateral:   7.99 cm/s LVOT diam:     1.70 cm  LV E/e' lateral: 10.1 LV SV:         30 LV SV Index:   16       2D Longitudinal Strain LVOT Area:     2.27 cm 2D Strain GLS (A2C):   -5.6 %                         2D Strain GLS (A3C):   -4.7 %                         2D Strain GLS (A4C):   -6.7 %                         2D Strain GLS Avg:     -5.7 % RIGHT VENTRICLE TAPSE (M-mode): 0.9 cm LEFT ATRIUM             Index       RIGHT ATRIUM           Index LA diam:  4.10 cm 2.16 cm/m  RA Area:     32.10 cm LA Vol (A2C):   90.3 ml 47.66 ml/m RA Volume:   114.00  ml 60.17 ml/m LA Vol (A4C):   57.1 ml 30.11 ml/m LA Biplane Vol: 82.5 ml 43.54 ml/m  AORTIC VALVE AV Area (Vmax):    0.62 cm AV Area (Vmean):   0.62 cm AV Area (VTI):     0.67 cm AV Vmax:           264.33 cm/s AV Vmean:          199.000 cm/s AV VTI:            0.450 m AV Peak Grad:      27.9 mmHg AV Mean Grad:      17.7 mmHg LVOT Vmax:         72.50 cm/s LVOT Vmean:        54.600 cm/s LVOT VTI:          0.132 m LVOT/AV VTI ratio: 0.29  AORTA Ao Root diam: 3.20 cm Ao Asc diam:  4.30 cm MITRAL VALVE                 TRICUSPID VALVE MV Area (PHT): 5.84 cm      TR Peak grad:   10.6 mmHg MV Decel Time: 130 msec      TR Vmax:        163.00 cm/s MR Peak grad:    87.4 mmHg MR Mean grad:    52.0 mmHg   SHUNTS MR Vmax:         467.50 cm/s Systemic VTI:  0.13 m MR Vmean:        331.5 cm/s  Systemic Diam: 1.70 cm MR PISA:         2.26 cm MR PISA Eff ROA: 13 mm MR PISA Radius:  0.60 cm MV E velocity: 81.00 cm/s Skeet Latch MD Electronically signed by Skeet Latch MD Signature Date/Time: 06/13/2020/1:25:41 PM    Final     Microbiology Recent Results (from the past 240 hour(s))  Resp Panel by RT-PCR (Flu A&B, Covid) Nasopharyngeal Swab     Status: None   Collection Time: 06/10/2020 10:56 PM   Specimen: Nasopharyngeal Swab; Nasopharyngeal(NP) swabs in vial transport medium  Result Value Ref Range Status   SARS Coronavirus 2 by RT PCR NEGATIVE NEGATIVE Final    Comment: (NOTE) SARS-CoV-2 target nucleic acids are NOT DETECTED.  The SARS-CoV-2 RNA is generally detectable in upper respiratory specimens during the acute phase of infection. The lowest concentration of SARS-CoV-2 viral copies this assay can detect is 138 copies/mL. A negative result does not preclude SARS-Cov-2 infection and should not be used as the sole basis for treatment or other patient management decisions. A negative result may occur with  improper specimen collection/handling, submission of specimen other than nasopharyngeal swab,  presence of viral mutation(s) within the areas targeted by this assay, and inadequate number of viral copies(<138 copies/mL). A negative result must be combined with clinical observations, patient history, and epidemiological information. The expected result is Negative.  Fact Sheet for Patients:  EntrepreneurPulse.com.au  Fact Sheet for Healthcare Providers:  IncredibleEmployment.be  This test is no t yet approved or cleared by the [place] FDA and  has been authorized for detection and/or diagnosis of SARS-CoV-2 by FDA under an Emergency Use Authorization (EUA). This EUA will remain  in effect (meaning this test can be used) for the duration of the COVID-19 declaration under  Section 564(b)(1) of the Act, 21 U.S.C.section 360bbb-3(b)(1), unless the authorization is terminated  or revoked sooner.       Influenza A by PCR NEGATIVE NEGATIVE Final   Influenza B by PCR NEGATIVE NEGATIVE Final    Comment: (NOTE) The Xpert Xpress SARS-CoV-2/FLU/RSV plus assay is intended as an aid in the diagnosis of influenza from Nasopharyngeal swab specimens and should not be used as a sole basis for treatment. Nasal washings and aspirates are unacceptable for Xpert Xpress SARS-CoV-2/FLU/RSV testing.  Fact Sheet for Patients: EntrepreneurPulse.com.au  Fact Sheet for Healthcare Providers: IncredibleEmployment.be  This test is not yet approved or cleared by the [place] FDA and has been authorized for detection and/or diagnosis of SARS-CoV-2 by FDA under an Emergency Use Authorization (EUA). This EUA will remain in effect (meaning this test can be used) for the duration of the COVID-19 declaration under Section 564(b)(1) of the Act, 21 U.S.C. section 360bbb-3(b)(1), unless the authorization is terminated or revoked.  Performed at [facility], [place] [address], [place], [place] [address]     Lab Basic  Metabolic Panel: Recent Labs  Lab 06/18/2020 2143 06/08/2020 2257 06/13/20 0045  NA 138 138 139  K 4.0 3.8 4.1  CL 100 102 102  CO2 19*  --  19*  GLUCOSE 112* 106* 111*  BUN 35* 38* 35*  CREATININE 3.26* 3.00* 3.32*  CALCIUM 8.8*  --  8.6*  MG  --   --  1.9   Liver Function Tests: Recent Labs  Lab 05/20/2020 2143 06/13/20 0045  AST 45* 45*  ALT 22 21  ALKPHOS 47 45  BILITOT 3.9* 3.5*  PROT 5.7* 5.3*  ALBUMIN 2.6* 2.4*   Recent Labs  Lab 06/14/2020 2143  LIPASE 26   CBC: Recent Labs  Lab 06/11/2020 2143 05/29/2020 2257 06/13/20 0045  WBC 9.1  --  8.8  NEUTROABS 7.2  --   --   HGB 13.0 15.0 12.2  HCT 41.5 44.0 38.8  MCV 95.2  --  94.6  PLT 112*  --  107*   Sepsis Labs: Recent Labs  Lab 06/03/2020 2143 06/13/20 0045  WBC 9.1 8.8      Guillermo Difrancesco 06/18/2020, 7:09 AM

## 2020-06-19 NOTE — Progress Notes (Addendum)
Pt has expired on 06-17-2020 at 23:35. Death pronounced by RNs [facility] and [place]). Family notified. Attending MD to complete death certificate in the morning.

## 2020-06-19 DEATH — deceased

## 2020-06-27 ENCOUNTER — Ambulatory Visit: Payer: PPO | Admitting: General Practice

## 2020-07-25 IMAGING — DX PORTABLE PELVIS 1-2 VIEWS
1 series · 1 of 1 positions shown · non-contrast
Comparison: None.

CLINICAL DATA: Pelvic pain after fall today.  Patient found down.

EXAM:
PORTABLE PELVIS 1-2 VIEWS

[pelvis]
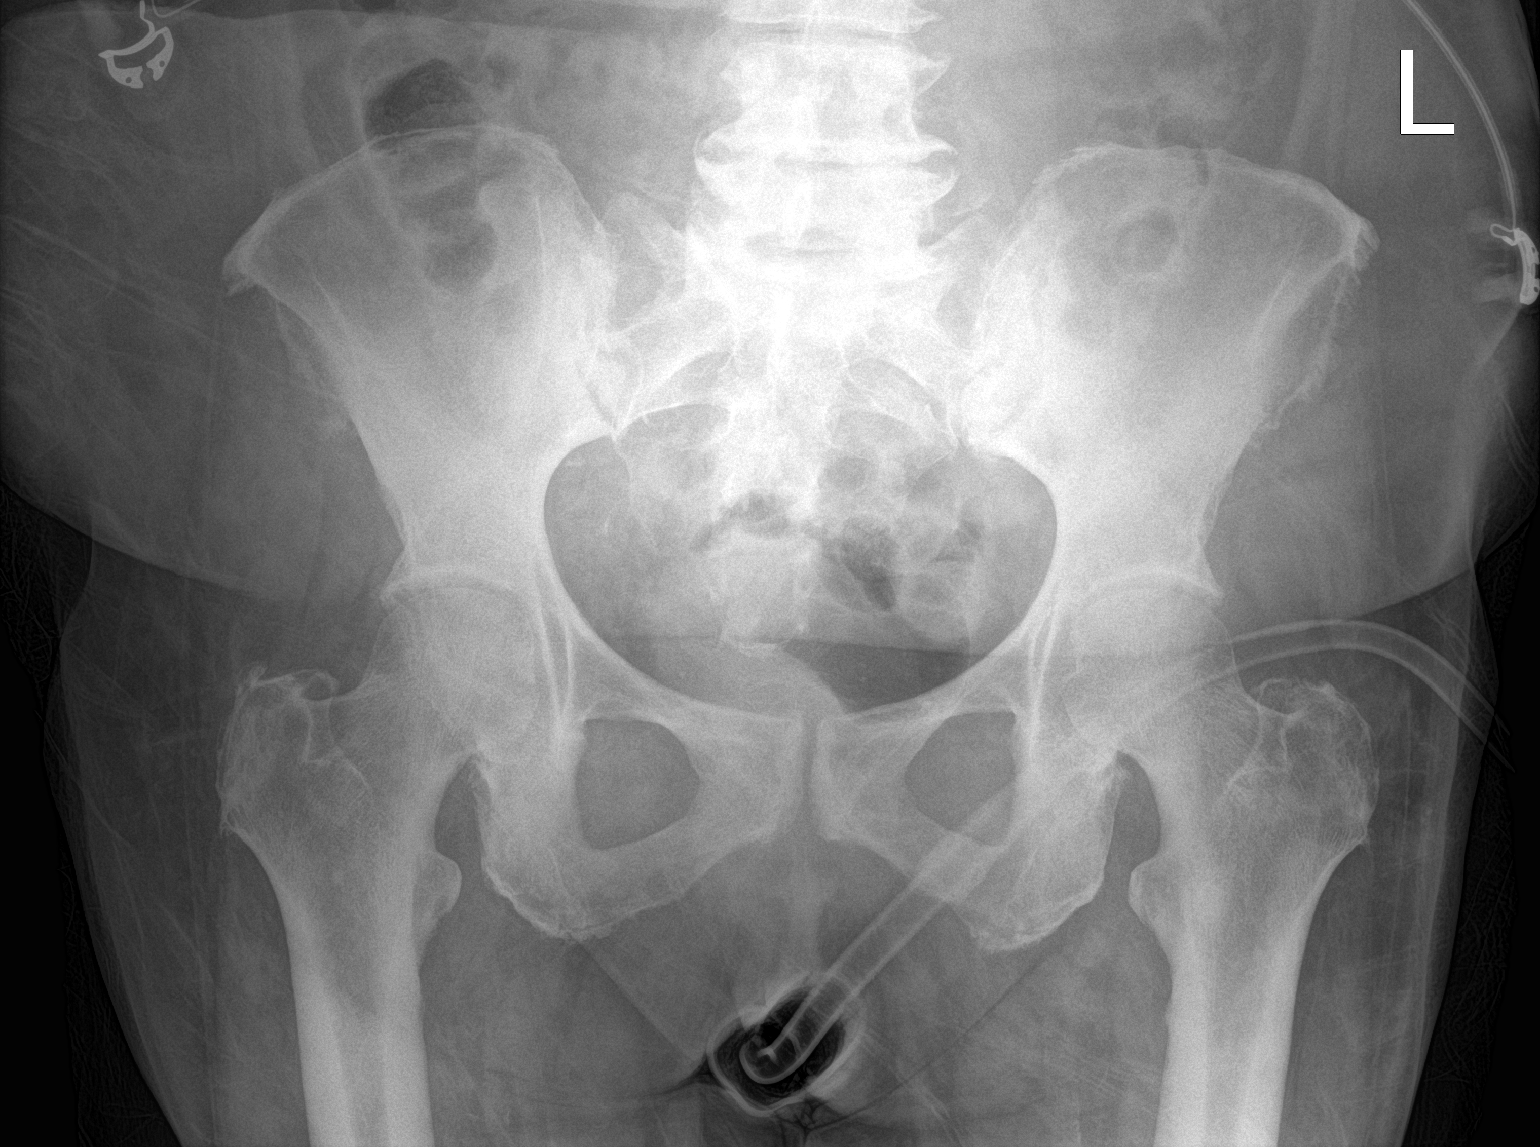

[1 of 1 positions shown; findings below may reference images not displayed]

FINDINGS: There is no evidence of pelvic fracture or diastasis. No pelvic bone
lesions are seen. Lower lumbar degenerative disease noted.
IMPRESSION: No acute abnormality.

## 2020-08-29 ENCOUNTER — Ambulatory Visit: Payer: PPO | Admitting: Family Medicine

## 2021-11-14 IMAGING — CT CT HEAD W/O CM
4 series · 16 of 47 positions shown, 18 images · non-contrast
Comparison: None.

CLINICAL DATA: Headache COVID positive

EXAM:
CT HEAD WITHOUT CONTRAST
TECHNIQUE: Contiguous axial images were obtained from the base of the skull
through the vertex without intravenous contrast.

[Series 3: head without · axial · non-contrast · 0.42mm/px · z∈[+56,+176]mm · 7 of 33 slices shown, 9 images]
[im 5/33  brain]
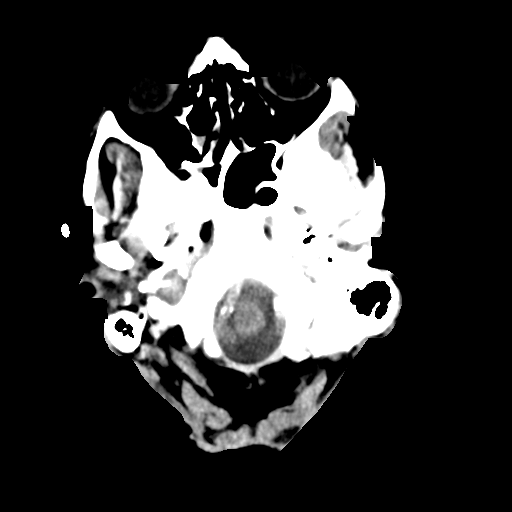
[im 5/33  bone]
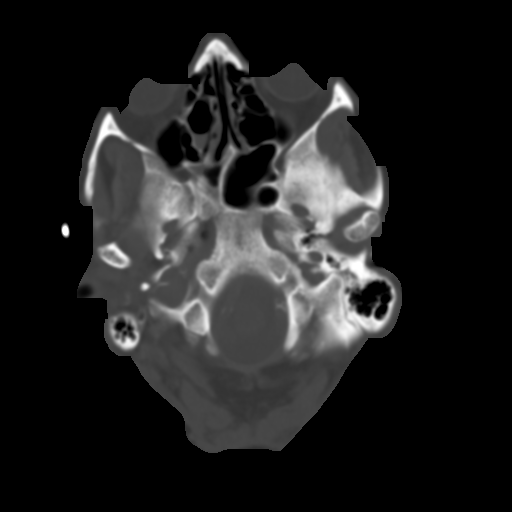
[im 9/33  brain]
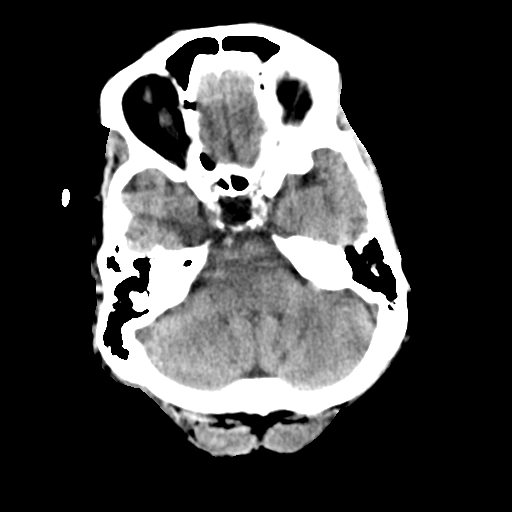
[im 13/33  brain]
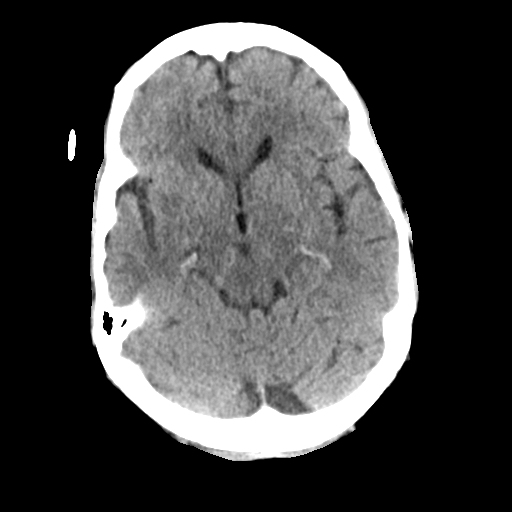
[im 17/33  brain]
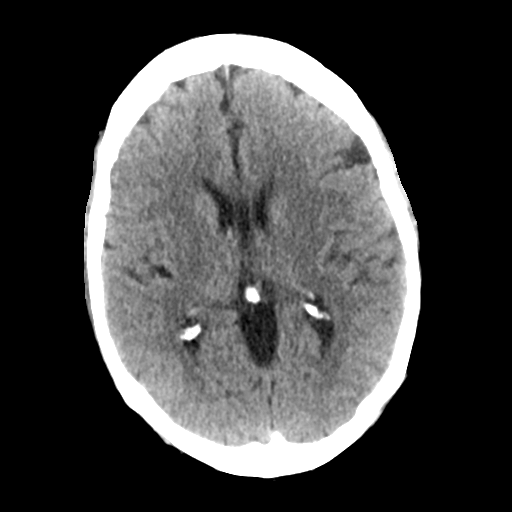
[im 21/33  brain]
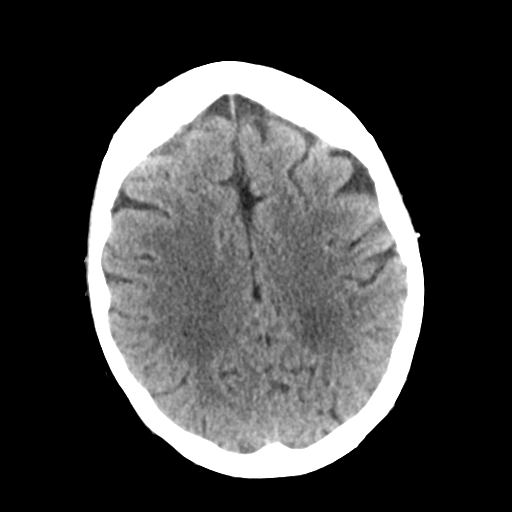
[im 21/33  bone]
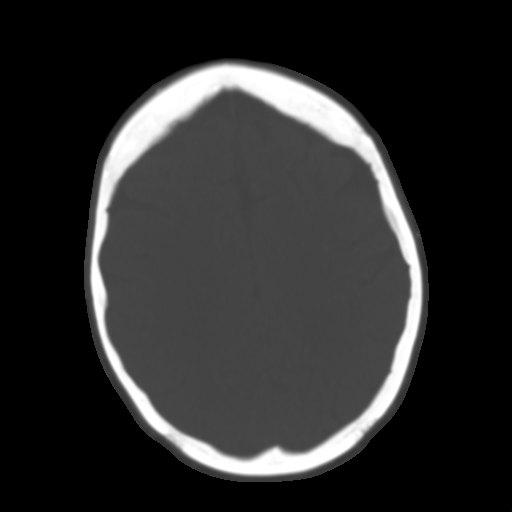
[im 25/33  brain]
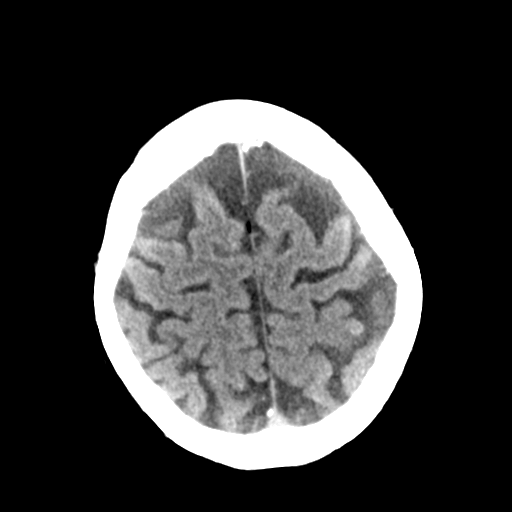
[im 29/33  brain]
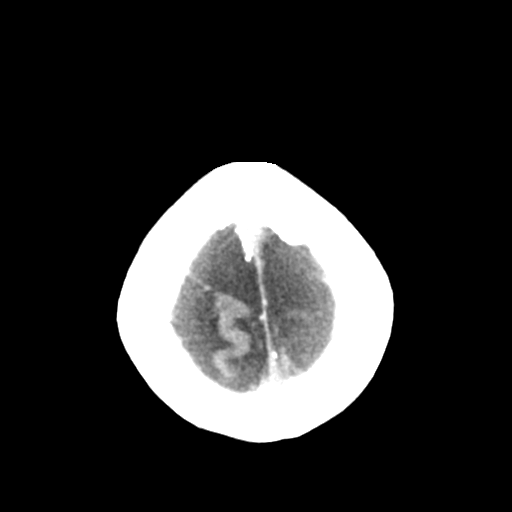

[Series 4: head bone · axial · 0.42mm/px · z∈[+52,+84]mm · 3 of 83 slices shown]
[im 9/83  bone]
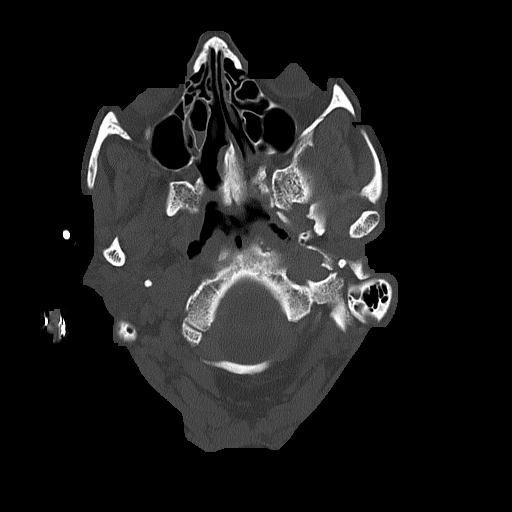
[im 17/83  bone]
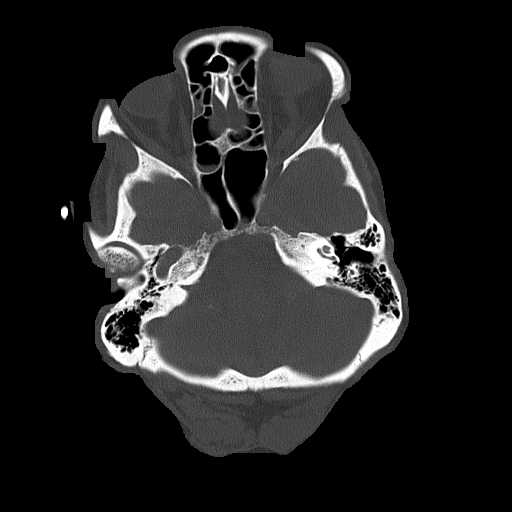
[im 25/83  bone]
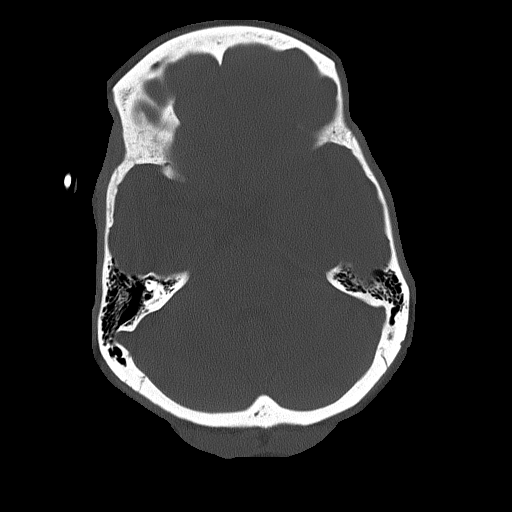

[Series 5: head without cor · coronal · non-contrast · 0.28mm/px · 3 of 67 slices shown]
[im 23/67  brain]
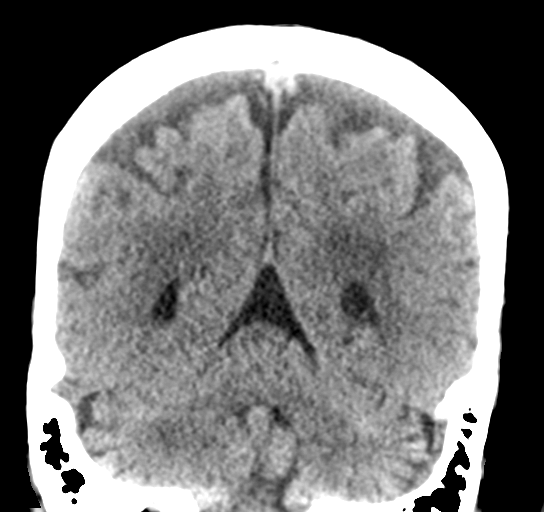
[im 30/67  brain]
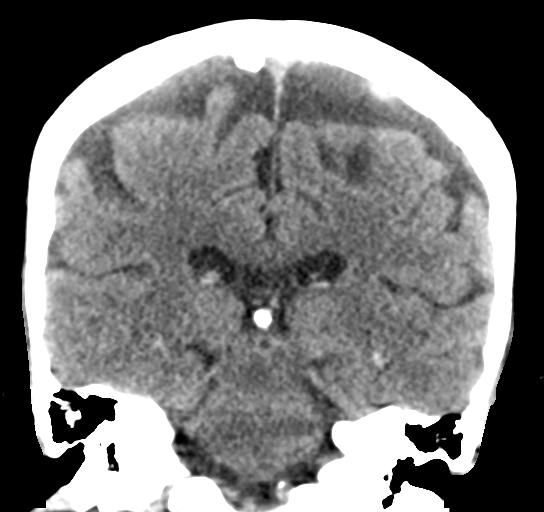
[im 37/67  brain]
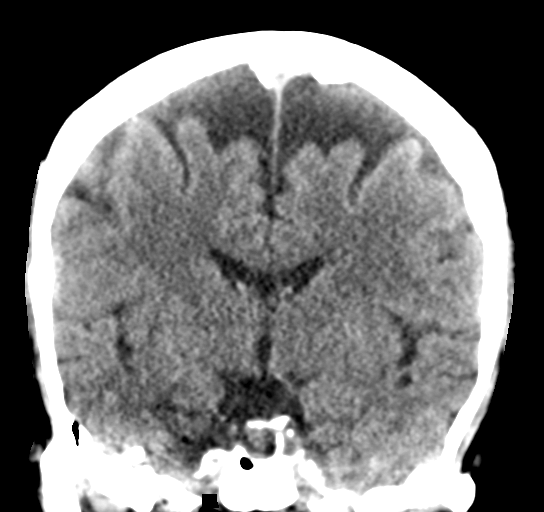

[Series 6: head without sag · sagittal · non-contrast · 0.29mm/px · 3 of 52 slices shown]
[im 18/52  brain]
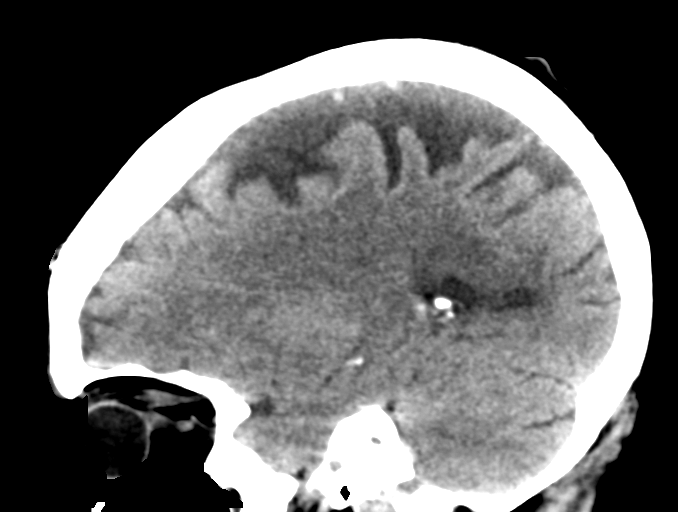
[im 26/52  brain]
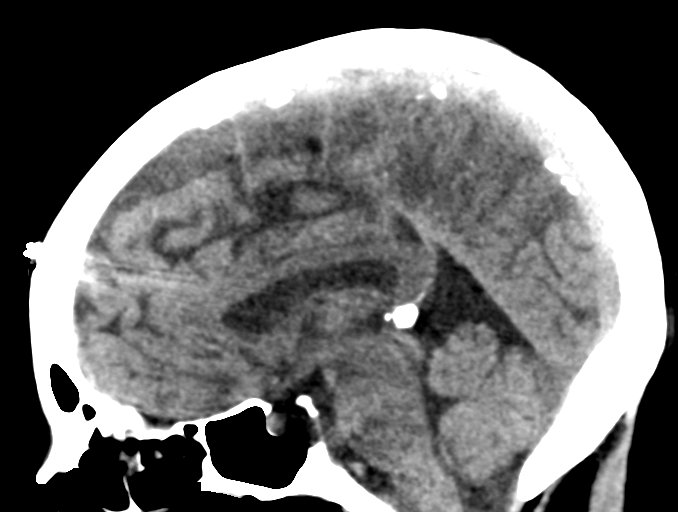
[im 35/52  brain]
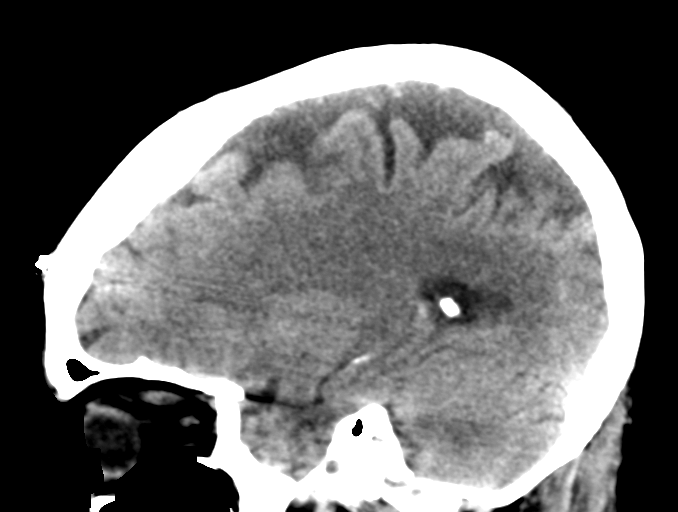

[16 of 47 positions shown; findings below may reference images not displayed]

FINDINGS: Brain: No evidence of acute territorial infarction, hemorrhage,
hydrocephalus,extra-axial collection or mass lesion/mass effect.
There is dilatation the ventricles and sulci consistent with
age-related atrophy. Low-attenuation changes in the deep white
matter consistent with small vessel ischemia.

Vascular: No hyperdense vessel or unexpected calcification.

Skull: The skull is intact. No fracture or focal lesion identified.

Sinuses/Orbits: The visualized paranasal sinuses and mastoid air
cells are clear. The orbits and globes intact.

Other: None
IMPRESSION: No acute intracranial abnormality.

Findings consistent with age related atrophy and chronic small
vessel ischemia

## 2022-03-28 IMAGING — CR DG HIP (WITH OR WITHOUT PELVIS) 2-3V*L*
3 series · 3 of 3 positions shown · non-contrast
Comparison: None.

CLINICAL DATA: Lateral hip pain post fall 1 day ago at home. No
history of prior injuries or surgeries. Reported pain on the LEFT.

EXAM:
DG HIP (WITH OR WITHOUT PELVIS) 2-3V LEFT

[pelvis ap]
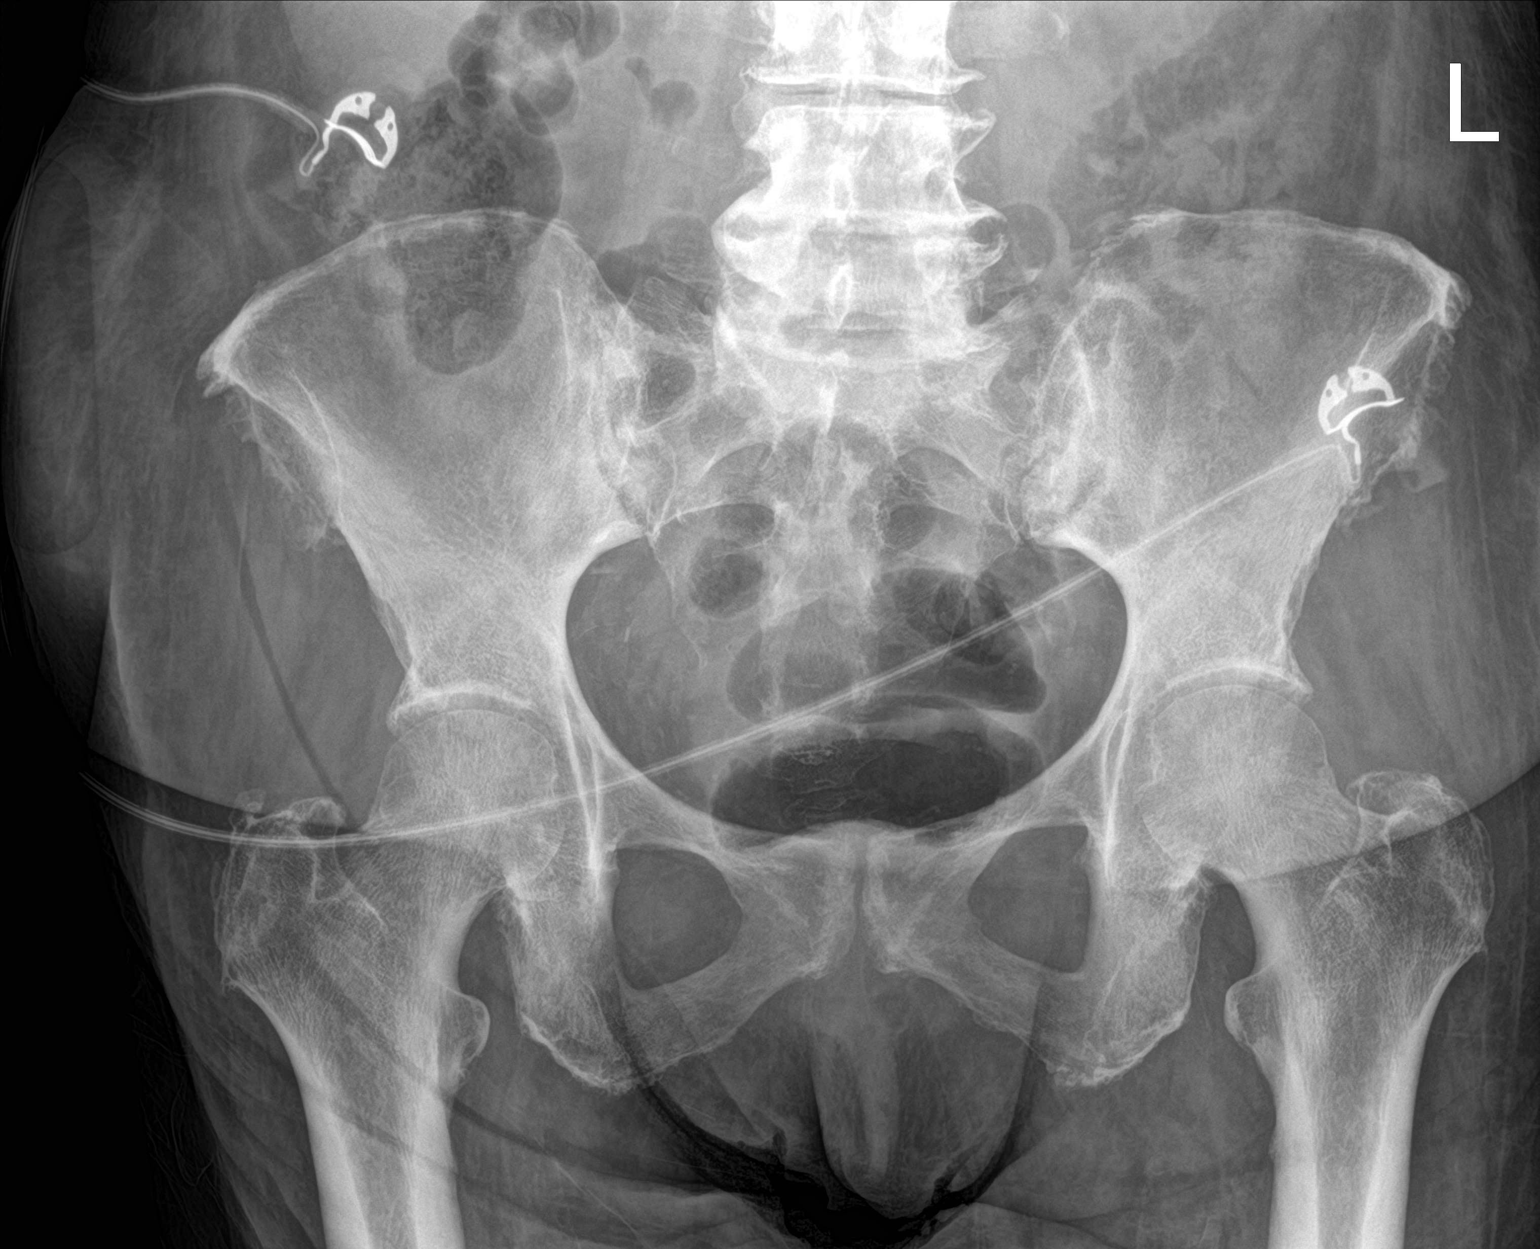

[hip ap]
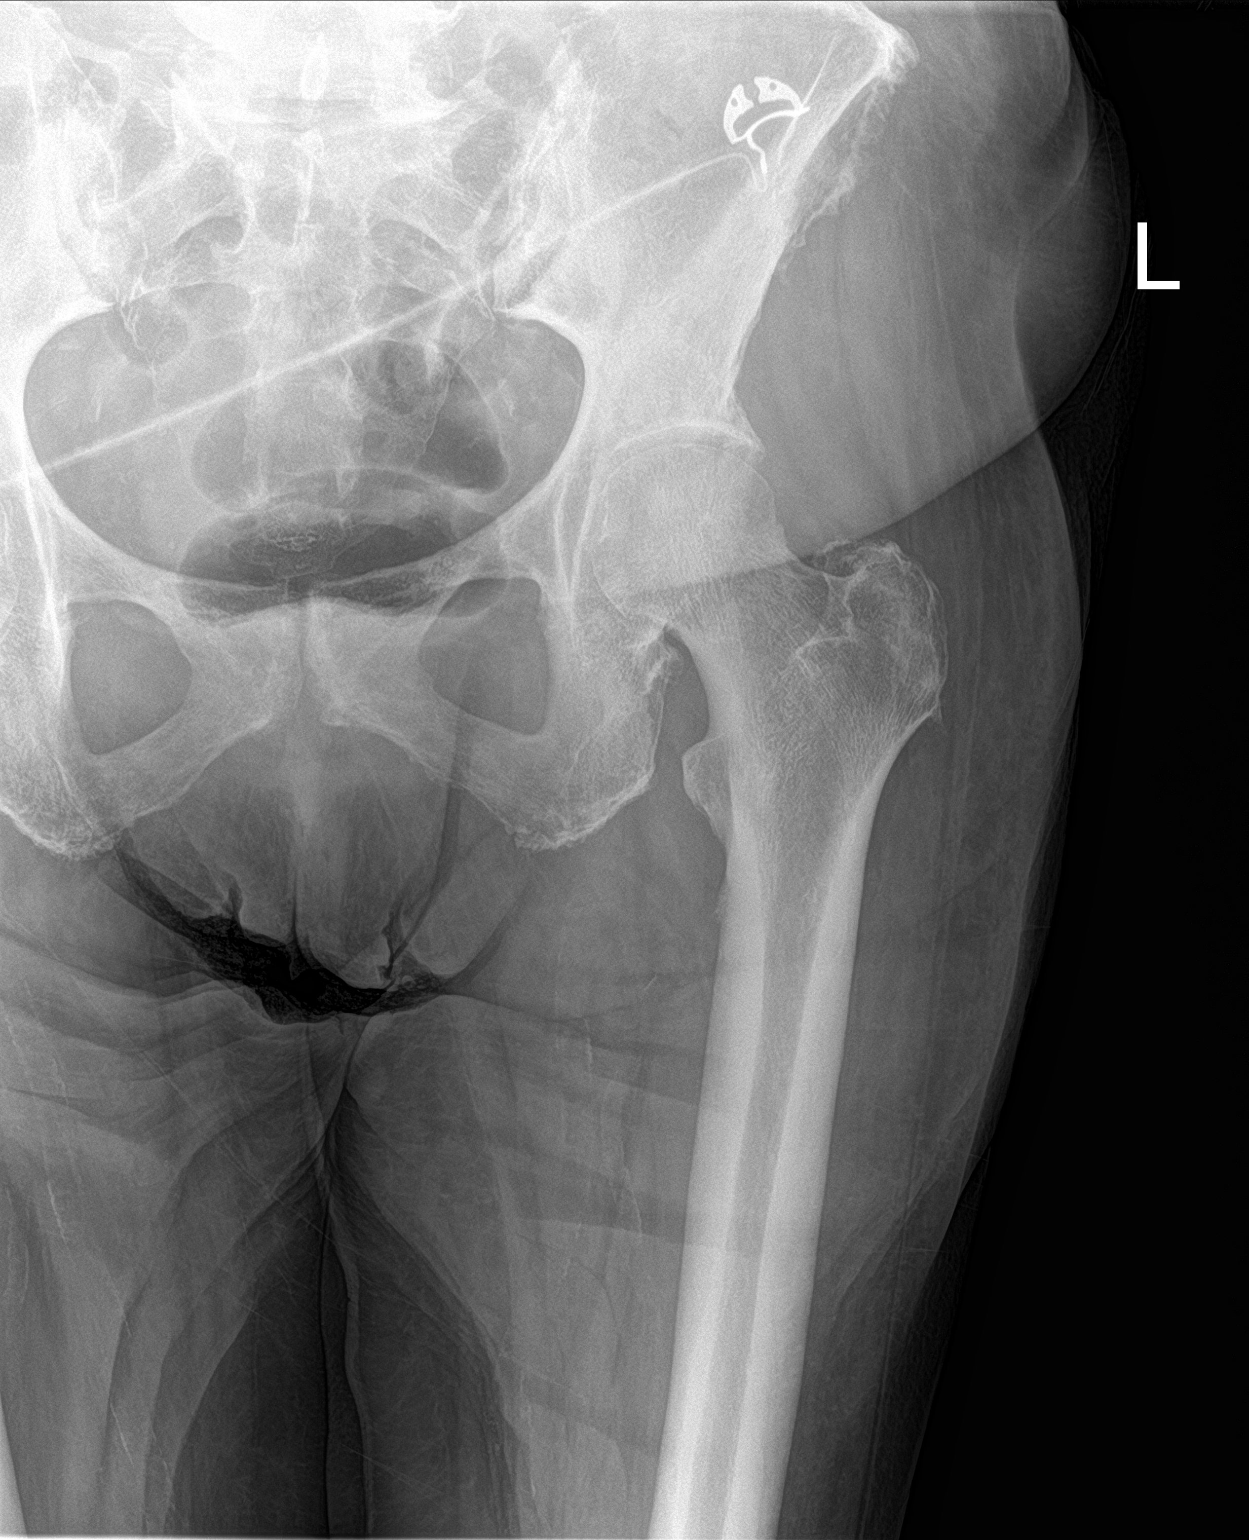

[hip lat]
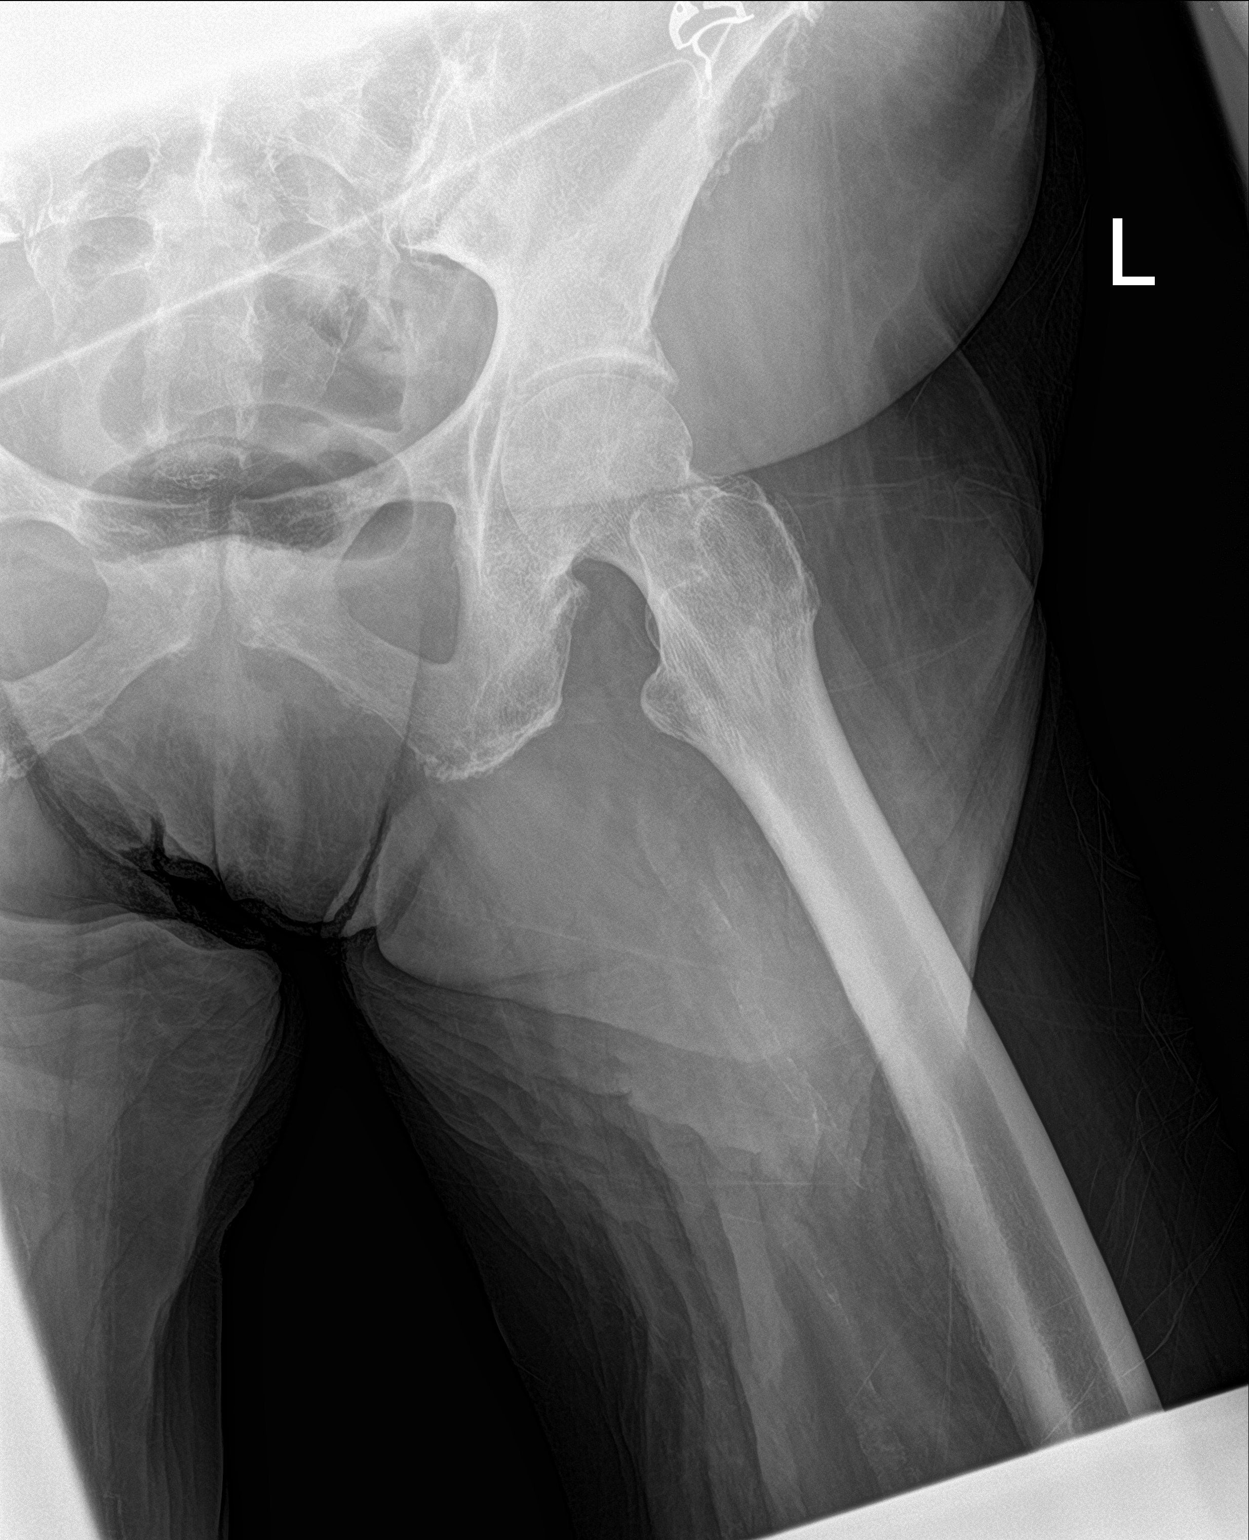

[3 of 3 positions shown; findings below may reference images not displayed]

FINDINGS: No sign of fracture about the bony pelvis.  Enthesopathy.

Mild irregularity at the lateral margin of the femoral head seen
only on one view on the LEFT. No sign of displacement. LEFT hip is
located.
IMPRESSION: Mild irregularity at the lateral margin of the LEFT femoral head
seen only on one view. This could represent a subtle nondisplaced
subcapital fracture.
